# Patient Record
Sex: Female | Born: 1955 | Race: White | Hispanic: No | Marital: Married | Smoking: Never smoker
Health system: Southern US, Community
[De-identification: ages and names within clinical notes are randomized; demographics above are authoritative.]

## PROBLEM LIST (undated history)

## (undated) DIAGNOSIS — F319 Bipolar disorder, unspecified: Secondary | ICD-10-CM

## (undated) DIAGNOSIS — N189 Chronic kidney disease, unspecified: Secondary | ICD-10-CM

## (undated) DIAGNOSIS — F3010 Manic episode without psychotic symptoms, unspecified: Secondary | ICD-10-CM

## (undated) DIAGNOSIS — E119 Type 2 diabetes mellitus without complications: Secondary | ICD-10-CM

## (undated) DIAGNOSIS — E7800 Pure hypercholesterolemia, unspecified: Secondary | ICD-10-CM

---

## 2004-03-14 ENCOUNTER — Other Ambulatory Visit: Payer: Self-pay

## 2004-03-14 ENCOUNTER — Inpatient Hospital Stay: Payer: Self-pay | Admitting: Internal Medicine

## 2005-02-03 ENCOUNTER — Ambulatory Visit: Payer: Self-pay | Admitting: Internal Medicine

## 2005-02-26 ENCOUNTER — Ambulatory Visit: Payer: Self-pay | Admitting: Internal Medicine

## 2006-03-11 ENCOUNTER — Ambulatory Visit: Payer: Self-pay | Admitting: Internal Medicine

## 2007-03-23 ENCOUNTER — Ambulatory Visit: Payer: Self-pay | Admitting: Internal Medicine

## 2007-06-30 ENCOUNTER — Ambulatory Visit: Payer: Self-pay | Admitting: Urology

## 2007-12-16 ENCOUNTER — Ambulatory Visit: Payer: Self-pay | Admitting: Urology

## 2008-03-28 ENCOUNTER — Ambulatory Visit: Payer: Self-pay | Admitting: Internal Medicine

## 2008-09-06 ENCOUNTER — Ambulatory Visit: Payer: Self-pay | Admitting: Urology

## 2009-06-26 ENCOUNTER — Ambulatory Visit: Payer: Self-pay | Admitting: Internal Medicine

## 2009-10-10 ENCOUNTER — Ambulatory Visit: Payer: Self-pay | Admitting: Urology

## 2009-12-24 IMAGING — CT CT ABDOMEN AND PELVIS WITHOUT AND WITH CONTRAST
2 of 4 series · 14 of 32 positions shown, 19 images · non-contrast
Comparison: none

REASON FOR EXAM: Chronic cystitis, hematuria, renal colic
COMMENTS:

PROCEDURE:     CT  - CT ABDOMEN / PELVIS  W/WO  - June 30, 2007  [DATE]
RESULT:     The patient has a history of LEFT flank pain, cystitis and
hematuria.
TECHNIQUE: IV and oral contrast enhanced CT of abdomen and pelvis is
obtained.
There are no prior studies available for comparison.

[Series 4: soft tissue with · axial · 0.65mm/px · z∈[-902,-576]mm · 8 of 85 slices shown, 13 images]
[im 10/85  soft-tissue]
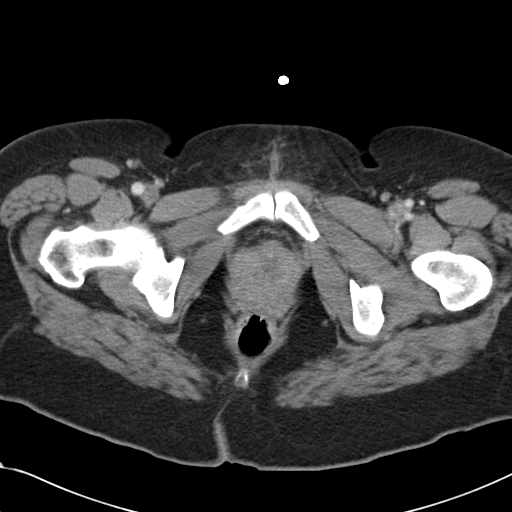
[im 10/85  bone]
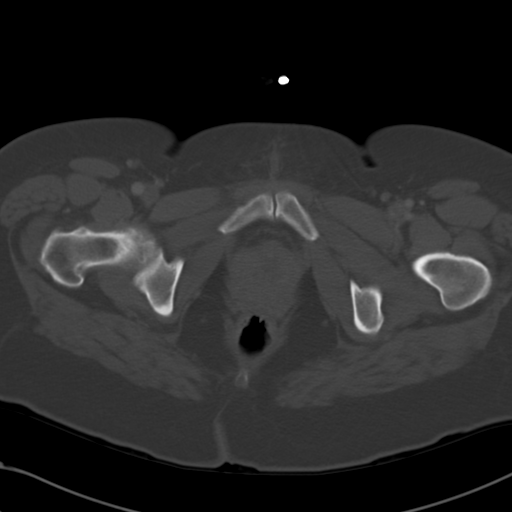
[im 19/85  soft-tissue]
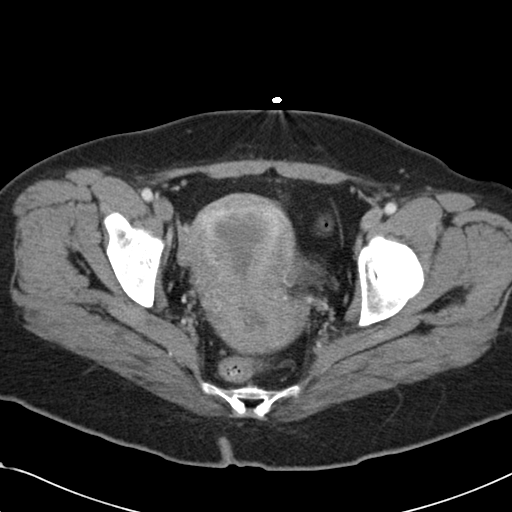
[im 29/85  soft-tissue]
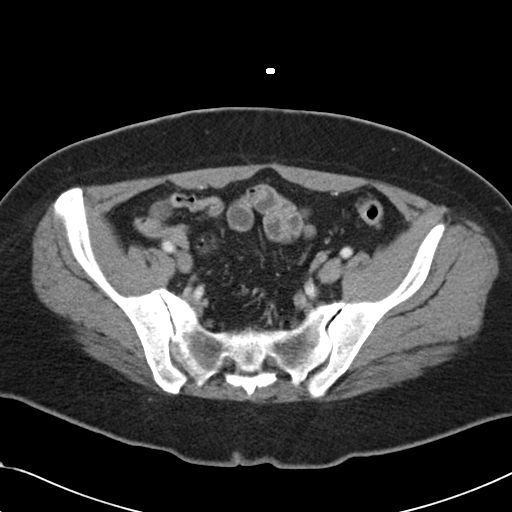
[im 38/85  soft-tissue]
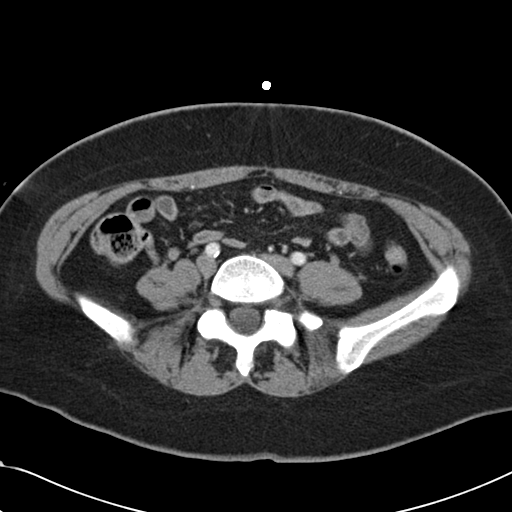
[im 47/85  soft-tissue]
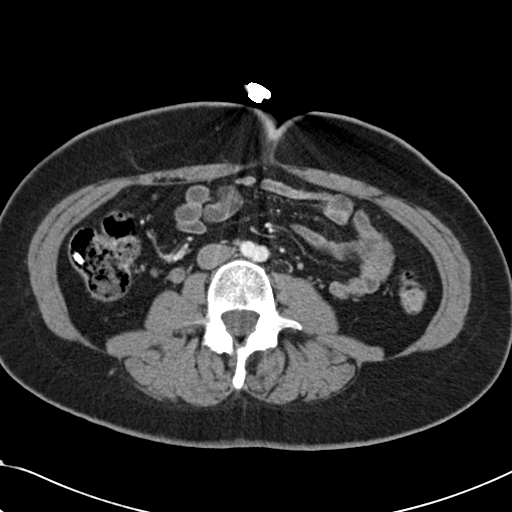
[im 47/85  lung]
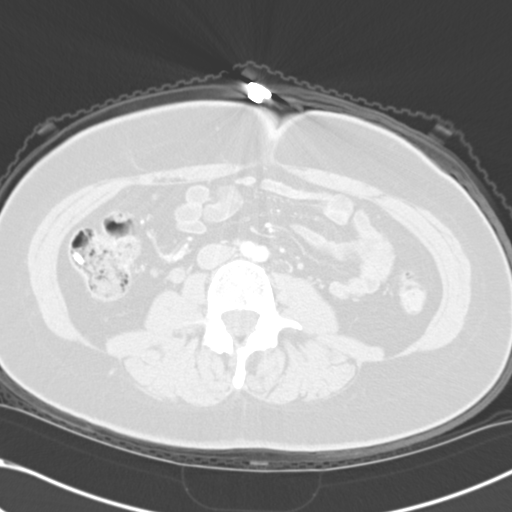
[im 57/85  soft-tissue]
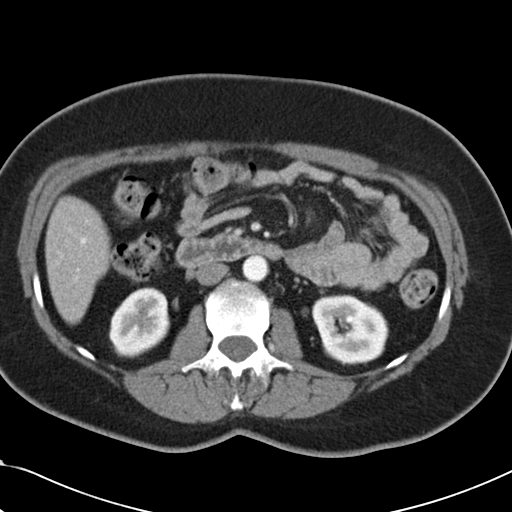
[im 57/85  lung]
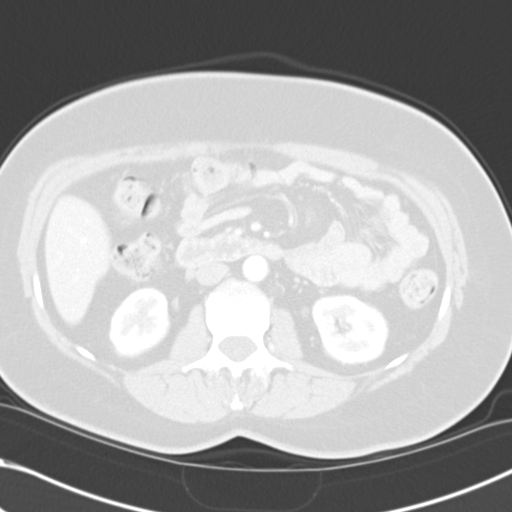
[im 66/85  soft-tissue]
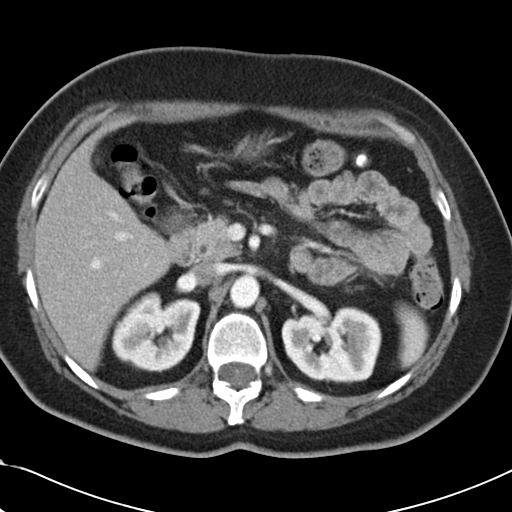
[im 66/85  lung]
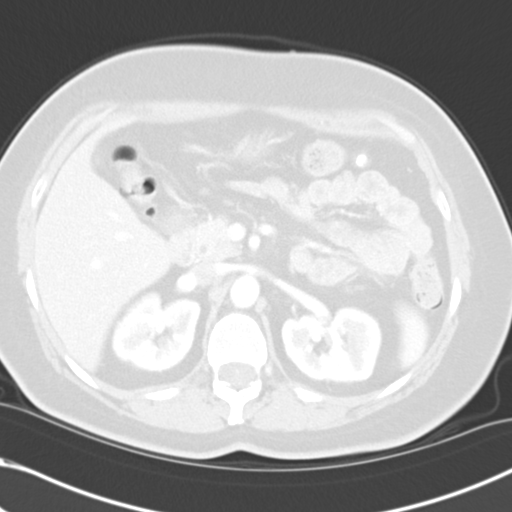
[im 75/85  soft-tissue]
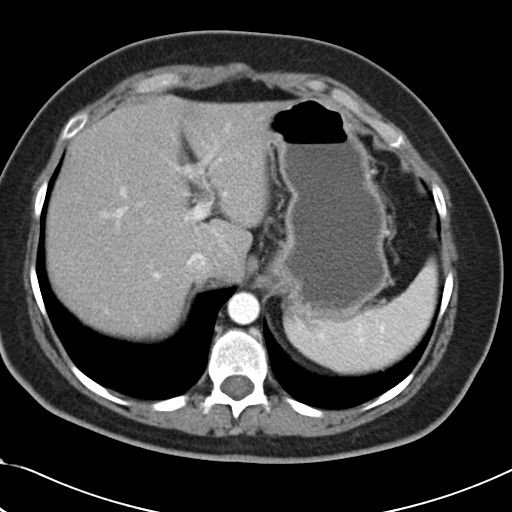
[im 75/85  lung]
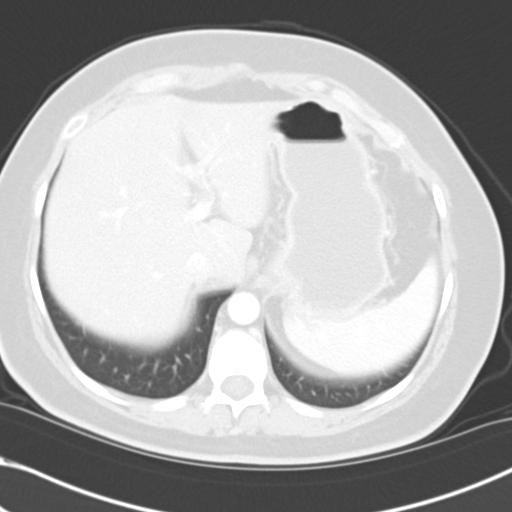

[Series 6: soft tissue delay · axial · delayed · 0.65mm/px · z∈[-902,-666]mm · 6 of 85 slices shown]
[im 10/85  soft-tissue]
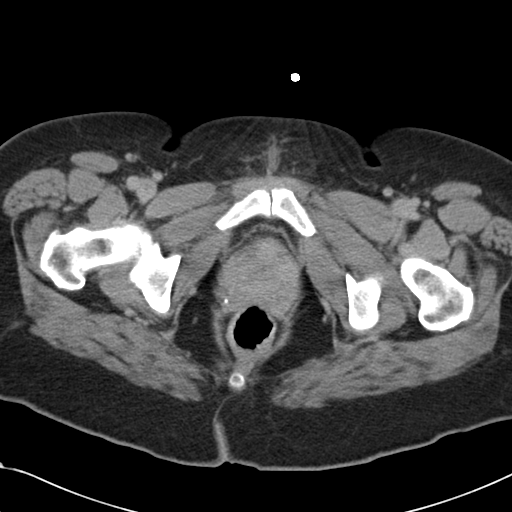
[im 19/85  soft-tissue]
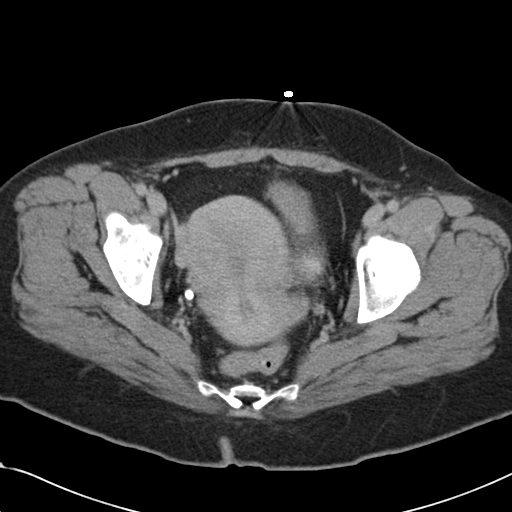
[im 29/85  soft-tissue]
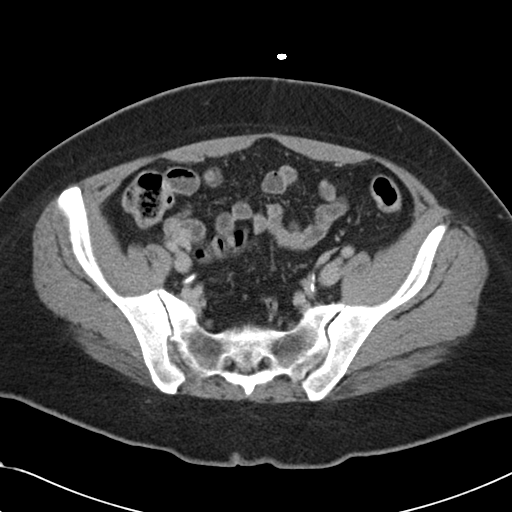
[im 38/85  soft-tissue]
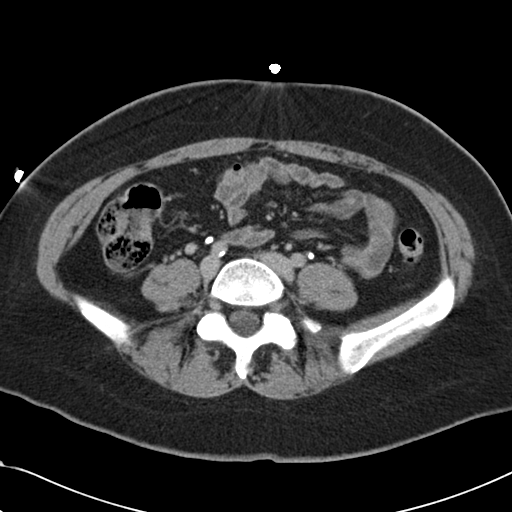
[im 47/85  soft-tissue]
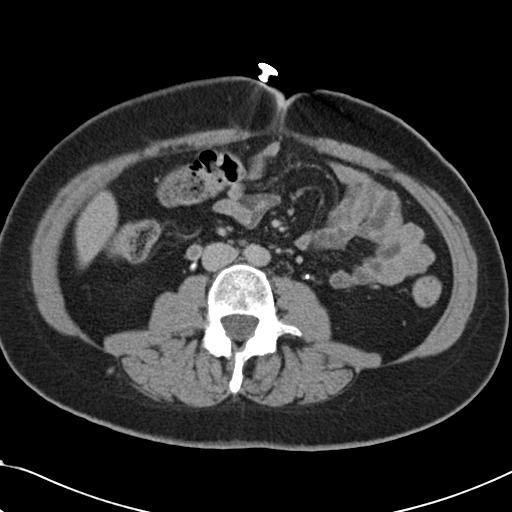
[im 57/85  soft-tissue]
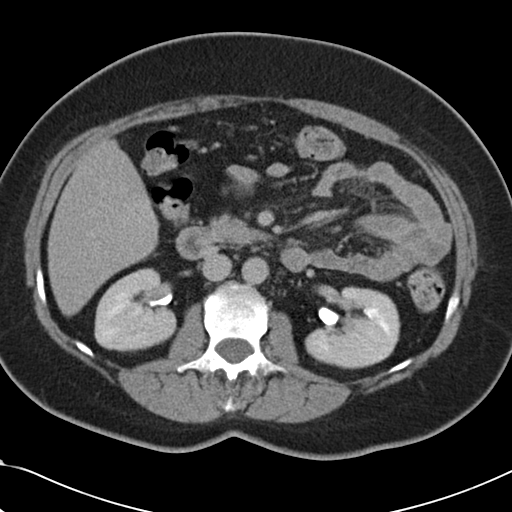

[14 of 32 positions shown; findings below may reference images not displayed]

FINDINGS: The liver and spleen are normal. The pancreas is normal. The
adrenals are normal. The kidneys are normal. There is no bowel distension.
The appendix is normal. Tiny, renal stones are present. There is no evidence
of obstructing ureteral stone. Calcified pelvic phleboliths are noted. The
cervix is noted. Fluid is noted within the uterus. Clinical evaluation is
suggested. Pelvic Ultrasound may prove useful. The lung bases are clear. No
free air is noted.
IMPRESSION: 1.     LEFT nephrolithiasis. Tiny stones are noted in the LEFT renal
collecting system.
2.     No focal renal parenchymal abnormality is identified.
3.     A tiny cyst is noted in the anterior aspect of the LEFT kidney.
4.     Enlargement of the uterus and cervix. Clinical correlation is
suggested. Pelvic Ultrasound can be obtained for further evaluation.
Clinical exam to evaluate for cervical cancer is suggested.
5.     The bladder is unremarkable.

## 2010-12-08 ENCOUNTER — Inpatient Hospital Stay: Payer: Self-pay | Admitting: Psychiatry

## 2011-03-03 IMAGING — CR DG ABDOMEN 1V
1 series · 1 of 1 positions shown · non-contrast
Comparison: none

REASON FOR EXAM: nephrolithasis pt need  pt need films
COMMENTS:

[view not recorded]
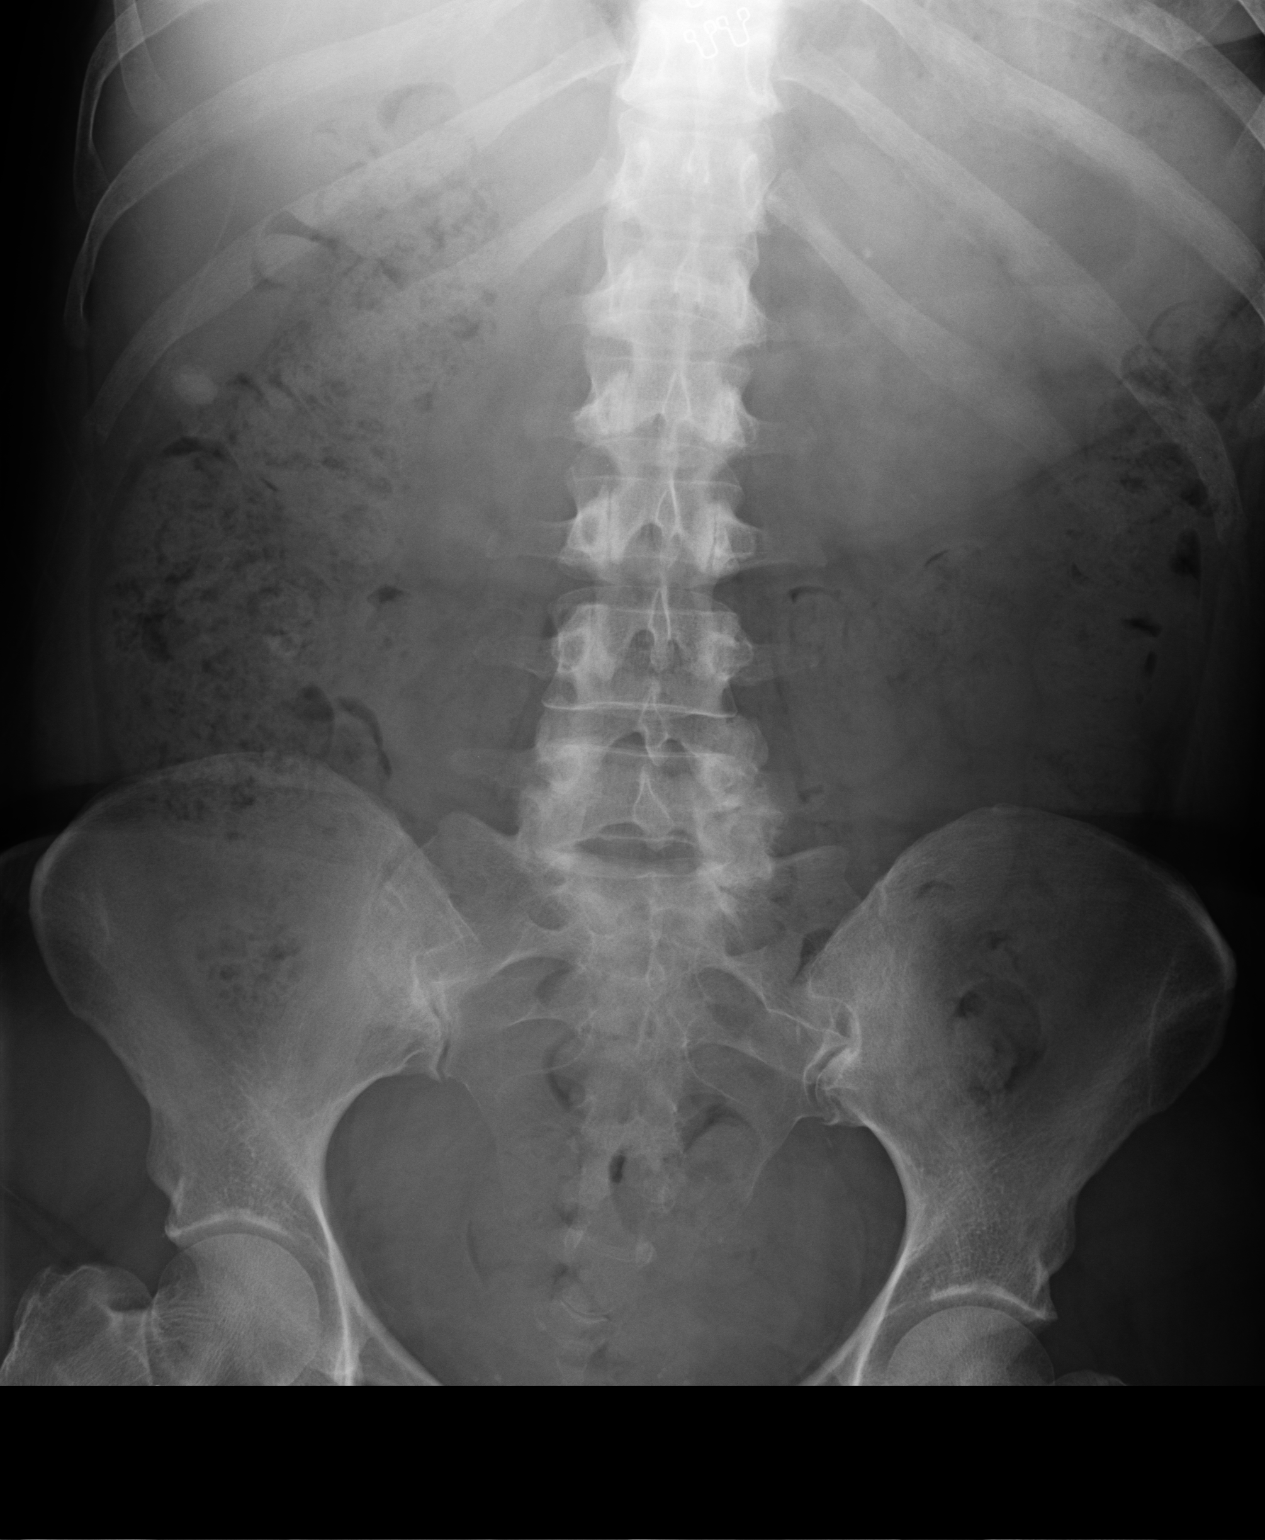

[1 of 1 positions shown; findings below may reference images not displayed]

PROCEDURE:     DXR - DXR KIDNEY URETER BLADDER  - September 06, 2008  [DATE]

RESULT:     An AP view of the abdomen was obtained and is compared to the
prior exam of 12/16/2007. There is again observed a 3 mm calcification
projected over the upper pole of the left kidney. Additional small left
renal stones were noted at prior CT and are not definitely identified by
routine radiography. No definite ureteral stones are identified. Phleboliths
are noted in the pelvis.
IMPRESSION: Left nephrolithiasis.

## 2012-01-05 ENCOUNTER — Ambulatory Visit: Payer: Self-pay | Admitting: Internal Medicine

## 2012-04-05 IMAGING — CR DG ABDOMEN 1V
1 series · 1 of 1 positions shown · non-contrast
Comparison: none

REASON FOR EXAM: Nephroklithiasis pt need films
COMMENTS:

[view not recorded]
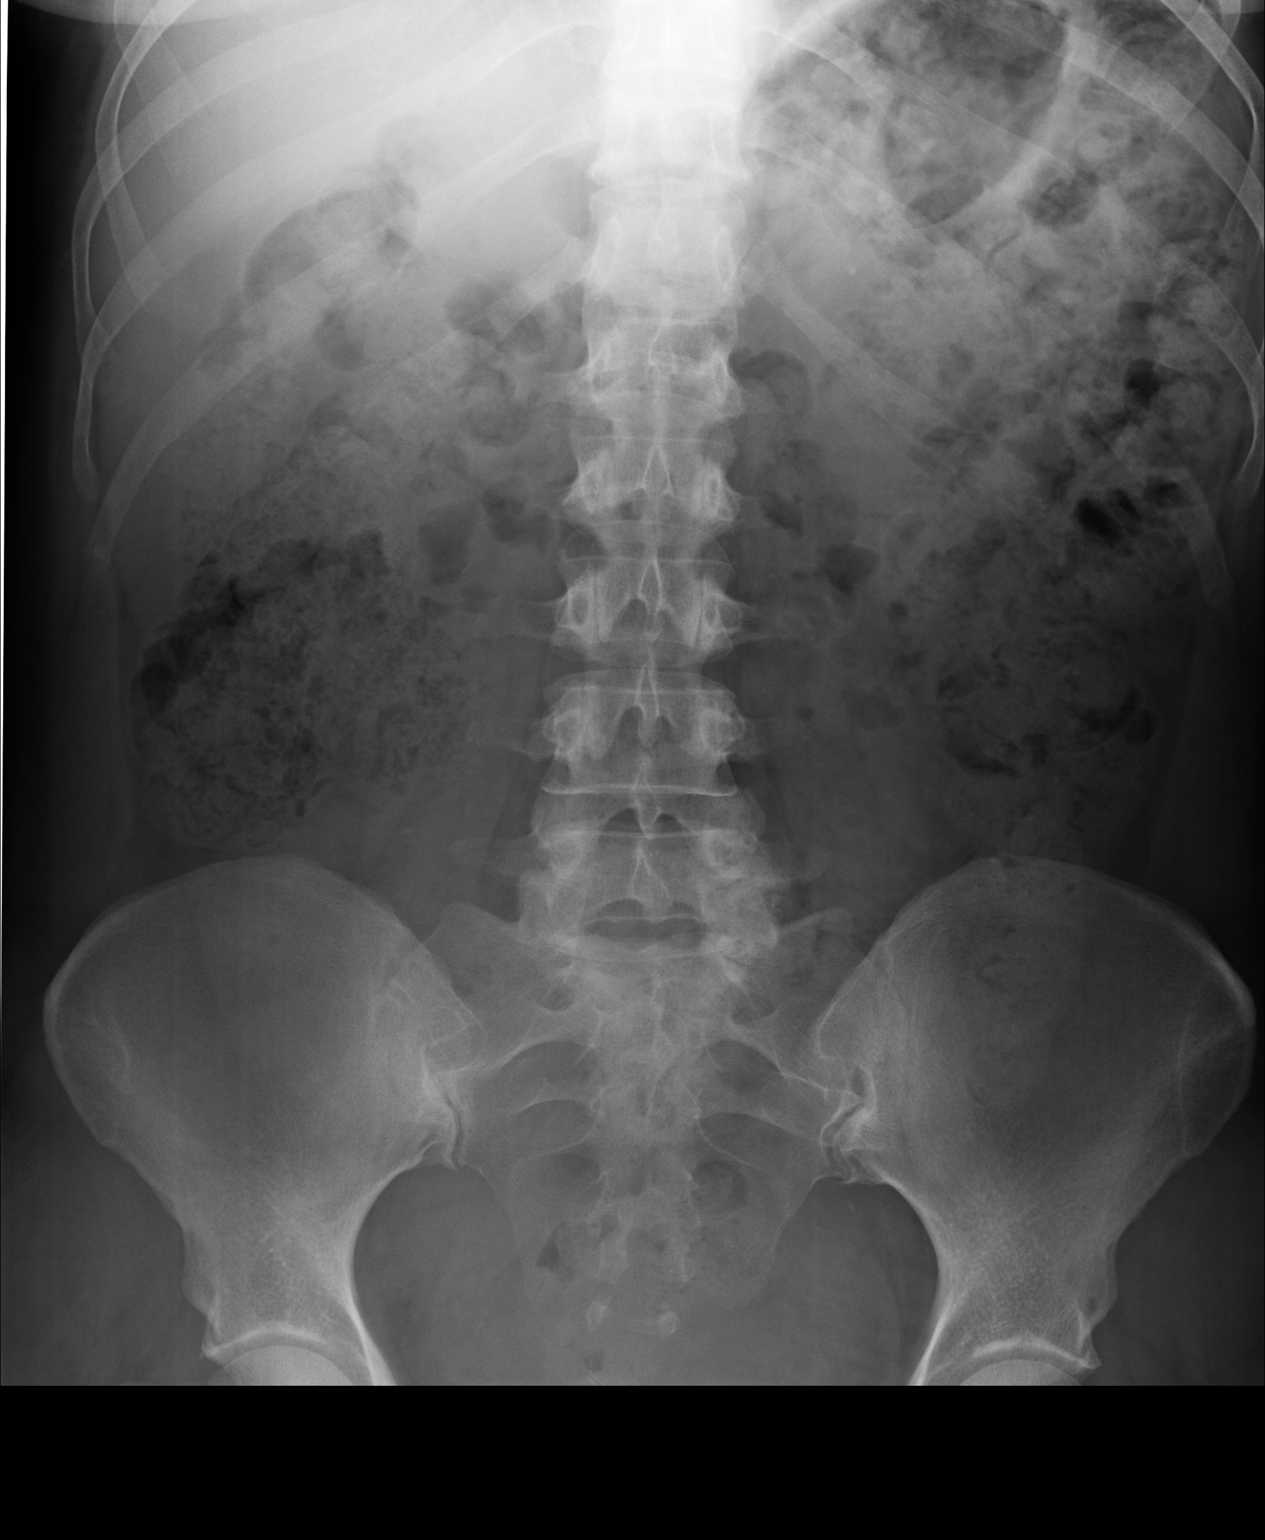

[1 of 1 positions shown; findings below may reference images not displayed]

PROCEDURE:     DXR - DXR KIDNEY URETER BLADDER  - October 10, 2009  [DATE]

RESULT:     There is a 2 mm calcification at the upper pole of the left
kidney consistent with a left renal stone and unchanged in position as
compared to the prior exam. No definite right renal stones are seen. No
ureteral calcifications are identified. Note is made that the right kidney
is in great part obscured by bowel and bowel content.
IMPRESSION: Left nephrolithiasis.

## 2013-01-05 ENCOUNTER — Ambulatory Visit: Payer: Self-pay | Admitting: Internal Medicine

## 2013-08-02 LAB — DRUG SCREEN, URINE

## 2013-08-02 LAB — CBC
HCT: 39.2 % (ref 35.0–47.0)
HGB: 13.5 g/dL (ref 12.0–16.0)
MCH: 33.8 pg (ref 26.0–34.0)
MCHC: 34.5 g/dL (ref 32.0–36.0)
MCV: 98 fL (ref 80–100)
Platelet: 277 10*3/uL (ref 150–440)
RBC: 4 10*6/uL (ref 3.80–5.20)
RDW: 12.3 % (ref 11.5–14.5)
WBC: 6.8 10*3/uL (ref 3.6–11.0)

## 2013-08-02 LAB — COMPREHENSIVE METABOLIC PANEL
Albumin: 3.7 g/dL (ref 3.4–5.0)
Alkaline Phosphatase: 101 U/L
Anion Gap: 9 (ref 7–16)
BILIRUBIN TOTAL: 0.1 mg/dL — AB (ref 0.2–1.0)
BUN: 14 mg/dL (ref 7–18)
CO2: 24 mmol/L (ref 21–32)
Calcium, Total: 9.6 mg/dL (ref 8.5–10.1)
Chloride: 106 mmol/L (ref 98–107)
Creatinine: 0.79 mg/dL (ref 0.60–1.30)
EGFR (African American): >60
EGFR (Non-African Amer.): >60
Glucose: 159 mg/dL — ABNORMAL HIGH (ref 65–99)
OSMOLALITY: 281 (ref 275–301)
Potassium: 4.1 mmol/L (ref 3.5–5.1)
SGOT(AST): 18 U/L (ref 15–37)
SGPT (ALT): 29 U/L (ref 12–78)
SODIUM: 139 mmol/L (ref 136–145)
TOTAL PROTEIN: 7.1 g/dL (ref 6.4–8.2)

## 2013-08-02 LAB — URINALYSIS, COMPLETE
BACTERIA: NONE SEEN
BILIRUBIN, UR: NEGATIVE
BLOOD: NEGATIVE
Glucose,UR: NEGATIVE mg/dL (ref 0–75)
KETONE: NEGATIVE
LEUKOCYTE ESTERASE: NEGATIVE
Nitrite: NEGATIVE
Ph: 5 (ref 4.5–8.0)
Protein: NEGATIVE
Specific Gravity: 1.006 (ref 1.003–1.030)

## 2013-08-02 LAB — ETHANOL
Ethanol %: 0.044 % (ref 0.000–0.080)
Ethanol: 44 mg/dL

## 2013-08-03 ENCOUNTER — Inpatient Hospital Stay: Payer: Self-pay | Admitting: Psychiatry

## 2014-06-26 ENCOUNTER — Ambulatory Visit: Payer: Self-pay | Admitting: Internal Medicine

## 2014-09-15 NOTE — Consult Note (Signed)
PATIENT NAME:  Rita Sullivan, Rita Sullivan MR#:  [id number] DATE OF BIRTH:  1955-09-09  DATE Of evaluation:  08/05/2013    CONSULTING PHYSICIAN:  Rita Sullivan. Rita Bundehalla, MD  PLACE OF DICTATION: [facility], [place].  SUBJECTIVE: The patient was seen in [facility]. The patient reported that she is feeling better. She is able to rest better and feels better and staff report that she has been calmer and cooperative too.  OBJECTIVE: Dressed in hospital scrubs. Alert and oriented, much calmer, pleasant and cooperative, able to focus much better. Speech is normal in rate. Denies feeling depressed and said "I am feeling fine, I am happy, that is what I am always". Smiling while talking about the same. She is planning to call her family members and talk to them and  she plans to do at the time of discharge and calls her husband a mean man and does want to go back home. Denies responding to any internal stimuli. Denies hearing voices saying things. Denies any paranoid or suspicious ideas. Denied any suicidal or homicidal plans.  Insight and judgment guarded.   IMPRESSION: Bipolar disorder, manic, and current episode being hypomanic.   PLAN: Continue current medications as the patient has been responding and feeling better. Supportive therapy and coping skills discussed. The patient plans to call her family and try to plan for discharge, as she does not want to go back to her husband.  ____________________________ Rita MantisSurya Sullivan. Rita Bundehalla, MD skc:sg D: 08/05/2013 22:23:36 ET T: 08/06/2013 13:32:23 ET JOB#: [id number]  cc: Rita SalkSurya Sullivan. Rita Bundehalla, MD, <Dictator> Rita FannySURYA Sullivan Rita Fortson MD ELECTRONICALLY SIGNED 08/06/2013 20:02

## 2014-09-15 NOTE — H&P (Signed)
PATIENT NAME:  Rita Sullivan, Rita Sullivan MR#:  [id number] DATE OF BIRTH:  [birth year]  DATE OF ADMISSION:  08/03/2013  AGE:  59. SEX: Female. RACE:  White.  INITIAL ASSESSMENT AND PSYCHIATRIC EVALUATION  IDENTIFYING INFORMATION:  The patient is a 58 year old white female, not employed, married for many years and has been living with her husband. The patient has a long history of mental illness and has bipolar disorder and has manic episodes.  She recently left home and drove to [place] in a manic episode.  Husband was worried and he did not know where she was.  Finally, he found her in [place] and she went to be with a family member. She reported she just went and could not give better reason.  Then, she reported that she was found at [facility], where she was taken by police on IVC and and brought here on IVC.  The patient was very casual in her reply when she said that.  Staff reports that the patient was brought here because she was having racing thoughts and could not focus and has irrational behaviors, which are normal to her when she gets into manic episodes, and she says it has happened several times.    CHIEF COMPLAINT:   "I came here for evaluation."    The patient was seen in  emergency room and was  recommended inpatient hospitalization to psychiatry.  PAST PSYCHIATRIC HISTORY:  The patient had many inpatient hospitalizations to    inpatient psychiatry and most of the inpatient hospitalizations were for manic episodes and bipolar disorder I with psychosis.   No history of suicide attempts. The patient is being followed with Dr. Omelia Blackwater in [place], [place], and the last appointment was in November 2014; next appointment is on 08/29/2013. She has been on the following medications:  Geodon 20 mg 5 capsules twice a day; that is 100 mg twice a day.    Seroquel 300 mg at bedtime, Tegretol 100 mg twice a day, zonisamide 100 mg 1 capsule in the morning, 2 at bedtime.  The patient reports that she  is noncompliant with her medications.    FAMILY HISTORY OF MENTAL ILLNESS:   No known H/O mental illness in the family. No H/o suicides in her family.  FAMILY HISTORY:  The patient was raised by her parents.  Father is an Art gallery manager. Father is living and is in touch with him "once in a while."  Mother is a Engineer, site.  Mother is living and is in touch with her.  Has 1 sister.  Not very close to family.  PERSONAL HISTORY:  The patient was [place]  Graduated from [employer] in [place].  Went to [employer].  Has a degree in Counselling psychologist.    WORK HISTORY:  Longest job was for 14 years.  Worked for [employer]  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  She last worked in 2006.  MILITARY HISTORY:  None.  MARRIAGES:  Married once, married for many years.  Her husband is a retired Art gallery manager.  She has 2 children; they are 67 and 41 years old.  She is in touch with them "sometimes."  ALCOHOL AND DRUGS:  Has an occasional drink of alcohol.  Denies street or prescription drug abuse.  Smokes occasional cigarettes in company, but not on a regular  basis.  MEDICAL HISTORY:  No known history of high blood pressure. No known diabetes mellitus.  No major surgery. No major injury. No history of motor vehicle accident.  Never been unconscious.  Allergic to PENICILLIN.   Being followed by Dr. Dareen Piano at [facility].  Last appointment was in November 2014.  Next appointment is to be made.  PHYSICAL EXAMINATION:  VITAL SIGNS:  Temperature is 98.4. Pulse is 80 per minute, regular.  Respirations 20 per minute, regular.  Blood pressure 118/70 mmHg. HEENT:  Head is normocephalic, atraumatic.  Eyes:  PERLA. Fundi are benign. EOMs intact.  Tympanic membranes:  No exudate is seen.   NECK: Supple without lymphadenopathy or thyromegaly. CHEST:  Normal expansion. Normal breath sounds. HEART: Normal S1 and S2 without any murmurs or rubs.   ABDOMEN:  Soft. No organomegaly.  Bowel sounds heard. RECTAL AND PELVIC:  Deferred. NEUROLOGIC:  Gait is normal.  Cranial nerves II through XII are grossly intact and normal. .   MENTAL STATUS EXAMINATION:  The patient is dressed in hospital scrubs.  Alert and oriented to place person and time.  Affect is appropriate to her mood which is expansive and hypomanic and said " I am very happy.".  Denies feeling depressed.  Denies feeling hopeless or helpless.  Speech is calm and not   pressured as it was in the Emergency Room when she was seen on 08/02/2013.  Staff report that she was seen masturbating, and when she was asked the reason, she reported that it makes her feel good and better.  Does not appear to be aware of bad behavior and surroundings.  Denies any auditory or visual hallucinations, delusions or paranoid ideas. She thinks that she has special powers, but she is not sure and laughed and said "Maybe I do."    denies any ideas or plans to hurt herself or others. Denies any ideas of hurting self   that she just feels like going and she goes wherever, and she is not aware of her behavior and the consequences of the same. She could count money and stated there are 4 quarters, 10 dimes, 20 nickels, 100 pennies in a dollar.  Cognition intact.  She knew the capital of [address], capital of [place], knew the current president.  Insight and judgement are guarded.  IMPRESSION:  Bipolar disorder type I, manic with psychosis type I, manic.   PLAN:  Continue current medications except the Geodon was increased to 120 mg twice a day and zonisamide was made 100 mg twice a day and Tegretol increased to 200 mg twice a day.  The patient was admitted to  [facility]  for close observation and help.  She will be started back on her medications with ncrease as stated above. During her stay in the hospital, she will be  given milieu therapy and supportive counseling.  She will take part in individual and group therapy, where coping skills when dealing with medical illness and behavioral aspects of mental illness will be addressed, and compliance issues will be addressed.  At the time of discharge, the patient will be stabilized and will have better insight into her problems and will have better coping skills when dealing with life stressors and mental illness.  Family counseling may be done by the social services. The patient will be discharged back to the community  with followup appointments with her doctors.      ____________________________ [name]. Guss Bunde, MD skc:dmm D: 08/03/2013 17:53:00 ET T:  08/03/2013 20:04:47 ET JOB#: [id number]  cc: Monika SalkSurya K. [name], MD, <Dictator> Beau FannySURYA K Zyra Parrillo MD ELECTRONICALLY SIGNED 08/04/2013 14:20

## 2014-09-15 NOTE — Consult Note (Signed)
PATIENT NAME:  Rita Sullivan, Rita Sullivan MR#:  [id number] DATE OF BIRTH:  01-Feb-1956  DATE OF evaluation:  08/04/2013   PHYSICIAN:  Rita Sullivan. Rita Deisher, MD  SUBJECTIVE: The patient was seen in behavioral health at [facility]. The patient is a 59 year old white female with a long history of bipolar disorder type I and has several manic episodes and currently she was admitted after having driven away to [place] for no reason. The patient today reports that she slept well last night and has participated in 3 group therapies and she feels that she learned enough from them. She reports that she does not want to go back to her husband because he is mean to her most of the time.   OBJECTIVE: The patient is dressed in hospital scrubs, alert and oriented, much calmer today. Able to focus better. Speech is normal in rate. Denies feeling depressed and said "I am opposite of depressed, what do you call it, happy, that is what I am." Smiling talking about the same. Today she reports "probably I don't have any special powers." Does not appear to be actively responding to internal stimuli, but still has thoughts that are racing and has ideas and plans to do things without knowing consequences, but admits that she slept well and feels much better and not as tired. Insight and judgment guarded.   IMPRESSION: Bipolar disorder, manic versus hypo-manic.  PLAN: Continue current medications. The patient is encouraged to participate in therapeutic activities. Social services is to discuss with her husband and family about discharge planning as she does not want to go back to her husband who is mean to her.  ____________________________ Rita MantisSurya Sullivan. Rita Bundehalla, MD skc:sb D: 08/04/2013 14:11:26 ET T: 08/04/2013 14:34:02 ET JOB#: [id number]  cc: Rita SalkSurya Sullivan. Rita Bundehalla, MD, <Dictator> Rita FannySURYA Sullivan Rita Casagrande MD ELECTRONICALLY SIGNED 08/05/2013 22:33

## 2014-09-15 NOTE — Consult Note (Signed)
PATIENT NAME:  Rita Sullivan, Rita K MR#:  [id number] DATE OF BIRTH:  [birth year]  AGE:  59 years  SEX:  Female  RACE:  White  DATE OF DICTATION: 08/02/2013  PLACE OF DICTATION:  [facility] [address], [place]  DATE OF CONSULTATION:  08/02/2013  CONSULTING PHYSICIAN:  Icey Tello K. Kenric Ginger, MD  SUBJECTIVE:  The patient is a 59 year old white female who is not employed, married for many years, and lives with her husband. The patient has a long history of mental illness and has bipolar disorder, and has manic episodes. Had several inpatient hospital psychiatry for her manic behavior, the last [place], according to her husband. The patient recently left her home and drove to [place] in a manic episode. Husband was worried, and he did not know where she was. Finally he found her in [place]. The patient reports that she was found at [facility], where she was taken by police on IDC.  The patient was very casual in her reply, and said "they thought that I needed help." "My husband brought me."  CHIEF COMPLAINT:  The patient reports "I came here for evaluation, brought here by police."  The patient is being followed by Dr. Girtha [place]akeHaden in [place], [place]. She relates well with him. She is on the following medications at this time: 1.  Geodon 20 mg 5 capsules twice daily, that is 100 mg p.o. b.i.d. 2.  Propranolol 20 mg 1 twice a day. 3.  Seroquel 300 mg at bedtime. 4.  Tegretol 100 mg 1 tablet twice a day. 5.  Zonisamide 100 mg 1 capsule in the morning and 2 at bedtime. The patient reports she has been compliant with medications. Last appointment with Dr. Girtha HakeHaden was in November 2014, next appointment is on 08/29/2013.  ALCOHOL AND DRUGS:  Has an occasional drink of  alcohol. Has an occasional smoke of nicotine cigarettes. Does not smoke on a regular basis.  OBJECTIVE:  The patient is seen lying in bed in hospital clothes. Alert and oriented. Appears to be manic  with rapid, pressured speech and racing thoughts, though she denied the same. Staff reports that she has been seen masturbating, and when she was asked why she was doing that, she reported "it makes me feel good." Denies any auditory or visual hallucinations. Denies feeling depressed. Denies being hopeless, helpless. Denies any ideas of suicide or homicide or plans Reports that she went to [place] because she just felt like it, though this was of concern to the family. Insight and judgment guarded versus impaired.  IMPRESSION:  Bipolar disorder, type manic, with psychosis.  PLAN:  Continue above medications, except Geodon was increased to 120 mg twice a day, and Zonisamide was made 100 mg twice a day, and Tegretol increased to 200 mg twice a day. Recommend inpatient hospital psychiatry when bed is available.    ____________________________ Jannet MantisSurya K. [name], MD skc:mr D: 08/02/2013 16:38:09 ET T: 08/02/2013 19:04:40 ET JOB#: [id number]  cc: Monika SalkSurya K. [name], MD, <Dictator> Beau FannySURYA K Leilany Digeronimo MD ELECTRONICALLY SIGNED 08/03/2013 8:22

## 2014-09-15 NOTE — Discharge Summary (Signed)
PATIENT NAME:  Rita Sullivan, Rita Sullivan#:  [id number] DATE OF BIRTH:  1955/07/26  DATE OF ADMISSION:  08/03/2013 DATE OF DISCHARGE:  08/09/2013  HOSPITAL COURSE: See dictated history and physical for details of admission. This 59 year old woman with a history of recent mania came into the hospital on her way back to her home after an extended manic trip that took her all the way down to [place]. The patient was still hypomanic and agitated when she first came into the hospital but mood settled down quite a bit. She was compliant with medication and showed appropriate insight. She was not aggressive or violent or dangerous. By the time of discharge, she appeared to be stable on her medicine and felt safe to go home, and her family felt comfortable with her doing so as well. She will be referred for followup outpatient treatment with Dr. Omelia BlackwaterHeaden. Her mood appeared to be stable. Blood pressure was stable.   MENTAL STATUS EXAM ON DISCHARGE: Neatly dressed and groomed woman, looks her stated age, cooperative with the interview. Good eye contact. Normal psychomotor activity. Speech normal rate, tone and volume. Affect euthymic, reactive, appropriate. Mood stated as okay. Thoughts are lucid without loosening of associations. Denies auditory or visual hallucinations. Denies suicidal or homicidal ideation. Shows good insight and judgment. Normal intelligence. Alert and oriented x 4. Short- and long-term memory grossly intact. Good fund of knowledge.   DISCHARGE MEDICATIONS: Carbamazepine 200 mg twice a day, zonisamide 100 mg twice a day, aspirin 81 mg per day, quetiapine 300 mg at night, ziprasidone 120 mg twice a day, diphenhydramine 25 mg at night, propranolol 20 mg twice a day, calcium 500 mg 1 tablet twice a day, multivitamin once a day, folic acid once a day 1 mg, ascorbic acid 500 mg once a day, vitamin D3 400 units once a day.   LABORATORY RESULTS: Admission labs included an alcohol level of 44. Chemistry panel:  Elevated glucose 159, bilirubin low at 0.1. Otherwise, chemistry panel normal. CBC unremarkable. Urinalysis normal.   DIAGNOSIS, PRINCIPAL AND PRIMARY:  AXIS I: Bipolar disorder, type 1, manic.   SECONDARY DIAGNOSES:  AXIS I: Alcohol abuse.  AXIS II: Deferred.  AXIS III: High blood pressure.  AXIS IV: Moderate to severe.  AXIS V: Functioning at time of discharge 56.   ____________________________ Audery AmelJohn T. Hagar Sadiq, MD jtc:gb D: 08/22/2013 22:52:31 ET T: 08/22/2013 23:37:47 ET JOB#: [id number]  cc: Audery AmelJohn T. Dhrithi Riche, MD, <Dictator> Audery AmelJOHN T Laurey Salser MD ELECTRONICALLY SIGNED 08/23/2013 0:09

## 2014-09-15 NOTE — Consult Note (Signed)
PATIENT NAME:  Rita Sullivan, Rita Sullivan MR#:  [id number] DATE OF BIRTH:  [birth year]  DATE OF CONSULTATION:  08/06/2013  REFERRING PHYSICIAN:   CONSULTING PHYSICIAN:  Rita Sullivan. Rita Bundehalla, MD  PLACE [facility] DICTATION: [facility] [place].  SUBJECTIVE: The patient was seen and she reports that she is feeling better. She tried to contact her son on 03/10, but she could not get a hold of him. She does not want to talk to her husband and does not want to go back to her husband and smiled when talking about the same.   OBJECTIVE: Dressed in hospital scrubs. Alert and oriented. Much calmer, pleasant and cooperative. She is able to focus much better. Speech is normal in rate. Denies feeling depressed and, in fact, she stated she is very happy and has hypomanic flavor to her thoughts and her speech. She is trying her best to contact her family, but not able to get in touch with them. Denies responding to internal stimuli. Denies hearing voices telling her anything. Denies any ideas of grandeur or having any special powers. However, she continues to have some grandeur flavor to her speech, but not in harming to self or others. Insight and judgment guarded.  IMPRESSION: Bipolar disorder, manic, current episode being hypomanic.  PLAN: Continue current medications. If the patient continues to stay stable and appears to be at baseline functioning, she can be considered for discharge in the next few days.  ____________________________ Rita MantisSurya Sullivan. Rita Bundehalla, MD skc:aw D: 08/06/2013 19:52:38 ET T: 08/07/2013 07:17:20 ET JOB#: [id number]  cc: Rita SalkSurya Sullivan. Rita Bundehalla, MD, <Dictator> Rita FannySURYA Sullivan Mata Rowen MD ELECTRONICALLY SIGNED 08/08/2013 20:23

## 2015-03-08 ENCOUNTER — Encounter: Payer: Self-pay | Admitting: *Deleted

## 2015-03-11 ENCOUNTER — Encounter: Payer: Self-pay | Admitting: *Deleted

## 2015-03-11 ENCOUNTER — Ambulatory Visit
Admission: RE | Admit: 2015-03-11 | Discharge: 2015-03-11 | Disposition: A | Discharge status: Home / Self Care | Payer: Medicare Other | Source: Ambulatory Visit | Attending: Gastroenterology | Admitting: Gastroenterology

## 2015-03-11 ENCOUNTER — Ambulatory Visit: Payer: Medicare Other | Admitting: Certified Registered Nurse Anesthetist

## 2015-03-11 ENCOUNTER — Encounter
Admission: RE | Disposition: A | Discharge status: Home / Self Care | Payer: Self-pay | Source: Ambulatory Visit | Attending: Gastroenterology

## 2015-03-11 DIAGNOSIS — Z79899 Other long term (current) drug therapy: Secondary | ICD-10-CM | POA: Insufficient documentation

## 2015-03-11 DIAGNOSIS — E7800 Pure hypercholesterolemia, unspecified: Secondary | ICD-10-CM | POA: Insufficient documentation

## 2015-03-11 DIAGNOSIS — I129 Hypertensive chronic kidney disease with stage 1 through stage 4 chronic kidney disease, or unspecified chronic kidney disease: Secondary | ICD-10-CM | POA: Insufficient documentation

## 2015-03-11 DIAGNOSIS — F319 Bipolar disorder, unspecified: Secondary | ICD-10-CM | POA: Diagnosis not present

## 2015-03-11 DIAGNOSIS — Z8371 Family history of colonic polyps: Secondary | ICD-10-CM | POA: Insufficient documentation

## 2015-03-11 DIAGNOSIS — Z1211 Encounter for screening for malignant neoplasm of colon: Secondary | ICD-10-CM | POA: Insufficient documentation

## 2015-03-11 DIAGNOSIS — E1122 Type 2 diabetes mellitus with diabetic chronic kidney disease: Secondary | ICD-10-CM | POA: Insufficient documentation

## 2015-03-11 DIAGNOSIS — N189 Chronic kidney disease, unspecified: Secondary | ICD-10-CM | POA: Insufficient documentation

## 2015-03-11 DIAGNOSIS — K5730 Diverticulosis of large intestine without perforation or abscess without bleeding: Secondary | ICD-10-CM | POA: Insufficient documentation

## 2015-03-11 HISTORY — DX: Bipolar disorder, unspecified: F31.9

## 2015-03-11 HISTORY — DX: Chronic kidney disease, unspecified: N18.9

## 2015-03-11 HISTORY — PX: COLONOSCOPY WITH PROPOFOL: SHX5780

## 2015-03-11 HISTORY — DX: Pure hypercholesterolemia, unspecified: E78.00

## 2015-03-11 HISTORY — DX: Type 2 diabetes mellitus without complications: E11.9

## 2015-03-11 LAB — GLUCOSE, CAPILLARY: Glucose-Capillary: 149 mg/dL — ABNORMAL HIGH (ref 65–99)

## 2015-03-11 SURGERY — COLONOSCOPY WITH PROPOFOL
Anesthesia: General

## 2015-03-11 MED ORDER — LIDOCAINE HCL (CARDIAC) 20 MG/ML IV SOLN
INTRAVENOUS | Status: DC | PRN
  Administered 2015-03-11: 40 mg via INTRAVENOUS

## 2015-03-11 MED ORDER — SODIUM CHLORIDE 0.9 % IV SOLN
INTRAVENOUS | Status: DC
  Administered 2015-03-11: 09:00:00 via INTRAVENOUS

## 2015-03-11 MED ORDER — PROPOFOL 10 MG/ML IV BOLUS
INTRAVENOUS | Status: DC | PRN
  Administered 2015-03-11: 40 mg via INTRAVENOUS

## 2015-03-11 MED ORDER — PROPOFOL 500 MG/50ML IV EMUL
INTRAVENOUS | Status: DC | PRN
  Administered 2015-03-11: 180 ug/kg/min via INTRAVENOUS

## 2015-03-11 NOTE — H&P (Signed)
    Primary Care Physician:  Lauro RegulusANDERSON,MARSHALL W., MD Primary Gastroenterologist:  Dr. Bluford Kaufmannh  Pre-Procedure History & Physical: HPI:  Rita Sullivan is a 59 Sullivan.o. female is here for an colonoscopy.   Past Medical History  Diagnosis Date  . Bipolar disorder (HCC)   . Hypercholesteremia   . Chronic kidney disease   . Diabetes mellitus without complication (HCC)     History reviewed. No pertinent past surgical history.  Prior to Admission medications   Medication Sig Start Date End Date Taking? Authorizing Provider  carbamazepine (TEGRETOL) 200 MG tablet Take 200 mg by mouth 3 (three) times daily.   Yes Historical Provider, MD  propranolol (INDERAL) 20 MG tablet Take 20 mg by mouth at bedtime.    Yes Historical Provider, MD  QUEtiapine (SEROQUEL) 300 MG tablet Take 300 mg by mouth at bedtime.   Yes Historical Provider, MD  rosuvastatin (CRESTOR) 40 MG tablet Take 40 mg by mouth daily.   Yes Historical Provider, MD  ziprasidone (GEODON) 60 MG capsule Take 120 mg by mouth daily.   Yes Historical Provider, MD  zonisamide (ZONEGRAN) 100 MG capsule Take 200 mg by mouth daily.   Yes Historical Provider, MD    Allergies as of 01/30/2015  . (Not on File)    History reviewed. No pertinent family history.  Social History   Social History  . Marital Status: Married    Spouse Name: N/A  . Number of Children: N/A  . Years of Education: N/A   Occupational History  . Not on file.   Social History Main Topics  . Smoking status: Never Smoker   . Smokeless tobacco: Never Used  . Alcohol Use: No  . Drug Use: Not on file  . Sexual Activity: Not on file   Other Topics Concern  . Not on file   Social History Narrative    Review of Systems: See HPI, otherwise negative ROS  Physical Exam: BP 106/71 mmHg  Pulse 77  Temp(Src) 98.4 F (36.9 C) (Tympanic)  Resp 10  Ht 5\' 2"  (1.575 m)  Wt 79.833 kg (176 lb)  BMI 32.18 kg/m2  SpO2 98% General:   Alert,  pleasant and cooperative in  NAD Head:  Normocephalic and atraumatic. Neck:  Supple; no masses or thyromegaly. Lungs:  Clear throughout to auscultation.    Heart:  Regular rate and rhythm. Abdomen:  Soft, nontender and nondistended. Normal bowel sounds, without guarding, and without rebound.   Neurologic:  Alert and  oriented x4;  grossly normal neurologically.  Impression/Plan: Rita Sullivan is here for an colonoscopy to be performed for screening and family hx of colon polyps.  Risks, benefits, limitations, and alternatives regarding colonoscopy have been reviewed with the patient.  Questions have been answered.  All parties agreeable.   Rita Sullivan, Rita StandingPAUL Y, MD  03/11/2015, 8:37 AM

## 2015-03-11 NOTE — Anesthesia Postprocedure Evaluation (Signed)
  Anesthesia Post-op Note  Patient: Rita Sullivan  Procedure(s) Performed: Procedure(s): COLONOSCOPY WITH PROPOFOL (N/A)  Anesthesia type:General  Patient location: PACU  Post pain: Pain level controlled  Post assessment: Post-op Vital signs reviewed, Patient's Cardiovascular Status Stable, Respiratory Function Stable, Patent Airway and No signs of Nausea or vomiting  Post vital signs: Reviewed and stable  Last Vitals:  Filed Vitals:   03/11/15 0940  BP: 92/62  Pulse: 64  Temp:   Resp: 14    Level of consciousness: awake, alert  and patient cooperative  Complications: No apparent anesthesia complications

## 2015-03-11 NOTE — Transfer of Care (Signed)
Immediate Anesthesia Transfer of Care Note  Patient: Rita ChristmasLaura K Nauman  Procedure(s) Performed: Procedure(s): COLONOSCOPY WITH PROPOFOL (N/A)  Patient Location: PACU  Anesthesia Type:General  Level of Consciousness: sedated  Airway & Oxygen Therapy: Patient Spontanous Breathing and Patient connected to nasal cannula oxygen  Post-op Assessment: Report given to RN and Post -op Vital signs reviewed and stable  Post vital signs: Reviewed and stable  Last Vitals:  Filed Vitals:   03/11/15 0823  BP: 106/71  Pulse: 77  Temp: 36.9 C  Resp: 10    Complications: No apparent anesthesia complications

## 2015-03-11 NOTE — Anesthesia Procedure Notes (Signed)
Performed by: Keisha Amer Pre-anesthesia Checklist: Patient identified, Emergency Drugs available, Suction available, Patient being monitored and Timeout performed Patient Re-evaluated:Patient Re-evaluated prior to inductionOxygen Delivery Method: Nasal cannula     

## 2015-03-11 NOTE — Anesthesia Preprocedure Evaluation (Signed)
Anesthesia Evaluation  Patient identified by MRN, date of birth, ID band Patient awake    Reviewed: Allergy & Precautions, NPO status , Patient's Chart, lab work & pertinent test results  History of Anesthesia Complications Negative for: history of anesthetic complications  Airway Mallampati: III       Dental  (+) Teeth Intact   Pulmonary neg pulmonary ROS,           Cardiovascular hypertension, Pt. on home beta blockers negative cardio ROS       Neuro/Psych Bipolar Disorder negative neurological ROS     GI/Hepatic negative GI ROS, Neg liver ROS,   Endo/Other  negative endocrine ROSdiabetes, Well Controlled  Renal/GU Renal InsufficiencyRenal disease (pt denies)     Musculoskeletal   Abdominal   Peds  Hematology negative hematology ROS (+)   Anesthesia Other Findings   Reproductive/Obstetrics                             Anesthesia Physical Anesthesia Plan  ASA: III  Anesthesia Plan: General   Post-op Pain Management:    Induction: Intravenous  Airway Management Planned: Nasal Cannula  Additional Equipment:   Intra-op Plan:   Post-operative Plan:   Informed Consent: I have reviewed the patients History and Physical, chart, labs and discussed the procedure including the risks, benefits and alternatives for the proposed anesthesia with the patient or authorized representative who has indicated his/her understanding and acceptance.     Plan Discussed with:   Anesthesia Plan Comments:         Anesthesia Quick Evaluation

## 2015-03-11 NOTE — Op Note (Signed)
[facility] Gastroenterology Patient Name: Rita EdgeLaura Sullivan Procedure Date: 03/11/2015 9:13 AM MRN: [id number] Account #: [id number] Date of Birth: 12/14/1955 Admit Type: Outpatient Age: 59 Room[facility] ROOM 4 Gender: Female Note Status: Finalized Procedure:         Colonoscopy Indications:       Colon cancer screening in patient at increased risk:                     Family history of colon polyps Providers:         Ezzard StandingPaul Y. Bluford Kaufmannh, MD Referring MD:      Marya AmslerMarshall W. Dareen PianoAnderson, MD (Referring MD) Medicines:         Monitored Anesthesia Care Complications:     No immediate complications. Procedure:         Pre-Anesthesia Assessment:                    - Prior to the procedure, a History and Physical was                     performed, and patient medications, allergies and                     sensitivities were reviewed. The patient's tolerance of                     previous anesthesia was reviewed.                    - The risks and benefits of the procedure and the sedation                     options and risks were discussed with the patient. All                     questions were answered and informed consent was obtained.                    - After reviewing the risks and benefits, the patient was                     deemed in satisfactory condition to undergo the procedure.                    After obtaining informed consent, the colonoscope was                     passed under direct vision. Throughout the procedure, the                     patient's blood pressure, pulse, and oxygen saturations                     were monitored continuously. The Colonoscope was                     introduced through the anus and advanced to the the cecum,                     identified by appendiceal orifice and ileocecal valve. The                     colonoscopy was performed without difficulty. The patient  tolerated the procedure well. The quality of the  bowel                     preparation was fair. Findings:      A few small-mouthed diverticula were found in the sigmoid colon.      The exam was otherwise without abnormality. Impression:        - Diverticulosis in the sigmoid colon.                    - The examination was otherwise normal.                    - No specimens collected. Recommendation:    - Discharge patient to home.                    - Repeat colonoscopy in 5 years for surveillance.                    - The findings and recommendations were discussed with the                     patient. Procedure Code(s): --- Professional ---                    [id number], Colonoscopy, flexible; diagnostic, including                     collection of specimen(s) by brushing or washing, when                     performed (separate procedure) Diagnosis Code(s): --- Professional ---                    Z83.71, Family history of colonic polyps                    K57.30, Diverticulosis of large intestine without                     perforation or abscess without bleeding CPT copyright 2014 American Medical Association. All rights reserved. The codes documented in this report are preliminary and upon coder review may  be revised to meet current compliance requirements. Wallace Cullens, MD 03/11/2015 9:32:28 AM This report has been signed electronically. Number of Addenda: 0 Note Initiated On: 03/11/2015 9:13 AM Scope Withdrawal Time: 0 hours 8 minutes 12 seconds  Total Procedure Duration: 0 hours 13 minutes 43 seconds       [facility]

## 2015-03-13 ENCOUNTER — Encounter: Payer: Self-pay | Admitting: Gastroenterology

## 2015-04-10 ENCOUNTER — Emergency Department
Admission: EM | Admit: 2015-04-10 | Discharge: 2015-04-11 | Disposition: A | Discharge status: Other Acute Inpatient Hospital | Payer: Medicare Other | Attending: Emergency Medicine | Admitting: Emergency Medicine

## 2015-04-10 ENCOUNTER — Encounter: Payer: Self-pay | Admitting: Emergency Medicine

## 2015-04-10 DIAGNOSIS — F310 Bipolar disorder, current episode hypomanic: Secondary | ICD-10-CM

## 2015-04-10 DIAGNOSIS — Z880 Allergy status to penicillin: Secondary | ICD-10-CM | POA: Insufficient documentation

## 2015-04-10 DIAGNOSIS — R440 Auditory hallucinations: Secondary | ICD-10-CM | POA: Diagnosis not present

## 2015-04-10 DIAGNOSIS — E119 Type 2 diabetes mellitus without complications: Secondary | ICD-10-CM | POA: Diagnosis not present

## 2015-04-10 DIAGNOSIS — F309 Manic episode, unspecified: Secondary | ICD-10-CM | POA: Diagnosis present

## 2015-04-10 HISTORY — DX: Manic episode without psychotic symptoms, unspecified: F30.10

## 2015-04-10 LAB — COMPREHENSIVE METABOLIC PANEL
ALBUMIN: 4.8 g/dL (ref 3.5–5.0)
ALT: 28 U/L (ref 14–54)
AST: 29 U/L (ref 15–41)
Alkaline Phosphatase: 143 U/L — ABNORMAL HIGH (ref 38–126)
Anion gap: 9 (ref 5–15)
BILIRUBIN TOTAL: 0.6 mg/dL (ref 0.3–1.2)
BUN: 26 mg/dL — AB (ref 6–20)
CHLORIDE: 108 mmol/L (ref 101–111)
CO2: 19 mmol/L — ABNORMAL LOW (ref 22–32)
Calcium: 10.1 mg/dL (ref 8.9–10.3)
Creatinine, Ser: 0.95 mg/dL (ref 0.44–1.00)
GFR calc Af Amer: >60 mL/min (ref >60)
GFR calc non Af Amer: >60 mL/min (ref >60)
GLUCOSE: 185 mg/dL — AB (ref 65–99)
POTASSIUM: 4.3 mmol/L (ref 3.5–5.1)
Sodium: 136 mmol/L (ref 135–145)
TOTAL PROTEIN: 7.9 g/dL (ref 6.5–8.1)

## 2015-04-10 LAB — CBC
HEMATOCRIT: 44.3 % (ref 35.0–47.0)
Hemoglobin: 15.1 g/dL (ref 12.0–16.0)
MCH: 31.4 pg (ref 26.0–34.0)
MCHC: 34.2 g/dL (ref 32.0–36.0)
MCV: 91.8 fL (ref 80.0–100.0)
Platelets: 335 10*3/uL (ref 150–440)
RBC: 4.83 MIL/uL (ref 3.80–5.20)
RDW: 12.6 % (ref 11.5–14.5)
WBC: 12.6 10*3/uL — ABNORMAL HIGH (ref 3.6–11.0)

## 2015-04-10 LAB — URINE DRUG SCREEN, QUALITATIVE (ARMC ONLY)
Amphetamines, Ur Screen: NOT DETECTED
BARBITURATES, UR SCREEN: NOT DETECTED
BENZODIAZEPINE, UR SCRN: NOT DETECTED
CANNABINOID 50 NG, UR ~~LOC~~: NOT DETECTED
Cocaine Metabolite,Ur ~~LOC~~: NOT DETECTED
MDMA (Ecstasy)Ur Screen: NOT DETECTED
Methadone Scn, Ur: NOT DETECTED
OPIATE, UR SCREEN: NOT DETECTED
PHENCYCLIDINE (PCP) UR S: NOT DETECTED
Tricyclic, Ur Screen: NOT DETECTED

## 2015-04-10 NOTE — ED Notes (Signed)
Pt. Noted in room sleeping;. No complaints or concerns voiced. No distress or abnormal behavior noted. Will continue to monitor with security cameras. Q 15 minute rounds continue. 

## 2015-04-10 NOTE — ED Notes (Signed)
Snack and beverage given. 

## 2015-04-10 NOTE — ED Provider Notes (Signed)
[facility] Emergency Department Provider Note     Time seen: ----------------------------------------- 2:03 PM on 04/10/2015 -----------------------------------------    I have reviewed the triage vital signs and the nursing notes.   HISTORY  Chief Complaint Manic Behavior    HPI Rita Sullivan is a 59 y.o. female who presents to ER for manic behavior. Patient states she heard a nuclear bomb was coming tonight [facility]. Patient states she's getting prepared for nuclear bomb, she states voices were telling her this was about to happen. She is not having voices telling her to hurt herself or anyone else, she is worried that the voices will tell her something harmful and wants to be admitted.   Past Medical History  Diagnosis Date  . Bipolar disorder (HCC)   . Hypercholesteremia   . Chronic kidney disease   . Diabetes mellitus without complication (HCC)   . Manic behavior (HCC)     There are no active problems to display for this patient.   Past Surgical History  Procedure Laterality Date  . Colonoscopy with propofol N/A 03/11/2015    Procedure: COLONOSCOPY WITH PROPOFOL;  Surgeon: Wallace CullensPaul Y Oh, MD;  [facility];  Service: Gastroenterology;  Laterality: N/A;    Allergies Divalproex sodium and Penicillins  Social History Social History  Substance Use Topics  . Smoking status: Never Smoker   . Smokeless tobacco: Never Used  . Alcohol Use: No    Review of Systems Constitutional: Negative for fever. Eyes: Negative for visual changes. ENT: Negative for sore throat. Cardiovascular: Negative for chest pain. Respiratory: Negative for shortness of breath. Gastrointestinal: Negative for abdominal pain, vomiting and diarrhea. Genitourinary: Negative for dysuria. Musculoskeletal: Negative for back pain. Skin: Negative for rash. Neurological: Negative for headaches, focal weakness or  numbness. Psychiatric: Positive for auditory hallucinations  10-point ROS otherwise negative.  ____________________________________________   PHYSICAL EXAM:  VITAL SIGNS: ED Triage Vitals  Enc Vitals Group     BP 04/10/15 1321 106/74 mmHg     Pulse Rate 04/10/15 1321 101     Resp 04/10/15 1321 18     Temp 04/10/15 1326 98.6 F (37 C)     Temp Source 04/10/15 1326 Oral     SpO2 04/10/15 1321 94 %     Weight 04/10/15 1321 180 lb (81.647 kg)     Height 04/10/15 1321 5\' 2"  (1.575 m)     Head Cir --      Peak Flow --      Pain Score --      Pain Loc --      Pain Edu? --      Excl. in GC? --     Constitutional: Alert and oriented. Well appearing and in no distress. Eyes: Conjunctivae are normal. PERRL. Normal extraocular movements. ENT   Head: Normocephalic and atraumatic.   Nose: No congestion/rhinnorhea.   Mouth/Throat: Mucous membranes are moist.   Neck: No stridor. Cardiovascular: Normal rate, regular rhythm. Normal and symmetric distal pulses are present in all extremities. No murmurs, rubs, or gallops. Respiratory: Normal respiratory effort without tachypnea nor retractions. Breath sounds are clear and equal bilaterally. No wheezes/rales/rhonchi. Gastrointestinal: Soft and nontender. No distention. No abdominal bruits.  Musculoskeletal: Nontender with normal range of motion in all extremities. No joint effusions.  No lower extremity tenderness nor edema. Neurologic:  Normal speech and language. No gross focal neurologic deficits are appreciated. Speech is normal. No gait instability. Skin:  Skin is warm, dry and intact. No rash noted. Psychiatric: Mood and affect are normal. Patient states she is having auditory hallucinations. ___________________________________________  ED COURSE:  Pertinent labs & imaging results that were available during my care of the patient were reviewed by me and considered in my medical decision making (see chart for  details). Patients in no acute distress, will check basic labs and consult psychiatry. ____________________________________________    LABS (pertinent positives/negatives)  Labs Reviewed  CBC - Abnormal; Notable for the following:    WBC 12.6 (*)    All other components within normal limits  COMPREHENSIVE METABOLIC PANEL  URINE DRUG SCREEN, QUALITATIVE ([facility] ONLY)   ____________________________________________  FINAL ASSESSMENT AND PLAN  Auditory hallucinations, bipolar disorder  Plan: Patient with labs and imaging as dictated above. Patient with hallucination and bipolar behavior. Medically stable, is pending psychiatric disposition.   Emily Filbert, MD   Emily Filbert, MD 04/10/15 [phone]

## 2015-04-10 NOTE — ED Notes (Signed)
Pt. Noted in  room watching the tv.;. No complaints or concerns voiced. No distress or abnormal behavior noted. Will continue to monitor with security cameras. Q 15 minute rounds continue. 

## 2015-04-10 NOTE — ED Notes (Signed)
Report received from Amy B., RN. Pt. Alert and oriented in no distress denies SI, HI, AVH and pain.  Pt. Instructed to come to me with problems or concerns.Will continue to monitor for safety via security cameras and Q 15 minute checks. 

## 2015-04-10 NOTE — ED Notes (Signed)
Per request of pt, husband will take all belongings home with him. Husband states last time she was here, her clothes and shoes were lost.

## 2015-04-10 NOTE — BH Assessment (Signed)
Assessment Note  Rita Sullivan is an 59 y.o. female who presents to ER due to having the belief that their is going to be a nuclear bombing tonight. "I need to go to [employer] and buy all the beans because it helps with the nuclear radio active.  This was told to me by the voices are in my head. I don't want this is happing to me anymore. The beans help with radio active. My husband going to die and my dog."  She further explains, the voices increase when she is stressed.When asked about the stress she is under, she was unable to share what it was.  Per ER staff, the patient was in [employer] with two buggies, full of beans and meat.  She is currently being followed by Dr. Omelia BlackwaterHeaden (Psych MD), with [facility].  At times, the patient speech was pressured and would have thought blocking. She denies SI/HI and endorses AV/H.  Patient lives with her husband and they are the only two in the home.  Diagnosis: Bipolar  Past Medical History:  Past Medical History  Diagnosis Date  . Bipolar disorder (HCC)   . Hypercholesteremia   . Chronic kidney disease   . Diabetes mellitus without complication (HCC)   . Manic behavior (HCC)     Past Surgical History  Procedure Laterality Date  . Colonoscopy with propofol N/A 03/11/2015    Procedure: COLONOSCOPY WITH PROPOFOL;  Surgeon: Wallace CullensPaul Y Oh, MD;  [facility];  Service: Gastroenterology;  Laterality: N/A;    Family History: No family history on file.  Social History:  reports that she has never smoked. She has never used smokeless tobacco. She reports that she does not drink alcohol. Her drug history is not on file.  Additional Social History:  Alcohol / Drug Use Pain Medications: See PTA Prescriptions: See PTA Over the Counter: See PTA History of alcohol / drug use?: No history of alcohol / drug abuse Longest period of sobriety (when/how long):  (No abused reported) Negative Consequences of Use:  (No abused reported) Withdrawal  Symptoms:  (No abused reported)  CIWA: CIWA-Ar BP: 106/74 mmHg Pulse Rate: (!) 101 COWS:    Allergies:  Allergies  Allergen Reactions  . Divalproex Sodium   . Penicillins     Home Medications:  (Not in a hospital admission)  OB/GYN Status:  No LMP recorded. Patient is not currently having periods (Reason: Premenopausal).  General Assessment Data [facility] Assessment: In system Is this a Tele or Face-to-Face Assessment?: Face-to-Face Is this an Initial Assessment or a Re-assessment for this encounter?: Initial Assessment Marital status: Married [place] name: Claudette LawsWatson Is patient pregnant?: No Pregnancy Status: No Living Arrangements: Spouse/significant other Can pt return to current living arrangement?: Yes Admission Status: Voluntary Is patient capable of signing voluntary admission?: Yes Referral Source: Self/Family/Friend Insurance type: Medicare  Medical Screening Exam [facility] Walk-in ONLY) Medical Exam completed: Yes  Crisis Care Plan Living Arrangements: Spouse/significant other Name of Psychiatrist: Dr. Omelia BlackwaterHeaden Name of Therapist: None Reported  Education Status Is patient currently in school?: No Current Grade: n/a Highest grade of school patient has completed: Bachelor Water quality scientist(Chemical Engineering ) Name of school: [employer] Contact person: n/a  Risk to self with the past 6 months Suicidal Ideation: No Has patient been a risk to self within the past 6 months prior to admission? : No Suicidal Intent: No Has patient had any suicidal intent within the past 6 months prior to admission? :  No Is patient at risk for suicide?: No Suicidal Plan?: No Has patient had any suicidal plan within the past 6 months prior to admission? : No Access to Means: No What has been your use of drugs/alcohol within the last 12 months?: No abuse reported Previous Attempts/Gestures: No Other Self Harm Risks: None Reported Triggers for Past Attempts: None  known Intentional Self Injurious Behavior: None Family Suicide History: No Recent stressful life event(s): Other (Comment) Persecutory voices/beliefs?: No Depression: No Depression Symptoms:  (None noted) Substance abuse history and/or treatment for substance abuse?: No Suicide prevention information given to non-admitted patients: Not applicable  Risk to Others within the past 6 months Homicidal Ideation: No Does patient have any lifetime risk of violence toward others beyond the six months prior to admission? : No Thoughts of Harm to Others: No Current Homicidal Intent: No Current Homicidal Plan: No Access to Homicidal Means: No Identified Victim: None Reported History of harm to others?: No Assessment of Violence: None Noted Violent Behavior Description: None Reported Does patient have access to weapons?: No Criminal Charges Pending?: No Does patient have a court date: No Is patient on probation?: No  Psychosis Hallucinations: Visual, Auditory (In the past, approximately 12 months ago) Delusions: None noted  Mental Status Report Appearance/Hygiene: In scrubs, In hospital gown, Unremarkable Eye Contact: Fair Motor Activity: Freedom of movement Speech: Logical/coherent, Soft Level of Consciousness: Alert Mood: Anxious, Sad, Pleasant Affect: Apathetic, Appropriate to circumstance Anxiety Level: Minimal Thought Processes: Coherent, Relevant Judgement: Impaired Orientation: Person, Place, Time, Situation, Appropriate for developmental age Obsessive Compulsive Thoughts/Behaviors: Minimal  Cognitive Functioning Concentration: Normal Memory: Remote Intact, Recent Intact IQ: Average Insight: Poor Impulse Control: Poor Appetite: Good Weight Loss: 0 Weight Gain: 0 Sleep: Decreased Total Hours of Sleep: 6 (6 to 8, usually 8 to 10 hours) Vegetative Symptoms: None  ADLScreening [facility] Assessment Services) Patient's cognitive ability adequate to safely complete daily  activities?: Yes Patient able to express need for assistance with ADLs?: Yes Independently performs ADLs?: Yes (appropriate for developmental age)  Prior Inpatient Therapy Prior Inpatient Therapy: Yes (15 inpatient stays) Prior Therapy Dates: 2015 Prior Therapy Facilty/Provider(s): [facility] and other states Reason for Treatment: "Manic attacks"  Prior Outpatient Therapy Prior Outpatient Therapy: Yes Prior Therapy Dates: Currently Prior Therapy Facilty/Provider(s): Dr. Omelia Blackwater ([facility]) Reason for Treatment: Bipolar Does patient have an ACCT team?: No Does patient have Intensive In-House Services?  : No Does patient have [facility] services? : No Does patient have P4CC services?: No  ADL Screening (condition at time of admission) Patient's cognitive ability adequate to safely complete daily activities?: Yes Is the patient deaf or have difficulty hearing?: No Does the patient have difficulty seeing, even when wearing glasses/contacts?: Yes (Wear glassess) Does the patient have difficulty concentrating, remembering, or making decisions?: No Patient able to express need for assistance with ADLs?: Yes Does the patient have difficulty dressing or bathing?: No Independently performs ADLs?: Yes (appropriate for developmental age) Does the patient have difficulty walking or climbing stairs?: No Weakness of Legs: None Weakness of Arms/Hands: None  Home Assistive Devices/Equipment Home Assistive Devices/Equipment: None  Therapy Consults (therapy consults require a physician order) PT Evaluation Needed: No OT Evalulation Needed: No SLP Evaluation Needed: No Abuse/Neglect Assessment (Assessment to be complete while patient is alone) Physical Abuse: Denies Verbal Abuse: Denies Sexual Abuse: Denies Exploitation of patient/patient's resources: Denies Self-Neglect: Denies Values / Beliefs Cultural Requests During Hospitalization: None Spiritual Requests During Hospitalization:  None Consults Spiritual Care Consult Needed: No Social  Work Consult Needed: No Merchant navy officer (For Healthcare) Does patient have an advance directive?: No    Additional Information 1:1 In Past 12 Months?: No CIRT Risk: No Elopement Risk: No Does patient have medical clearance?: Yes  Child/Adolescent Assessment Running Away Risk:  (Patient is an adult)  Disposition:  Disposition Initial Assessment Completed for this Encounter: Yes Disposition of Patient: Other dispositions (Psych MD to see) Other disposition(s): Other (Comment) (Psych MD to see)  On Site Evaluation by:   Reviewed with Physician:     Lilyan Gilford, MS, LCAS, LPC, NCC, CCSI 04/10/2015 5:56 PM

## 2015-04-10 NOTE — ED Notes (Addendum)
Pt present with manic behavior in that voices are telling her that will be a nuclear bomb coming tonight and so she went to the store and a cart full of groceries. Pt states she is getting prepared for the nuclear bomb. Pt denies voices telling her to harm herself or anyone else. Pt with hx of manic behavior. Husband states she has been escalating in behavior over the past few days. Pt calm and cooperative in triage. A/Ox3.  Husband states pt has history of eloping from the hospital.

## 2015-04-10 NOTE — ED Notes (Signed)
Patient presented with blank, fearful affect. Cooperative with assessment. Patient stated staff could speak with her husband. Patient stated she is taking her mediations regularly, but feels "they aren't working like they should."  Q 15 min checks in progress.

## 2015-04-10 NOTE — ED Notes (Signed)
VOL/Consult pending  

## 2015-04-10 NOTE — ED Notes (Signed)
Pt given a ginger ale and a warm blanket. Pt calm and cooperative at this time.

## 2015-04-11 ENCOUNTER — Inpatient Hospital Stay: Admission: EM | Admit: 2015-04-11 | Payer: Medicare Other | Admitting: Psychiatry

## 2015-04-11 DIAGNOSIS — F250 Schizoaffective disorder, bipolar type: Secondary | ICD-10-CM

## 2015-04-11 DIAGNOSIS — E119 Type 2 diabetes mellitus without complications: Secondary | ICD-10-CM

## 2015-04-11 NOTE — ED Notes (Signed)
Pt. Noted in room sleeping;. No complaints or concerns voiced. No distress or abnormal behavior noted. Will continue to monitor with security cameras. Q 15 minute rounds continue. 

## 2015-04-11 NOTE — Progress Notes (Signed)
Pt. is to be [facility] to [facility] by Dr. Dr. Lucianne MussKumar, [place], [place], [place] and [place]. Attending Physician will be Dr. Jennet MaduroPucilowska.  Pt. has been [facility] to room 307, [facility] [place] .  Intake Paper Work has been signed and placed on pt. chart. ER staff ( ER Sect.; Dr. Misty StanleyLisa, ER MD; Carollee MassedKaminski Patient's Nurse & Wilkie AyeKristy Patient Access) have been made aware of the admission.  04/11/2015 Cheryl FlashNicole Naveen Clardy, MS, NCC, LPCA Therapeutic Triage Specialist

## 2015-04-11 NOTE — ED Notes (Signed)
Patient watching television. Currently no gross psychosis exhibited. Maintained on 15 minute checks and observation by security camera for safety.

## 2015-04-11 NOTE — Consult Note (Signed)
[place] Psychiatry Consult   Reason for Consult:  Consult for this 59 year old woman with a history of recurrent psychotic episodes who voluntarily came to the hospital because of some acute psychotic behavior Referring Physician:  [place] Patient Identification: Rita Sullivan MRN:  [id number] Principal Diagnosis: Schizoaffective disorder [facility]) Diagnosis:   Patient Active Problem List   Diagnosis Date Noted  . Schizoaffective disorder ([place]) [F25.9] 04/11/2015  . Elevated cholesterol [E78.00] 04/11/2015    Total Time spent with patient: 1 hour  Subjective:   Rita Sullivan is a 59 y.o. female patient admitted with "the day before yesterday I heard a voice so I went to buy some beans".  HPI:  Information from the patient and the chart. Patient interviewed. Chart reviewed including the intake evaluation from yesterday evening. Patient is known to me from previous admissions and I also reviewed old notes in the chart as well as her lab studies. Patient tells me that the day before yesterday she began hearing a voice won't in my head" that was telling her that there was going to be a nuclear bomb go off. It directed her that she needed to go to [facility] and buy some cans of beans. The voices did not tell her to do anything else. At no time did she have any suicidal or homicidal thoughts. She did feel nervous of the time as would be expected if she thought there was going to be a nuclear war. When she got home apparently with over 100 cans of beans her husband had her come to the hospital. Last night it sounds like she was still talking about having some vague beliefs about the nuclear war but today she tells me that the voices are entirely gone. She tells me that she no longer believes this. She realizes that it was a delusional thought. She denies that she has had any new stress recently. Denies that she has been noncompliant with her medicine. Denies any substance abuse. I inquired whether  she might of hurt anything on the news that would've planted this fear in her mind and she could not remember anything. She states that her mood today is feeling fine. Absolutely denies any suicidal or homicidal ideation. Patient can remember all of her medicines correctly and even remembers that she is supposed to take the Geodon with food. She states that she has been compliant with her medicines. She says she has been sleeping fine her regular 8 hours a night.  Social history: Patient is married. She and her husband live together. Both of them retired. Sounds like they have a fairly quiet life at home most of the time. Social life fairly limited. Patient does not complain of any acute social problems.  Medical history: She has elevated cholesterol and has diabetes although apparently it was assumed to be diet controlled and she is not on any medicine for it. She denies having any other medical problems no history of stroke or myocardial infarction.  Substance abuse history: Patient says that she drinks alcohol only about one or 2 times a month and never abusively. Says that she has not had any in about 3 weeks. Denies use of any other drugs or abuse of use of medicines. Past Psychiatric History:  Patient has a history that has been diagnosed as bipolar disorder. I last saw her earlier this year when she did have a manic-like episode that led her to drive all the way to [place]. She stabilized very well when she got  back on her medicine. There is no known history of suicide attempts or violence.    Risk to Self: Suicidal Ideation: No Suicidal Intent: No Is patient at risk for suicide?: No Suicidal Plan?: No Access to Means: No What has been your use of drugs/alcohol within the last 12 months?: No abuse reported Other Self Harm Risks: None Reported Triggers for Past Attempts: None known Intentional Self Injurious Behavior: None Risk to Others: Homicidal Ideation: No Thoughts of Harm to Others:  No Current Homicidal Intent: No Current Homicidal Plan: No Access to Homicidal Means: No Identified Victim: None Reported History of harm to others?: No Assessment of Violence: None Noted Violent Behavior Description: None Reported Does patient have access to weapons?: No Criminal Charges Pending?: No Does patient have a court date: No Prior Inpatient Therapy: Prior Inpatient Therapy: Yes (15 inpatient stays) Prior Therapy Dates: 2015 Prior Therapy Facilty/Provider(s): [facility] and other states Reason for Treatment: "Manic attacks" Prior Outpatient Therapy: Prior Outpatient Therapy: Yes Prior Therapy Dates: Currently Prior Therapy Facilty/Provider(s): Dr. Rosine Door ([facility]) Reason for Treatment: Bipolar Does patient have an ACCT team?: No Does patient have Intensive In-House Services?  : No Does patient have [facility] services? : No Does patient have P4CC services?: No  Past Medical History:  Past Medical History  Diagnosis Date  . Bipolar disorder ([place])   . Hypercholesteremia   . Chronic kidney disease   . Diabetes mellitus without complication ([place])   . Manic behavior ([place])     Past Surgical History  Procedure Laterality Date  . Colonoscopy with propofol N/A 03/11/2015    Procedure: COLONOSCOPY WITH PROPOFOL;  Surgeon: Hulen Luster, MD;  Location: [facility] ENDOSCOPY;  Service: Gastroenterology;  Laterality: N/A;   Family History: No family history on file. Family Psychiatric  History:  Patient states that she has no family history of mental health problems or substance abuse problems in anyone that she is aware of Social History:  History  Alcohol Use No     History  Drug Use Not on file    Social History   Social History  . Marital Status: Married    Spouse Name: N/A  . Number of Children: N/A  . Years of Education: N/A   Social History Main Topics  . Smoking status: Never Smoker   . Smokeless tobacco: Never Used  . Alcohol Use: No  . Drug Use: None   . Sexual Activity: Not Asked   Other Topics Concern  . None   Social History Narrative   Additional Social History:    Pain Medications: See PTA Prescriptions: See PTA Over the Counter: See PTA History of alcohol / drug use?: No history of alcohol / drug abuse Longest period of sobriety (when/how long):  (No abused reported) Negative Consequences of Use:  (No abused reported) Withdrawal Symptoms:  (No abused reported)                     Allergies:   Allergies  Allergen Reactions  . Divalproex Sodium   . Penicillins     Labs:  Results for orders placed or performed during the hospital encounter of 04/10/15 (from the past 48 hour(s))  Comprehensive metabolic panel     Status: Abnormal   Collection Time: 04/10/15  1:36 PM  Result Value Ref Range   Sodium 136 135 - 145 mmol/L   Potassium 4.3 3.5 - 5.1 mmol/L   Chloride 108 101 - 111 mmol/L   CO2 19 (L)  22 - 32 mmol/L   Glucose, Bld 185 (H) 65 - 99 mg/dL   BUN 26 (H) 6 - 20 mg/dL   Creatinine, Ser 0.95 0.44 - 1.00 mg/dL   Calcium 10.1 8.9 - 10.3 mg/dL   Total Protein 7.9 6.5 - 8.1 g/dL   Albumin 4.8 3.5 - 5.0 g/dL   AST 29 15 - 41 U/L   ALT 28 14 - 54 U/L   Alkaline Phosphatase 143 (H) 38 - 126 U/L   Total Bilirubin 0.6 0.3 - 1.2 mg/dL   GFR calc non Af Amer >60 >60 mL/min   GFR calc Af Amer >60 >60 mL/min    Comment: (NOTE) The eGFR has been calculated using the CKD EPI equation. This calculation has not been validated in all clinical situations. eGFR's persistently <60 mL/min signify possible Chronic Kidney Disease.    Anion gap 9 5 - 15  CBC     Status: Abnormal   Collection Time: 04/10/15  1:36 PM  Result Value Ref Range   WBC 12.6 (H) 3.6 - 11.0 K/uL   RBC 4.83 3.80 - 5.20 MIL/uL   Hemoglobin 15.1 12.0 - 16.0 g/dL   HCT 44.3 35.0 - 47.0 %   MCV 91.8 80.0 - 100.0 fL   MCH 31.4 26.0 - 34.0 pg   MCHC 34.2 32.0 - 36.0 g/dL   RDW 12.6 11.5 - 14.5 %   Platelets 335 150 - 440 K/uL  Urine Drug  Screen, Qualitative ([facility] only)     Status: None   Collection Time: 04/10/15  1:36 PM  Result Value Ref Range   Tricyclic, Ur Screen NONE DETECTED NONE DETECTED   Amphetamines, Ur Screen NONE DETECTED NONE DETECTED   MDMA (Ecstasy)Ur Screen NONE DETECTED NONE DETECTED   Cocaine Metabolite,Ur [place] NONE DETECTED NONE DETECTED   Opiate, Ur Screen NONE DETECTED NONE DETECTED   Phencyclidine (PCP) Ur S NONE DETECTED NONE DETECTED   Cannabinoid 50 Ng, Ur [place] NONE DETECTED NONE DETECTED   Barbiturates, Ur Screen NONE DETECTED NONE DETECTED   Benzodiazepine, Ur Scrn NONE DETECTED NONE DETECTED   Methadone Scn, Ur NONE DETECTED NONE DETECTED    Comment: (NOTE) 435  Tricyclics, urine               Cutoff 1000 ng/mL 200  Amphetamines, urine             Cutoff 1000 ng/mL 300  MDMA (Ecstasy), urine           Cutoff 500 ng/mL 400  Cocaine Metabolite, urine       Cutoff 300 ng/mL 500  Opiate, urine                   Cutoff 300 ng/mL 600  Phencyclidine (PCP), urine      Cutoff 25 ng/mL 700  Cannabinoid, urine              Cutoff 50 ng/mL 800  Barbiturates, urine             Cutoff 200 ng/mL 900  Benzodiazepine, urine           Cutoff 200 ng/mL 1000 Methadone, urine                Cutoff 300 ng/mL 1100 1200 The urine drug screen provides only a preliminary, unconfirmed 1300 analytical test result and should not be used for non-medical 1400 purposes. Clinical consideration and professional judgment should 1500 be applied to any positive drug screen result due  to possible 1600 interfering substances. A more specific alternate chemical method 1700 must be used in order to obtain a confirmed analytical result.  1800 Gas chromato graphy / mass spectrometry (GC/MS) is the preferred 1900 confirmatory method.     No current facility-administered medications for this encounter.   Current Outpatient Prescriptions  Medication Sig Dispense Refill  . aspirin EC 81 MG tablet Take 81 mg by mouth daily.    .  calcium citrate-vitamin D (CITRACAL+D) 315-200 MG-UNIT tablet Take 1 tablet by mouth 2 (two) times daily.    . cholecalciferol (VITAMIN D) 400 UNITS TABS tablet Take 400 Units by mouth daily.    . vitamin C (ASCORBIC ACID) 500 MG tablet Take 500 mg by mouth daily.    . carbamazepine (TEGRETOL) 200 MG tablet Take 200 mg by mouth 2 (two) times daily.     . propranolol (INDERAL) 20 MG tablet Take 20 mg by mouth 2 (two) times daily.     . QUEtiapine (SEROQUEL) 300 MG tablet Take 300 mg by mouth at bedtime.    . rosuvastatin (CRESTOR) 40 MG tablet Take 40 mg by mouth daily.    . ziprasidone (GEODON) 60 MG capsule Take 60 mg by mouth 2 (two) times daily with a meal.     . zonisamide (ZONEGRAN) 100 MG capsule Take 200 mg by mouth 2 (two) times daily.       Musculoskeletal: Strength & Muscle Tone: within normal limits Gait & Station: normal Patient leans: N/A  Psychiatric Specialty Exam: Review of Systems  Constitutional: Negative.   HENT: Negative.   Eyes: Negative.   Respiratory: Negative.   Cardiovascular: Negative.   Gastrointestinal: Negative.   Musculoskeletal: Negative.   Skin: Negative.   Neurological: Negative.   Psychiatric/Behavioral: Negative for depression, suicidal ideas, hallucinations, memory loss and substance abuse. The patient is not nervous/anxious and does not have insomnia.     Blood pressure 121/56, pulse 88, temperature 97.4 F (36.3 C), temperature source Oral, resp. rate 18, height _0  (1.575 m), weight 81.647 kg (180 lb), SpO2 100 %.Body mass index is 32.91 kg/(m^2).  General Appearance: Disheveled  Eye Sport and exercise psychologist::  Fair  Speech:  Normal Rate  Volume:  Normal  Mood:  Euthymic  Affect:  Appropriate  Thought Process:  Logical  Orientation:  Full (Time, Place, and Person)  Thought Content:  Negative  Suicidal Thoughts:  No  Homicidal Thoughts:  No  Memory:  Immediate;   Good Recent;   Fair Remote;   Fair  Judgement:  Fair  Insight:  Fair  Psychomotor  Activity:  Normal  Concentration:  Fair  Recall:  [employer] of Knowledge:Fair  Language: Fair  Akathisia:  No  Handed:  Right  AIMS (if indicated):     Assets:  Communication Skills Desire for Improvement Financial Resources/Insurance Housing Intimacy Physical Health Resilience Social Support  ADL's:  Intact  Cognition: WNL  Sleep:      Treatment Plan Summary: Medication management and Plan This is a 59 year old woman who has a history of chronic mental health problems either bipolar disorder or schizoaffective disorder. It sounds like she had about a day of hearing voices but the worst behavior that she had was buying a large number of cans of beans at the grocery store. There is no report or evidence of any actual dangerous behavior or thinking during that time. On my evaluation today the patient convincingly appears free of psychotic symptoms. She describes her believes as being delusional and seems  very sincere in understanding that there is nothing that she needs to worry about. Patient is completely agreeable to staying on her medicine. Patient is not under involuntary commitment. As far as her schizoaffective disorder she appears to be re-stabilizing. She is on appropriate medicine. She does not meet commitment criteria. I advised the patient that I would recommend that we admitted her to the hospital even if only briefly just to make sure that this episode was not going to return. Patient was educated again about the dangers of bipolar disorder. Despite this the patient says that she would prefer to go home. She promises that she will continue on her medicines. She will follow-up with Dr. Rosine Door as usual. Patient was educated to be on the look out for disruptions of sleep, return of hallucinations, any kind of unusual or anxious thoughts that she is having. She is encouraged to share any of the symptoms immediately with her husband and consider returning to the hospital if they return.  She agrees to all of that. No change to medicine and she does not need any new prescription. Incidentally it is noted that her blood sugar was elevated at 185. This suggests that her diabetes is probably not well controlled by her current circumstance. She was educated about proper diet for controlling diabetes but also will be encouraged to follow-up with her primary doctor who is Dr. Ouida Sills to reassess this. Same with her cholesterol.Case discussed with emergency room doctor and psychiatry team. Patient will be released from the emergency room at the discretion of the emergency room doctor.  Disposition: No evidence of imminent risk to self or others at present.   Patient does not meet criteria for psychiatric inpatient admission. Supportive therapy provided about ongoing stressors. Discussed crisis plan, support from social network, calling 911, coming to the Emergency Department, and calling Suicide Hotline.  Bettyanne Dittman 04/11/2015 10:53 AM

## 2015-04-11 NOTE — ED Notes (Signed)
Patient met with Dr. Weber Cooks. Decision made to discharge patient home with follow up outpatient therapist.

## 2015-04-11 NOTE — ED Notes (Signed)
Patient discharged ambulatory to home, accompanied by husband. She denies SI or HI. Discharge instructions reviewed with patient, she verbalizes understanding. Patient received copy of discharge instructions and all personal belongings.

## 2015-04-11 NOTE — ED Notes (Signed)
Patient resting quietly in room. No noted distress or abnormal behaviors noted. Will continue 15 minute checks and observation by security camera for safety. 

## 2015-04-11 NOTE — ED Notes (Signed)

## 2015-04-11 NOTE — Discharge Instructions (Signed)
Bipolar Disorder °Bipolar disorder is a mental illness. The term bipolar disorder actually is used to describe a group of disorders that all share varying degrees of emotional highs and lows that can interfere with daily functioning, such as work, school, or relationships. Bipolar disorder also can lead to drug abuse, hospitalization, and suicide. °The emotional highs of bipolar disorder are periods of elation or irritability and high energy. These highs can range from a mild form (hypomania) to a severe form (mania). People experiencing episodes of hypomania may appear energetic, excitable, and highly productive. People experiencing mania may behave impulsively or erratically. They often make poor decisions. They may have difficulty sleeping. The most severe episodes of mania can involve having very distorted beliefs or perceptions about the world and seeing or hearing things that are not real (psychotic delusions and hallucinations).  °The emotional lows of bipolar disorder (depression) also can range from mild to severe. Severe episodes of bipolar depression can involve psychotic delusions and hallucinations. °Sometimes people with bipolar disorder experience a state of mixed mood. Symptoms of hypomania or mania and depression are both present during this mixed-mood episode. °SIGNS AND SYMPTOMS °There are signs and symptoms of the episodes of hypomania and mania as well as the episodes of depression. The signs and symptoms of hypomania and mania are similar but vary in severity. They include: °· Inflated self-esteem or feeling of increased self-confidence. °· Decreased need for sleep. °· Unusual talkativeness (rapid or pressured speech) or the feeling of a need to keep talking. °· Sensation of racing thoughts or constant talking, with quick shifts between topics that may or may not be related (flight of ideas). °· Decreased ability to focus or concentrate. °· Increased purposeful activity, such as work, studies,  or social activity, or nonproductive activity, such as pacing, squirming and fidgeting, or finger and toe tapping. °· Impulsive behavior and use of poor judgment, resulting in high-risk activities, such as having unprotected sex or spending excessive amounts of money. °Signs and symptoms of depression include the following:  °· Feelings of sadness, hopelessness, or helplessness. °· Frequent or uncontrollable episodes of crying. °· Lack of feeling anything or caring about anything. °· Difficulty sleeping or sleeping too much.  °· Inability to enjoy the things you used to enjoy.   °· Desire to be alone all the time.   °· Feelings of guilt or worthlessness.  °· Lack of energy or motivation.   °· Difficulty concentrating, remembering, or making decisions.  °· Change in appetite or weight beyond normal fluctuations. °· Thoughts of death or the desire to harm yourself. °DIAGNOSIS  °Bipolar disorder is diagnosed through an assessment by your caregiver. Your caregiver will ask questions about your emotional episodes. There are two main types of bipolar disorder. People with type I bipolar disorder have manic episodes with or without depressive episodes. People with type II bipolar disorder have hypomanic episodes and major depressive episodes, which are more serious than mild depression. The type of bipolar disorder you have can make an important difference in how your illness is monitored and treated. °Your caregiver may ask questions about your medical history and use of alcohol or drugs, including prescription medication. Certain medical conditions and substances also can cause emotional highs and lows that resemble bipolar disorder (secondary bipolar disorder).  °TREATMENT  °Bipolar disorder is a long-term illness. It is best controlled with continuous treatment rather than treatment only when symptoms occur. The following treatments can be prescribed for bipolar disorders: °· Medication--Medication can be prescribed by  a doctor that   is an expert in treating mental disorders (psychiatrists). Medications called mood stabilizers are usually prescribed to help control the illness. Other medications are sometimes added if symptoms of mania, depression, or psychotic delusions and hallucinations occur despite the use of a mood stabilizer. °· Talk therapy--Some forms of talk therapy are helpful in providing support, education, and guidance. °A combination of medication and talk therapy is best for managing the disorder over time. A procedure in which electricity is applied to your brain through your scalp (electroconvulsive therapy) is used in cases of severe mania when medication and talk therapy do not work or work too slowly. °  °This information is not intended to replace advice given to you by your health care provider. Make sure you discuss any questions you have with your health care provider. °  °Document Released: 08/17/2000 Document Revised: 06/01/2014 Document Reviewed: 06/06/2012 °Elsevier Interactive Patient Education ©2016 Elsevier Inc. ° °

## 2015-04-11 NOTE — Progress Notes (Signed)
Plans to admit pt per Dr Lucianne MussKumar.   04/11/2015 Cheryl FlashNicole Jonel Weldon, MS, NCC, LPCA Therapeutic Triage Specialist

## 2015-04-11 NOTE — ED Provider Notes (Addendum)
-----------------------------------------   7:36 AM on 04/11/2015 -----------------------------------------   BP 117/80 mmHg  Pulse 113  Temp(Src) 97.8 F (36.6 C) (Oral)  Resp 18  Ht 5\' 2"  (1.575 m)  Wt 180 lb (81.647 kg)  BMI 32.91 kg/m2  SpO2 100%  I have reviewed the patients recent care with the nursing staff.  The patient has had no acute events since last update.  The patient is calm and cooperative at this time.  Disposition is pending per Psychiatry/Behavioral Medicine team recommendations.    Darien Ramusavid W Edom Schmuhl, MD 04/11/15 [phone]  ----------------------------------------- 10:30 AM on 04/11/2015 -----------------------------------------  Peggye FormI've been advised that Dr. Lucianne MussKumar has arranged for admission for this patient to [facility].  Darien Ramusavid W Zyair Rhein, MD 04/11/15 1030  ----------------------------------------- 11:37 AM on 04/11/2015 -----------------------------------------  Danise EdgeLaura Nehme has been seen by Dr. Toni Amendlapacs. The patient is declining admission to the hospital. She is voluntary. Dr. Toni Amendlapacs agrees with this discharge. [facility] Dr[facility] Omelia [place]He[place].  Darien Ramusavid W Nasri Boakye, MD 04/11/15 [phone]

## 2015-04-11 NOTE — ED Notes (Signed)
Patient waiting for family to arrive. Family has patient clothing.

## 2015-05-02 ENCOUNTER — Encounter: Payer: Medicare Other | Attending: Internal Medicine | Admitting: *Deleted

## 2015-05-02 ENCOUNTER — Encounter: Payer: Self-pay | Admitting: *Deleted

## 2015-05-02 VITALS — BP 104/64 | Wt 185.2 lb

## 2015-05-02 DIAGNOSIS — E119 Type 2 diabetes mellitus without complications: Secondary | ICD-10-CM | POA: Diagnosis present

## 2015-05-02 NOTE — Progress Notes (Signed)
Diabetes Self-Management Education  Visit Type: First/Initial  Appt. Start Time: 1325 Appt. End Time: 1430  05/02/2015  Ms. Latressa Harries, identified by name and date of birth, is a 59 y.o. female with a diagnosis of Diabetes: Type 2.   ASSESSMENT  Blood pressure 104/64, weight 185 lb 3.2 oz (84.006 kg). Body mass index is 33.86 kg/(m^2).      Diabetes Self-Management Education - 05/02/15 1447    Visit Information   Visit Type First/Initial   Initial Visit   Diabetes Type Type 2   Are you currently following a meal plan? No   Are you taking your medications as prescribed? Yes   Date Diagnosed 16 years   Health Coping   How would you rate your overall health? Good   Psychosocial Assessment   Patient Belief/Attitude about Diabetes Other (comment)  "I don't feel too bad about it"   Self-care barriers None   Self-management support Doctor's office   Patient Concerns Problem Solving   Special Needs None   Preferred Learning Style Visual;Hands on   Learning Readiness Contemplating   How often do you need to have someone help you when you read instructions, pamphlets, or other written materials from your doctor or pharmacy? 1 - Never   What is the last grade level you completed in school? BS   Complications   Last HgB A1C per patient/outside source 7.8 %  04/15/15   How often do you check your blood sugar? 0 times/day (not testing)  Pt has a meter but is not checking her blood sugars. Last time in October.    Have you had a dilated eye exam in the past 12 months? Yes   Have you had a dental exam in the past 12 months? Yes   Are you checking your feet? Yes   How many days per week are you checking your feet? 1   Dietary Intake   Breakfast egg and cheese omelet; butter biscuit; chicken stew or leftovers from supper   Lunch sandwich and fruit   Dinner spaghetti; meat and potatoes   Snack (evening) fruit and cheese   Beverage(s) fruit juice, regular soda   Exercise   Exercise  Type Light (walking / raking leaves)   How many days per week to you exercise? 6   How many minutes per day do you exercise? 30   Total minutes per week of exercise 180   Patient Education   Previous Diabetes Education No   Disease state  Definition of diabetes, type 1 and 2, and the diagnosis of diabetes   Nutrition management  Role of diet in the treatment of diabetes and the relationship between the three main macronutrients and blood glucose level   Physical activity and exercise  Role of exercise on diabetes management, blood pressure control and cardiac health.   Monitoring Purpose and frequency of SMBG.;Identified appropriate SMBG and/or A1C goals.   Chronic complications Relationship between chronic complications and blood glucose control   Psychosocial adjustment Identified and addressed patients feelings and concerns about diabetes   Individualized Goals (developed by patient)   Nutrition Follow meal plan discussed   Reducing Risk Examine blood glucose patterns - check fasting blood sugars after eating fruit at night Prevent diabetes complications   Outcomes   Expected Outcomes Other (comment)  Pt responded "maybe" when asked if she was ready to make lifestyle changes.      Individualized Plan for Diabetes Self-Management Training:   Learning Objective:  Patient will have  a greater understanding of diabetes self-management. Patient education plan is to attend individual and/or group sessions per assessed needs and concerns.   Plan:   Patient Instructions  Check blood sugars 2 x day before breakfast and 2 hrs after supper - 3 to 4 x week Exercise: Continue walking for 30 minutes  6 days a week Eat 3 meals day,  1-2  snacks a day Space meals 4-6 hours apart Avoid sugar sweetened drinks (soda, juices) Don't skip meals Avoid fruit at bedtime Bring blood sugar records to the next class   Expected Outcomes:  Other (comment) (Pt responded "maybe" when asked if she was ready  to make lifestyle changes.)  Education material provided:  General Meal Planning Guidelines Simple Meal Plan  If problems or questions, patient to contact team via:  Sharion SettlerSheila Lopez Dentinger, RN, CCM, CDE [phone]  Future DSME appointment:  Monday May 13, 2015 for Class 1

## 2015-05-02 NOTE — Patient Instructions (Addendum)
Check blood sugars 2 x day before breakfast and 2 hrs after supper - 3 to 4 x week Exercise: Continue walking for 30 minutes  6 days a week Eat 3 meals day,  1-2  snacks a day Space meals 4-6 hours apart Avoid sugar sweetened drinks (soda, juices) Don't skip meals Avoid fruit at bedtime Bring blood sugar records to the next class

## 2015-05-13 ENCOUNTER — Encounter: Payer: Medicare Other | Admitting: Dietician

## 2015-05-13 VITALS — Ht 62.0 in | Wt 182.9 lb

## 2015-05-13 DIAGNOSIS — E119 Type 2 diabetes mellitus without complications: Secondary | ICD-10-CM | POA: Diagnosis not present

## 2015-05-13 NOTE — Progress Notes (Signed)

## 2015-05-21 ENCOUNTER — Encounter: Payer: Medicare Other | Admitting: Dietician

## 2015-05-21 ENCOUNTER — Encounter: Payer: Self-pay | Admitting: Dietician

## 2015-05-21 VITALS — Wt 183.5 lb

## 2015-05-21 DIAGNOSIS — E119 Type 2 diabetes mellitus without complications: Secondary | ICD-10-CM | POA: Diagnosis not present

## 2015-05-21 NOTE — Progress Notes (Signed)
Appt. Start Time: 1730 Appt. End Time: 2030  FBG's 200's-300's and 2 hr pp BG's 300's-400's ==pt needs med for diabetes -faxed MD BG's   Class 2 Diabetes Overview - define DM; state own type of DM; identify functions of pancreas and insulin; define insulin deficiency vs insulin resistance  Psychosocial - identify DM as a source of stress; state the effects of stress on BG control; verbalize appropriate stress management techniques; identify personal stress issues   Nutritional Management - describe effects of food on blood glucose; identify sources of carbohydrate, protein and fat; verbalize the importance of balance meals in controlling blood glucose; identify meals as well balanced or not; estimate servings of carbohydrate from menus; use food labels to identify servings size, content of carbohydrate, fiber, protein, fat, saturated fat and sodium; recognize food sources of fat, saturated fat, trans fat, sodium and verbalize goals for intake; describe healthful appropriate food choices when dining out   Exercise - describe the effects of exercise on blood glucose and importance of regular exercise in controlling diabetes; state a plan for personal exercise; verbalize contraindications for exercise  Medications - state name, dose, timing of currently prescribed medications; describe types of medications available for diabetes  Self-Monitoring - state importance of HBGM and demo procedure accurately; use HBGM results to effectively manage diabetes; identify importance of regular HbA1C testing and goals for results  Acute Complications/Sick Day Guidelines - recognize hyperglycemia and hypoglycemia with causes and effects; identify blood glucose results as high, low or in control; list steps in treating and preventing high and low blood glucose; state appropriate measure to manage blood glucose when ill (need for meds, HBGM plan, when to call physician, need for fluids)  Chronic Complications/Foot,  Skin, Eye Dental Care - identify possible long-term complications of diabetes (retinopathy, neuropathy, nephropathy, cardiovascular disease, infections); explain steps in prevention and treatment of chronic complications; state importance of daily self-foot exams; describe how to examine feet and what to look for; explain appropriate eye and dental care  Lifestyle Changes/Goals & Health/Community Resources - state benefits of making appropriate lifestyle changes; identify habits that need to change (meals, tobacco, alcohol); identify strategies to reduce risk factors for personal health; set goals for proper diabetes care; state need for and frequency of healthcare follow-up; describe appropriate community resources for good health (ADA, web sites, apps)   Pregnancy/Sexual Health - define gestational diabetes; state importance of good blood glucose control and birth control prior to pregnancy; state importance of good blood glucose control in preventing sexual problems (impotence, vaginal dryness, infections, loss of desire); state relationship of blood glucose control and pregnancy outcome; describe risk of maternal and fetal complications    Teaching Materials Used: Class 2 Slide Packet A1C Pamphlet Foot Care Literature Menu Ideas Goals for Class 2

## 2015-05-28 ENCOUNTER — Encounter: Payer: Self-pay | Admitting: Dietician

## 2015-05-28 ENCOUNTER — Encounter: Payer: Medicare Other | Attending: Internal Medicine | Admitting: Dietician

## 2015-05-28 VITALS — BP 106/60 | Ht 62.0 in | Wt 184.5 lb

## 2015-05-28 DIAGNOSIS — E119 Type 2 diabetes mellitus without complications: Secondary | ICD-10-CM

## 2015-05-28 NOTE — Progress Notes (Signed)

## 2015-06-06 ENCOUNTER — Encounter: Payer: Self-pay | Admitting: *Deleted

## 2016-01-07 ENCOUNTER — Encounter: Payer: Self-pay | Admitting: *Deleted

## 2016-05-14 ENCOUNTER — Emergency Department
Admission: EM | Admit: 2016-05-14 | Discharge: 2016-05-14 | Disposition: A | Discharge status: Home / Self Care | Payer: Medicare Other | Attending: Emergency Medicine | Admitting: Emergency Medicine

## 2016-05-14 ENCOUNTER — Encounter: Payer: Self-pay | Admitting: Emergency Medicine

## 2016-05-14 DIAGNOSIS — E1165 Type 2 diabetes mellitus with hyperglycemia: Secondary | ICD-10-CM | POA: Diagnosis not present

## 2016-05-14 DIAGNOSIS — N189 Chronic kidney disease, unspecified: Secondary | ICD-10-CM | POA: Insufficient documentation

## 2016-05-14 DIAGNOSIS — Z79899 Other long term (current) drug therapy: Secondary | ICD-10-CM | POA: Diagnosis not present

## 2016-05-14 DIAGNOSIS — F10129 Alcohol abuse with intoxication, unspecified: Secondary | ICD-10-CM | POA: Diagnosis not present

## 2016-05-14 DIAGNOSIS — R739 Hyperglycemia, unspecified: Secondary | ICD-10-CM

## 2016-05-14 DIAGNOSIS — F10920 Alcohol use, unspecified with intoxication, uncomplicated: Secondary | ICD-10-CM

## 2016-05-14 DIAGNOSIS — E1122 Type 2 diabetes mellitus with diabetic chronic kidney disease: Secondary | ICD-10-CM | POA: Diagnosis not present

## 2016-05-14 LAB — CBC
HCT: 42.4 % (ref 35.0–47.0)
Hemoglobin: 14.6 g/dL (ref 12.0–16.0)
MCH: 31.9 pg (ref 26.0–34.0)
MCHC: 34.3 g/dL (ref 32.0–36.0)
MCV: 92.8 fL (ref 80.0–100.0)
PLATELETS: 282 10*3/uL (ref 150–440)
RBC: 4.58 MIL/uL (ref 3.80–5.20)
RDW: 11.8 % (ref 11.5–14.5)
WBC: 8 10*3/uL (ref 3.6–11.0)

## 2016-05-14 LAB — BASIC METABOLIC PANEL
ANION GAP: 13 (ref 5–15)
BUN: 11 mg/dL (ref 6–20)
CO2: 20 mmol/L — ABNORMAL LOW (ref 22–32)
CREATININE: 1.29 mg/dL — AB (ref 0.44–1.00)
Calcium: 9.6 mg/dL (ref 8.9–10.3)
Chloride: 100 mmol/L — ABNORMAL LOW (ref 101–111)
GFR, EST AFRICAN AMERICAN: 52 mL/min — AB (ref >60)
GFR, EST NON AFRICAN AMERICAN: 45 mL/min — AB (ref >60)
Glucose, Bld: 466 mg/dL — ABNORMAL HIGH (ref 65–99)
Potassium: 4.4 mmol/L (ref 3.5–5.1)
SODIUM: 133 mmol/L — AB (ref 135–145)

## 2016-05-14 LAB — COMPREHENSIVE METABOLIC PANEL
ALK PHOS: 144 U/L — AB (ref 38–126)
ALT: 37 U/L (ref 14–54)
ANION GAP: 13 (ref 5–15)
AST: 43 U/L — ABNORMAL HIGH (ref 15–41)
Albumin: 4.4 g/dL (ref 3.5–5.0)
BUN: 11 mg/dL (ref 6–20)
CALCIUM: 9.7 mg/dL (ref 8.9–10.3)
CHLORIDE: 100 mmol/L — AB (ref 101–111)
CO2: 19 mmol/L — ABNORMAL LOW (ref 22–32)
CREATININE: 1.33 mg/dL — AB (ref 0.44–1.00)
GFR, EST AFRICAN AMERICAN: 50 mL/min — AB (ref >60)
GFR, EST NON AFRICAN AMERICAN: 43 mL/min — AB (ref >60)
Glucose, Bld: 467 mg/dL — ABNORMAL HIGH (ref 65–99)
Potassium: 4.4 mmol/L (ref 3.5–5.1)
Sodium: 132 mmol/L — ABNORMAL LOW (ref 135–145)
Total Bilirubin: 0.3 mg/dL (ref 0.3–1.2)
Total Protein: 7.3 g/dL (ref 6.5–8.1)

## 2016-05-14 LAB — URINALYSIS, COMPLETE (UACMP) WITH MICROSCOPIC
Bacteria, UA: NONE SEEN
Bilirubin Urine: NEGATIVE
HGB URINE DIPSTICK: NEGATIVE
Ketones, ur: 5 mg/dL — AB
NITRITE: NEGATIVE
PROTEIN: NEGATIVE mg/dL
RBC / HPF: NONE SEEN RBC/hpf (ref 0–5)
Specific Gravity, Urine: 1.003 — ABNORMAL LOW (ref 1.005–1.030)
pH: 6 (ref 5.0–8.0)

## 2016-05-14 LAB — GLUCOSE, CAPILLARY
GLUCOSE-CAPILLARY: 438 mg/dL — AB (ref 65–99)
GLUCOSE-CAPILLARY: 406 mg/dL — AB (ref 65–99)

## 2016-05-14 LAB — ETHANOL: ALCOHOL ETHYL (B): 134 mg/dL — AB (ref <5)

## 2016-05-14 MED ORDER — METFORMIN HCL 500 MG PO TABS: 500.0000 mg | ORAL_TABLET | Freq: Two times a day (BID) | ORAL | 11 refills | Status: DC

## 2016-05-14 MED ORDER — METFORMIN HCL 500 MG PO TABS
500.0000 mg | ORAL_TABLET | Freq: Once | ORAL | Status: AC
  Administered 2016-05-14: 500 mg via ORAL
  Filled 2016-05-14 (×2): qty 1

## 2016-05-14 MED ORDER — SODIUM CHLORIDE 0.9 % IV BOLUS (SEPSIS)
1000.0000 mL | Freq: Once | INTRAVENOUS | Status: AC
  Administered 2016-05-14: 1000 mL via INTRAVENOUS

## 2016-05-14 MED ORDER — INSULIN ASPART 100 UNIT/ML ~~LOC~~ SOLN
5.0000 [IU] | Freq: Once | SUBCUTANEOUS | Status: AC
Start: 2016-05-14 — End: 2016-05-14
  Administered 2016-05-14: 5 [IU] via SUBCUTANEOUS

## 2016-05-14 MED ORDER — INSULIN ASPART 100 UNIT/ML ~~LOC~~ SOLN
SUBCUTANEOUS | Status: AC
  Administered 2016-05-14: 5 [IU] via SUBCUTANEOUS
  Filled 2016-05-14: qty 5

## 2016-05-14 NOTE — ED Notes (Signed)
This RN discussed symptoms with Dr. Roxan Hockeyobinson, per Dr. Roxan Hockeyobinson do not call a code stroke.

## 2016-05-14 NOTE — ED Triage Notes (Signed)
Pt presents to ED with c/o slurred speech that started at approx 1600. Pt states she had high blood sugar so she drank 3 mixed drinks of tequila and Dr. Reino KentPepper because she thought that would lower her blood sugar. Facial symmetry intact, and grip strengths equal bilaterally. Pt states the dizziness started after she drank the 3 mixed drinks. Pt is alert and oriented, answering questions appropriately, facial symmetry intact, grip strengths equal bilaterally. Pt is noted to be chewing a piece of gum on her arrival.

## 2016-05-14 NOTE — ED Provider Notes (Signed)
[facility] Emergency Department Provider Note        Time seen: ----------------------------------------- 8:04 PM on 05/14/2016 -----------------------------------------    I have reviewed the triage vital signs and the nursing notes.   HISTORY  Chief Complaint Hyperglycemia and Aphasia    HPI Rita Sullivan is a 60 y.o. female who presents to the ER for high blood sugar. Patient states her blood sugar was elevated so she drank 3 mixed drinks of tequila and Dr. Reino KentPepper because she thought this would lower her blood sugar. She presents intoxicated with slurred speech and requesting medication to lower her blood sugar. She denies fevers, chills or other complaints.   Past Medical History:  Diagnosis Date  . Bipolar disorder (HCC)   . Chronic kidney disease   . Diabetes mellitus without complication (HCC)   . Hypercholesteremia   . Manic behavior (HCC)     Patient Active Problem List   Diagnosis Date Noted  . Schizoaffective disorder (HCC) 04/11/2015  . Elevated cholesterol 04/11/2015  . Diabetes mellitus type 2, diet-controlled (HCC) 04/11/2015    Past Surgical History:  Procedure Laterality Date  . COLONOSCOPY WITH PROPOFOL N/A 03/11/2015   Procedure: COLONOSCOPY WITH PROPOFOL;  Surgeon: Wallace CullensPaul Y Oh, MD;  [facility];  Service: Gastroenterology;  Laterality: N/A;    Allergies Divalproex sodium and Penicillins  Social History Social History  Substance Use Topics  . Smoking status: Never Smoker  . Smokeless tobacco: Never Used  . Alcohol use 1.8 oz/week    3 Glasses of wine per week     Comment: Occ    Review of Systems Constitutional: Negative for fever. Cardiovascular: Negative for chest pain. Respiratory: Negative for shortness of breath. Gastrointestinal: Negative for abdominal pain, vomiting and diarrhea. Genitourinary: Negative for dysuria. Musculoskeletal: Negative for back pain. Skin: Negative for  rash. Neurological: Negative for headaches, focal weakness or numbness.  10-point ROS otherwise negative.  ____________________________________________   PHYSICAL EXAM:  VITAL SIGNS: ED Triage Vitals [05/14/16 1816]  Enc Vitals Group     BP 103/62     Pulse Rate 98     Resp 18     Temp 97.6 F (36.4 C)     Temp Source Oral     SpO2 95 %     Weight 180 lb (81.6 kg)     Height 5\' 2"  (1.575 m)     Head Circumference      Peak Flow      Pain Score      Pain Loc      Pain Edu?      Excl. in GC?     Constitutional: Alert and oriented. Well appearing and in no distress. Eyes: Conjunctivae are normal. PERRL. Normal extraocular movements. ENT   Head: Normocephalic and atraumatic.   Nose: No congestion/rhinnorhea.   Mouth/Throat: Mucous membranes are moist.   Neck: No stridor. Cardiovascular: Normal rate, regular rhythm. No murmurs, rubs, or gallops. Respiratory: Normal respiratory effort without tachypnea nor retractions. Breath sounds are clear and equal bilaterally. No wheezes/rales/rhonchi. Gastrointestinal: Soft and nontender. Normal bowel sounds Musculoskeletal: Nontender with normal range of motion in all extremities. No lower extremity tenderness nor edema. Neurologic:  Normal speech and language. No gross focal neurologic deficits are appreciated.  Skin:  Skin is warm, dry and intact. No rash noted. Psychiatric: Mood and affect are normal. Speech and behavior are normal.  ____________________________________________  ED COURSE:  Pertinent labs & imaging results that were available during my care of  the patient were reviewed by me and considered in my medical decision making (see chart for details). Clinical Course   Patient is no distress, will give IV fluids, assess with labs and reevaluate.  Procedures ____________________________________________   LABS (pertinent positives/negatives)  Labs Reviewed  GLUCOSE, CAPILLARY - Abnormal; Notable for the  following:       Result Value   Glucose-Capillary 438 (*)    All other components within normal limits  BASIC METABOLIC PANEL - Abnormal; Notable for the following:    Sodium 133 (*)    Chloride 100 (*)    CO2 20 (*)    Glucose, Bld 466 (*)    Creatinine, Ser 1.29 (*)    GFR calc non Af Amer 45 (*)    GFR calc Af Amer 52 (*)    All other components within normal limits  URINALYSIS, COMPLETE (UACMP) WITH MICROSCOPIC - Abnormal; Notable for the following:    Color, Urine COLORLESS (*)    APPearance CLEAR (*)    Specific Gravity, Urine 1.003 (*)    Glucose, UA >=500 (*)    Ketones, ur 5 (*)    Leukocytes, UA TRACE (*)    Squamous Epithelial / LPF 0-5 (*)    All other components within normal limits  COMPREHENSIVE METABOLIC PANEL - Abnormal; Notable for the following:    Sodium 132 (*)    Chloride 100 (*)    CO2 19 (*)    Glucose, Bld 467 (*)    Creatinine, Ser 1.33 (*)    AST 43 (*)    Alkaline Phosphatase 144 (*)    GFR calc non Af Amer 43 (*)    GFR calc Af Amer 50 (*)    All other components within normal limits  ETHANOL - Abnormal; Notable for the following:    Alcohol, Ethyl (B) 134 (*)    All other components within normal limits  GLUCOSE, CAPILLARY - Abnormal; Notable for the following:    Glucose-Capillary 406 (*)    All other components within normal limits  CBC  CBG MONITORING, ED  CBG MONITORING, ED  ____________________________________________  FINAL ASSESSMENT AND PLAN  Hyperglycemia, alcohol intoxication  Plan: Patient with labs as dictated above. Patient presents to the ER after alcohol and sugary drink intake and found to be intoxicated with hyperglycemia. I will restart metformin, she has received IV fluids and insulin. She is stable for outpatient follow-up.   Emily FilbertWilliams, Jonathan E, MD   Note: This dictation was prepared with Dragon dictation. Any transcriptional errors that result from this process are unintentional    Emily FilbertJonathan E Williams,  MD 05/14/16 2105

## 2016-05-14 NOTE — ED Notes (Signed)
CBC collected, R AC, sent to lab. 

## 2016-07-29 ENCOUNTER — Emergency Department
Admission: EM | Admit: 2016-07-29 | Discharge: 2016-07-30 | Disposition: A | Discharge status: Other Acute Inpatient Hospital | Payer: Medicare HMO | Attending: Emergency Medicine | Admitting: Emergency Medicine

## 2016-07-29 ENCOUNTER — Encounter: Payer: Self-pay | Admitting: *Deleted

## 2016-07-29 DIAGNOSIS — Z7982 Long term (current) use of aspirin: Secondary | ICD-10-CM | POA: Diagnosis not present

## 2016-07-29 DIAGNOSIS — Z79899 Other long term (current) drug therapy: Secondary | ICD-10-CM | POA: Diagnosis not present

## 2016-07-29 DIAGNOSIS — E1122 Type 2 diabetes mellitus with diabetic chronic kidney disease: Secondary | ICD-10-CM | POA: Diagnosis not present

## 2016-07-29 DIAGNOSIS — E119 Type 2 diabetes mellitus without complications: Secondary | ICD-10-CM

## 2016-07-29 DIAGNOSIS — F250 Schizoaffective disorder, bipolar type: Secondary | ICD-10-CM | POA: Diagnosis not present

## 2016-07-29 DIAGNOSIS — N189 Chronic kidney disease, unspecified: Secondary | ICD-10-CM | POA: Diagnosis not present

## 2016-07-29 DIAGNOSIS — Z7984 Long term (current) use of oral hypoglycemic drugs: Secondary | ICD-10-CM | POA: Diagnosis not present

## 2016-07-29 DIAGNOSIS — F309 Manic episode, unspecified: Secondary | ICD-10-CM | POA: Diagnosis present

## 2016-07-29 LAB — COMPREHENSIVE METABOLIC PANEL
ALK PHOS: 107 U/L (ref 38–126)
ALT: 27 U/L (ref 14–54)
AST: 26 U/L (ref 15–41)
Albumin: 4.2 g/dL (ref 3.5–5.0)
Anion gap: 9 (ref 5–15)
BILIRUBIN TOTAL: 0.4 mg/dL (ref 0.3–1.2)
BUN: 15 mg/dL (ref 6–20)
CALCIUM: 9.4 mg/dL (ref 8.9–10.3)
CHLORIDE: 111 mmol/L (ref 101–111)
CO2: 20 mmol/L — ABNORMAL LOW (ref 22–32)
CREATININE: 0.63 mg/dL (ref 0.44–1.00)
Glucose, Bld: 328 mg/dL — ABNORMAL HIGH (ref 65–99)
Potassium: 3.5 mmol/L (ref 3.5–5.1)
Sodium: 140 mmol/L (ref 135–145)
Total Protein: 7.1 g/dL (ref 6.5–8.1)

## 2016-07-29 LAB — CBC
HCT: 38.9 % (ref 35.0–47.0)
Hemoglobin: 13.2 g/dL (ref 12.0–16.0)
MCH: 31.5 pg (ref 26.0–34.0)
MCHC: 33.8 g/dL (ref 32.0–36.0)
MCV: 93.3 fL (ref 80.0–100.0)
PLATELETS: 315 10*3/uL (ref 150–440)
RBC: 4.18 MIL/uL (ref 3.80–5.20)
RDW: 13.3 % (ref 11.5–14.5)
WBC: 11.3 10*3/uL — ABNORMAL HIGH (ref 3.6–11.0)

## 2016-07-29 LAB — ACETAMINOPHEN LEVEL: Acetaminophen (Tylenol), Serum: <10 ug/mL — ABNORMAL LOW (ref 10–30)

## 2016-07-29 LAB — ETHANOL

## 2016-07-29 LAB — SALICYLATE LEVEL

## 2016-07-29 LAB — GLUCOSE, CAPILLARY: Glucose-Capillary: 331 mg/dL — ABNORMAL HIGH (ref 65–99)

## 2016-07-29 MED ORDER — ROSUVASTATIN CALCIUM 20 MG PO TABS
40.0000 mg | ORAL_TABLET | Freq: Every day | ORAL | Status: DC
  Administered 2016-07-30: 40 mg via ORAL
  Filled 2016-07-29: qty 2

## 2016-07-29 MED ORDER — QUETIAPINE FUMARATE 300 MG PO TABS
300.0000 mg | ORAL_TABLET | Freq: Every day | ORAL | Status: DC
  Administered 2016-07-29: 300 mg via ORAL
  Filled 2016-07-29: qty 1

## 2016-07-29 MED ORDER — CARBAMAZEPINE 200 MG PO TABS
200.0000 mg | ORAL_TABLET | Freq: Two times a day (BID) | ORAL | Status: DC
  Administered 2016-07-29 – 2016-07-30 (×2): 200 mg via ORAL
  Filled 2016-07-29 (×2): qty 1

## 2016-07-29 MED ORDER — LORAZEPAM 1 MG PO TABS
1.0000 mg | ORAL_TABLET | Freq: Once | ORAL | Status: AC
  Administered 2016-07-29: 1 mg via ORAL
  Filled 2016-07-29: qty 1

## 2016-07-29 MED ORDER — CALCIUM CARBONATE-VITAMIN D 500-200 MG-UNIT PO TABS
1.0000 | ORAL_TABLET | Freq: Two times a day (BID) | ORAL | Status: DC
  Administered 2016-07-30: 10:00:00 1 via ORAL
  Filled 2016-07-29 (×3): qty 1

## 2016-07-29 MED ORDER — INSULIN ASPART 100 UNIT/ML ~~LOC~~ SOLN
0.0000 [IU] | Freq: Three times a day (TID) | SUBCUTANEOUS | Status: DC
  Administered 2016-07-30 (×2): 3 [IU] via SUBCUTANEOUS
  Filled 2016-07-29 (×2): qty 3

## 2016-07-29 MED ORDER — ZONISAMIDE 100 MG PO CAPS
100.0000 mg | ORAL_CAPSULE | Freq: Two times a day (BID) | ORAL | Status: DC
  Administered 2016-07-30: 100 mg via ORAL
  Filled 2016-07-29 (×3): qty 1

## 2016-07-29 MED ORDER — ASPIRIN EC 81 MG PO TBEC
81.0000 mg | DELAYED_RELEASE_TABLET | Freq: Every day | ORAL | Status: DC
  Administered 2016-07-30: 81 mg via ORAL
  Filled 2016-07-29: qty 1

## 2016-07-29 MED ORDER — METFORMIN HCL 500 MG PO TABS
500.0000 mg | ORAL_TABLET | Freq: Two times a day (BID) | ORAL | Status: DC
  Administered 2016-07-30: 500 mg via ORAL
  Filled 2016-07-29: qty 1

## 2016-07-29 MED ORDER — ZIPRASIDONE HCL 20 MG PO CAPS
20.0000 mg | ORAL_CAPSULE | Freq: Three times a day (TID) | ORAL | Status: DC
  Administered 2016-07-29 – 2016-07-30 (×2): 20 mg via ORAL
  Filled 2016-07-29 (×3): qty 1

## 2016-07-29 MED ORDER — FOLIC ACID 400 MCG PO TABS: 400.0000 ug | ORAL_TABLET | Freq: Every day | ORAL | Status: DC

## 2016-07-29 MED ORDER — VITAMIN C 500 MG PO TABS
500.0000 mg | ORAL_TABLET | Freq: Every day | ORAL | Status: DC
  Administered 2016-07-30: 500 mg via ORAL
  Filled 2016-07-29: qty 1

## 2016-07-29 MED ORDER — CHOLECALCIFEROL 10 MCG (400 UNIT) PO TABS
400.0000 [IU] | ORAL_TABLET | Freq: Every day | ORAL | Status: DC
  Administered 2016-07-30: 400 [IU] via ORAL
  Filled 2016-07-29: qty 1

## 2016-07-29 MED ORDER — CALCIUM CITRATE-VITAMIN D 315-200 MG-UNIT PO TABS: 1.0000 | ORAL_TABLET | Freq: Two times a day (BID) | ORAL | Status: DC

## 2016-07-29 MED ORDER — METFORMIN HCL 500 MG PO TABS
500.0000 mg | ORAL_TABLET | Freq: Once | ORAL | Status: AC
  Administered 2016-07-29: 500 mg via ORAL
  Filled 2016-07-29: qty 1

## 2016-07-29 MED ORDER — TRAZODONE HCL 100 MG PO TABS
100.0000 mg | ORAL_TABLET | Freq: Every day | ORAL | Status: DC
  Administered 2016-07-29: 100 mg via ORAL
  Filled 2016-07-29: qty 1

## 2016-07-29 MED ORDER — FOLIC ACID 1 MG PO TABS
0.5000 mg | ORAL_TABLET | Freq: Every day | ORAL | Status: DC
  Administered 2016-07-30: 0.5 mg via ORAL
  Filled 2016-07-29: qty 1

## 2016-07-29 MED ORDER — PROPRANOLOL HCL 10 MG PO TABS
20.0000 mg | ORAL_TABLET | Freq: Two times a day (BID) | ORAL | Status: DC
  Administered 2016-07-30: 20 mg via ORAL
  Filled 2016-07-29 (×3): qty 2

## 2016-07-29 NOTE — ED Notes (Signed)
Pt given caffeine free ginger ale, per request.

## 2016-07-29 NOTE — ED Notes (Signed)
Pt states she did NOT take any of her medication today but took it yesterday.

## 2016-07-29 NOTE — ED Provider Notes (Signed)
[facility] Emergency Department Provider Note  Time seen: 7:33 PM  I have reviewed the triage vital signs and the nursing notes.   HISTORY  Chief Complaint Manic Behavior    HPI Rita Sullivan is a 61 y.o. female with a past medical history bipolar disorder, CK D, diabetes, hyperlipidemia, who presents to the emergency department with concerns of being very manic. According to the patient's husband who dropped her off patient has not been sleeping, last time this happened the patient was found far away from her home. Patient denies any SI or HI. She does admit she has not been able to sleep for many days but cannot tell me exactly how long. Denies any substance use. Patient is very anxious appearing, jittery, for instance when I asked if she given a urine sample she jumped out of bed and went to the toilet before she could even answer my question.  Past Medical History:  Diagnosis Date  . Bipolar disorder (HCC)   . Chronic kidney disease   . Diabetes mellitus without complication (HCC)   . Hypercholesteremia   . Manic behavior (HCC)     Patient Active Problem List   Diagnosis Date Noted  . Schizoaffective disorder (HCC) 04/11/2015  . Elevated cholesterol 04/11/2015  . Diabetes mellitus type 2, diet-controlled (HCC) 04/11/2015    Past Surgical History:  Procedure Laterality Date  . COLONOSCOPY WITH PROPOFOL N/A 03/11/2015   Procedure: COLONOSCOPY WITH PROPOFOL;  Surgeon: Wallace CullensPaul Y Oh, MD;  [facility];  Service: Gastroenterology;  Laterality: N/A;    Prior to Admission medications   Medication Sig Start Date End Date Taking? Authorizing Provider  aspirin EC 81 MG tablet Take 81 mg by mouth daily.    Historical Provider, MD  calcium citrate-vitamin D (CITRACAL+D) 315-200 MG-UNIT tablet Take 1 tablet by mouth 2 (two) times daily.    Historical Provider, MD  carbamazepine (TEGRETOL) 200 MG tablet Take 200 mg by mouth 2 (two) times daily.      Historical Provider, MD  cholecalciferol (VITAMIN D) 400 UNITS TABS tablet Take 400 Units by mouth daily.    Historical Provider, MD  metFORMIN (GLUCOPHAGE) 500 MG tablet Take 1 tablet (500 mg total) by mouth 2 (two) times daily with a meal. 05/14/16 05/14/17  Emily FilbertJonathan E Williams, MD  propranolol (INDERAL) 20 MG tablet Take 20 mg by mouth 2 (two) times daily.     Historical Provider, MD  QUEtiapine (SEROQUEL) 300 MG tablet Take 300 mg by mouth at bedtime.    Historical Provider, MD  rosuvastatin (CRESTOR) 40 MG tablet Take 40 mg by mouth daily.    Historical Provider, MD  vitamin C (ASCORBIC ACID) 500 MG tablet Take 500 mg by mouth daily.    Historical Provider, MD  ziprasidone (GEODON) 60 MG capsule Take 60 mg by mouth 2 (two) times daily with a meal.     Historical Provider, MD  zonisamide (ZONEGRAN) 100 MG capsule Take 100 mg by mouth 2 (two) times daily.    Historical Provider, MD    Allergies  Allergen Reactions  . Divalproex Sodium     "bad for my liver"  . Penicillins Rash    Family History  Problem Relation Age of Onset  . Diabetes Father   . Diabetes Sister     Social History Social History  Substance Use Topics  . Smoking status: Never Smoker  . Smokeless tobacco: Never Used  . Alcohol use 1.8 oz/week    3 Glasses  of wine per week     Comment: Occ    Review of Systems Constitutional: Negative for fever. Cardiovascular: Negative for chest pain. Respiratory: Negative for shortness of breath. Gastrointestinal: Negative for abdominal pain Neurological: Negative for headache 10-point ROS otherwise negative.  ____________________________________________   PHYSICAL EXAM:  VITAL SIGNS: ED Triage Vitals  Enc Vitals Group     BP 07/29/16 1841 128/82     Pulse Rate 07/29/16 1841 100     Resp 07/29/16 1841 18     Temp 07/29/16 1841 97.8 F (36.6 C)     Temp Source 07/29/16 1841 Oral     SpO2 07/29/16 1841 96 %     Weight 07/29/16 1842 180 lb (81.6 kg)      Height 07/29/16 1842 5\' 2"  (1.575 m)     Head Circumference --      Peak Flow --      Pain Score --      Pain Loc --      Pain Edu? --      Excl. in GC? --     Constitutional: Alert, very anxious appearing, no acute distress. Eyes: Normal exam ENT   Head: Normocephalic and atraumatic.   Mouth/Throat: Mucous membranes are moist. Cardiovascular: Normal rate, regular rhythm. No murmur Respiratory: Normal respiratory effort without tachypnea nor retractions. Breath sounds are clear and equal bilaterally. No wheezes/rales/rhonchi. Gastrointestinal: Soft and nontender. No distention.   Musculoskeletal: Nontender with normal range of motion in all extremities.  Neurologic:  Normal speech and language. No gross focal neurologic deficits  Skin:  Skin is warm, dry and intact.  Psychiatric: Anxious appearing, does appear manic.    INITIAL IMPRESSION / ASSESSMENT AND PLAN / ED COURSE  Pertinent labs & imaging results that were available during my care of the patient were reviewed by me and considered in my medical decision making (see chart for details).  The patient presents the emergency department with concerns of possible mania. Patient states she has not slept, cannot diminish exact timeframe. Denies any substance use patient patient does appear quite anxious on exam, with quick responses and reactions very consistent with mania. We will treat with trazodone and Ativan, we'll have the patient seen by psychiatry. At this time the patient is able to answer my questions, she denies SI or HI. I do not believe she meets IVC criteria at this time but she is willing to stay here voluntarily to talk to psychiatry.  Patient's medical workup is largely nonrevealing. Psychiatric evaluation pending. Patient care signed out to oncoming physician.  ____________________________________________   FINAL CLINICAL IMPRESSION(S) / ED DIAGNOSES  Mania    Minna Antis, MD 07/29/16 [phone]

## 2016-07-29 NOTE — ED Notes (Signed)
Pt lying on stretcher, given pillow. Turned lights down. Pt still has not urinated, hat in toilet to catch urine when pt needs to urinate.

## 2016-07-29 NOTE — ED Notes (Signed)
Hat placed in room toilet; pt unable to give void at this time.

## 2016-07-29 NOTE — ED Notes (Signed)
Pt repeatedly coming to doorway, easily re-directed back into room.

## 2016-07-29 NOTE — ED Notes (Signed)
Pt is alert and oriented on admission and is peasant and cooperative with staff. Pt endorses AH but not at this time and denies SI/HI. Pt mood is depressed and affect is restricted. Pt has some thought blocking but is communicating effectively with staff. Writer oriented pt to the milieu and awaiting medications from pharmacy. 15 Minute checks are ongoing for safety.

## 2016-07-29 NOTE — BH Assessment (Signed)
Assessment Note  Rita Sullivan is an 61 y.o. female presenting to the ED with concerns of manic behavior.  Patient reports not being able to think clearly and to "get her head right".  She reports racing thoughts and not being able to concentrate.  Pt reports she sees Dr. Omelia Sullivan and has been taking her prescriptions.  She reports needing to see Dr. Toni Sullivan because "he is a nice man and can fix me".  Pt denies any drug/alcohol use.  Pt denies SI/HI and any auditory/visual hallucinations.  Pt would often taken long pauses before answering questions.  Diagnosis: Manic behavior  Past Medical History:  Past Medical History:  Diagnosis Date  . Bipolar disorder (HCC)   . Chronic kidney disease   . Diabetes mellitus without complication (HCC)   . Hypercholesteremia   . Manic behavior (HCC)     Past Surgical History:  Procedure Laterality Date  . COLONOSCOPY WITH PROPOFOL N/A 03/11/2015   Procedure: COLONOSCOPY WITH PROPOFOL;  Surgeon: Wallace Cullens, MD;  Location: [facility] ENDOSCOPY;  Service: Gastroenterology;  Laterality: N/A;    Family History:  Family History  Problem Relation Age of Onset  . Diabetes Father   . Diabetes Sister     Social History:  reports that she has never smoked. She has never used smokeless tobacco. She reports that she drinks about 1.8 oz of alcohol per week . She reports that she does not use drugs.  Additional Social History:  Alcohol / Drug Use Pain Medications: See PTA Prescriptions: See PTA Over the Counter: See PTA History of alcohol / drug use?: No history of alcohol / drug abuse  CIWA: CIWA-Ar BP: 113/76 Pulse Rate: 85 COWS:    Allergies:  Allergies  Allergen Reactions  . Divalproex Sodium     "bad for my liver"  . Penicillins Rash    Home Medications:  (Not in a hospital admission)  OB/GYN Status:  No LMP recorded. Patient is postmenopausal.  General Assessment Data Location of Assessment: [facility] ED TTS Assessment: In system Is this a Tele  or Face-to-Face Assessment?: Face-to-Face Is this an Initial Assessment or a Re-assessment for this encounter?: Initial Assessment Marital status: Married [place] name: na Is patient pregnant?: No Pregnancy Status: No Living Arrangements: Spouse/significant other Can pt return to current living arrangement?: Yes Admission Status: Voluntary Is patient capable of signing voluntary admission?: Yes Referral Source: Self/Family/Friend Insurance type: Medicare     Crisis Care Plan Living Arrangements: Spouse/significant other Legal Guardian: Other: (self) Name of Psychiatrist: Dr. Omelia Sullivan Name of Therapist: Dr. Omelia Sullivan  Education Status Is patient currently in school?: No Current Grade: na Highest grade of school patient has completed: na Name of school: na Contact person: na  Risk to self with the past 6 months Suicidal Ideation: No Has patient been a risk to self within the past 6 months prior to admission? : No Suicidal Intent: No Has patient had any suicidal intent within the past 6 months prior to admission? : No Is patient at risk for suicide?: No Suicidal Plan?: No Has patient had any suicidal plan within the past 6 months prior to admission? : No Access to Means: No What has been your use of drugs/alcohol within the last 12 months?: Pt denies drug use Previous Attempts/Gestures: No Other Self Harm Risks: None identified Triggers for Past Attempts: None known Intentional Self Injurious Behavior: None Family Suicide History: No Recent stressful life event(s): Other (Comment) Persecutory voices/beliefs?: No Depression: Yes Depression Symptoms: Despondent Substance abuse  history and/or treatment for substance abuse?: No Suicide prevention information given to non-admitted patients: Not applicable  Risk to Others within the past 6 months Homicidal Ideation: No Does patient have any lifetime risk of violence toward others beyond the six months prior to admission? :  No Thoughts of Harm to Others: No Current Homicidal Intent: No Current Homicidal Plan: No Access to Homicidal Means: No Identified Victim: None identified History of harm to others?: No Assessment of Violence: None Noted Violent Behavior Description: None identified Does patient have access to weapons?: No Criminal Charges Pending?: No Does patient have a court date: No Is patient on probation?: No  Psychosis Hallucinations: None noted Delusions: None noted  Mental Status Report Appearance/Hygiene: In scrubs Eye Contact: Good Motor Activity: Freedom of movement Speech: Slow Level of Consciousness: Alert Mood: Anxious Affect: Anxious Anxiety Level: Minimal Thought Processes: Relevant Judgement: Unimpaired Orientation: Person, Place, Time, Situation Obsessive Compulsive Thoughts/Behaviors: None  Cognitive Functioning Concentration: Fair Memory: Recent Intact IQ: Average Insight: Good Impulse Control: Good Appetite: Good Sleep: No Change Vegetative Symptoms: None  ADLScreening [facility] Assessment Services) Patient's cognitive ability adequate to safely complete daily activities?: Yes Patient able to express need for assistance with ADLs?: Yes Independently performs ADLs?: Yes (appropriate for developmental age)  Prior Inpatient Therapy Prior Inpatient Therapy: No Prior Therapy Dates: na Prior Therapy Facilty/Provider(s): na Reason for Treatment: na  Prior Outpatient Therapy Prior Outpatient Therapy: Yes Prior Therapy Dates: current Prior Therapy Facilty/Provider(s): Dr. Omelia [place] Reason for Treatment: anxiety Does patient have an ACCT team?: No Does patient have Intensive In-House Services?  : No Does patient have [facility] services? : No Does patient have P4CC services?: No  ADL Screening (condition at time of admission) Patient's cognitive ability adequate to safely complete daily activities?: Yes Patient able to express need for assistance with ADLs?:  Yes Independently performs ADLs?: Yes (appropriate for developmental age)       Abuse/Neglect Assessment (Assessment to be complete while patient is alone) Physical Abuse: Denies Verbal Abuse: Denies Sexual Abuse: Denies Exploitation of patient/patient's resources: Denies Self-Neglect: Denies Values / Beliefs Cultural Requests During Hospitalization: None Spiritual Requests During Hospitalization: None Consults Spiritual Care Consult Needed: No Social Work Consult Needed: No      Additional Information 1:1 In Past 12 Months?: No CIRT Risk: No Elopement Risk: No Does patient have medical clearance?: Yes     Disposition:  Disposition Initial Assessment Completed for this Encounter: Yes Disposition of Patient: Other dispositions Other disposition(s): Other (Comment) (Pending Psych MD consult)  On Site Evaluation by:   Reviewed with Physician:    Artist Beachoxana C Tyrion Glaude 07/29/2016 11:32 PM

## 2016-07-29 NOTE — ED Triage Notes (Signed)
States hx of bipolar, states "I am feeling manic". Husband states she tried to run away from the house today, denies SI or HI

## 2016-07-29 NOTE — ED Notes (Signed)
Pt denies SI or HI, pacing around room. Takes a moment to answer questions. Pt was happy to receive a [place] sandwich tray and water.

## 2016-07-29 NOTE — ED Notes (Signed)
Pt walking around room eating supper. Drinking water. Pt wandered out into hall, easily re-directed back into room.

## 2016-07-29 NOTE — ED Notes (Signed)
Husband took pts clothing and watch home

## 2016-07-30 ENCOUNTER — Inpatient Hospital Stay
Admission: AD | Admit: 2016-07-30 | Discharge: 2016-08-06 | DRG: 885 | Disposition: A | Discharge status: Home / Self Care | Payer: Medicare HMO | Source: Intra-hospital | Attending: Psychiatry | Admitting: Psychiatry

## 2016-07-30 ENCOUNTER — Encounter: Payer: Self-pay | Admitting: *Deleted

## 2016-07-30 DIAGNOSIS — Z23 Encounter for immunization: Secondary | ICD-10-CM | POA: Diagnosis present

## 2016-07-30 DIAGNOSIS — E1122 Type 2 diabetes mellitus with diabetic chronic kidney disease: Secondary | ICD-10-CM | POA: Diagnosis present

## 2016-07-30 DIAGNOSIS — G4700 Insomnia, unspecified: Secondary | ICD-10-CM | POA: Diagnosis present

## 2016-07-30 DIAGNOSIS — F250 Schizoaffective disorder, bipolar type: Secondary | ICD-10-CM | POA: Diagnosis present

## 2016-07-30 DIAGNOSIS — E785 Hyperlipidemia, unspecified: Secondary | ICD-10-CM

## 2016-07-30 DIAGNOSIS — F319 Bipolar disorder, unspecified: Secondary | ICD-10-CM | POA: Diagnosis present

## 2016-07-30 DIAGNOSIS — G2401 Drug induced subacute dyskinesia: Secondary | ICD-10-CM | POA: Diagnosis present

## 2016-07-30 DIAGNOSIS — F312 Bipolar disorder, current episode manic severe with psychotic features: Secondary | ICD-10-CM | POA: Diagnosis not present

## 2016-07-30 DIAGNOSIS — Z79899 Other long term (current) drug therapy: Secondary | ICD-10-CM | POA: Diagnosis not present

## 2016-07-30 DIAGNOSIS — E119 Type 2 diabetes mellitus without complications: Secondary | ICD-10-CM

## 2016-07-30 DIAGNOSIS — Z794 Long term (current) use of insulin: Secondary | ICD-10-CM | POA: Diagnosis not present

## 2016-07-30 DIAGNOSIS — N189 Chronic kidney disease, unspecified: Secondary | ICD-10-CM | POA: Diagnosis present

## 2016-07-30 DIAGNOSIS — Z7982 Long term (current) use of aspirin: Secondary | ICD-10-CM

## 2016-07-30 DIAGNOSIS — E559 Vitamin D deficiency, unspecified: Secondary | ICD-10-CM | POA: Diagnosis present

## 2016-07-30 DIAGNOSIS — G2571 Drug induced akathisia: Secondary | ICD-10-CM

## 2016-07-30 DIAGNOSIS — Z9119 Patient's noncompliance with other medical treatment and regimen: Secondary | ICD-10-CM

## 2016-07-30 DIAGNOSIS — F309 Manic episode, unspecified: Secondary | ICD-10-CM | POA: Diagnosis not present

## 2016-07-30 LAB — GLUCOSE, CAPILLARY
GLUCOSE-CAPILLARY: 271 mg/dL — AB (ref 65–99)
Glucose-Capillary: 241 mg/dL — ABNORMAL HIGH (ref 65–99)
Glucose-Capillary: 238 mg/dL — ABNORMAL HIGH (ref 65–99)

## 2016-07-30 MED ORDER — ASPIRIN EC 81 MG PO TBEC
81.0000 mg | DELAYED_RELEASE_TABLET | Freq: Every day | ORAL | Status: DC
  Administered 2016-07-31 – 2016-08-06 (×7): 81 mg via ORAL
  Filled 2016-07-30 (×7): qty 1

## 2016-07-30 MED ORDER — ZIPRASIDONE HCL 40 MG PO CAPS: 80.0000 mg | ORAL_CAPSULE | Freq: Every day | ORAL | Status: DC

## 2016-07-30 MED ORDER — PNEUMOCOCCAL VAC POLYVALENT 25 MCG/0.5ML IJ INJ
0.5000 mL | INJECTION | INTRAMUSCULAR | Status: AC
  Administered 2016-07-31: 0.5 mL via INTRAMUSCULAR
  Filled 2016-07-30: qty 0.5

## 2016-07-30 MED ORDER — ALUM & MAG HYDROXIDE-SIMETH 200-200-20 MG/5ML PO SUSP
30.0000 mL | ORAL | Status: DC | PRN
Start: 2016-07-30 — End: 2016-08-06

## 2016-07-30 MED ORDER — INFLUENZA VAC SPLIT QUAD 0.5 ML IM SUSY
0.5000 mL | PREFILLED_SYRINGE | INTRAMUSCULAR | Status: AC
  Administered 2016-07-31: 0.5 mL via INTRAMUSCULAR
  Filled 2016-07-30: qty 0.5

## 2016-07-30 MED ORDER — ROSUVASTATIN CALCIUM 20 MG PO TABS
40.0000 mg | ORAL_TABLET | Freq: Every day | ORAL | Status: DC
  Administered 2016-07-31 – 2016-08-06 (×7): 40 mg via ORAL
  Filled 2016-07-30: qty 1
  Filled 2016-07-30 (×2): qty 2
  Filled 2016-07-30: qty 1
  Filled 2016-07-30 (×5): qty 2

## 2016-07-30 MED ORDER — VITAMIN C 500 MG PO TABS
500.0000 mg | ORAL_TABLET | Freq: Every day | ORAL | Status: DC
  Administered 2016-07-31: 500 mg via ORAL
  Filled 2016-07-30: qty 1

## 2016-07-30 MED ORDER — ACETAMINOPHEN 325 MG PO TABS: 650.0000 mg | ORAL_TABLET | Freq: Four times a day (QID) | ORAL | Status: DC | PRN

## 2016-07-30 MED ORDER — METFORMIN HCL 500 MG PO TABS
500.0000 mg | ORAL_TABLET | Freq: Two times a day (BID) | ORAL | Status: DC
  Administered 2016-07-30 – 2016-08-01 (×4): 500 mg via ORAL
  Filled 2016-07-30 (×4): qty 1

## 2016-07-30 MED ORDER — CHOLECALCIFEROL 10 MCG (400 UNIT) PO TABS
400.0000 [IU] | ORAL_TABLET | Freq: Every day | ORAL | Status: DC
  Administered 2016-07-31 – 2016-08-06 (×8): 400 [IU] via ORAL
  Filled 2016-07-30 (×8): qty 1

## 2016-07-30 MED ORDER — MAGNESIUM HYDROXIDE 400 MG/5ML PO SUSP: 30.0000 mL | Freq: Every day | ORAL | Status: DC | PRN

## 2016-07-30 MED ORDER — TRAZODONE HCL 100 MG PO TABS
100.0000 mg | ORAL_TABLET | Freq: Every day | ORAL | Status: DC
  Administered 2016-07-30: 100 mg via ORAL
  Filled 2016-07-30: qty 1

## 2016-07-30 MED ORDER — ZONISAMIDE 100 MG PO CAPS
100.0000 mg | ORAL_CAPSULE | Freq: Two times a day (BID) | ORAL | Status: DC
  Administered 2016-07-31: 100 mg via ORAL
  Filled 2016-07-30 (×2): qty 1

## 2016-07-30 MED ORDER — CALCIUM CARBONATE-VITAMIN D 500-200 MG-UNIT PO TABS
1.0000 | ORAL_TABLET | Freq: Two times a day (BID) | ORAL | Status: DC
  Administered 2016-07-31 – 2016-08-06 (×13): 1 via ORAL
  Filled 2016-07-30 (×12): qty 1

## 2016-07-30 MED ORDER — PROPRANOLOL HCL 10 MG PO TABS
10.0000 mg | ORAL_TABLET | Freq: Once | ORAL | Status: AC
  Administered 2016-07-30: 10 mg via ORAL

## 2016-07-30 MED ORDER — ZIPRASIDONE HCL 20 MG PO CAPS: 80.0000 mg | ORAL_CAPSULE | Freq: Every day | ORAL | Status: DC

## 2016-07-30 MED ORDER — CARBAMAZEPINE 200 MG PO TABS
200.0000 mg | ORAL_TABLET | Freq: Two times a day (BID) | ORAL | Status: DC
  Administered 2016-07-30 – 2016-07-31 (×2): 200 mg via ORAL
  Filled 2016-07-30 (×2): qty 1

## 2016-07-30 MED ORDER — INSULIN ASPART 100 UNIT/ML ~~LOC~~ SOLN
0.0000 [IU] | Freq: Three times a day (TID) | SUBCUTANEOUS | Status: DC
  Administered 2016-07-30: 5 [IU] via SUBCUTANEOUS
  Administered 2016-07-31: 3 [IU] via SUBCUTANEOUS
  Administered 2016-07-31: 7 [IU] via SUBCUTANEOUS
  Administered 2016-07-31 – 2016-08-01 (×2): 3 [IU] via SUBCUTANEOUS
  Filled 2016-07-30: qty 7
  Filled 2016-07-30 (×3): qty 3
  Filled 2016-07-30: qty 5
  Filled 2016-07-30: qty 3

## 2016-07-30 MED ORDER — PROPRANOLOL HCL 10 MG PO TABS
20.0000 mg | ORAL_TABLET | Freq: Two times a day (BID) | ORAL | Status: DC
  Administered 2016-07-31 – 2016-08-06 (×13): 20 mg via ORAL
  Filled 2016-07-30 (×14): qty 2

## 2016-07-30 MED ORDER — FOLIC ACID 1 MG PO TABS
0.5000 mg | ORAL_TABLET | Freq: Every day | ORAL | Status: DC
  Administered 2016-07-31: 0.5 mg via ORAL
  Filled 2016-07-30: qty 1

## 2016-07-30 MED ORDER — TRAZODONE HCL 100 MG PO TABS: 100.0000 mg | ORAL_TABLET | Freq: Every evening | ORAL | Status: DC | PRN

## 2016-07-30 NOTE — ED Notes (Signed)
Patient resting quietly in room. No noted distress or abnormal behaviors noted. Will continue 15 minute checks and observation by security camera for safety. 

## 2016-07-30 NOTE — ED Provider Notes (Signed)
-----------------------------------------   6:54 AM on 07/30/2016 -----------------------------------------   Blood pressure 114/82, pulse 96, temperature 98.2 F (36.8 C), temperature source Oral, resp. rate 16, height 5\' 2"  (1.575 m), weight 180 lb (81.6 kg), SpO2 100 %.  The patient had no acute events since last update.  Calm and cooperative at this time.  Disposition is pending Psychiatry/Behavioral Medicine team recommendations.     Irean HongJade J Sung, MD 07/30/16 [phone]

## 2016-07-30 NOTE — [facility] Group Notes (Signed)
[facility] Group Notes:  (Nursing/MHT/Case Management/Adjunct)  Date:  07/30/2016  Time:  4:39 PM  Type of Therapy:  Psychoeducational Skills  Participation Level:  Did Not Attend   Rita Sullivan 07/30/2016, 4:39 PM

## 2016-07-30 NOTE — Consult Note (Signed)
[facility] Face-to-Face Psychiatry Consult   Reason for Consult:  Consult for 61 year old woman with a history of chronic mental health problems who came to the emergency room with her husband because of concerns that she was decompensating. Referring Physician:  Paduchowski Patient Identification: Rita Sullivan MRN:  [id number] Principal Diagnosis: Schizoaffective disorder [facility]) Diagnosis:   Patient Active Problem List   Diagnosis Date Noted  . Schizoaffective disorder ([place]) [F25.9] 04/11/2015  . Elevated cholesterol [E78.00] 04/11/2015  . Diabetes mellitus type 2, diet-controlled ([place]) [E11.9] 04/11/2015    Total Time spent with patient: 1 hour  Subjective:   Rita Sullivan is a 61 y.o. female patient admitted with "I'm feeling a little manic today".  HPI:  Patient interviewed. Chart reviewed. Patient known from previous encounters as well. Patient's husband brought her into the emergency room both of them concerned because she had tried to run away from the home yesterday. Patient can't explain why she did that. She said that that something that happens when she starts to feel bad. She tells [place] that she feels a little "manic" today but her behavior is more like someone who is psychotic and very withdrawn. She says that things of been getting worse for the last 3 weeks. She thinks she has started to hear voices again. Says that she's had vague thoughts of paranoia but can't describe it very well. Claims to be sleeping and eating okay for the most part. Says that she is taking her medicine regularly. Patient quotes just the Geodon Tegretol and propranolol as being her psychiatric medicine. Not using any alcohol or drugs. No thoughts of violence.  Social history: Patient lives with her husband. She is disabled. She denies that there is been any new psychosocial stress that she can think of.  Medical history: Patient has diabetes. Apparently it's usually managed with oral medication and diet. History  of elevated cholesterol.  Substance abuse history: She denies that she's been drinking or using any drugs. There was one previous time she came to the emergency room after having used alcohol but it doesn't seem that it's ever been documented as a regular problem.  Past Psychiatric History: Patient has long-standing mental health problems diagnosed as schizoaffective disorder. Has had previous hospitalizations. She sees Dr. Rosine Door who prescribes her medicine for her. She denies that she's ever tried to kill her self in the past. She has run away from home several times and had other bizarre and disorganized behaviors.  Risk to Self: Suicidal Ideation: No Suicidal Intent: No Is patient at risk for suicide?: No Suicidal Plan?: No Access to Means: No What has been your use of drugs/alcohol within the last 12 months?: Pt denies drug use Other Self Harm Risks: None identified Triggers for Past Attempts: None known Intentional Self Injurious Behavior: None Risk to Others: Homicidal Ideation: No Thoughts of Harm to Others: No Current Homicidal Intent: No Current Homicidal Plan: No Access to Homicidal Means: No Identified Victim: None identified History of harm to others?: No Assessment of Violence: None Noted Violent Behavior Description: None identified Does patient have access to weapons?: No Criminal Charges Pending?: No Does patient have a court date: No Prior Inpatient Therapy: Prior Inpatient Therapy: No Prior Therapy Dates: na Prior Therapy Facilty/Provider(s): na Reason for Treatment: na Prior Outpatient Therapy: Prior Outpatient Therapy: Yes Prior Therapy Dates: current Prior Therapy Facilty/Provider(s): Dr. Rosine Door Reason for Treatment: anxiety Does patient have an ACCT team?: No Does patient have Intensive In-House Services?  : No Does patient  have [facility] services? : No Does patient have P4CC services?: No  Past Medical History:  Past Medical History:  Diagnosis Date  .  Bipolar disorder ([place])   . Chronic kidney disease   . Diabetes mellitus without complication ([place])   . Hypercholesteremia   . Manic behavior ([place])     Past Surgical History:  Procedure Laterality Date  . COLONOSCOPY WITH PROPOFOL N/A 03/11/2015   Procedure: COLONOSCOPY WITH PROPOFOL;  Surgeon: Hulen Luster, MD;  Location: [facility] ENDOSCOPY;  Service: Gastroenterology;  Laterality: N/A;   Family History:  Family History  Problem Relation Age of Onset  . Diabetes Father   . Diabetes Sister    Family Psychiatric  History: She is not aware of any family history Social History:  History  Alcohol Use  . 1.8 oz/week  . 3 Glasses of wine per week    Comment: Occ     History  Drug Use No    Social History   Social History  . Marital status: Married    Spouse name: N/A  . Number of children: N/A  . Years of education: N/A   Social History Main Topics  . Smoking status: Never Smoker  . Smokeless tobacco: Never Used  . Alcohol use 1.8 oz/week    3 Glasses of wine per week     Comment: Occ  . Drug use: No  . Sexual activity: Not Asked   Other Topics Concern  . None   Social History Narrative  . None   Additional Social History:    Allergies:   Allergies  Allergen Reactions  . Divalproex Sodium     "bad for my liver"  . Penicillins Rash    Labs:  Results for orders placed or performed during the hospital encounter of 07/29/16 (from the past 48 hour(s))  Comprehensive metabolic panel     Status: Abnormal   Collection Time: 07/29/16  6:44 PM  Result Value Ref Range   Sodium 140 135 - 145 mmol/L   Potassium 3.5 3.5 - 5.1 mmol/L   Chloride 111 101 - 111 mmol/L   CO2 20 (L) 22 - 32 mmol/L   Glucose, Bld 328 (H) 65 - 99 mg/dL   BUN 15 6 - 20 mg/dL   Creatinine, Ser 0.63 0.44 - 1.00 mg/dL   Calcium 9.4 8.9 - 10.3 mg/dL   Total Protein 7.1 6.5 - 8.1 g/dL   Albumin 4.2 3.5 - 5.0 g/dL   AST 26 15 - 41 U/L   ALT 27 14 - 54 U/L   Alkaline Phosphatase 107 38 - 126 U/L    Total Bilirubin 0.4 0.3 - 1.2 mg/dL   GFR calc non Af Amer >60 >60 mL/min   GFR calc Af Amer >60 >60 mL/min    Comment: (NOTE) The eGFR has been calculated using the CKD EPI equation. This calculation has not been validated in all clinical situations. eGFR's persistently <60 mL/min signify possible Chronic Kidney Disease.    Anion gap 9 5 - 15  Ethanol     Status: None   Collection Time: 07/29/16  6:44 PM  Result Value Ref Range   Alcohol, Ethyl (B) <5 <5 mg/dL    Comment:        LOWEST DETECTABLE LIMIT FOR SERUM ALCOHOL IS 5 mg/dL FOR MEDICAL PURPOSES ONLY   Salicylate level     Status: None   Collection Time: 07/29/16  6:44 PM  Result Value Ref Range   Salicylate Lvl <0.3 2.8 -  30.0 mg/dL  Acetaminophen level     Status: Abnormal   Collection Time: 07/29/16  6:44 PM  Result Value Ref Range   Acetaminophen (Tylenol), Serum <10 (L) 10 - 30 ug/mL    Comment:        THERAPEUTIC CONCENTRATIONS VARY SIGNIFICANTLY. A RANGE OF 10-30 ug/mL MAY BE AN EFFECTIVE CONCENTRATION FOR MANY PATIENTS. HOWEVER, SOME ARE BEST TREATED AT CONCENTRATIONS OUTSIDE THIS RANGE. ACETAMINOPHEN CONCENTRATIONS >150 ug/mL AT 4 HOURS AFTER INGESTION AND >50 ug/mL AT 12 HOURS AFTER INGESTION ARE OFTEN ASSOCIATED WITH TOXIC REACTIONS.   cbc     Status: Abnormal   Collection Time: 07/29/16  6:44 PM  Result Value Ref Range   WBC 11.3 (H) 3.6 - 11.0 K/uL   RBC 4.18 3.80 - 5.20 MIL/uL   Hemoglobin 13.2 12.0 - 16.0 g/dL   HCT 38.9 35.0 - 47.0 %   MCV 93.3 80.0 - 100.0 fL   MCH 31.5 26.0 - 34.0 pg   MCHC 33.8 32.0 - 36.0 g/dL   RDW 13.3 11.5 - 14.5 %   Platelets 315 150 - 440 K/uL  Glucose, capillary     Status: Abnormal   Collection Time: 07/29/16 11:05 PM  Result Value Ref Range   Glucose-Capillary 331 (H) 65 - 99 mg/dL  Glucose, capillary     Status: Abnormal   Collection Time: 07/30/16  7:52 AM  Result Value Ref Range   Glucose-Capillary 241 (H) 65 - 99 mg/dL  Glucose, capillary     Status:  Abnormal   Collection Time: 07/30/16 11:39 AM  Result Value Ref Range   Glucose-Capillary 238 (H) 65 - 99 mg/dL    Current Facility-Administered Medications  Medication Dose Route Frequency Provider Last Rate Last Dose  . aspirin EC tablet 81 mg  81 mg Oral Daily Harvest Dark, MD   81 mg at 07/30/16 1004  . calcium-vitamin D (OSCAL WITH D) 500-200 MG-UNIT per tablet 1 tablet  1 tablet Oral BID Harvest Dark, MD   1 tablet at 07/30/16 1004  . carbamazepine (TEGRETOL) tablet 200 mg  200 mg Oral BID Harvest Dark, MD   200 mg at 07/30/16 3154  . cholecalciferol (VITAMIN D) tablet 400 Units  400 Units Oral Daily Harvest Dark, MD   400 Units at 07/30/16 1004  . folic acid (FOLVITE) tablet 0.5 mg  0.5 mg Oral Daily Harvest Dark, MD   0.5 mg at 07/30/16 1003  . insulin aspart (novoLOG) injection 0-9 Units  0-9 Units Subcutaneous TID WC Paulette Blanch, MD   3 Units at 07/30/16 1216  . metFORMIN (GLUCOPHAGE) tablet 500 mg  500 mg Oral BID WC Harvest Dark, MD   500 mg at 07/30/16 0086  . propranolol (INDERAL) tablet 20 mg  20 mg Oral BID Harvest Dark, MD   20 mg at 07/30/16 1004  . rosuvastatin (CRESTOR) tablet 40 mg  40 mg Oral Daily Harvest Dark, MD   40 mg at 07/30/16 1004  . traZODone (DESYREL) tablet 100 mg  100 mg Oral QHS Harvest Dark, MD   100 mg at 07/29/16 2138  . vitamin C (ASCORBIC ACID) tablet 500 mg  500 mg Oral Daily Harvest Dark, MD   500 mg at 07/30/16 1004  . [START ON 07/31/2016] ziprasidone (GEODON) capsule 80 mg  80 mg Oral Q supper Gonzella Lex, MD      . zonisamide (ZONEGRAN) capsule 100 mg  100 mg Oral BID Harvest Dark, MD   100 mg at 07/30/16  1004   Current Outpatient Prescriptions  Medication Sig Dispense Refill  . aspirin EC 81 MG tablet Take 81 mg by mouth daily.    . calcium citrate-vitamin D (CITRACAL+D) 315-200 MG-UNIT tablet Take 1 tablet by mouth 2 (two) times daily.    . carbamazepine (TEGRETOL) 200 MG tablet Take 200  mg by mouth 2 (two) times daily.     . cholecalciferol (VITAMIN D) 400 UNITS TABS tablet Take 400 Units by mouth daily.    . folic acid (FOLVITE) 073 MCG tablet Take 400 mcg by mouth daily.    . metFORMIN (GLUCOPHAGE) 500 MG tablet Take 1 tablet (500 mg total) by mouth 2 (two) times daily with a meal. 60 tablet 11  . propranolol (INDERAL) 20 MG tablet Take 20 mg by mouth 2 (two) times daily.     . QUEtiapine (SEROQUEL) 300 MG tablet Take 300 mg by mouth at bedtime.     . rosuvastatin (CRESTOR) 40 MG tablet Take 40 mg by mouth daily.    . vitamin C (ASCORBIC ACID) 500 MG tablet Take 500 mg by mouth daily.    . ziprasidone (GEODON) 20 MG capsule Take 20 mg by mouth 3 (three) times daily.    Marland Kitchen zonisamide (ZONEGRAN) 100 MG capsule Take 100 mg by mouth 2 (two) times daily.      Musculoskeletal: Strength & Muscle Tone: within normal limits Gait & Station: normal Patient leans: N/A  Psychiatric Specialty Exam: Physical Exam  Nursing note and vitals reviewed. Constitutional: She appears well-developed and well-nourished.  HENT:  Head: Normocephalic and atraumatic.  Eyes: Conjunctivae are normal. Pupils are equal, round, and reactive to light.  Neck: Normal range of motion.  Cardiovascular: Regular rhythm and normal heart sounds.   Respiratory: Effort normal. No respiratory distress.  GI: Soft.  Musculoskeletal: Normal range of motion.  Neurological: She is alert.  Skin: Skin is warm and dry.  Psychiatric: Her mood appears anxious. Her affect is blunt. Her speech is delayed. She is slowed and withdrawn. Thought content is paranoid. She expresses impulsivity. She exhibits a depressed mood. She expresses no homicidal and no suicidal ideation. She is noncommunicative. She exhibits abnormal recent memory. She is inattentive.    Review of Systems  Constitutional: Positive for malaise/fatigue.  HENT: Negative.   Eyes: Negative.   Respiratory: Negative.   Cardiovascular: Negative.    Gastrointestinal: Negative.   Musculoskeletal: Negative.   Skin: Negative.   Neurological: Negative.   Psychiatric/Behavioral: Positive for depression, hallucinations and memory loss. Negative for substance abuse and suicidal ideas. The patient is nervous/anxious and has insomnia.     Blood pressure 106/69, pulse 86, temperature 98.2 F (36.8 C), temperature source Oral, resp. rate 16, height 5' 2"  (1.575 m), weight 81.6 kg (180 lb), SpO2 100 %.Body mass index is 32.92 kg/m.  General Appearance: Disheveled  Eye Contact:  Minimal  Speech:  Blocked  Volume:  Decreased  Mood:  Dysphoric  Affect:  Constricted and Flat  Thought Process:  Disorganized  Orientation:  Full (Time, Place, and Person)  Thought Content:  Illogical, Paranoid Ideation, Rumination and Tangential  Suicidal Thoughts:  No  Homicidal Thoughts:  No  Memory:  Immediate;   Good Recent;   Fair Remote;   Fair  Judgement:  Fair  Insight:  Fair  Psychomotor Activity:  Decreased  Concentration:  Concentration: Fair  Recall:  [employer] of Knowledge:  Fair  Language:  Fair  Akathisia:  No  Handed:  Right  AIMS (if  indicated):     Assets:  Desire for Improvement Financial Resources/Insurance Housing Resilience Social Support  ADL's:  Intact  Cognition:  Impaired,  Mild  Sleep:        Treatment Plan Summary: Daily contact with patient to assess and evaluate symptoms and progress in treatment, Medication management and Plan 61 year old woman with chronic psychotic disorder appears to be decompensating. Although she describes herself as being "manic" her presentation is actually very withdrawn and slowed. She has extreme thought blocking. Loses her train of thought very easily. Looks distracted as though she were having hallucinations. Patient will be admitted to the psychiatry unit and she is quite agreeable to that. She tells me the only antipsychotic she has been taking Geodon and that is all I will continue.  Seroquel was can in unit by emergency room based on previous medicines the patient says she has not been taking it recently. She does tell me that she normally takes her Geodon at bedtime which may be a reason why at the level could be low. In any case she will be admitted to psychiatry. 15 minute checks. Labs.  Disposition: Recommend psychiatric Inpatient admission when medically cleared. Supportive therapy provided about ongoing stressors.  Alethia Berthold, MD 07/30/2016 12:43 PM

## 2016-07-30 NOTE — ED Notes (Signed)
Pt HR is rapid and slightly irregular this AM. MD notified and Propranolol 10 mg administered. 20 mg to be given at 10 AM. Pt is otherwise asymptomatic and slept well.

## 2016-07-30 NOTE — ED Notes (Signed)
[facility]  Pt accepting. No concerns / needs at this time. Maintained on 15 minute checks and observation by security camera for safety.

## 2016-07-30 NOTE — ED Notes (Signed)
Pt pleasant and cooperative.  Pt stated she was in the hospital because "I'm feeling very manic today." Pt stated she has not slept for a couple of days. Denies SI/HI. Pt stated she has AH, but not currently.  Pt feels she needs to be admitted to the hospital.

## 2016-07-30 NOTE — [facility] Group Notes (Signed)
[facility] Group Notes:  (Nursing/MHT/Case Management/Adjunct)  Date:  07/30/2016  Time:  9:49 PM  Type of Therapy:  Evening Wrap-up Group  Participation Level:  Did Not Attend  Participation Quality:  N/A  Affect:  N/A  Cognitive:  N/A  Insight:  None  Engagement in Group:  Did Not Attend  Modes of Intervention:  Discussion  Summary of Progress/Problems:  Tomasita MorrowChelsea Nanta Mohmed Farver 07/30/2016, 9:49 PM

## 2016-07-30 NOTE — ED Notes (Signed)
Pt will be transferred to [facility]. Report called to [place] Park[place], Charity fundraiserN. Pt accepting. All belongings will be sent with pt. No concerns / needs at this time. Maintained on 15 minute checks and observation by security camera for safety.

## 2016-07-30 NOTE — BH Assessment (Addendum)
[facility] by Dr. Toni Amendlapacs.  Attending Physician will be Dr. Ardyth HarpsHernandez.   Patient has been [facility] to room 310, [facility] Charge [facility] [place].   Intake Paper Work has been signed and placed on patient chart.  ER staff is aware of the admission Elder Love([place], ER Sect.; Amy B., Patient's Nurse & [place],  Patient Access).

## 2016-07-30 NOTE — ED Notes (Addendum)
Pt discharged to BMU. Pt signed voluntary consent form. Pt's husband took home pt's clothing and watch.

## 2016-07-30 NOTE — ED Notes (Signed)
Pt given lunch tray. Pt has been compliant with all medications. No concerns or needs voiced. Maintained on 15 minute checks and observation by security camera for safety.

## 2016-07-30 NOTE — ED Notes (Signed)
Pt given breakfast tray

## 2016-07-30 NOTE — Tx Team (Signed)
Initial Treatment Plan 07/30/2016 5:35 PM Rita ChristmasLaura K Gosney EAV:[id number]    PATIENT STRESSORS: Loud noises People   PATIENT STRENGTHS: Motivation for treatment/growth Supportive family/friends   PATIENT IDENTIFIED PROBLEMS: Psychosis  Hallucinations                   DISCHARGE CRITERIA:  Improved stabilization in mood, thinking, and/or behavior  PRELIMINARY DISCHARGE PLAN: Outpatient therapy  PATIENT/FAMILY INVOLVEMENT: This treatment plan has been presented to and reviewed with the patient, Rita Sullivan, and/or family member, .  The patient and family have been given the opportunity to ask questions and make suggestions.  Shelia MediaJones, Lyndel Sarate, RN 07/30/2016, 5:35 PM

## 2016-07-30 NOTE — Progress Notes (Signed)
Patient admitted to unit for psychosis. Pt pleasant and cooperative with care. Reports she came in due to feeling manic, and  having trouble eating and sleeping. Pt denies SI, HI, VH, endorses auditory hallucinations at times. Denies any history of abuse. Oriented patient to room and unit. Skin and contraband search completed and witnessed by [facility], and [place], RN, no skin issues noted, no contraband found. Admission assessment completed. Fluids and nutrition offered. Pt remains safe on unit and 15 min checks continues.

## 2016-07-31 ENCOUNTER — Encounter: Payer: Self-pay | Admitting: Psychiatry

## 2016-07-31 DIAGNOSIS — G2571 Drug induced akathisia: Secondary | ICD-10-CM

## 2016-07-31 DIAGNOSIS — E559 Vitamin D deficiency, unspecified: Secondary | ICD-10-CM

## 2016-07-31 DIAGNOSIS — E785 Hyperlipidemia, unspecified: Secondary | ICD-10-CM

## 2016-07-31 DIAGNOSIS — F312 Bipolar disorder, current episode manic severe with psychotic features: Secondary | ICD-10-CM

## 2016-07-31 LAB — GLUCOSE, CAPILLARY
GLUCOSE-CAPILLARY: 312 mg/dL — AB (ref 65–99)
GLUCOSE-CAPILLARY: 290 mg/dL — AB (ref 65–99)
Glucose-Capillary: 231 mg/dL — ABNORMAL HIGH (ref 65–99)
Glucose-Capillary: 250 mg/dL — ABNORMAL HIGH (ref 65–99)

## 2016-07-31 LAB — LIPID PANEL
CHOLESTEROL: 179 mg/dL (ref 0–200)
HDL: 51 mg/dL (ref >40)
LDL Cholesterol: 101 mg/dL — ABNORMAL HIGH (ref 0–99)
TRIGLYCERIDES: 136 mg/dL (ref <150)
Total CHOL/HDL Ratio: 3.5 RATIO
VLDL: 27 mg/dL (ref 0–40)

## 2016-07-31 LAB — TSH: TSH: 2.446 u[IU]/mL (ref 0.350–4.500)

## 2016-07-31 LAB — CARBAMAZEPINE LEVEL, TOTAL: CARBAMAZEPINE LVL: 5.3 ug/mL (ref 4.0–12.0)

## 2016-07-31 MED ORDER — CARBAMAZEPINE 200 MG PO TABS
400.0000 mg | ORAL_TABLET | Freq: Two times a day (BID) | ORAL | Status: DC
  Administered 2016-07-31 – 2016-08-06 (×12): 400 mg via ORAL
  Filled 2016-07-31 (×12): qty 2

## 2016-07-31 MED ORDER — LORAZEPAM 1 MG PO TABS
1.0000 mg | ORAL_TABLET | Freq: Every evening | ORAL | Status: DC | PRN
  Filled 2016-07-31 (×2): qty 1

## 2016-07-31 MED ORDER — INSULIN ASPART 100 UNIT/ML ~~LOC~~ SOLN
0.0000 [IU] | Freq: Every day | SUBCUTANEOUS | Status: DC
  Administered 2016-07-31: 3 [IU] via SUBCUTANEOUS
  Filled 2016-07-31: qty 3

## 2016-07-31 MED ORDER — LORAZEPAM 1 MG PO TABS
1.0000 mg | ORAL_TABLET | Freq: Every day | ORAL | Status: DC
  Administered 2016-07-31 – 2016-08-01 (×2): 1 mg via ORAL
  Filled 2016-07-31 (×2): qty 1

## 2016-07-31 MED ORDER — INSULIN ASPART 100 UNIT/ML ~~LOC~~ SOLN
0.0000 [IU] | Freq: Three times a day (TID) | SUBCUTANEOUS | Status: DC
Start: 2016-07-31 — End: 2016-08-01
  Administered 2016-08-01: 3 [IU] via SUBCUTANEOUS

## 2016-07-31 MED ORDER — ZIPRASIDONE HCL 40 MG PO CAPS
60.0000 mg | ORAL_CAPSULE | Freq: Two times a day (BID) | ORAL | Status: DC
  Administered 2016-07-31 – 2016-08-02 (×4): 60 mg via ORAL
  Filled 2016-07-31 (×4): qty 1

## 2016-07-31 NOTE — Progress Notes (Signed)
Inpatient Diabetes Program Recommendations  AACE/ADA: New Consensus Statement on Inpatient Glycemic Control (2015)  Target Ranges:  Prepandial:   less than 140 mg/dL      Peak postprandial:   less than 180 mg/dL (1-2 hours)      Critically ill patients:  140 - 180 mg/dL   Results for Rita Sullivan, Rita Sullivan (MRN [id number]) as of 07/31/2016 10:20  Ref. Range 07/30/2016 07:52 07/30/2016 11:39 07/30/2016 17:00  Glucose-Capillary Latest Ref Range: 65 - 99 mg/dL 782241 (H) 956238 (H) 213271 (H)   Results for Rita Sullivan, Rita Sullivan (MRN [id number]) as of 07/31/2016 10:20  Ref. Range 07/31/2016 06:38  Glucose-Capillary Latest Ref Range: 65 - 99 mg/dL 629231 (H)    Admit with: Schizoaffective Disorder  History: DM2  Home DM Meds: Metformin 500 mg BID  Current Insulin Orders: Metformin 500 mg BID      Novolog Sensitive Correction Scale/ SSI (0-9 units) TID AC    MD- Please consider the following in-hospital insulin adjustments:  1. Increase Metformin to 1000 mg BID  2. Start Novolog Meal Coverage: Novolog 3 units TID with meals (hold if pt eats <50% of meal)     --Will follow patient during hospitalization--  Ambrose FinlandJeannine Johnston Onaje Warne RN, MSN, CDE Diabetes Coordinator Inpatient Glycemic Control Team Team Pager: [phone] (8a-5p)

## 2016-07-31 NOTE — B[facility] Counselor (Signed)
Adult Comprehensive Assessment  Patient ID: Noel ChristmasLaura K Sibrian, female   DOB: 1955-07-17, 61 y.o.   MRN: [id number]  Information Source: Information source: Patient  Current Stressors:  Educational / Learning stressors: n/a Employment / Job issues: Pt is unemployed Family Relationships: n/a Surveyor, quantityinancial / Lack of resources (include bankruptcy): n/a Housing / Lack of housing: n/a Physical health (include injuries & life threatening diseases): n/a Social relationships: n/a Substance abuse: Patient denies Bereavement / Loss: Mother died two years ago.  Living/Environment/Situation:  Living Arrangements: Spouse/significant other Living conditions (as described by patient or guardian): Pt states it is fine.  How long has patient lived in current situation?: About 15 years. What is atmosphere in current home: Comfortable, Loving, Supportive  Family History:  Marital status: Married Number of Years Married: 25 What types of issues is patient dealing with in the relationship?: n/a Additional relationship information: n/a Are you sexually active?: Yes What is your sexual orientation?: heterosexual Has your sexual activity been affected by drugs, alcohol, medication, or emotional stress?: n/a Does patient have children?: Yes How many children?: 2 How is patient's relationship with their children?: 1 girl and 1 boy. [place] but patient states she only sees them during the holidays.   Childhood History:  By whom was/is the patient raised?: Both parents Additional childhood history information: n/a Description of patient's relationship with caregiver when they were a child: Pt states she had a pretty good childhood.  Patient's description of current relationship with people who raised him/her: Mother is deceased. Father is still living and they have a good relationship.  How were you disciplined when you got in trouble as a child/adolescent?: n/a Does patient have siblings?:  Yes Number of Siblings: 1 Description of patient's current relationship with siblings: 1 sister. Patient states she is not close with her sister stating "We just have two different personalities".  Did patient suffer any verbal/emotional/physical/sexual abuse as a child?: No Did patient suffer from severe childhood neglect?: No Has patient ever been sexually abused/assaulted/raped as an adolescent or adult?: No Was the patient ever a victim of a crime or a disaster?: No Witnessed domestic violence?: No Has patient been effected by domestic violence as an adult?: No  Education:  Highest grade of school patient has completed: Geographical information systems officerCollege degree in Counselling psychologistChemical Engineering from [employer] Currently a student?: No Name of school: n/a Learning disability?: No  Employment/Work Situation:   Employment situation: On disability Why is patient on disability: Mental Health How long has patient been on disability: 2003 Patient's job has been impacted by current illness: No What is the longest time patient has a held a job?: 14 years 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 Are These Weapons Safely Secured?:  (CSW will confirm with husband that guns are secured. )  Financial Resources:   Financial resources: [employer] SSDI, [employer]are, Income from spouse Does patient have a Lawyerrepresentative payee or guardian?: No  Alcohol/Substance Abuse:   What has been your use of drugs/alcohol within the last 12 months?: Patient denies If attempted suicide, did drugs/alcohol play a role in this?: No Alcohol/Substance Abuse Treatment Hx: Denies past history  Has  alcohol/substance abuse ever caused legal problems?: No  Social Support System:   Patient's Community Support System: Good Describe Community Support System: Husband Type of faith/religion: n/a How does patient's faith help to cope with current illness?: n/a  Leisure/Recreation:   Leisure and Hobbies: Glass blower/designer and painting  Strengths/Needs:   What things does the patient do well?: problem solving, engineering, math In what areas does patient struggle / problems for patient: anxiety, insomnia  Discharge Plan:   Does patient have access to transportation?: Yes Will patient be returning to same living situation after discharge?: Yes Currently receiving community mental health services: Yes (From Whom) ([facility]) Does patient have financial barriers related to discharge medications?: No  Summary/Recommendations:   Patient is a 60 year old female admitted voluntarily with a diagnosis of Schizoaffective disorder. Information was obtained from psychosocial assessment completed with patient and chart review conducted by this evaluator. Patient presented to the hospital reporting concerns of manic behavior. Patient reports primary triggers for admission were insomnia and stating she was having severe restlessness and anxiety. Patient is an established patient of Dr. Omelia Blackwater at [facility] in [place]. Patient has support from her husband. Patient will benefit from crisis stabilization, medication evaluation, group therapy and psycho education in addition to case management for discharge. At discharge, it is recommended that patient remain compliant with established discharge plan and continued treatment.   Meridith Romick G. Garnette Czech MSW, LCSWA 07/31/2016 9:50 AM

## 2016-07-31 NOTE — Progress Notes (Signed)
Recreation Therapy Notes  Date: 03.09.18 Time: 9:30 am Location: Craft Room  Group Topic: Coping Skills  Goal Area(s) Addresses:  Patient will participate in healthy coping skill. Patient will verbalize benefit of using art as a coping skill.  Behavioral Response: Arrived late, Attentive, Left early  Intervention: Coloring  Activity: Patients were given coloring sheets to color and were instructed to think about the emotion they were feeling and what their minds were focused on.  Education: LRT educated patients on healthy coping skills.  Education Outcome: Patient left before LRT educated group.  Clinical Observations/Feedback: Patient arrived to group at approximately 10:05 am. LRT explained activity. Patient colored coloring sheet. Patient did not contribute to group discussion. Patient left group at approximately 10:15 am with Dr. Ardyth HarpsHernandez and did not return to group.  Jacquelynn CreeGreene,Serrita Lueth M, LRT/CTRS 07/31/2016 10:30 AM

## 2016-07-31 NOTE — Plan of Care (Signed)
Problem: Safety: Goal: Ability to remain free from injury will improve Outcome: Not Met (add Reason) Patient is anxious. Visible in dayroom. Attends group. Med compliant. No PRNs. Med adm w/education. No voiced thoughts of hurting herself. Safety maintained with q 15 min checks.

## 2016-07-31 NOTE — Progress Notes (Signed)
61 year old woman admitted on 07/30/16. Received this morning AAOx4, calm and compliant, disheveled. Accepted her medications without incident. Denies SI/HI/AVH at time of assessment. No complaints at this time, will continue to monitor and encourage appropriate interactions on the unit.

## 2016-07-31 NOTE — H&P (Signed)
Psychiatric Admission Assessment Adult  Patient Identification: Rita Sullivan MRN:  [id number] Date of Evaluation:  07/31/2016 Chief Complaint:  bipolar Principal Diagnosis: Bipolar disorder, current episode manic severe with psychotic features [facility](HCC) Diagnosis:   Patient Active Problem List   Diagnosis Date Noted  . Dyslipidemia [E78.5] 07/31/2016  . Bipolar disorder, current episode manic severe with psychotic features (HCC) [F31.2] 07/31/2016  . Akathisia [G25.71] 07/31/2016  . Vitamin D deficiency [E55.9] 07/31/2016  . Diabetes mellitus type 2, diet-controlled (HCC) [E11.9] 04/11/2015   History of Present Illness:  Patient is a 61 year old married Caucasian female from [place]. Patient came to our emergency department voluntarily on March 7 in the company of her husband. The patient complained of feeling "manic". Her husband reported that she had run away from the home earlier in the day.  Patient complained of not being able to think clearly. She stated she had racing thoughts and was unable to concentrate. Some staff in the emergency Department described her as having thought blocking.  Patient carries a diagnosis of bipolar disorder versus a schizoaffective disorder bipolar type. She follows up with [facility]aden at [facility]. Patient says that for the most part she is being compliant with her medications but thinks that over the last few  days she had missed a few doses.  Patient is currently prescribed with Tegretol, Zonegran, Seroquel and Geodon.  Patient was family limited historian today. She was pleasant and cooperative. She says that for the last couple weeks she has been having significant trouble sleeping. She is only resting about one hour a night. She says she stays up reading books. She also has been hearing voices of the nature planetary influences---gods talking to her trying to tell her to make changes in the world. She feels that her energy and her  mood are elevated. She denies suicidality or homicidality and she also denies having visual hallucinations.  As far as substance abuse he denies having any history of substance use. She does not smoke cigarettes  Trauma history she denies  Associated Signs/Symptoms: Depression Symptoms:  insomnia, psychomotor agitation, (Hypo) Manic Symptoms:  Elevated Mood, Hallucinations, Impulsivity, Anxiety Symptoms:  Excessive Worry, Psychotic Symptoms:  Hallucinations: Auditory PTSD Symptoms: Negative Total Time spent with patient: 1 hour  Past Psychiatric History:  Patient states she is being hospitalized a multitude of time since the age of 61 when she was first diagnosed with bipolar disorder. She says that her last hospitalization was here in 2015. She denies any history of self injury or suicidal attempts  Is the patient at risk to self? Yes.    Has the patient been a risk to self in the past 6 months? No.  Has the patient been a risk to self within the distant past? No.  Is the patient a risk to others? No.  Has the patient been a risk to others in the past 6 months? No.  Has the patient been a risk to others within the distant past? No.    Alcohol Screening: 1. How often do you have a drink containing alcohol?: 2 to 3 times a week 2. How many drinks containing alcohol do you have on a typical day when you are drinking?: 1 or 2 3. How often do you have six or more drinks on one occasion?: Never Preliminary Score: 0 9. Have you or someone else been injured as a result of your drinking?: No 10. Has a relative or friend or a doctor or another health  worker been concerned about your drinking or suggested you cut down?: No Alcohol Use Disorder Identification Test Final Score (AUDIT): 3 Brief Intervention: AUDIT score less than 7 or less-screening does not suggest unhealthy drinking-brief intervention not indicated  Past Medical History:  Patient denies any history of seizures or head  trauma. Past Medical History:  Diagnosis Date  . Bipolar disorder (HCC)   . Chronic kidney disease   . Diabetes mellitus without complication (HCC)   . Hypercholesteremia   . Manic behavior (HCC)     Past Surgical History:  Procedure Laterality Date  . COLONOSCOPY WITH PROPOFOL N/A 03/11/2015   Procedure: COLONOSCOPY WITH PROPOFOL;  Surgeon: Wallace Cullens, MD;  Location: [facility] ENDOSCOPY;  Service: Gastroenterology;  Laterality: N/A;   Family History:  Family History  Problem Relation Age of Onset  . Diabetes Father   . Diabetes Sister    Family Psychiatric  History: Patient denies having any family members with history of suicides, mental illness or substance abuse  Tobacco Screening: Have you used any form of tobacco in the last 30 days? (Cigarettes, Smokeless Tobacco, Cigars, and/or Pipes): No   Social History:  Patient lives with her husband of 29 years. They have 2 kids together ages 25 and 76. Patient is currently disabled due to bipolar disorder. As far as her education she has a bachelor's degree. 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 History  Alcohol Use  . 1.2 oz/week  . 2 Glasses of wine per week    Comment: Occ     History  Drug Use No    Additional Social History: Marital status: Married Number of Years Married: 25 What types of issues is patient dealing with in the relationship?: n/a Additional relationship information: n/a Are you sexually active?: Yes What is your sexual orientation?: heterosexual Has your sexual activity been affected by drugs, alcohol, medication, or emotional stress?: n/a Does patient have children?: Yes How many children?: 2 How is patient's relationship with their children?: 1 girl and 1 boy. Both live [place] but patient states she only sees them during the holidays.       Allergies:   Allergies  Allergen Reactions  . Beef-Derived Products     Does not eat beef   . Divalproex Sodium     "bad for my liver"  .  Pork-Derived Products     Does not eat pork  . Penicillins Rash   Lab Results:  Results for orders placed or performed during the hospital encounter of 07/30/16 (from the past 48 hour(s))  Glucose, capillary     Status: Abnormal   Collection Time: 07/30/16  5:00 PM  Result Value Ref Range   Glucose-Capillary 271 (H) 65 - 99 mg/dL  Glucose, capillary     Status: Abnormal   Collection Time: 07/31/16  6:38 AM  Result Value Ref Range   Glucose-Capillary 231 (H) 65 - 99 mg/dL  Lipid panel     Status: Abnormal   Collection Time: 07/31/16  6:42 AM  Result Value Ref Range   Cholesterol 179 0 - 200 mg/dL   Triglycerides 782 <956 mg/dL   HDL 51 >21 mg/dL   Total CHOL/HDL Ratio 3.5 RATIO   VLDL 27 0 - 40 mg/dL   LDL Cholesterol 308 (H) 0 - 99 mg/dL    Comment:        Total Cholesterol/HDL:CHD Risk Coronary Heart Disease Risk Table  Men   Women  1/2 Average Risk   3.4   3.3  Average Risk       5.0   4.4  2 X Average Risk   9.6   7.1  3 X Average Risk  23.4   11.0        Use the calculated Patient Ratio above and the CHD Risk Table to determine the patient's CHD Risk.        ATP III CLASSIFICATION (LDL):  <100     mg/dL   Optimal  161-096  mg/dL   Near or Above                    Optimal  130-159  mg/dL   Borderline  045-409  mg/dL   High  >811     mg/dL   Very High   TSH     Status: None   Collection Time: 07/31/16  6:42 AM  Result Value Ref Range   TSH 2.446 0.350 - 4.500 uIU/mL    Comment: Performed by a 3rd Generation assay with a functional sensitivity of <=0.01 uIU/mL.  Carbamazepine level, total     Status: None   Collection Time: 07/31/16  6:42 AM  Result Value Ref Range   Carbamazepine Lvl 5.3 4.0 - 12.0 ug/mL  Glucose, capillary     Status: Abnormal   Collection Time: 07/31/16 11:36 AM  Result Value Ref Range   Glucose-Capillary 312 (H) 65 - 99 mg/dL    Blood Alcohol level:  Lab Results  Component Value Date   ETH <5 07/29/2016   ETH 134  (H) 05/14/2016    Metabolic Disorder Labs:  No results found for: HGBA1C, MPG No results found for: PROLACTIN Lab Results  Component Value Date   CHOL 179 07/31/2016   TRIG 136 07/31/2016   HDL 51 07/31/2016   CHOLHDL 3.5 07/31/2016   VLDL 27 07/31/2016   LDLCALC 101 (H) 07/31/2016    Current Medications: Current Facility-Administered Medications  Medication Dose Route Frequency Provider Last Rate Last Dose  . acetaminophen (TYLENOL) tablet 650 mg  650 mg Oral Q6H PRN Audery Amel, MD      . alum & mag hydroxide-simeth (MAALOX/MYLANTA) 200-200-20 MG/5ML suspension 30 mL  30 mL Oral Q4H PRN Audery Amel, MD      . aspirin EC tablet 81 mg  81 mg Oral Daily Audery Amel, MD   81 mg at 07/31/16 0815  . calcium-vitamin D (OSCAL WITH D) 500-200 MG-UNIT per tablet 1 tablet  1 tablet Oral BID Audery Amel, MD   1 tablet at 07/31/16 0815  . carbamazepine (TEGRETOL) tablet 400 mg  400 mg Oral BID Jimmy Footman, MD      . cholecalciferol (VITAMIN D) tablet 400 Units  400 Units Oral Daily Audery Amel, MD   400 Units at 07/31/16 0800  . insulin aspart (novoLOG) injection 0-5 Units  0-5 Units Subcutaneous QHS Jimmy Footman, MD      . insulin aspart (novoLOG) injection 0-9 Units  0-9 Units Subcutaneous TID WC Audery Amel, MD   7 Units at 07/31/16 1140  . insulin aspart (novoLOG) injection 0-9 Units  0-9 Units Subcutaneous TID WC Jimmy Footman, MD      . LORazepam (ATIVAN) tablet 1 mg  1 mg Oral QHS Jimmy Footman, MD      . LORazepam (ATIVAN) tablet 1 mg  1 mg Oral QHS PRN Jimmy Footman, MD      .  magnesium hydroxide (MILK OF MAGNESIA) suspension 30 mL  30 mL Oral Daily PRN Audery Amel, MD      . metFORMIN (GLUCOPHAGE) tablet 500 mg  500 mg Oral BID WC Audery Amel, MD   500 mg at 07/31/16 0815  . propranolol (INDERAL) tablet 20 mg  20 mg Oral BID Audery Amel, MD   20 mg at 07/31/16 0815  . rosuvastatin (CRESTOR)  tablet 40 mg  40 mg Oral Daily Audery Amel, MD   40 mg at 07/31/16 1140  . ziprasidone (GEODON) capsule 60 mg  60 mg Oral BID WC Jimmy Footman, MD       PTA Medications: Prescriptions Prior to Admission  Medication Sig Dispense Refill Last Dose  . aspirin EC 81 MG tablet Take 81 mg by mouth daily.   Taking  . calcium citrate-vitamin D (CITRACAL+D) 315-200 MG-UNIT tablet Take 1 tablet by mouth 2 (two) times daily.   Taking  . carbamazepine (TEGRETOL) 200 MG tablet Take 200 mg by mouth 2 (two) times daily.    Taking  . cholecalciferol (VITAMIN D) 400 UNITS TABS tablet Take 400 Units by mouth daily.   Taking  . folic acid (FOLVITE) 400 MCG tablet Take 400 mcg by mouth daily.     . metFORMIN (GLUCOPHAGE) 500 MG tablet Take 1 tablet (500 mg total) by mouth 2 (two) times daily with a meal. 60 tablet 11   . propranolol (INDERAL) 20 MG tablet Take 20 mg by mouth 2 (two) times daily.    Taking  . QUEtiapine (SEROQUEL) 300 MG tablet Take 300 mg by mouth at bedtime.    Taking  . rosuvastatin (CRESTOR) 40 MG tablet Take 40 mg by mouth daily.   Taking  . vitamin C (ASCORBIC ACID) 500 MG tablet Take 500 mg by mouth daily.   Taking  . ziprasidone (GEODON) 20 MG capsule Take 20 mg by mouth 3 (three) times daily.     Marland Kitchen zonisamide (ZONEGRAN) 100 MG capsule Take 100 mg by mouth 2 (two) times daily.   Taking    Musculoskeletal: Strength & Muscle Tone: within normal limits Gait & Station: normal Patient leans: N/A  Psychiatric Specialty Exam: Physical Exam  Constitutional: She is oriented to person, place, and time. She appears well-developed and well-nourished.  HENT:  Head: Normocephalic and atraumatic.  Eyes: Conjunctivae and EOM are normal.  Neck: Normal range of motion.  Respiratory: Effort normal.  Musculoskeletal: Normal range of motion.  Neurological: She is alert and oriented to person, place, and time.    Review of Systems  Constitutional: Negative.   HENT: Negative.    Eyes: Negative.   Respiratory: Negative.   Cardiovascular: Negative.   Gastrointestinal: Negative.   Genitourinary: Negative.   Musculoskeletal: Negative.   Skin: Negative.   Neurological: Negative.   Endo/Heme/Allergies: Negative.     Blood pressure 115/80, pulse 90, temperature 98.5 F (36.9 C), temperature source Oral, resp. rate 18, height 5\' 2"  (1.575 m), weight 80.1 kg (176 lb 8 oz), SpO2 99 %.Body mass index is 32.28 kg/m.  General Appearance: Fairly Groomed  Eye Contact:  Good  Speech:  Clear and Coherent  Volume:  Normal  Mood:  Euphoric  Affect:  Constricted  Thought Process:  Linear and Descriptions of Associations: Intact  Orientation:  Full (Time, Place, and Person)  Thought Content:  Hallucinations: Auditory  Suicidal Thoughts:  No  Homicidal Thoughts:  No  Memory:  Immediate;   Fair Recent;   Fair  Remote;   Fair  Judgement:  Impaired  Insight:  Shallow  Psychomotor Activity:  Decreased  Concentration:  Concentration: Poor and Attention Span: Poor  Recall:  Poor  Fund of Knowledge:  Fair  Language:  Good  Akathisia:  No  Handed:    AIMS (if indicated):     Assets:  Manufacturing systems engineer Physical Health Social Support  ADL's:  Intact  Cognition:  Impaired,  Mild  Sleep:  Number of Hours: 7.3   Treatment plan  Patient is a 61 year old Caucasian female with bipolar disorder who presented to our emergency department with a manic episode and psychotic features.  Triggers for this episode appeared to be noncompliance  Bipolar disorder type I patient will be continued on Tegretol however I will increase the dose to 400 mg twice a day instead of 200 mg twice a day.  Geodon will be continued but I will increase the dose to 60 mg twice a day instead of her home dose of 20 mg 3 times a day.  Zonegran will be discontinued  Seroquel will be discontinued  Akathisia: Patient reports prior history of akathisia with Geodon will continue Inderal 20 mg twice a  day  Tardive dyskinesia: Patient has significant involuntary movements in her mouth  Insomnia I will order Ativan 1 mg by mouth daily at bedtime  Vitamin D deficiency continue vitamin D 400 units a day and Citra cal daily  Diabetes continue metformin 500 mg twice a day. Patient is on a carb modified diet. I have place orders for supplemental insulin  Dyslipidemia continue Crestor 40 mg a day  Labs I will order hemoglobin A1c, lipid panel and TSH  Diet carb modified  Vital signs daily  Hospitalization status voluntary  Disposition she will be discharged back to her home once stable  F/U continue to follow-up with Dr. Omelia Blackwater  Records were reviewed from prior hospitalization here in 2015. She was discharged with a diagnosis of bipolar disorder.  Physician Treatment Plan for Primary Diagnosis: Bipolar disorder, current episode manic severe with psychotic features (HCC) Long Term Goal(s): Improvement in symptoms so as ready for discharge  Short Term Goals: Ability to identify and develop effective coping behaviors will improve and Ability to identify triggers associated with substance abuse/mental health issues will improve  Physician Treatment Plan for Secondary Diagnosis: Principal Problem:   Bipolar disorder, current episode manic severe with psychotic features (HCC) Active Problems:   Diabetes mellitus type 2, diet-controlled (HCC)   Dyslipidemia   Akathisia   Vitamin D deficiency  Long Term Goal(s): Improvement in symptoms so as ready for discharge  Short Term Goals: Ability to identify changes in lifestyle to reduce recurrence of condition will improve and Ability to verbalize feelings will improve  I certify that inpatient services furnished can reasonably be expected to improve the patient's condition.    Jimmy Footman, MD 3/9/201812:23 PM

## 2016-07-31 NOTE — Progress Notes (Signed)
Recreation Therapy Notes  INPATIENT RECREATION THERAPY ASSESSMENT  Patient Details Name: Rita Sullivan MRN: [id number] DOB: 1955/05/31 Today's Date: 07/31/2016  Patient Stressors: Other (Comment) (Trouble thinking clearly)  Coping Skills:   Arguments, Exercise, Art/Dance, Music  Personal Challenges: Social Interaction, Stress Management, Time Management, Trusting Others  Leisure Interests (2+):  Art - Paint, Individual - Other (Comment) ([place])  Awareness of Community Resources:  Yes  Community Resources:  [employer], Other (Comment) ([employer] - painting class)  Current Use: Yes  If no, Barriers?:    Patient Strengths:  Hair, attitude  Patient Identified Areas of Improvement:  Social skills  Current Recreation Participation:  Eating peanut butter crackers  Patient Goal for Hospitalization:  Get some new medication and work on anxiety coping skills  [place]  County of Residence:  [place]   Current SI (including self-harm):  No  Current HI:  No  Consent to Intern Participation: N/A   Jacquelynn CreeGreene,Jasher Barkan M, LRT/CTRS 07/31/2016, 5:05 PM

## 2016-07-31 NOTE — [facility] Suicide Risk Assessment (Signed)
[facility]   Nursing information obtained from:  Patient Demographic factors:  Caucasian Current Mental Status:  NA Loss Factors:  NA Historical Factors:  NA Risk Reduction Factors:  Positive social support  Total Time spent with patient: 1 hour Principal Problem: Bipolar disorder, current episode manic severe with psychotic features (HCC) Diagnosis:   Patient Active Problem List   Diagnosis Date Noted  . Dyslipidemia [E78.5] 07/31/2016  . Bipolar disorder, current episode manic severe with psychotic features (HCC) [F31.2] 07/31/2016  . Akathisia [G25.71] 07/31/2016  . Diabetes mellitus type 2, diet-controlled (HCC) [E11.9] 04/11/2015   Subjective Data:   Continued Clinical Symptoms:  Alcohol Use Disorder Identification Test Final Score (AUDIT): 3 The "Alcohol Use Disorders Identification Test", Guidelines for Use in Primary Care, Second Edition.  World Science writerHealth Organization [facility](WHO). Score between 0-7:  no or low risk or alcohol related problems. Score between 8-15:  moderate risk of alcohol related problems. Score between 16-19:  high risk of alcohol related problems. Score 20 or above:  warrants further diagnostic evaluation for alcohol dependence and treatment.   CLINICAL FACTORS:   Severe Anxiety and/or Agitation Currently Psychotic Previous Psychiatric Diagnoses and Treatments    Psychiatric Specialty Exam: Physical Exam  ROS  Blood pressure 115/80, pulse 90, temperature 98.5 F (36.9 C), temperature source Oral, resp. rate 18, height 5\' 2"  (1.575 m), weight 80.1 kg (176 lb 8 oz), SpO2 99 %.Body mass index is 32.28 kg/m.                                                    Sleep:  Number of Hours: 7.3      COGNITIVE FEATURES THAT CONTRIBUTE TO RISK:  Loss of executive function    SUICIDE RISK:   Moderate:  Frequent suicidal ideation with limited intensity, and duration, some specificity in terms of plans, no  associated intent, good self-control, limited dysphoria/symptomatology, some risk factors present, and identifiable protective factors, including available and accessible social support.  [facility]  I certify that inpatient services furnished can reasonably be expected to improve the patient's condition.   Jimmy FootmanHernandez-Gonzalez,  Arrow Tomko, MD 07/31/2016, 10:53 AM

## 2016-07-31 NOTE — [facility] Group Notes (Signed)
[facility] LCSW Group Therapy Note  Date/Time: 07/31/16, 1300  Type of Therapy and Topic:  Group Therapy:  Feelings around Relapse and Recovery  Participation Level:  Minimal   Mood: quiet  Description of Group:    Patients in this group will discuss emotions they experience before and after a relapse. They will process how experiencing these feelings, or avoidance of experiencing them, relates to having a relapse. Facilitator will guide patients to explore emotions they have related to recovery. Patients will be encouraged to process which emotions are more powerful. They will be guided to discuss the emotional reaction significant others in their lives may have to patients' relapse or recovery. Patients will be assisted in exploring ways to respond to the emotions of others without this contributing to a relapse.  Therapeutic Goals: 1. Patient will identify two or more emotions that lead to relapse for them:  2. Patient will identify two emotions that result when they relapse:  3. Patient will identify two emotions related to recovery:  4. Patient will demonstrate ability to communicate their needs through discussion and/or role plays.   Summary of Patient Progress: Pt attended group but made no contributions to the discussion.  She appeared attentive to what others were saying but did not identify any feelings or otherwise participate.     Therapeutic Modalities:   Cognitive Behavioral Therapy Solution-Focused Therapy Assertiveness Training Relapse Prevention Therapy  Daleen SquibbGreg Oreatha Fabry, LCSW

## 2016-07-31 NOTE — [facility] Suicide Risk Assessment (Signed)
[facility] INPATIENT:  Family/Significant Other Suicide Prevention Education  Suicide Prevention Education:  Education Completed;  Burnadette PeterZolayvar,Ruperto B Spouse [phone]    has been identified by the patient as the family member/significant other with whom the patient will be residing, and identified as the person(s) who will aid the patient in the event of a mental health crisis (suicidal ideations/suicide attempt).  With written consent from the patient, the family member/significant other has been provided the following suicide prevention education, prior to the and/or following the discharge of the patient.  The suicide prevention education provided includes the following:  Suicide risk factors  Suicide prevention and interventions  National Suicide Hotline telephone number  [facility] assessment telephone number  [facility] Emergency Assistance 911  [facility] and/or [facility]idential Mobile Crisis [facility] telephone number  Request made of family/significant other to:  Remove weapons (e.g., guns, rifles, knives), all items previously/currently identified as safety concern.    Remove drugs/medications (over-the-counter, prescriptions, illicit drugs), all items previously/currently identified as a safety concern.  The family member/significant other verbalizes understanding of the suicide prevention education information provided.  The family member/significant other agrees to remove the items of safety concern listed above.  Husband reports pt has gotten in her car and driven long distances when she is psychotic in the past.  She has driven to [place], [place], and [place].  Her current episode has lasted about a week.  Lorri FrederickWierda, Chey Cho Jon, LCSW 07/31/2016, 3:29 PM

## 2016-07-31 NOTE — Tx Team (Signed)
Interdisciplinary Treatment and Diagnostic Plan Update  07/31/2016 Time of Session: 11:00am Rita Sullivan MRN: [id number]  Principal Diagnosis: Bipolar disorder, current episode manic severe with psychotic features [facility](HCC)  Secondary Diagnoses: Principal Problem:   Bipolar disorder, current episode manic severe with psychotic features (HCC) Active Problems:   Diabetes mellitus type 2, diet-controlled (HCC)   Dyslipidemia   Akathisia   Vitamin D deficiency   Current Medications:  Current Facility-Administered Medications  Medication Dose Route Frequency Provider Last Rate Last Dose  . acetaminophen (TYLENOL) tablet 650 mg  650 mg Oral Q6H PRN Audery AmelJohn T Clapacs, MD      . alum & mag hydroxide-simeth (MAALOX/MYLANTA) 200-200-20 MG/5ML suspension 30 mL  30 mL Oral Q4H PRN Audery AmelJohn T Clapacs, MD      . aspirin EC tablet 81 mg  81 mg Oral Daily Audery AmelJohn T Clapacs, MD   81 mg at 07/31/16 0815  . calcium-vitamin D (OSCAL WITH D) 500-200 MG-UNIT per tablet 1 tablet  1 tablet Oral BID Audery AmelJohn T Clapacs, MD   1 tablet at 07/31/16 0815  . carbamazepine (TEGRETOL) tablet 400 mg  400 mg Oral BID Jimmy FootmanAndrea Hernandez-Gonzalez, MD      . cholecalciferol (VITAMIN D) tablet 400 Units  400 Units Oral Daily Audery AmelJohn T Clapacs, MD   400 Units at 07/31/16 0800  . insulin aspart (novoLOG) injection 0-5 Units  0-5 Units Subcutaneous QHS Jimmy FootmanAndrea Hernandez-Gonzalez, MD      . insulin aspart (novoLOG) injection 0-9 Units  0-9 Units Subcutaneous TID WC Audery AmelJohn T Clapacs, MD   7 Units at 07/31/16 1140  . insulin aspart (novoLOG) injection 0-9 Units  0-9 Units Subcutaneous TID WC Jimmy FootmanAndrea Hernandez-Gonzalez, MD      . LORazepam (ATIVAN) tablet 1 mg  1 mg Oral QHS Jimmy FootmanAndrea Hernandez-Gonzalez, MD      . LORazepam (ATIVAN) tablet 1 mg  1 mg Oral QHS PRN Jimmy FootmanAndrea Hernandez-Gonzalez, MD      . magnesium hydroxide (MILK OF MAGNESIA) suspension 30 mL  30 mL Oral Daily PRN Audery AmelJohn T Clapacs, MD      . metFORMIN (GLUCOPHAGE) tablet 500 mg  500 mg Oral BID WC  Audery AmelJohn T Clapacs, MD   500 mg at 07/31/16 0815  . propranolol (INDERAL) tablet 20 mg  20 mg Oral BID Audery AmelJohn T Clapacs, MD   20 mg at 07/31/16 0815  . rosuvastatin (CRESTOR) tablet 40 mg  40 mg Oral Daily Audery AmelJohn T Clapacs, MD   40 mg at 07/31/16 1140  . ziprasidone (GEODON) capsule 60 mg  60 mg Oral BID WC Jimmy FootmanAndrea Hernandez-Gonzalez, MD       PTA Medications: Prescriptions Prior to Admission  Medication Sig Dispense Refill Last Dose  . aspirin EC 81 MG tablet Take 81 mg by mouth daily.   Taking  . calcium citrate-vitamin D (CITRACAL+D) 315-200 MG-UNIT tablet Take 1 tablet by mouth 2 (two) times daily.   Taking  . carbamazepine (TEGRETOL) 200 MG tablet Take 200 mg by mouth 2 (two) times daily.    Taking  . cholecalciferol (VITAMIN D) 400 UNITS TABS tablet Take 400 Units by mouth daily.   Taking  . folic acid (FOLVITE) 400 MCG tablet Take 400 mcg by mouth daily.     . metFORMIN (GLUCOPHAGE) 500 MG tablet Take 1 tablet (500 mg total) by mouth 2 (two) times daily with a meal. 60 tablet 11   . propranolol (INDERAL) 20 MG tablet Take 20 mg by mouth 2 (two) times daily.  Taking  . QUEtiapine (SEROQUEL) 300 MG tablet Take 300 mg by mouth at bedtime.    Taking  . rosuvastatin (CRESTOR) 40 MG tablet Take 40 mg by mouth daily.   Taking  . vitamin C (ASCORBIC ACID) 500 MG tablet Take 500 mg by mouth daily.   Taking  . ziprasidone (GEODON) 20 MG capsule Take 20 mg by mouth 3 (three) times daily.     Marland Kitchen zonisamide (ZONEGRAN) 100 MG capsule Take 100 mg by mouth 2 (two) times daily.   Taking    Patient Stressors:    Patient Strengths: Motivation for treatment/growth Supportive family/friends  Treatment Modalities: Medication Management, Group therapy, Case management,  1 to 1 session with clinician, Psychoeducation, Recreational therapy.   Physician Treatment Plan for Primary Diagnosis: Bipolar disorder, current episode manic severe with psychotic features (HCC) Long Term Goal(s): Improvement in symptoms so  as ready for discharge Improvement in symptoms so as ready for discharge   Short Term Goals: Ability to identify and develop effective coping behaviors will improve Ability to identify triggers associated with substance abuse/mental health issues will improve Ability to identify changes in lifestyle to reduce recurrence of condition will improve Ability to verbalize feelings will improve  Medication Management: Evaluate patient's response, side effects, and tolerance of medication regimen.  Therapeutic Interventions: 1 to 1 sessions, Unit Group sessions and Medication administration.  Evaluation of Outcomes: Progressing  Physician Treatment Plan for Secondary Diagnosis: Principal Problem:   Bipolar disorder, current episode manic severe with psychotic features (HCC) Active Problems:   Diabetes mellitus type 2, diet-controlled (HCC)   Dyslipidemia   Akathisia   Vitamin D deficiency  Long Term Goal(s): Improvement in symptoms so as ready for discharge Improvement in symptoms so as ready for discharge   Short Term Goals: Ability to identify and develop effective coping behaviors will improve Ability to identify triggers associated with substance abuse/mental health issues will improve Ability to identify changes in lifestyle to reduce recurrence of condition will improve Ability to verbalize feelings will improve     Medication Management: Evaluate patient's response, side effects, and tolerance of medication regimen.  Therapeutic Interventions: 1 to 1 sessions, Unit Group sessions and Medication administration.  Evaluation of Outcomes: Not Progressing   RN Treatment Plan for Primary Diagnosis: Bipolar disorder, current episode manic severe with psychotic features (HCC) Long Term Goal(s): Knowledge of disease and therapeutic regimen to maintain health will improve  Short Term Goals: Ability to participate in decision making will improve, Ability to verbalize feelings will  improve, Ability to identify and develop effective coping behaviors will improve and Compliance with prescribed medications will improve  Medication Management: RN will administer medications as ordered by provider, will assess and evaluate patient's response and provide education to patient for prescribed medication. RN will report any adverse and/or side effects to prescribing provider.  Therapeutic Interventions: 1 on 1 counseling sessions, Psychoeducation, Medication administration, Evaluate responses to treatment, Monitor vital signs and CBGs as ordered, Perform/monitor CIWA, COWS, AIMS and Fall Risk screenings as ordered, Perform wound care treatments as ordered.  Evaluation of Outcomes: Progressing   LCSW Treatment Plan for Primary Diagnosis: Bipolar disorder, current episode manic severe with psychotic features (HCC) Long Term Goal(s): Safe transition to appropriate next level of care at discharge, Engage patient in therapeutic group addressing interpersonal concerns.  Short Term Goals: Engage patient in aftercare planning with referrals and resources, Increase emotional regulation and Increase skills for wellness and recovery  Therapeutic Interventions: Assess for all discharge needs,  1 to 1 time with Child psychotherapist, Explore available resources and support systems, Assess for adequacy in community support network, Educate family and significant other(s) on suicide prevention, Complete Psychosocial Assessment, Interpersonal group therapy.  Evaluation of Outcomes: Progressing    Recreational Therapy Treatment Plan for Primary Diagnosis: Bipolar disorder, current episode manic severe with psychotic features (HCC) Long Term Goal(s): Patient will participate in recreation therapy treatment in at least 2 group sessions without prompting from LRT  Short Term Goals: Increase time management skills, Increase stress management skills  Treatment Modalities: Group Therapy and Individual Treatment  Sessions  Therapeutic Interventions: Psychoeducation  Evaluation of Outcomes: Progressing   Progress in Treatment: Attending groups: No. Participating in groups: No. Taking medication as prescribed: Yes. Toleration medication: Yes. Family/Significant other contact made: No, will contact:  husband Patient understands diagnosis: Yes. Discussing patient identified problems/goals with staff: Yes. Medical problems stabilized or resolved: Yes. Denies suicidal/homicidal ideation: Yes. Issues/concerns per patient self-inventory: No. Other: n/a  New problem(s) identified: None identified at this time.   New Short Term/Long Term Goal(s): Goals identified by patient during treatment team: "I need to work on my anxiety".   Discharge Plan or Barriers: Patient will discharge home and follow-up with outpatient services.   Reason for Continuation of Hospitalization: Anxiety Medication stabilization  Estimated Length of Stay: 5 to 7 days.   Attendees: Patient: Rita Sullivan 07/31/2016 3:18 PM  Physician: Dr. Radene JourneyJayme Cloud, MD 07/31/2016 3:18 PM  Nursing: Leonia Reader, RN 07/31/2016 3:18 PM  RN Care Manager: 07/31/2016 3:18 PM  Social Worker: Fredrich Birks. Garnette Czech MSW, LCSWA 07/31/2016 3:18 PM  Recreational Therapist: Jacquelynn Cree, LRT/CTRS 07/31/2016 3:18 PM  Other:  07/31/2016 3:18 PM  Other:  07/31/2016 3:18 PM  Other: 07/31/2016 3:18 PM    Scribe for Treatment Team: Arelia Longest, LCSWA 07/31/2016 3:18 PM

## 2016-07-31 NOTE — Progress Notes (Signed)
Blood sugar 231 with coverage. Snacks offered and accepted. No s/s of hyper/hypoglycemia noted. Slept 7.30 hours.

## 2016-08-01 LAB — GLUCOSE, CAPILLARY
Glucose-Capillary: 227 mg/dL — ABNORMAL HIGH (ref 65–99)
Glucose-Capillary: 266 mg/dL — ABNORMAL HIGH (ref 65–99)
Glucose-Capillary: 267 mg/dL — ABNORMAL HIGH (ref 65–99)
Glucose-Capillary: 230 mg/dL — ABNORMAL HIGH (ref 65–99)

## 2016-08-01 LAB — URINE DRUG SCREEN, QUALITATIVE (ARMC ONLY)
AMPHETAMINES, UR SCREEN: NOT DETECTED
BENZODIAZEPINE, UR SCRN: NOT DETECTED
Barbiturates, Ur Screen: NOT DETECTED
Cannabinoid 50 Ng, Ur ~~LOC~~: NOT DETECTED
Cocaine Metabolite,Ur ~~LOC~~: NOT DETECTED
MDMA (ECSTASY) UR SCREEN: NOT DETECTED
METHADONE SCREEN, URINE: NOT DETECTED
Opiate, Ur Screen: NOT DETECTED
Phencyclidine (PCP) Ur S: NOT DETECTED
Tricyclic, Ur Screen: NOT DETECTED

## 2016-08-01 LAB — PROLACTIN: Prolactin: 3.6 ng/mL — ABNORMAL LOW (ref 4.8–23.3)

## 2016-08-01 LAB — HEMOGLOBIN A1C
HEMOGLOBIN A1C: 7.8 % — AB (ref 4.8–5.6)
MEAN PLASMA GLUCOSE: 177 mg/dL

## 2016-08-01 MED ORDER — METFORMIN HCL 500 MG PO TABS
1000.0000 mg | ORAL_TABLET | Freq: Two times a day (BID) | ORAL | Status: DC
  Administered 2016-08-01 – 2016-08-06 (×10): 1000 mg via ORAL
  Filled 2016-08-01 (×11): qty 2

## 2016-08-01 MED ORDER — INSULIN ASPART 100 UNIT/ML ~~LOC~~ SOLN: 0.0000 [IU] | Freq: Every day | SUBCUTANEOUS | Status: DC

## 2016-08-01 MED ORDER — INSULIN ASPART 100 UNIT/ML ~~LOC~~ SOLN
0.0000 [IU] | Freq: Every day | SUBCUTANEOUS | Status: DC
  Administered 2016-08-01: 2 [IU] via SUBCUTANEOUS
  Administered 2016-08-02: 3 [IU] via SUBCUTANEOUS
  Administered 2016-08-03 – 2016-08-05 (×2): 2 [IU] via SUBCUTANEOUS
  Filled 2016-08-01: qty 3
  Filled 2016-08-01: qty 2
  Filled 2016-08-01: qty 3

## 2016-08-01 MED ORDER — INSULIN ASPART 100 UNIT/ML ~~LOC~~ SOLN
3.0000 [IU] | Freq: Three times a day (TID) | SUBCUTANEOUS | Status: DC
  Administered 2016-08-01 – 2016-08-03 (×6): 3 [IU] via SUBCUTANEOUS
  Filled 2016-08-01 (×5): qty 3

## 2016-08-01 MED ORDER — INSULIN ASPART 100 UNIT/ML ~~LOC~~ SOLN: 0.0000 [IU] | Freq: Three times a day (TID) | SUBCUTANEOUS | Status: DC

## 2016-08-01 MED ORDER — INSULIN ASPART 100 UNIT/ML ~~LOC~~ SOLN
0.0000 [IU] | Freq: Three times a day (TID) | SUBCUTANEOUS | Status: DC
  Administered 2016-08-01 (×2): 5 [IU] via SUBCUTANEOUS
  Administered 2016-08-02: 3 [IU] via SUBCUTANEOUS
  Administered 2016-08-02: 2 [IU] via SUBCUTANEOUS
  Administered 2016-08-02: 5 [IU] via SUBCUTANEOUS
  Administered 2016-08-03: 3 [IU] via SUBCUTANEOUS
  Administered 2016-08-03: 2 [IU] via SUBCUTANEOUS
  Administered 2016-08-03 – 2016-08-05 (×7): 3 [IU] via SUBCUTANEOUS
  Administered 2016-08-06: 2 [IU] via SUBCUTANEOUS
  Filled 2016-08-01: qty 3
  Filled 2016-08-01: qty 2
  Filled 2016-08-01: qty 9
  Filled 2016-08-01: qty 3
  Filled 2016-08-01: qty 2
  Filled 2016-08-01 (×2): qty 3
  Filled 2016-08-01: qty 2
  Filled 2016-08-01: qty 3
  Filled 2016-08-01: qty 2
  Filled 2016-08-01 (×2): qty 5
  Filled 2016-08-01: qty 3

## 2016-08-01 NOTE — Progress Notes (Signed)
[facility] MD Progress Note  08/01/2016 1:21 PM Rita Sullivan  MRN:  [id number] Subjective: Patient is a 61 year old married Caucasian female from [facility] [place]. Patient came to our emergency department voluntarily on March 7 in the company of her husband. The patient complained of feeling "manic". Her husband reported that she had run away from the home earlier in the day.  Patient complained of not being able to think clearly. She stated she had racing thoughts and was unable to concentrate. Some staff in the emergency Department described her as having thought blocking.  Patient carries a diagnosis of bipolar disorder versus a schizoaffective disorder bipolar type. She follows up with Dr.Headen at [facility]. Patient says that for the most part she is being compliant with her medications but thinks that over the last few  days she had missed a few doses.  Patient is currently prescribed with Tegretol, Zonegran, Seroquel and Geodon.  Patient was family limited historian today. She was pleasant and cooperative. She says that for the last couple weeks she has been having significant trouble sleeping. She is only resting about one hour a night. She says she stays up reading books. She also has been hearing voices of the nature planetary influences---gods talking to her trying to tell her to make changes in the world. She feels that her energy and her mood are elevated. She denies suicidality or homicidality and she also denies having visual hallucinations.  As far as substance abuse he denies having any history of substance use. She does not smoke cigarettes  Trauma history she denies  3/10 Patient states she feels okay today. She attended many groups yesterday reports they are helping, she is learning about preventing relapse. Slept well, reports meds are helping, appetite is normal/avg. Denies SI/HI/side effects to meds.  Answer are vague and short. No really any  change since  admission  Per nursing Pt denies SI/HI/AVH. Attended evening group. Visible in milieu interacting appropriately with peers and staff. Denies pain. Medication compliant and voices no concerns at this time. Encouragement and support provided. Safety maintained. Will continue to monitor  Principal Problem: Bipolar disorder, current episode manic severe with psychotic features [facility]) Diagnosis:   Patient Active Problem List   Diagnosis Date Noted  . Dyslipidemia [E78.5] 07/31/2016  . Bipolar disorder, current episode manic severe with psychotic features (HCC) [F31.2] 07/31/2016  . Akathisia [G25.71] 07/31/2016  . Vitamin D deficiency [E55.9] 07/31/2016  . Diabetes mellitus type 2, diet-controlled (HCC) [E11.9] 04/11/2015   Total Time spent with patient: 30 minutes  Past Psychiatric History: Patient states she is being hospitalized a multitude of time since the age of 74 when she was first diagnosed with bipolar disorder. She says that her last hospitalization was here in 2015. She denies any history of self injury or suicidal attempts  Past Medical History:  Past Medical History:  Diagnosis Date  . Bipolar disorder (HCC)   . Chronic kidney disease   . Diabetes mellitus without complication (HCC)   . Hypercholesteremia   . Manic behavior (HCC)     Past Surgical History:  Procedure Laterality Date  . COLONOSCOPY WITH PROPOFOL N/A 03/11/2015   Procedure: COLONOSCOPY WITH PROPOFOL;  Surgeon: Wallace Cullens, MD;  Location: [facility] ENDOSCOPY;  Service: Gastroenterology;  Laterality: N/A;   Family History:  Family History  Problem Relation Age of Onset  . Diabetes Father   . Diabetes Sister    Family Psychiatric  History: Patient denies having any family members  with history of suicides, mental illness or substance abuse  Social History:  History  Alcohol Use  . 1.2 oz/week  . 2 Glasses of wine per week    Comment: Occ     History  Drug Use No    Social History   Social History  .  Marital status: Married    Spouse name: N/A  . Number of children: N/A  . Years of education: N/A   Social History Main Topics  . Smoking status: Never Smoker  . Smokeless tobacco: Never Used  . Alcohol use 1.2 oz/week    2 Glasses of wine per week     Comment: Occ  . Drug use: No  . Sexual activity: Not Asked   Other Topics Concern  . None   Social History Narrative  . None   Additional Social History:      Sleep: Fair  Appetite:  Fair  Current Medications: Current Facility-Administered Medications  Medication Dose Route Frequency Provider Last Rate Last Dose  . acetaminophen (TYLENOL) tablet 650 mg  650 mg Oral Q6H PRN Audery Amel, MD      . alum & mag hydroxide-simeth (MAALOX/MYLANTA) 200-200-20 MG/5ML suspension 30 mL  30 mL Oral Q4H PRN Audery Amel, MD      . aspirin EC tablet 81 mg  81 mg Oral Daily Audery Amel, MD   81 mg at 08/01/16 0824  . calcium-vitamin D (OSCAL WITH D) 500-200 MG-UNIT per tablet 1 tablet  1 tablet Oral BID Audery Amel, MD   1 tablet at 08/01/16 [phone]  . carbamazepine (TEGRETOL) tablet 400 mg  400 mg Oral BID Jimmy Footman, MD   400 mg at 08/01/16 0824  . cholecalciferol (VITAMIN D) tablet 400 Units  400 Units Oral Daily Audery Amel, MD   400 Units at 08/01/16 [phone]  . insulin aspart (novoLOG) injection 0-5 Units  0-5 Units Subcutaneous QHS Jimmy Footman, MD      . insulin aspart (novoLOG) injection 0-9 Units  0-9 Units Subcutaneous TID WC Jimmy Footman, MD   5 Units at 08/01/16 1230  . insulin aspart (novoLOG) injection 3 Units  3 Units Subcutaneous TID WC Jimmy Footman, MD   3 Units at 08/01/16 1229  . LORazepam (ATIVAN) tablet 1 mg  1 mg Oral QHS Jimmy Footman, MD   1 mg at 07/31/16 2132  . LORazepam (ATIVAN) tablet 1 mg  1 mg Oral QHS PRN Jimmy Footman, MD      . magnesium hydroxide (MILK OF MAGNESIA) suspension 30 mL  30 mL Oral Daily PRN Audery Amel, MD       . metFORMIN (GLUCOPHAGE) tablet 1,000 mg  1,000 mg Oral BID WC Jimmy Footman, MD      . propranolol (INDERAL) tablet 20 mg  20 mg Oral BID Audery Amel, MD   20 mg at 08/01/16 0844  . rosuvastatin (CRESTOR) tablet 40 mg  40 mg Oral Daily Audery Amel, MD   40 mg at 08/01/16 0824  . ziprasidone (GEODON) capsule 60 mg  60 mg Oral BID WC Jimmy Footman, MD   60 mg at 08/01/16 4782    Lab Results:  Results for orders placed or performed during the hospital encounter of 07/30/16 (from the past 48 hour(s))  Glucose, capillary     Status: Abnormal   Collection Time: 07/30/16  5:00 PM  Result Value Ref Range   Glucose-Capillary 271 (H) 65 - 99 mg/dL  Glucose, capillary  Status: Abnormal   Collection Time: 07/31/16  6:38 AM  Result Value Ref Range   Glucose-Capillary 231 (H) 65 - 99 mg/dL  Hemoglobin Z6X     Status: Abnormal   Collection Time: 07/31/16  6:42 AM  Result Value Ref Range   Hgb A1c MFr Bld 7.8 (H) 4.8 - 5.6 %    Comment: (NOTE)         Pre-diabetes: 5.7 - 6.4         Diabetes: >6.4         Glycemic control for adults with diabetes: <7.0    Mean Plasma Glucose 177 mg/dL    Comment: (NOTE) Performed At: [facility] [address] [place], [place] [id number] Mila Homer MD WJ:[id number]   Lipid panel     Status: Abnormal   Collection Time: 07/31/16  6:42 AM  Result Value Ref Range   Cholesterol 179 0 - 200 mg/dL   Triglycerides 213 <086 mg/dL   HDL 51 >57 mg/dL   Total CHOL/HDL Ratio 3.5 RATIO   VLDL 27 0 - 40 mg/dL   LDL Cholesterol 846 (H) 0 - 99 mg/dL    Comment:        Total Cholesterol/HDL:CHD Risk Coronary Heart Disease Risk Table                     Men   Women  1/2 Average Risk   3.4   3.3  Average Risk       5.0   4.4  2 X Average Risk   9.6   7.1  3 X Average Risk  23.4   11.0        Use the calculated Patient Ratio above and the CHD Risk Table to determine the patient's CHD Risk.        ATP III  CLASSIFICATION (LDL):  <100     mg/dL   Optimal  962-952  mg/dL   Near or Above                    Optimal  130-159  mg/dL   Borderline  841-324  mg/dL   High  >401     mg/dL   Very High   Prolactin     Status: Abnormal   Collection Time: 07/31/16  6:42 AM  Result Value Ref Range   Prolactin 3.6 (L) 4.8 - 23.3 ng/mL    Comment: (NOTE) Performed At: [facility] [address] [place], [place] [id number] Mila Homer MD QI:[id number]   TSH     Status: None   Collection Time: 07/31/16  6:42 AM  Result Value Ref Range   TSH 2.446 0.350 - 4.500 uIU/mL    Comment: Performed by a 3rd Generation assay with a functional sensitivity of <=0.01 uIU/mL.  Carbamazepine level, total     Status: None   Collection Time: 07/31/16  6:42 AM  Result Value Ref Range   Carbamazepine Lvl 5.3 4.0 - 12.0 ug/mL  Glucose, capillary     Status: Abnormal   Collection Time: 07/31/16 11:36 AM  Result Value Ref Range   Glucose-Capillary 312 (H) 65 - 99 mg/dL  Glucose, capillary     Status: Abnormal   Collection Time: 07/31/16  4:41 PM  Result Value Ref Range   Glucose-Capillary 250 (H) 65 - 99 mg/dL  Glucose, capillary     Status: Abnormal   Collection Time: 07/31/16  8:25 PM  Result Value Ref Range  Glucose-Capillary 290 (H) 65 - 99 mg/dL  Urine Drug Screen, Qualitative     Status: None   Collection Time: 08/01/16  6:27 AM  Result Value Ref Range   Tricyclic, Ur Screen NONE DETECTED NONE DETECTED   Amphetamines, Ur Screen NONE DETECTED NONE DETECTED   MDMA (Ecstasy)Ur Screen NONE DETECTED NONE DETECTED   Cocaine Metabolite,Ur [place] NONE DETECTED NONE DETECTED   Opiate, Ur Screen NONE DETECTED NONE DETECTED   Phencyclidine (PCP) Ur S NONE DETECTED NONE DETECTED   Cannabinoid 50 Ng, Ur [place] NONE DETECTED NONE DETECTED   Barbiturates, Ur Screen NONE DETECTED NONE DETECTED   Benzodiazepine, Ur Scrn NONE DETECTED NONE DETECTED   Methadone Scn, Ur NONE DETECTED NONE DETECTED    Comment:  (NOTE) 100  Tricyclics, urine               Cutoff 1000 ng/mL 200  Amphetamines, urine             Cutoff 1000 ng/mL 300  MDMA (Ecstasy), urine           Cutoff 500 ng/mL 400  Cocaine Metabolite, urine       Cutoff 300 ng/mL 500  Opiate, urine                   Cutoff 300 ng/mL 600  Phencyclidine (PCP), urine      Cutoff 25 ng/mL 700  Cannabinoid, urine              Cutoff 50 ng/mL 800  Barbiturates, urine             Cutoff 200 ng/mL 900  Benzodiazepine, urine           Cutoff 200 ng/mL 1000 Methadone, urine                Cutoff 300 ng/mL 1100 1200 The urine drug screen provides only a preliminary, unconfirmed 1300 analytical test result and should not be used for non-medical 1400 purposes. Clinical consideration and professional judgment should 1500 be applied to any positive drug screen result due to possible 1600 interfering substances. A more specific alternate chemical method 1700 must be used in order to obtain a confirmed analytical result.  1800 Gas chromato graphy / mass spectrometry (GC/MS) is the preferred 1900 confirmatory method.   Glucose, capillary     Status: Abnormal   Collection Time: 08/01/16  6:40 AM  Result Value Ref Range   Glucose-Capillary 227 (H) 65 - 99 mg/dL  Glucose, capillary     Status: Abnormal   Collection Time: 08/01/16 11:57 AM  Result Value Ref Range   Glucose-Capillary 266 (H) 65 - 99 mg/dL   Comment 1 Notify RN     Blood Alcohol level:  Lab Results  Component Value Date   ETH <5 07/29/2016   ETH 134 (H) 05/14/2016    Metabolic Disorder Labs: Lab Results  Component Value Date   HGBA1C 7.8 (H) 07/31/2016   MPG 177 07/31/2016   Lab Results  Component Value Date   PROLACTIN 3.6 (L) 07/31/2016   Lab Results  Component Value Date   CHOL 179 07/31/2016   TRIG 136 07/31/2016   HDL 51 07/31/2016   CHOLHDL 3.5 07/31/2016   VLDL 27 07/31/2016   LDLCALC 101 (H) 07/31/2016    Physical Findings: AIMS: Facial and Oral  Movements Muscles of Facial Expression: None, normal Lips and Perioral Area: None, normal Jaw: None, normal Tongue: None, normal,Extremity Movements Upper (arms, wrists, hands, fingers): None,  normal Lower (legs, knees, ankles, toes): None, normal, Trunk Movements Neck, shoulders, hips: None, normal, Overall Severity Severity of abnormal movements (highest score from questions above): None, normal Incapacitation due to abnormal movements: None, normal Patient's awareness of abnormal movements (rate only patient's report): No Awareness, Dental Status Current problems with teeth and/or dentures?: No Does patient usually wear dentures?: No  CIWA:  CIWA-Ar Total: 0 COWS:     Musculoskeletal: Strength & Muscle Tone: within normal limits Gait & Station: normal Patient leans: N/A  Psychiatric Specialty Exam: Physical Exam  Constitutional: She is oriented to person, place, and time. She appears well-developed and well-nourished.  HENT:  Head: Normocephalic.  Eyes: EOM are normal.  Musculoskeletal: Normal range of motion.  Neurological: She is alert and oriented to person, place, and time.    Review of Systems  Constitutional: Negative.   HENT: Negative.   Eyes: Negative.   Respiratory: Negative.   Cardiovascular: Negative.   Gastrointestinal: Negative.   Genitourinary: Negative.   Musculoskeletal: Negative.   Skin: Negative.   Neurological: Negative.   Endo/Heme/Allergies: Negative.   Psychiatric/Behavioral: Positive for depression.    Blood pressure 112/70, pulse (!) 101, temperature 97.8 F (36.6 C), temperature source Oral, resp. rate 18, height 5\' 2"  (1.575 m), weight 80.1 kg (176 lb 8 oz), SpO2 99 %.Body mass index is 32.28 kg/m.  General Appearance: Fairly Groomed  Eye Contact:  Good  Speech:  Clear and Coherent  Volume:  Normal  Mood:  Euthymic  Affect:  Flat  Thought Process:  Linear and Descriptions of Associations: Intact  Orientation:  Full (Time, Place, and  Person)  Thought Content:  Hallucinations: None Thought is bradyphrenic  Suicidal Thoughts:  No  Homicidal Thoughts:  No  Memory:  Immediate;   Fair Recent;   Fair Remote;   Fair  Judgement:  Poor  Insight:  Shallow  Psychomotor Activity:  Decreased  Concentration:  Concentration: Fair and Attention Span: Fair  Recall:  [employer] of Knowledge:  Fair  Language:  Good  Akathisia:  No  Handed:    AIMS (if indicated):     Assets:  Manufacturing systems engineer Physical Health Social Support  ADL's:  Intact  Cognition:  Impaired,  Mild  Sleep:  Number of Hours: 7.45     Treatment Plan Summary: Daily contact with patient to assess and evaluate symptoms and progress in treatment  Patient is a 61 year old Caucasian female with bipolar disorder who presented to our emergency department with a manic episode and psychotic features.  Triggers for this episode appeared to be noncompliance  Bipolar disorder type I patient will be continued on Tegretol  400 mg twice a day.  Tegretol level on 200 mg bid was 5.3  Continue Geodon 60 mg po bid with meals  Zonegran and Seroquel have been  discontinued  Akathisia: Patient reports prior history of akathisia with Geodon will continue Inderal 20 mg twice a day  Tardive dyskinesia: Patient has significant involuntary movements in her mouth  Insomnia I will order Ativan 1 mg by mouth daily at bedtime---slept well last night  Vitamin D deficiency continue vitamin D 400 units a day and Citra cal daily  Diabetes continue metformin but will increase to 1000 mg po bid.  Also added novolog 3 U tid. Patient is on a carb modified diet. I have place orders for supplemental insulin  Dyslipidemia continue Crestor 40 mg a day  Labs- hemoglobin A1c 7.8%, lipid panel slight elevation of LDL (101) and TSH wnl  Diet carb modified  Vital signs daily  Hospitalization status voluntary  Disposition she will be discharged back to her home once  stable  F/U continue to follow-up with Dr. Omelia Blackwater  Records were reviewed from prior hospitalization here in 2015. She was discharged with a diagnosis of bipolar disorder.  Jimmy Footman, MD 08/01/2016, 1:21 PM

## 2016-08-01 NOTE — Progress Notes (Signed)
Patient pleasant, seen in the dayroom coloring and watching TV. Attended groups, medication compliant and remained cooperative to unit routine. Patient stated that she usually experiences auditory hallucinations but today she said the voices had subsided. Frequent verbal contacts maintained to ensure patient was safe. Patient stated that her main concern was to get well so that she could return back home. Patient encouraged to verbalize thoughts and needs to nursing staff. She verbalized understanding.

## 2016-08-01 NOTE — Progress Notes (Addendum)
Pt denies SI/HI/AVH. Attended evening group. Visible in milieu interacting appropriately with peers and staff. Denies pain. Medication compliant and voices no concerns at this time. Encouragement and support provided. Safety maintained. Will continue to monitor

## 2016-08-01 NOTE — [facility] Group Notes (Signed)
[facility] Group Notes:  (Nursing/MHT/Case Management/Adjunct)  Date:  08/01/2016  Time:  2:39 AM  Type of Therapy:  Psychoeducational Skills  Participation Level:  Did Not Attend   Summary of Progress/Problems:  Rita MilroyLaquanda Y Sullivan Rita Sullivan 08/01/2016, 2:39 AM

## 2016-08-01 NOTE — [facility] Group Notes (Signed)
[facility] LCSW Group Therapy  08/01/2016 3:18 PM  Type of Therapy:  Group Therapy  Participation Level:  Minimal  Participation Quality:  Drowsy  Affect:  Lethargic  Cognitive:  Lacking  Insight:  Poor  Engagement in Therapy:  Lacking  Modes of Intervention:  Discussion, Education, Problem-solving, Reality Testing and Support  Summary of Progress/Problems: Goal Setting: The objective is to set goals as they relate to the crisis in which they were admitted. Patients will be using SMART goal modalities to set measurable goals. Characteristics of realistic goals will be discussed and patients will be assisted in setting and processing how one will reach their goal. Facilitator will also assist patients in applying interventions and coping skills learned in psycho-education groups to the SMART goal and process how one will achieve defined goal.  Khamryn Calderone G. Garnette CzechSampson MSW, LCSWA 08/01/2016, 3:22 PM

## 2016-08-02 DIAGNOSIS — F250 Schizoaffective disorder, bipolar type: Secondary | ICD-10-CM

## 2016-08-02 LAB — GLUCOSE, CAPILLARY
GLUCOSE-CAPILLARY: 210 mg/dL — AB (ref 65–99)
GLUCOSE-CAPILLARY: 193 mg/dL — AB (ref 65–99)
GLUCOSE-CAPILLARY: 293 mg/dL — AB (ref 65–99)
Glucose-Capillary: 227 mg/dL — ABNORMAL HIGH (ref 65–99)

## 2016-08-02 MED ORDER — ZIPRASIDONE HCL 40 MG PO CAPS
80.0000 mg | ORAL_CAPSULE | Freq: Two times a day (BID) | ORAL | Status: DC
  Administered 2016-08-02 – 2016-08-06 (×8): 80 mg via ORAL
  Filled 2016-08-02 (×8): qty 2

## 2016-08-02 MED ORDER — ONDANSETRON HCL 4 MG PO TABS: 4.0000 mg | ORAL_TABLET | ORAL | Status: DC | PRN

## 2016-08-02 MED ORDER — LORAZEPAM 2 MG PO TABS
2.0000 mg | ORAL_TABLET | Freq: Every day | ORAL | Status: DC
  Administered 2016-08-02 – 2016-08-05 (×4): 2 mg via ORAL
  Filled 2016-08-02 (×2): qty 1

## 2016-08-02 NOTE — Progress Notes (Signed)
[facility]  08/02/2016 12:18 PM Rita Sullivan  MRN:  [id number] Subjective: Patient is a 61 year old married [place] [facility] [place] [facility] [place]. Patient came to our emergency department voluntarily on March 7 in the company of her husband. The patient complained of feeling "manic". Her husband reported that she had run away from the home earlier in the day.  Patient complained of not being able to think clearly. She stated she had racing thoughts and was unable to concentrate. Some staff in the emergency Department described her as having thought blocking.  Patient carries a diagnosis of bipolar disorder versus a schizoaffective disorder bipolar type. She follows up with Dr.Headen at [facility]. Patient says that for the most part she is being compliant with her medications but thinks that over the last few  days she had missed a few doses.  Patient is currently prescribed with Tegretol, Zonegran, Seroquel and Geodon.  Patient was family limited historian today. She was pleasant and cooperative. She says that for the last couple weeks she has been having significant trouble sleeping. She is only resting about one hour a night. She says she stays up reading books. She also has been hearing voices of the nature planetary influences---gods talking to her trying to tell her to make changes in the world. She feels that her energy and her mood are elevated. She denies suicidality or homicidality and she also denies having visual hallucinations.  As far as substance abuse he denies having any history of substance use. She does not smoke cigarettes  Trauma history she denies  3/10 Patient states she feels okay today. She attended many groups yesterday reports they are helping, she is learning about preventing relapse. Slept well, reports meds are helping, appetite is normal/avg. Denies SI/HI/side effects to meds.  Answer are vague and short. No really any  change since  admission  3/11 Pt reports she did not sleep well last night, says she may have slept for 1/2 hour last night. She is still hearing some voices, energy level 4/10, reports healthy appetite and able to concentrate and stay on task. Denies side effects to meds or physical complaints. Wants to "inflate self esteem". Denies SI/HI or visual hallucinations.  Per nursing  Patient cooperative with treatment, she spent most of the evening laying in bed reading a book.She was medication compliant and remained cooperative to unit routine. Patient encouraged to verbalize thoughts and needs to nursing staff. She verbalized understanding.  She appears to be resting in bed quietly at this time.  Patient pleasant, seen in the dayroom coloring and watching TV. Attended groups, medication compliant and remained cooperative to unit routine. Patient stated that she usually experiences auditory hallucinations but today she said the voices had subsided. Frequent verbal contacts maintained to ensure patient was safe. Patient stated that her main concern was to get well so that she could return back home. Patient encouraged to verbalize thoughts and needs to nursing staff. She verbalized understanding.   Principal Problem: Schizoaffective disorder, bipolar type (HCC) Diagnosis:   Patient Active Problem List   Diagnosis Date Noted  . Schizoaffective disorder, bipolar type (HCC) [F25.0] 08/02/2016  . Dyslipidemia [E78.5] 07/31/2016  . Akathisia [G25.71] 07/31/2016  . Vitamin D deficiency [E55.9] 07/31/2016  . Diabetes mellitus type 2, diet-controlled (HCC) [E11.9] 04/11/2015   Total Time spent with patient: 30 minutes  Past Psychiatric History: Patient states she is being hospitalized a multitude of time since the age of 61 when she  was first diagnosed with bipolar disorder. She says that her last hospitalization was here in 2015. She denies any history of self injury or suicidal attempts  Past Medical History:  Past  Medical History:  Diagnosis Date  . Bipolar disorder (HCC)   . Chronic kidney disease   . Diabetes mellitus without complication (HCC)   . Hypercholesteremia   . Manic behavior (HCC)     Past Surgical History:  Procedure Laterality Date  . COLONOSCOPY WITH PROPOFOL N/A 03/11/2015   Procedure: COLONOSCOPY WITH PROPOFOL;  Surgeon: Wallace Cullens, MD;  Location[facility] ENDOSCOPY;  Service: Gastroenterology;  Laterality: N/A;   Family History:  Family History  Problem Relation Age of Onset  . Diabetes Father   . Diabetes Sister    Family Psychiatric  History: Patient denies having any family members with history of suicides, mental illness or substance abuse  Social History:  History  Alcohol Use  . 1.2 oz/week  . 2 Glasses of wine per week    Comment: Occ     History  Drug Use No    Social History   Social History  . Marital status: Married    Spouse name: N/A  . Number of children: N/A  . Years of education: N/A   Social History Main Topics  . Smoking status: Never Smoker  . Smokeless tobacco: Never Used  . Alcohol use 1.2 oz/week    2 Glasses of wine per week     Comment: Occ  . Drug use: No  . Sexual activity: Not Asked   Other Topics Concern  . None   Social History Narrative  . None   Additional Social History:      Sleep: Fair  Appetite:  Fair  Current Medications: Current Facility-Administered Medications  Medication Dose Route Frequency Provider Last Rate Last Dose  . acetaminophen (TYLENOL) tablet 650 mg  650 mg Oral Q6H PRN Audery Amel, MD      . alum & mag hydroxide-simeth (MAALOX/MYLANTA) 200-200-20 MG/5ML suspension 30 mL  30 mL Oral Q4H PRN Audery Amel, MD      . aspirin EC tablet 81 mg  81 mg Oral Daily Audery Amel, MD   81 mg at 08/02/16 0824  . calcium-vitamin D (OSCAL WITH D) 500-200 MG-UNIT per tablet 1 tablet  1 tablet Oral BID Audery Amel, MD   1 tablet at 08/02/16 0825  . carbamazepine (TEGRETOL) tablet 400 mg  400 mg Oral  BID Jimmy Footman, MD   400 mg at 08/02/16 0825  . cholecalciferol (VITAMIN D) tablet 400 Units  400 Units Oral Daily Audery Amel, MD   400 Units at 08/02/16 0824  . insulin aspart (novoLOG) injection 0-5 Units  0-5 Units Subcutaneous QHS Jimmy Footman, MD   2 Units at 08/01/16 2111  . insulin aspart (novoLOG) injection 0-9 Units  0-9 Units Subcutaneous TID WC Jimmy Footman, MD   3 Units at 08/02/16 1208  . insulin aspart (novoLOG) injection 3 Units  3 Units Subcutaneous TID WC Jimmy Footman, MD   3 Units at 08/02/16 1209  . LORazepam (ATIVAN) tablet 1 mg  1 mg Oral QHS PRN Jimmy Footman, MD      . LORazepam (ATIVAN) tablet 2 mg  2 mg Oral QHS Jimmy Footman, MD      . magnesium hydroxide (MILK OF MAGNESIA) suspension 30 mL  30 mL Oral Daily PRN Audery Amel, MD      . metFORMIN (GLUCOPHAGE) tablet  1,000 mg  1,000 mg Oral BID WC Jimmy Footman, MD   1,000 mg at 08/02/16 0825  . propranolol (INDERAL) tablet 20 mg  20 mg Oral BID Audery Amel, MD   20 mg at 08/02/16 0824  . rosuvastatin (CRESTOR) tablet 40 mg  40 mg Oral Daily Audery Amel, MD   40 mg at 08/02/16 0824  . ziprasidone (GEODON) capsule 80 mg  80 mg Oral BID WC Jimmy Footman, MD        Lab Results:  Results for orders placed or performed during the hospital encounter of 07/30/16 (from the past 48 hour(s))  Glucose, capillary     Status: Abnormal   Collection Time: 07/31/16  4:41 PM  Result Value Ref Range   Glucose-Capillary 250 (H) 65 - 99 mg/dL  Glucose, capillary     Status: Abnormal   Collection Time: 07/31/16  8:25 PM  Result Value Ref Range   Glucose-Capillary 290 (H) 65 - 99 mg/dL  Urine Drug Screen, Qualitative     Status: None   Collection Time: 08/01/16  6:27 AM  Result Value Ref Range   Tricyclic, Ur Screen NONE DETECTED NONE DETECTED   Amphetamines, Ur Screen NONE DETECTED NONE DETECTED   MDMA (Ecstasy)Ur Screen  NONE DETECTED NONE DETECTED   Cocaine Metabolite,Ur [place] NONE DETECTED NONE DETECTED   Opiate, Ur Screen NONE DETECTED NONE DETECTED   Phencyclidine (PCP) Ur S NONE DETECTED NONE DETECTED   Cannabinoid 50 Ng, Ur [place] NONE DETECTED NONE DETECTED   Barbiturates, Ur Screen NONE DETECTED NONE DETECTED   Benzodiazepine, Ur Scrn NONE DETECTED NONE DETECTED   Methadone Scn, Ur NONE DETECTED NONE DETECTED    Comment: (NOTE) 100  Tricyclics, urine               Cutoff 1000 ng/mL 200  Amphetamines, urine             Cutoff 1000 ng/mL 300  MDMA (Ecstasy), urine           Cutoff 500 ng/mL 400  Cocaine Metabolite, urine       Cutoff 300 ng/mL 500  Opiate, urine                   Cutoff 300 ng/mL 600  Phencyclidine (PCP), urine      Cutoff 25 ng/mL 700  Cannabinoid, urine              Cutoff 50 ng/mL 800  Barbiturates, urine             Cutoff 200 ng/mL 900  Benzodiazepine, urine           Cutoff 200 ng/mL 1000 Methadone, urine                Cutoff 300 ng/mL 1100 1200 The urine drug screen provides only a preliminary, unconfirmed 1300 analytical test result and should not be used for non-medical 1400 purposes. Clinical consideration and professional judgment should 1500 be applied to any positive drug screen result due to possible 1600 interfering substances. A more specific alternate chemical method 1700 must be used in order to obtain a confirmed analytical result.  1800 Gas chromato graphy / mass spectrometry (GC/MS) is the preferred 1900 confirmatory method.   Glucose, capillary     Status: Abnormal   Collection Time: 08/01/16  6:40 AM  Result Value Ref Range   Glucose-Capillary 227 (H) 65 - 99 mg/dL  Glucose, capillary     Status: Abnormal  Collection Time: 08/01/16 11:57 AM  Result Value Ref Range   Glucose-Capillary 266 (H) 65 - 99 mg/dL   Comment 1 Notify RN   Glucose, capillary     Status: Abnormal   Collection Time: 08/01/16  4:15 PM  Result Value Ref Range   Glucose-Capillary 267  (H) 65 - 99 mg/dL   Comment 1 Notify RN   Glucose, capillary     Status: Abnormal   Collection Time: 08/01/16  9:05 PM  Result Value Ref Range   Glucose-Capillary 230 (H) 65 - 99 mg/dL  Glucose, capillary     Status: Abnormal   Collection Time: 08/02/16  6:50 AM  Result Value Ref Range   Glucose-Capillary 227 (H) 65 - 99 mg/dL   Comment 1 Notify RN   Glucose, capillary     Status: Abnormal   Collection Time: 08/02/16 11:33 AM  Result Value Ref Range   Glucose-Capillary 210 (H) 65 - 99 mg/dL    Blood Alcohol level:  Lab Results  Component Value Date   ETH <5 07/29/2016   ETH 134 (H) 05/14/2016    Metabolic Disorder Labs: Lab Results  Component Value Date   HGBA1C 7.8 (H) 07/31/2016   MPG 177 07/31/2016   Lab Results  Component Value Date   PROLACTIN 3.6 (L) 07/31/2016   Lab Results  Component Value Date   CHOL 179 07/31/2016   TRIG 136 07/31/2016   HDL 51 07/31/2016   CHOLHDL 3.5 07/31/2016   VLDL 27 07/31/2016   LDLCALC 101 (H) 07/31/2016    Physical Findings: AIMS: Facial and Oral Movements Muscles of Facial Expression: None, normal Lips and Perioral Area: None, normal Jaw: None, normal Tongue: None, normal,Extremity Movements Upper (arms, wrists, hands, fingers): None, normal Lower (legs, knees, ankles, toes): None, normal, Trunk Movements Neck, shoulders, hips: None, normal, Overall Severity Severity of abnormal movements (highest score from questions above): None, normal Incapacitation due to abnormal movements: None, normal Patient's awareness of abnormal movements (rate only patient's report): No Awareness, Dental Status Current problems with teeth and/or dentures?: No Does patient usually wear dentures?: No  CIWA:  CIWA-Ar Total: 0 COWS:     Musculoskeletal: Strength & Muscle Tone: within normal limits Gait & Station: normal Patient leans: N/A  Psychiatric Specialty Exam: Physical Exam  Constitutional: She is oriented to person, place, and  time. She appears well-developed and well-nourished.  HENT:  Head: Normocephalic.  Eyes: EOM are normal.  Musculoskeletal: Normal range of motion.  Neurological: She is alert and oriented to person, place, and time.    Review of Systems  Constitutional: Negative.   HENT: Negative.   Eyes: Negative.   Respiratory: Negative.   Cardiovascular: Negative.   Gastrointestinal: Negative.   Genitourinary: Negative.   Musculoskeletal: Negative.   Skin: Negative.   Neurological: Negative.   Endo/Heme/Allergies: Negative.   Psychiatric/Behavioral: Positive for depression.    Blood pressure 115/75, pulse 87, temperature 98 F (36.7 C), temperature source Oral, resp. rate 20, height 5\' 2"  (1.575 m), weight 80.1 kg (176 lb 8 oz), SpO2 99 %.Body mass index is 32.28 kg/m.  General Appearance: Fairly Groomed  Eye Contact:  Good  Speech:  Clear and Coherent  Volume:  Normal  Mood:  Euthymic  Affect:  Flat  Thought Process:  Linear and Descriptions of Associations: Intact  Orientation:  Full (Time, Place, and Person)  Thought Content:  Hallucinations: None Thought is bradyphrenic  Suicidal Thoughts:  No  Homicidal Thoughts:  No  Memory:  Immediate;  Fair Recent;   Fair Remote;   Fair  Judgement:  Poor  Insight:  Shallow  Psychomotor Activity:  Decreased  Concentration:  Concentration: Fair and Attention Span: Fair  Recall:  [employer] of Knowledge:  Fair  Language:  Good  Akathisia:  No  Handed:    AIMS (if indicated):     Assets:  Manufacturing systems engineer Physical Health Social Support  ADL's:  Intact  Cognition:  Impaired,  Mild  Sleep:  Number of Hours: 5.5     Treatment Plan Summary: Daily contact with patient to assess and evaluate symptoms and progress in treatment   Minimal improvement.  Thought process still bradyphrenic. Has significant psychomotor retardation   Patient is a 61 year old Caucasian female with bipolar disorder who presented to our emergency department  with a manic episode and psychotic features.  Triggers for this episode appeared to be noncompliance  Bipolar disorder type I patient will be continued on Tegretol  400 mg twice a day.  Tegretol level on 200 mg bid was 5.3  Increased Geodon from 60 mg to 80 mg po bid with meals  Zonegran and Seroquel have been  discontinued  Akathisia: Patient reports prior history of akathisia with Geodon will continue Inderal 20 mg twice a day  Tardive dyskinesia: Patient has significant involuntary movements in her mouth  Insomnia I will increase Ativan from 1 mg to 2 mg by mouth daily at bedtime as pt did not sleep well last night  Vitamin D deficiency continue vitamin D 400 units a day and Citra cal daily  Diabetes continue metformin but will increase to 1000 mg po bid.  Also added novolog 3 U tid. Patient is on a carb modified diet. I have place orders for supplemental insulin  Dyslipidemia continue Crestor 40 mg a day  Labs- hemoglobin A1c 7.8%, lipid panel slight elevation of LDL (101) and TSH wnl  Diet carb modified  Vital signs daily  Hospitalization status voluntary  Disposition she will be discharged in the next 5 days.  F/U continue to follow-up with Dr. Omelia Blackwater  Records were reviewed from prior hospitalization here in 2015. She was discharged with a diagnosis of bipolar disorder.  Jimmy Footman, MD 08/02/2016, 12:18 PM

## 2016-08-02 NOTE — Plan of Care (Signed)
Problem: Safety: Goal: Ability to remain free from injury will improve Outcome: Progressing No Falls.

## 2016-08-02 NOTE — [facility] Group Notes (Signed)
[facility] Group Notes:  (Nursing/MHT/Case Management/Adjunct)  Date:  08/02/2016  Time:  3:48 AM  Type of Therapy:  Psychoeducational Skills  Participation Level:  Active  Participation Quality:  Appropriate, Attentive and Sharing  Affect:  Appropriate  Cognitive:  Appropriate  Insight:  Appropriate and Good  Engagement in Group:  Engaged  Modes of Intervention:  Discussion, Socialization and Support  Summary of Progress/Problems:  Chancy MilroyLaquanda Y Calix Heinbaugh 08/02/2016, 3:48 AM

## 2016-08-02 NOTE — [facility] Group Notes (Signed)
[facility] LCSW Group Therapy  08/02/2016 2:40 PM  Type of Therapy:  Group Therapy  Participation Level:  Minimal  Participation Quality:  Attentive  Affect:  Flat  Cognitive:  Lacking  Insight:  Limited  Engagement in Therapy:  Limited  Modes of Intervention:  Discussion, Education, Problem-solving, Reality Testing and Support  Summary of Progress/Problems: Communications: Patients identify how individuals communicate with one another appropriately and inappropriately. Patients will be guided to discuss their thoughts, feelings, and behaviors related to barriers when communicating. The group will process together ways to execute positive and appropriate communications.   Kendra Grissett G. Garnette CzechSampson MSW, LCSWA 08/02/2016, 2:43 PM

## 2016-08-02 NOTE — Progress Notes (Signed)
Patient visible in the dayroom did some coloring and watched TV. Patient stated her goal was to "Inflate my self esteem" and that she would achieve the goal by "coloring." She attended the 1pm group but left the class midway. When asked why she left group, she said, "the doctor has medication coming out of the vents in my room and I have to breathe it in." She rested in her bed the rest of the afternoon. She showered. She denied SI/HI and contracted for safety. Frequent verbal contact made with patient to ensure safety. Patient encouraged to verbalize thoughts to nursing staff. She verbalized understanding.

## 2016-08-02 NOTE — Progress Notes (Signed)
Patient cooperative with treatment, she spent most of the evening laying in bed reading a book.She was medication compliant and remained cooperative to unit routine. Patient encouraged to verbalize thoughts and needs to nursing staff. She verbalized understanding.  She appears to be resting in bed quietly at this time.

## 2016-08-02 NOTE — Progress Notes (Signed)
Patient vomited x1 which contained of the dinner she ate this evening. Patient states that after eating she started feeling nauseous and that the food did not agree with her. VS obtained(BP=124/84, HR=85, Temp. 98.4, O2 Sat. 99. MDOC Dr. Ardyth HarpsHernandez notified and new order of Zofran given.

## 2016-08-02 NOTE — Progress Notes (Signed)
D: Pt denies SI/HI/AVH. Pt is pleasant and cooperative. Pt spent most of the evening in her room.   A: Pt was offered support and encouragement. Pt was given scheduled medications. Pt was encourage to attend groups. Q 15 minute checks were done for safety.   R:Pt receptive to treatment and safety maintained on unit.

## 2016-08-03 LAB — GLUCOSE, CAPILLARY
GLUCOSE-CAPILLARY: 211 mg/dL — AB (ref 65–99)
GLUCOSE-CAPILLARY: 220 mg/dL — AB (ref 65–99)
GLUCOSE-CAPILLARY: 163 mg/dL — AB (ref 65–99)
GLUCOSE-CAPILLARY: 212 mg/dL — AB (ref 65–99)

## 2016-08-03 MED ORDER — INSULIN ASPART 100 UNIT/ML ~~LOC~~ SOLN
6.0000 [IU] | Freq: Three times a day (TID) | SUBCUTANEOUS | Status: DC
  Administered 2016-08-03 – 2016-08-06 (×9): 6 [IU] via SUBCUTANEOUS
  Filled 2016-08-03 (×4): qty 6
  Filled 2016-08-03: qty 1

## 2016-08-03 NOTE — [facility] Group Notes (Signed)
[facility] LCSW Group Therapy Note  Date/Time: 08/03/16, 1300  Type of Therapy and Topic:  Group Therapy:  Overcoming Obstacles  Participation Level:  Pt did not attend group.  Description of Group:    In this group patients will be encouraged to explore what they see as obstacles to their own wellness and recovery. They will be guided to discuss their thoughts, feelings, and behaviors related to these obstacles. The group will process together ways to cope with barriers, with attention given to specific choices patients can make. Each patient will be challenged to identify changes they are motivated to make in order to overcome their obstacles. This group will be process-oriented, with patients participating in exploration of their own experiences as well as giving and receiving support and challenge from other group members.  Therapeutic Goals: 1. Patient will identify personal and current obstacles as they relate to admission. 2. Patient will identify barriers that currently interfere with their wellness or overcoming obstacles.  3. Patient will identify feelings, thought process and behaviors related to these barriers. 4. Patient will identify two changes they are willing to make to overcome these obstacles:    Summary of Patient Progress      Therapeutic Modalities:   Cognitive Behavioral Therapy Solution Focused Therapy Motivational Interviewing Relapse Prevention Therapy  Greg Olanda Boughner, LCSW 

## 2016-08-03 NOTE — Progress Notes (Signed)
Recreation Therapy Notes  Date: 03.12.18 Time: 9:30 am Location: Craft Room   Group Topic: Self-expression   Goal Area(s) Addresses:  Patient will identify one color per emotion listed on wheel. Patient will verbalize benefit of using art as a means of self-expression Patient will verbalize one emotions experienced during session. Patient will be educated on other forms of self-expression.   Behavioral Response: Did not attend   Intervention: Emotion Wheel   Activity: Patients were given an emotion wheel worksheet and were instructed to pick a color for each emotion listed on the wheel.   Education: LRT educated patients other forms of self-expression.   Education Outcome: Patient did not attend group.   Clinical Observations/Feedback: Patient did not attend group.  Jacquelynn CreeGreene,Atiyah Bauer M, LRT/CTRS 08/03/2016 10:32 AM

## 2016-08-03 NOTE — [facility] Group Notes (Signed)
[facility] Group Notes:  (Nursing/MHT/Case Management/Adjunct)  Date:  08/03/2016  Time:  4:23 PM  Type of Therapy:  Psychoeducational Skills  Participation Level:  Minimal  Participation Quality:  Appropriate, Attentive and Supportive  Affect:  Appropriate  Cognitive:  Appropriate  Insight:  Appropriate  Engagement in Group:  Engaged  Modes of Intervention:  Discussion and Education  Summary of Progress/Problems:  Rita Sullivan 08/03/2016, 4:23 PM

## 2016-08-03 NOTE — Plan of Care (Signed)
Problem: Safety: Goal: Periods of time without injury will increase Outcome: Progressing Pt safe on the unit at this time   

## 2016-08-03 NOTE — Tx Team (Signed)
Interdisciplinary Treatment and Diagnostic Plan Update  08/03/2016 Time of Session: 10:30am [name] MRN: [id number]  Principal Diagnosis: Schizoaffective disorder, bipolar type [facility])  Secondary Diagnoses: Principal Problem:   Schizoaffective disorder, bipolar type (HCC) Active Problems:   Diabetes mellitus type 2, diet-controlled (HCC)   Dyslipidemia   Akathisia   Vitamin D deficiency   Current Medications:  Current Facility-Administered Medications  Medication Dose Route Frequency Provider Last Rate Last Dose  . acetaminophen (TYLENOL) tablet 650 mg  650 mg Oral Q6H PRN Audery Amel, MD      . alum & mag hydroxide-simeth (MAALOX/MYLANTA) 200-200-20 MG/5ML suspension 30 mL  30 mL Oral Q4H PRN Audery Amel, MD      . aspirin EC tablet 81 mg  81 mg Oral Daily Audery Amel, MD   81 mg at 08/03/16 0828  . calcium-vitamin D (OSCAL WITH D) 500-200 MG-UNIT per tablet 1 tablet  1 tablet Oral BID Audery Amel, MD   1 tablet at 08/03/16 [phone]  . carbamazepine (TEGRETOL) tablet 400 mg  400 mg Oral BID Jimmy Footman, MD   400 mg at 08/03/16 6962  . cholecalciferol (VITAMIN D) tablet 400 Units  400 Units Oral Daily Audery Amel, MD   400 Units at 08/03/16 [phone]  . insulin aspart (novoLOG) injection 0-5 Units  0-5 Units Subcutaneous QHS Jimmy Footman, MD   3 Units at 08/02/16 2139  . insulin aspart (novoLOG) injection 0-9 Units  0-9 Units Subcutaneous TID WC Jimmy Footman, MD   3 Units at 08/03/16 1148  . insulin aspart (novoLOG) injection 6 Units  6 Units Subcutaneous TID WC Jimmy Footman, MD   6 Units at 08/03/16 1149  . LORazepam (ATIVAN) tablet 1 mg  1 mg Oral QHS PRN Jimmy Footman, MD      . LORazepam (ATIVAN) tablet 2 mg  2 mg Oral QHS Jimmy Footman, MD   2 mg at 08/02/16 2141  . magnesium hydroxide (MILK OF MAGNESIA) suspension 30 mL  30 mL Oral Daily PRN Audery Amel, MD      . metFORMIN (GLUCOPHAGE)  tablet 1,000 mg  1,000 mg Oral BID WC Jimmy Footman, MD   1,000 mg at 08/03/16 4132  . ondansetron (ZOFRAN) tablet 4 mg  4 mg Oral Q4H PRN Jimmy Footman, MD      . propranolol (INDERAL) tablet 20 mg  20 mg Oral BID Audery Amel, MD   20 mg at 08/03/16 0828  . rosuvastatin (CRESTOR) tablet 40 mg  40 mg Oral Daily Audery Amel, MD   40 mg at 08/03/16 0828  . ziprasidone (GEODON) capsule 80 mg  80 mg Oral BID WC Jimmy Footman, MD   80 mg at 08/03/16 4401   PTA Medications: Prescriptions Prior to Admission  Medication Sig Dispense Refill Last Dose  . aspirin EC 81 MG tablet Take 81 mg by mouth daily.   Taking  . calcium citrate-vitamin D (CITRACAL+D) 315-200 MG-UNIT tablet Take 1 tablet by mouth 2 (two) times daily.   Taking  . carbamazepine (TEGRETOL) 200 MG tablet Take 200 mg by mouth 2 (two) times daily.    Taking  . cholecalciferol (VITAMIN D) 400 UNITS TABS tablet Take 400 Units by mouth daily.   Taking  . folic acid (FOLVITE) 400 MCG tablet Take 400 mcg by mouth daily.     . metFORMIN (GLUCOPHAGE) 500 MG tablet Take 1 tablet (500 mg total) by mouth 2 (two) times daily with a meal.  60 tablet 11   . propranolol (INDERAL) 20 MG tablet Take 20 mg by mouth 2 (two) times daily.    Taking  . QUEtiapine (SEROQUEL) 300 MG tablet Take 300 mg by mouth at bedtime.    Taking  . rosuvastatin (CRESTOR) 40 MG tablet Take 40 mg by mouth daily.   Taking  . vitamin C (ASCORBIC ACID) 500 MG tablet Take 500 mg by mouth daily.   Taking  . ziprasidone (GEODON) 20 MG capsule Take 20 mg by mouth 3 (three) times daily.     Marland Kitchen zonisamide (ZONEGRAN) 100 MG capsule Take 100 mg by mouth 2 (two) times daily.   Taking    Sullivan Stressors:    Sullivan Strengths: Motivation for treatment/growth Supportive family/friends  Treatment Modalities: Medication Management, Group therapy, Case management,  1 to 1 session with clinician, Psychoeducation, Recreational therapy.   Physician  Treatment Plan for Primary Diagnosis: Schizoaffective disorder, bipolar type (HCC) Long Term Goal(s): Improvement in symptoms so as ready for discharge Improvement in symptoms so as ready for discharge   Short Term Goals: Ability to identify and develop effective coping behaviors will improve Ability to identify triggers associated with substance abuse/mental health issues will improve Ability to identify changes in lifestyle to reduce recurrence of condition will improve Ability to verbalize feelings will improve  Medication Management: Evaluate Sullivan's response, side effects, and tolerance of medication regimen.  Therapeutic Interventions: 1 to 1 sessions, Unit Group sessions and Medication administration.  Evaluation of Outcomes: Progressing  Physician Treatment Plan for Secondary Diagnosis: Principal Problem:   Schizoaffective disorder, bipolar type (HCC) Active Problems:   Diabetes mellitus type 2, diet-controlled (HCC)   Dyslipidemia   Akathisia   Vitamin D deficiency  Long Term Goal(s): Improvement in symptoms so as ready for discharge Improvement in symptoms so as ready for discharge   Short Term Goals: Ability to identify and develop effective coping behaviors will improve Ability to identify triggers associated with substance abuse/mental health issues will improve Ability to identify changes in lifestyle to reduce recurrence of condition will improve Ability to verbalize feelings will improve     Medication Management: Evaluate Sullivan's response, side effects, and tolerance of medication regimen.  Therapeutic Interventions: 1 to 1 sessions, Unit Group sessions and Medication administration.  Evaluation of Outcomes: Progressing   RN Treatment Plan for Primary Diagnosis: Schizoaffective disorder, bipolar type (HCC) Long Term Goal(s): Knowledge of disease and therapeutic regimen to maintain health will improve  Short Term Goals: Ability to remain free from injury  will improve, Ability to demonstrate self-control, Ability to participate in decision making will improve, Ability to verbalize feelings will improve, Ability to identify and develop effective coping behaviors will improve and Compliance with prescribed medications will improve  Medication Management: RN will administer medications as ordered by provider, will assess and evaluate Sullivan's response and provide education to Sullivan for prescribed medication. RN will report any adverse and/or side effects to prescribing provider.  Therapeutic Interventions: 1 on 1 counseling sessions, Psychoeducation, Medication administration, Evaluate responses to treatment, Monitor vital signs and CBGs as ordered, Perform/monitor CIWA, COWS, AIMS and Fall Risk screenings as ordered, Perform wound care treatments as ordered.  Evaluation of Outcomes: Progressing   LCSW Treatment Plan for Primary Diagnosis: Schizoaffective disorder, bipolar type (HCC) Long Term Goal(s): Safe transition to appropriate next level of care at discharge, Engage Sullivan in therapeutic group addressing interpersonal concerns.  Short Term Goals: Engage Sullivan in aftercare planning with referrals and resources, Increase ability to appropriately  verbalize feelings, Increase emotional regulation, Facilitate acceptance of mental health diagnosis and concerns, Identify triggers associated with mental health/substance abuse issues and Increase skills for wellness and recovery  Therapeutic Interventions: Assess for all discharge needs, 1 to 1 time with Social worker, Explore available resources and support systems, Assess for adequacy in community support network, Educate family and significant other(s) on suicide prevention, Complete Psychosocial Assessment, Interpersonal group therapy.  Evaluation of Outcomes: Progressing    Recreational Therapy Treatment Plan for Primary Diagnosis: Schizoaffective disorder, bipolar type (HCC) Long Term  Goal(s): Sullivan will participate in recreation therapy treatment in at least 2 group sessions without prompting from LRT  Short Term Goals: Increase time management skills, Increase stress management skills  Treatment Modalities: Group Therapy and Individual Treatment Sessions  Therapeutic Interventions: Psychoeducation  Evaluation of Outcomes: Progressing   Progress in Treatment: Attending groups: Yes. Participating in groups: Yes. Taking medication as prescribed: Yes. Toleration medication: Yes. Family/Significant other contact made: Yes, individual(s) contacted:    Sullivan understands diagnosis: Yes. Discussing Sullivan identified problems/goals with staff: Yes. Medical problems stabilized or resolved: Yes. Denies suicidal/homicidal ideation: Yes. Issues/concerns per Sullivan self-inventory: Yes. Other:    New problem(s) identified: Yes, Describe:     New Short Term/Long Term Goal(s):  Discharge Plan or Barriers: [facility]  Reason for Continuation of Hospitalization: Delusions  Medication stabilization Other; describe Mood stabilization  Estimated Length of Stay: UP to 5 days  Attendees: Sullivan:[facility] 08/03/2016 3:55 PM  Physician: Ardyth Harpshernandez 08/03/2016 3:55 PM  Nursing: Shelia MediaJanet Jones, RN 08/03/2016 3:55 PM  RN Care Manager: 08/03/2016 3:55 PM  Social Worker: Jake SharkSara Laws, LCSW 08/03/2016 3:55 PM  Recreational Therapist: Princella IonElizabeth Nymir Ringler, LRT/CTRS 08/03/2016 3:55 PM  Other:  08/03/2016 3:55 PM  Other:  08/03/2016 3:55 PM  Other: 08/03/2016 3:55 PM    Scribe for Treatment Team: Glennon MacSara P Laws, LCSW 08/03/2016 3:55 PM

## 2016-08-03 NOTE — [facility] Group Notes (Signed)
[facility] Group Notes:  (Nursing/MHT/Case Management/Adjunct)  Date:  08/03/2016  Time:  3:28 AM  Type of Therapy:  Group Therapy  Participation Level:  Did Not Attend    Kimra Kantor Imani Yaritsa Savarino 08/03/2016, 3:28 AM 

## 2016-08-03 NOTE — Progress Notes (Signed)
[facility] MD Progress Note  08/03/2016 11:00 AM Rita Sullivan  MRN:  [id number] Subjective: Patient is a 61 year old married Caucasian female from [facility] [place]. Patient came to our emergency department voluntarily on March 7 in the company of her husband. The patient complained of feeling "manic". Her husband reported that she had run away from the home earlier in the day.  Patient complained of not being able to think clearly. She stated she had racing thoughts and was unable to concentrate. Some staff in the emergency Department described her as having thought blocking.  Patient carries a diagnosis of bipolar disorder versus a schizoaffective disorder bipolar type. She follows up [facility]. Patient says that for the most part she is being compliant with her medications but thinks that over the last few  days she had missed a few doses.  Patient is currently prescribed with Tegretol, Zonegran, Seroquel and Geodon.  Patient was family limited historian today. She was pleasant and cooperative. She says that for the last couple weeks she has been having significant trouble sleeping. She is only resting about one hour a night. She says she stays up reading books. She also has been hearing voices of the nature planetary influences---gods talking to her trying to tell her to make changes in the world. She feels that her energy and her mood are elevated. She denies suicidality or homicidality and she also denies having visual hallucinations.  As far as substance abuse he denies having any history of substance use. She does not smoke cigarettes  Trauma history she denies  3/10 Patient states she feels okay today. She attended many groups yesterday reports they are helping, she is learning about preventing relapse. Slept well, reports meds are helping, appetite is normal/avg. Denies SI/HI/side effects to meds.  Answer are vague and short. No really any  change since  admission  3/11 Pt reports she did not sleep well last night, says she may have slept for 1/2 hour last night. She is still hearing some voices, energy level 4/10, reports healthy appetite and able to concentrate and stay on task. Denies side effects to meds or physical complaints. Wants to "inflate self esteem". Denies SI/HI or visual hallucinations.  3/12 3/12 Patient has no new complaints today. She had a few episodes of nausea and vomiting last night, says shes not having any problems today. Appears to be doing well, wants to rest some more before going home. Reports no SI/HI or auditory/visual hallucinations today.  Thought proces still bradyphrenic.  Answer are vague and short.  Per nursing  Patient vomited x1 which contained of the dinner she ate this evening. Patient states that after eating she started feeling nauseous and that the food did not agree with her. VS obtained(BP=124/84, HR=85, Temp. 98.4, O2 Sat. 99. MDOC Dr. Ardyth Harps notified and new order of Zofran given  Patient visible in the dayroom did some coloring and watched TV. Patient stated her goal was to "Inflate my self esteem" and that she would achieve the goal by "coloring." She attended the 1pm group but left the class midway. When asked why she left group, she said, "the doctor has medication coming out of the vents in my room and I have to breathe it in." She rested in her bed the rest of the afternoon. She showered. She denied SI/HI and contracted for safety. Frequent verbal contact made with patient to ensure safety. Patient encouraged to verbalize thoughts to nursing staff. She verbalized understanding.  Principal Problem: Schizoaffective disorder, bipolar type (HCC) Diagnosis:   Patient Active Problem List   Diagnosis Date Noted  . Schizoaffective disorder, bipolar type (HCC) [F25.0] 08/02/2016  . Dyslipidemia [E78.5] 07/31/2016  . Akathisia [G25.71] 07/31/2016  . Vitamin D deficiency [E55.9] 07/31/2016  .  Diabetes mellitus type 2, diet-controlled (HCC) [E11.9] 04/11/2015   Total Time spent with patient: 30 minutes  Past Psychiatric History: Patient states she is being hospitalized a multitude of time since the age of 61 when she was first diagnosed with bipolar disorder. She says that her last hospitalization was here in 2015. She denies any history of self injury or suicidal attempts  Past Medical History:  Past Medical History:  Diagnosis Date  . Bipolar disorder (HCC)   . Chronic kidney disease   . Diabetes mellitus without complication (HCC)   . Hypercholesteremia   . Manic behavior (HCC)     Past Surgical History:  Procedure Laterality Date  . COLONOSCOPY WITH PROPOFOL N/A 03/11/2015   Procedure: COLONOSCOPY WITH PROPOFOL;  Surgeon: Wallace CullensPaul Y Oh, MD;  [facility];  Service: Gastroenterology;  Laterality: N/A;   Family History:  Family History  Problem Relation Age of Onset  . Diabetes Father   . Diabetes Sister    Family Psychiatric  History: Patient denies having any family members with history of suicides, mental illness or substance abuse  Social History:  History  Alcohol Use  . 1.2 oz/week  . 2 Glasses of wine per week    Comment: Occ     History  Drug Use No    Social History   Social History  . Marital status: Married    Spouse name: N/A  . Number of children: N/A  . Years of education: N/A   Social History Main Topics  . Smoking status: Never Smoker  . Smokeless tobacco: Never Used  . Alcohol use 1.2 oz/week    2 Glasses of wine per week     Comment: Occ  . Drug use: No  . Sexual activity: Not Asked   Other Topics Concern  . None   Social History Narrative  . None   Additional Social History:      Sleep: Fair  Appetite:  Fair  Current Medications: Current Facility-Administered Medications  Medication Dose Route Frequency Provider Last Rate Last Dose  . acetaminophen (TYLENOL) tablet 650 mg  650 mg Oral Q6H PRN Audery AmelJohn T  Clapacs, MD      . alum & mag hydroxide-simeth (MAALOX/MYLANTA) 200-200-20 MG/5ML suspension 30 mL  30 mL Oral Q4H PRN Audery AmelJohn T Clapacs, MD      . aspirin EC tablet 81 mg  81 mg Oral Daily Audery AmelJohn T Clapacs, MD   81 mg at 08/03/16 0828  . calcium-vitamin D (OSCAL WITH D) 500-200 MG-UNIT per tablet 1 tablet  1 tablet Oral BID Audery AmelJohn T Clapacs, MD   1 tablet at 08/03/16 [phone]  . carbamazepine (TEGRETOL) tablet 400 mg  400 mg Oral BID Jimmy FootmanAndrea Hernandez-Gonzalez, MD   400 mg at 08/03/16 [id number]  . cholecalciferol (VITAMIN D) tablet 400 Units  400 Units Oral Daily Audery AmelJohn T Clapacs, MD   400 Units at 08/03/16 [phone]  . insulin aspart (novoLOG) injection 0-5 Units  0-5 Units Subcutaneous QHS Jimmy FootmanAndrea Hernandez-Gonzalez, MD   3 Units at 08/02/16 2139  . insulin aspart (novoLOG) injection 0-9 Units  0-9 Units Subcutaneous TID WC Jimmy FootmanAndrea Hernandez-Gonzalez, MD   3 Units at 08/03/16 [phone]  . insulin aspart (novoLOG) injection 6  Units  6 Units Subcutaneous TID WC Jimmy Footman, MD      . LORazepam (ATIVAN) tablet 1 mg  1 mg Oral QHS PRN Jimmy Footman, MD      . LORazepam (ATIVAN) tablet 2 mg  2 mg Oral QHS Jimmy Footman, MD   2 mg at 08/02/16 2141  . magnesium hydroxide (MILK OF MAGNESIA) suspension 30 mL  30 mL Oral Daily PRN Audery Amel, MD      . metFORMIN (GLUCOPHAGE) tablet 1,000 mg  1,000 mg Oral BID WC Jimmy Footman, MD   1,000 mg at 08/03/16 9604  . ondansetron (ZOFRAN) tablet 4 mg  4 mg Oral Q4H PRN Jimmy Footman, MD      . propranolol (INDERAL) tablet 20 mg  20 mg Oral BID Audery Amel, MD   20 mg at 08/03/16 0828  . rosuvastatin (CRESTOR) tablet 40 mg  40 mg Oral Daily Audery Amel, MD   40 mg at 08/03/16 0828  . ziprasidone (GEODON) capsule 80 mg  80 mg Oral BID WC Jimmy Footman, MD   80 mg at 08/03/16 5409    Lab Results:  Results for orders placed or performed during the hospital encounter of 07/30/16 (from the past 48 hour(s))   Glucose, capillary     Status: Abnormal   Collection Time: 08/01/16 11:57 AM  Result Value Ref Range   Glucose-Capillary 266 (H) 65 - 99 mg/dL   Comment 1 Notify RN   Glucose, capillary     Status: Abnormal   Collection Time: 08/01/16  4:15 PM  Result Value Ref Range   Glucose-Capillary 267 (H) 65 - 99 mg/dL   Comment 1 Notify RN   Glucose, capillary     Status: Abnormal   Collection Time: 08/01/16  9:05 PM  Result Value Ref Range   Glucose-Capillary 230 (H) 65 - 99 mg/dL  Glucose, capillary     Status: Abnormal   Collection Time: 08/02/16  6:50 AM  Result Value Ref Range   Glucose-Capillary 227 (H) 65 - 99 mg/dL   Comment 1 Notify RN   Glucose, capillary     Status: Abnormal   Collection Time: 08/02/16 11:33 AM  Result Value Ref Range   Glucose-Capillary 210 (H) 65 - 99 mg/dL  Glucose, capillary     Status: Abnormal   Collection Time: 08/02/16  4:35 PM  Result Value Ref Range   Glucose-Capillary 193 (H) 65 - 99 mg/dL  Glucose, capillary     Status: Abnormal   Collection Time: 08/02/16  9:17 PM  Result Value Ref Range   Glucose-Capillary 293 (H) 65 - 99 mg/dL  Glucose, capillary     Status: Abnormal   Collection Time: 08/03/16  7:15 AM  Result Value Ref Range   Glucose-Capillary 211 (H) 65 - 99 mg/dL    Blood Alcohol level:  Lab Results  Component Value Date   ETH <5 07/29/2016   ETH 134 (H) 05/14/2016    Metabolic Disorder Labs: Lab Results  Component Value Date   HGBA1C 7.8 (H) 07/31/2016   MPG 177 07/31/2016   Lab Results  Component Value Date   PROLACTIN 3.6 (L) 07/31/2016   Lab Results  Component Value Date   CHOL 179 07/31/2016   TRIG 136 07/31/2016   HDL 51 07/31/2016   CHOLHDL 3.5 07/31/2016   VLDL 27 07/31/2016   LDLCALC 101 (H) 07/31/2016    Physical Findings: AIMS: Facial and Oral Movements Muscles of Facial Expression: None, normal Lips  and Perioral Area: None, normal Jaw: None, normal Tongue: None, normal,Extremity Movements Upper  (arms, wrists, hands, fingers): None, normal Lower (legs, knees, ankles, toes): None, normal, Trunk Movements Neck, shoulders, hips: None, normal, Overall Severity Severity of abnormal movements (highest score from questions above): None, normal Incapacitation due to abnormal movements: None, normal Patient's awareness of abnormal movements (rate only patient's report): No Awareness, Dental Status Current problems with teeth and/or dentures?: No Does patient usually wear dentures?: No  CIWA:  CIWA-Ar Total: 0 COWS:     Musculoskeletal: Strength & Muscle Tone: within normal limits Gait & Station: normal Patient leans: N/A  Psychiatric Specialty Exam: Physical Exam  Constitutional: She is oriented to person, place, and time. She appears well-developed and well-nourished.  HENT:  Head: Normocephalic.  Eyes: EOM are normal.  Musculoskeletal: Normal range of motion.  Neurological: She is alert and oriented to person, place, and time.    Review of Systems  Constitutional: Negative.   HENT: Negative.   Eyes: Negative.   Respiratory: Negative.   Cardiovascular: Negative.   Gastrointestinal: Positive for nausea and vomiting.  Genitourinary: Negative.   Musculoskeletal: Negative.   Skin: Negative.   Neurological: Negative.   Endo/Heme/Allergies: Negative.   Psychiatric/Behavioral: Positive for depression.    Blood pressure 108/84, pulse 97, temperature 98.3 F (36.8 C), resp. rate 18, height 5\' 2"  (1.575 m), weight 80.1 kg (176 lb 8 oz), SpO2 99 %.Body mass index is 32.28 kg/m.  General Appearance: Fairly Groomed  Eye Contact:  Good  Speech:  Clear and Coherent  Volume:  Normal  Mood:  Euthymic  Affect:  Appropriate  Thought Process:  Linear and Descriptions of Associations: Intact  Orientation:  Full (Time, Place, and Person)  Thought Content:  Hallucinations: None Thought is bradyphrenic  Suicidal Thoughts:  No  Homicidal Thoughts:  No  Memory:  Immediate;   Fair Recent;    Fair Remote;   Fair  Judgement:  Poor  Insight:  Fair  Psychomotor Activity:  Decreased  Concentration:  Concentration: Fair and Attention Span: Fair  Recall:  [employer] of Knowledge:  Fair  Language:  Good  Akathisia:  No  Handed:    AIMS (if indicated):     Assets:  Manufacturing systems engineer Physical Health Social Support  ADL's:  Intact  Cognition:  Impaired,  Mild  Sleep:  Number of Hours: 6.45     Treatment Plan Summary: Daily contact with patient to assess and evaluate symptoms and progress in treatment   Some improvement.  Thought process still bradyphrenic. Has significant psychomotor retardation, improved some  Patient is a 61 year old Caucasian female with bipolar disorder who presented to our emergency department with a manic episode and psychotic features.  Triggers for this episode appeared to be noncompliance  Bipolar disorder type I patient will be continued on Tegretol  400 mg twice a day.  Tegretol level on 200 mg bid was 5.3  Increased Geodon  to 80 mg po bid with meals  Zonegran and Seroquel have been  discontinued  Akathisia: Patient reports prior history of akathisia with Geodon will continue Inderal 20 mg twice a day  Tardive dyskinesia: Patient has significant involuntary movements in her mouth  Insomnia: continue ativan 2 mg po qhs  Vitamin D deficiency continue vitamin D 400 units a day and Citra cal daily  Diabetes continue metformin but will increase to 1000 mg po bid.  Also added novolog 6 U tid. Patient is on a carb modified diet. I have place  orders for supplemental insulin  Dyslipidemia continue Crestor 40 mg a day  Nausea- Zofran 4 mg q4 hrs prn  Labs- hemoglobin A1c 7.8%, lipid panel slight elevation of LDL (101) and TSH wnl  Diet carb modified  Vital signs daily  Hospitalization status voluntary  Disposition she will be discharged in the next 5 days.  F/U continue to follow-up with Dr. Omelia Blackwater  Records were  reviewed from prior hospitalization here in 2015. She was discharged with a diagnosis of bipolar disorder.  Jimmy Footman, MD 08/03/2016, 11:00 AM

## 2016-08-03 NOTE — [facility] Group Notes (Signed)
[facility] Group Notes:  (Nursing/MHT/Case Management/Adjunct)  Date:  08/03/2016  Time:  11:48 PM  Type of Therapy:    Participation Level:  Active  Participation Quality:  Appropriate  Affect:  Appropriate  Cognitive:  Appropriate  Insight:  Good  Engagement in Group:  Limited  Modes of Intervention:  Activity  Summary of Progress/Problems:  Rita Sullivan 08/03/2016, 11:48 PM

## 2016-08-03 NOTE — Plan of Care (Signed)
Problem: [facility] Participation in Recreation Therapeutic Interventions Goal: STG-Other Recreation Therapy Goal (Specify) STG: Time Management - Within 4 treatment sessions, patient will verbalize understanding of scheduling in each of 2 treatment sessions to increase time management skills.  Outcome: Progressing Treatment Session 1; Completed 1 out of 2: At approximately 11:25 am, LRT met with patient in consultation room. LRT educated and provided patient with blank schedules. Patient verbalized understanding of scheduling. LRT encouraged patient to use the schedules to help her manage her time better.  Leonette Monarch, LRT/CTRS 03.12.18 3:55 pm  Problem: [facility] Participation in Recreation Therapeutic Interventions Goal: STG-Other Recreation Therapy Goal (Specify) STG: Stress Management - Within 4 treatment sessions, patient will verbalize understanding of the stress management techniques in each of 2 treatment sessions to increase stress management skills.  Outcome: Progressing Treatment Session 1; Completed 1 out of 2: At approximately 11:25 am, LRT met with patient in consultation room. LRT educated and provided patient with handouts on stress management techniques. Patient verbalized understanding. LRT encouraged patient to read over and practice the stress management techniques.  Leonette Monarch, LRT/CTRS 03.12.18 3:58 pm

## 2016-08-03 NOTE — Progress Notes (Signed)
Inpatient Diabetes Program Recommendations  AACE/ADA: New Consensus Statement on Inpatient Glycemic Control (2015)  Target Ranges:  Prepandial:   less than 140 mg/dL      Peak postprandial:   less than 180 mg/dL (1-2 hours)      Critically ill patients:  140 - 180 mg/dL   Results for Noel ChristmasWATSON, Tuesday K (MRN [id number]) as of 08/03/2016 08:11  Ref. Range 08/02/2016 06:50 08/02/2016 11:33 08/02/2016 16:35 08/02/2016 21:17 08/03/2016 07:15  Glucose-Capillary Latest Ref Range: 65 - 99 mg/dL 086227 (H) 578210 (H) 469193 (H) 293 (H) 211 (H)   Review of Glycemic Control  Diabetes history: DM2 Outpatient Diabetes medications: Metformin 500 mg BID Current orders for Inpatient glycemic control: Metformin 1000 mg BID, Novolog 0-9 units TID with meals, Novolog 0-5 units QHS, Novolog 3 units TID with meals  Inpatient Diabetes Program Recommendations: Insulin - Meal Coverage: Please consider increasing meal coverage to Novolog 6 units TID with meals. A1C: A1C 7.8% on 07/31/16 indicating an average glucose of 177 mg/dl over the past 2-3 months.   Thanks, Orlando PennerMarie Iyonna Rish, RN, MSN, CDE Diabetes Coordinator Inpatient Diabetes Program [phone] (Team Pager from 8am to 5pm)

## 2016-08-03 NOTE — Progress Notes (Signed)
Patient is calm & cooperative.Denies suicidal or homicidal ideations & AV hallucinations.Appropriate with staff & peers.Compliant with medications.Attended groups.Appetite & energy level good.Support & encouragement given.

## 2016-08-04 LAB — GLUCOSE, CAPILLARY
GLUCOSE-CAPILLARY: 231 mg/dL — AB (ref 65–99)
Glucose-Capillary: 201 mg/dL — ABNORMAL HIGH (ref 65–99)
Glucose-Capillary: 209 mg/dL — ABNORMAL HIGH (ref 65–99)
Glucose-Capillary: 200 mg/dL — ABNORMAL HIGH (ref 65–99)

## 2016-08-04 NOTE — [facility] Group Notes (Signed)
Goals Group Date/Time: 08/04/2016 9:00 AM Type of Therapy and Topic: Group Therapy: Goals Group: SMART Goals   Participation Level: Moderate  Description of Group:    The purpose of a daily goals group is to assist and guide patients in setting recovery/wellness-related goals. The objective is to set goals as they relate to the crisis in which they were admitted. Patients will be using SMART goal modalities to set measurable goals. Characteristics of realistic goals will be discussed and patients will be assisted in setting and processing how one will reach their goal. Facilitator will also assist patients in applying interventions and coping skills learned in psycho-education groups to the SMART goal and process how one will achieve defined goal.   Therapeutic Goals:   -Patients will develop and document one goal related to or their crisis in which brought them into treatment.  -Patients will be guided by LCSW using SMART goal setting modality in how to set a measurable, attainable, realistic and time sensitive goal.  -Patients will process barriers in reaching goal.  -Patients will process interventions in how to overcome and successful in reaching goal.   Patient's Goal: Pt attempted to set goals for today but unable to grasp the concept of a short term goal for today.  [place] and her goal for today was to "taste the water" to see if it was appropriate for her garden.   Therapeutic Modalities:  Motivational Interviewing  Research officer, political partyCognitive Behavioral Therapy  Crisis Intervention Model  SMART goals setting   Daleen SquibbGreg Baya Lentz, KentuckyLCSW

## 2016-08-04 NOTE — [facility] Group Notes (Signed)
[facility] LCSW Group Therapy Note  Date/Time: 08/04/16, 0930  Type of Therapy/Topic:  Group Therapy:  Feelings about Diagnosis  Participation Level:  Did Not Attend   Mood:   Description of Group:    This group will allow patients to explore their thoughts and feelings about diagnoses they have received. Patients will be guided to explore their level of understanding and acceptance of these diagnoses. Facilitator will encourage patients to process their thoughts and feelings about the reactions of others to their diagnosis, and will guide patients in identifying ways to discuss their diagnosis with significant others in their lives. This group will be process-oriented, with patients participating in exploration of their own experiences as well as giving and receiving support and challenge from other group members.   Therapeutic Goals: 1. Patient will demonstrate understanding of diagnosis as evidence by identifying two or more symptoms of the disorder:  2. Patient will be able to express two feelings regarding the diagnosis 3. Patient will demonstrate ability to communicate their needs through discussion and/or role plays  Summary of Patient Progress:        Therapeutic Modalities:   Cognitive Behavioral Therapy Brief Therapy Feelings Identification   Greg Haytham Maher, LCSW 

## 2016-08-04 NOTE — Progress Notes (Signed)
D. Pt is calm, cooperative and pleasant.. Pt currently denies SI/HI and AVH and agrees to contact staff before acting on any harmful thoughts. Pt rates her depression, anxiety and feelings of hopelessness a 0/10. Pt reports that her goal for the day is getting sleep, stating," sleep is eluding me".  A. Labs and vitals monitored. Pt compliant with medications. Pt supported emotionally and encouraged to express concerns and ask questions.   R. Pt remains safe with 15 minute checks. Will continue POC.

## 2016-08-04 NOTE — Plan of Care (Signed)
Problem: Safety: Goal: Ability to remain free from injury will improve Outcome: Not Applicable Date Met: 40/33/53 Calm and cooperative. Isolates in room except for medications and snacks. Med adm w/ education. No voiced thoughts of hurting herself.pt remains free from harm.

## 2016-08-04 NOTE — [facility] Group Notes (Signed)
[facility] LCSW Group Therapy  08/04/2016 5:13 PM   Type of Therapy/Topic:  Group Therapy:  Feelings about Diagnosis  Participation Level:  Minimal   Mood: Reports okay mood and that she really wants to go home   Description of Group:    This group will allow patients to explore their thoughts and feelings about diagnoses they have received. Patients will be guided to explore their level of understanding and acceptance of these diagnoses. Facilitator will encourage patients to process their thoughts and feelings about the reactions of others to their diagnosis, and will guide patients in identifying ways to discuss their diagnosis with significant others in their lives. This group will be process-oriented, with patients participating in exploration of their own experiences as well as giving and receiving support and challenge from other group members.   Therapeutic Goals: 1. Patient will demonstrate understanding of diagnosis as evidence by identifying two or more symptoms of the disorder:  2. Patient will be able to express two feelings regarding the diagnosis 3. Patient will demonstrate ability to communicate their needs through discussion and/or role plays  Summary of Patient Progress:  Pt able to complete the above therapeutic goals.     Therapeutic Modalities:   Cognitive Behavioral Therapy Brief Therapy Feelings Identification     Glennon MacSara P Reynard Christoffersen, MSW, LCSW 08/04/2016, 5:13 PM

## 2016-08-04 NOTE — Progress Notes (Signed)
Recreation Therapy Notes  Date: 03.13.18 Time: 9:30 am Location: Craft Room  Group Topic: Coping Skills  Goal Area(s) Addresses:  Patient will write at least one healthy coping skill. Patient will verbalize ways to use healthy coping skills.  Behavioral Response: Intermittently Attentive  Intervention: Coping Skills Alphabet  Activity: Patients were given a Coping Skills Alphabet worksheet and were instructed to write healthy coping skills for each letter of the alphabet.  Education: LRT educated patients on healthy coping skills.  Education Outcome: In group clarification offered  Clinical Observations/Feedback: Patient wrote some healthy leisure activities like "drawing" patient also wrote things like "orange juice beans". Patient did not contribute to group discussion.  Jacquelynn CreeGreene,Zakaria Fromer M, LRT/CTRS 08/04/2016 10:18 AM

## 2016-08-04 NOTE — Progress Notes (Signed)
[facility] MD Progress Note  08/04/2016 2:27 PM BIBI ECONOMOS  MRN:  [id number] Subjective: Patient is a 61 year old married Caucasian female from [facility] [place]. Patient came to our emergency department voluntarily on March 7 in the company of her husband. The patient complained of feeling "manic". Her husband reported that she had run away from the home earlier in the day.  Patient complained of not being able to think clearly. She stated she had racing thoughts and was unable to concentrate. Some staff in the emergency Department described her as having thought blocking.  Patient carries a diagnosis of bipolar disorder versus a schizoaffective disorder bipolar type. She follows up with Dr.Headen at [facility]. Patient says that for the most part she is being compliant with her medications but thinks that over the last few  days she had missed a few doses.  Patient is currently prescribed with Tegretol, Zonegran, Seroquel and Geodon.  Patient was family limited historian today. She was pleasant and cooperative. She says that for the last couple weeks she has been having significant trouble sleeping. She is only resting about one hour a night. She says she stays up reading books. She also has been hearing voices of the nature planetary influences---gods talking to her trying to tell her to make changes in the world. She feels that her energy and her mood are elevated. She denies suicidality or homicidality and she also denies having visual hallucinations.  As far as substance abuse he denies having any history of substance use. She does not smoke cigarettes  Trauma history she denies  3/10 Patient states she feels okay today. She attended many groups yesterday reports they are helping, she is learning about preventing relapse. Slept well, reports meds are helping, appetite is normal/avg. Denies SI/HI/side effects to meds.  Answer are vague and short. No really any  change since  admission  3/11 Pt reports she did not sleep well last night, says she may have slept for 1/2 hour last night. She is still hearing some voices, energy level 4/10, reports healthy appetite and able to concentrate and stay on task. Denies side effects to meds or physical complaints. Wants to "inflate self esteem". Denies SI/HI or visual hallucinations.  3/12 3/12 Patient has no new complaints today. She had a few episodes of nausea and vomiting last night, says shes not having any problems today. Appears to be doing well, wants to rest some more before going home. Reports no SI/HI or auditory/visual hallucinations today.  Thought proces still bradyphrenic.  Answer are vague and short.  3/13 patient's answers are very vague. She has been withdrawn. She has limited participation in groups. She is usually found sleeping in her room. Says she is doing better. He said that she didn't feel ready to go home yet but was unable to elaborate as to why. There has been knowing problem men in mood, thought processes still bradyphrenic. She denies hallucinations. Has been sleeping a little bit better over the last 2 days   Per nursing D. Pt is calm, cooperative and pleasant.. Pt currently denies SI/HI and AVH and agrees to contact staff before acting on any harmful thoughts. Pt rates her depression, anxiety and feelings of hopelessness a 0/10. Pt reports that her goal for the day is getting sleep, stating," sleep is eluding me".  A. Labs and vitals monitored. Pt compliant with medications. Pt supported emotionally and encouraged to express concerns and ask questions.   R. Pt remains safe with  15 minute checks. Will continue POC.  Principal Problem: Schizoaffective disorder, bipolar type (HCC) Diagnosis:   Patient Active Problem List   Diagnosis Date Noted  . Schizoaffective disorder, bipolar type (HCC) [F25.0] 08/02/2016  . Dyslipidemia [E78.5] 07/31/2016  . Akathisia [G25.71] 07/31/2016  . Vitamin D  deficiency [E55.9] 07/31/2016  . Diabetes mellitus type 2, diet-controlled (HCC) [E11.9] 04/11/2015   Total Time spent with patient: 30 minutes  Past Psychiatric History: Patient states she is being hospitalized a multitude of time since the age of 61 when she was first diagnosed with bipolar disorder. She says that her last hospitalization was here in 2015. She denies any history of self injury or suicidal attempts  Past Medical History:  Past Medical History:  Diagnosis Date  . Bipolar disorder (HCC)   . Chronic kidney disease   . Diabetes mellitus without complication (HCC)   . Hypercholesteremia   . Manic behavior (HCC)     Past Surgical History:  Procedure Laterality Date  . COLONOSCOPY WITH PROPOFOL N/A 03/11/2015   Procedure: COLONOSCOPY WITH PROPOFOL;  Surgeon: Wallace CullensPaul Y Oh, MD;  Location: [facility];  Service: Gastroenterology;  Laterality: N/A;   Family History:  Family History  Problem Relation Age of Onset  . Diabetes Father   . Diabetes Sister    Family Psychiatric  History: Patient denies having any family members with history of suicides, mental illness or substance abuse  Social History:  History  Alcohol Use  . 1.2 oz/week  . 2 Glasses of wine per week    Comment: Occ     History  Drug Use No    Social History   Social History  . Marital status: Married    Spouse name: N/A  . Number of children: N/A  . Years of education: N/A   Social History Main Topics  . Smoking status: Never Smoker  . Smokeless tobacco: Never Used  . Alcohol use 1.2 oz/week    2 Glasses of wine per week     Comment: Occ  . Drug use: No  . Sexual activity: Not Asked   Other Topics Concern  . None   Social History Narrative  . None   Additional Social History:      Sleep: Fair  Appetite:  Fair  Current Medications: Current Facility-Administered Medications  Medication Dose Route Frequency Provider Last Rate Last Dose  . acetaminophen (TYLENOL) tablet 650 mg   650 mg Oral Q6H PRN Audery AmelJohn T Clapacs, MD      . alum & mag hydroxide-simeth (MAALOX/MYLANTA) 200-200-20 MG/5ML suspension 30 mL  30 mL Oral Q4H PRN Audery AmelJohn T Clapacs, MD      . aspirin EC tablet 81 mg  81 mg Oral Daily Audery AmelJohn T Clapacs, MD   81 mg at 08/04/16 0852  . calcium-vitamin D (OSCAL WITH D) 500-200 MG-UNIT per tablet 1 tablet  1 tablet Oral BID Audery AmelJohn T Clapacs, MD   1 tablet at 08/04/16 [phone]  . carbamazepine (TEGRETOL) tablet 400 mg  400 mg Oral BID Jimmy FootmanAndrea Hernandez-Gonzalez, MD   400 mg at 08/04/16 0848  . cholecalciferol (VITAMIN D) tablet 400 Units  400 Units Oral Daily Audery AmelJohn T Clapacs, MD   400 Units at 08/04/16 0849  . insulin aspart (novoLOG) injection 0-5 Units  0-5 Units Subcutaneous QHS Jimmy FootmanAndrea Hernandez-Gonzalez, MD   2 Units at 08/03/16 2205  . insulin aspart (novoLOG) injection 0-9 Units  0-9 Units Subcutaneous TID WC Jimmy FootmanAndrea Hernandez-Gonzalez, MD   3 Units at 08/04/16 1203  .  insulin aspart (novoLOG) injection 6 Units  6 Units Subcutaneous TID WC Jimmy Footman, MD   6 Units at 08/04/16 1204  . LORazepam (ATIVAN) tablet 1 mg  1 mg Oral QHS PRN Jimmy Footman, MD      . LORazepam (ATIVAN) tablet 2 mg  2 mg Oral QHS Jimmy Footman, MD   2 mg at 08/03/16 2205  . magnesium hydroxide (MILK OF MAGNESIA) suspension 30 mL  30 mL Oral Daily PRN Audery Amel, MD      . metFORMIN (GLUCOPHAGE) tablet 1,000 mg  1,000 mg Oral BID WC Jimmy Footman, MD   1,000 mg at 08/04/16 0849  . ondansetron (ZOFRAN) tablet 4 mg  4 mg Oral Q4H PRN Jimmy Footman, MD      . propranolol (INDERAL) tablet 20 mg  20 mg Oral BID Audery Amel, MD   20 mg at 08/04/16 0847  . rosuvastatin (CRESTOR) tablet 40 mg  40 mg Oral Daily Audery Amel, MD   40 mg at 08/04/16 0850  . ziprasidone (GEODON) capsule 80 mg  80 mg Oral BID WC Jimmy Footman, MD   80 mg at 08/04/16 1478    Lab Results:  Results for orders placed or performed during the hospital  encounter of 07/30/16 (from the past 48 hour(s))  Glucose, capillary     Status: Abnormal   Collection Time: 08/02/16  4:35 PM  Result Value Ref Range   Glucose-Capillary 193 (H) 65 - 99 mg/dL  Glucose, capillary     Status: Abnormal   Collection Time: 08/02/16  9:17 PM  Result Value Ref Range   Glucose-Capillary 293 (H) 65 - 99 mg/dL  Glucose, capillary     Status: Abnormal   Collection Time: 08/03/16  7:15 AM  Result Value Ref Range   Glucose-Capillary 211 (H) 65 - 99 mg/dL  Glucose, capillary     Status: Abnormal   Collection Time: 08/03/16 11:44 AM  Result Value Ref Range   Glucose-Capillary 220 (H) 65 - 99 mg/dL  Glucose, capillary     Status: Abnormal   Collection Time: 08/03/16  4:48 PM  Result Value Ref Range   Glucose-Capillary 163 (H) 65 - 99 mg/dL  Glucose, capillary     Status: Abnormal   Collection Time: 08/03/16  8:22 PM  Result Value Ref Range   Glucose-Capillary 212 (H) 65 - 99 mg/dL  Glucose, capillary     Status: Abnormal   Collection Time: 08/04/16  6:35 AM  Result Value Ref Range   Glucose-Capillary 201 (H) 65 - 99 mg/dL  Glucose, capillary     Status: Abnormal   Collection Time: 08/04/16 11:55 AM  Result Value Ref Range   Glucose-Capillary 209 (H) 65 - 99 mg/dL    Blood Alcohol level:  Lab Results  Component Value Date   ETH <5 07/29/2016   ETH 134 (H) 05/14/2016    Metabolic Disorder Labs: Lab Results  Component Value Date   HGBA1C 7.8 (H) 07/31/2016   MPG 177 07/31/2016   Lab Results  Component Value Date   PROLACTIN 3.6 (L) 07/31/2016   Lab Results  Component Value Date   CHOL 179 07/31/2016   TRIG 136 07/31/2016   HDL 51 07/31/2016   CHOLHDL 3.5 07/31/2016   VLDL 27 07/31/2016   LDLCALC 101 (H) 07/31/2016    Physical Findings: AIMS: Facial and Oral Movements Muscles of Facial Expression: None, normal Lips and Perioral Area: None, normal Jaw: None, normal Tongue: None, normal,Extremity Movements Upper (  arms, wrists, hands,  fingers): None, normal Lower (legs, knees, ankles, toes): None, normal, Trunk Movements Neck, shoulders, hips: None, normal, Overall Severity Severity of abnormal movements (highest score from questions above): None, normal Incapacitation due to abnormal movements: None, normal Patient's awareness of abnormal movements (rate only patient's report): No Awareness, Dental Status Current problems with teeth and/or dentures?: No Does patient usually wear dentures?: No  CIWA:  CIWA-Ar Total: 0 COWS:     Musculoskeletal: Strength & Muscle Tone: within normal limits Gait & Station: normal Patient leans: N/A  Psychiatric Specialty Exam: Physical Exam  Constitutional: She is oriented to person, place, and time. She appears well-developed and well-nourished.  HENT:  Head: Normocephalic.  Eyes: EOM are normal.  Musculoskeletal: Normal range of motion.  Neurological: She is alert and oriented to person, place, and time.    Review of Systems  Constitutional: Negative.   HENT: Negative.   Eyes: Negative.   Respiratory: Negative.   Cardiovascular: Negative.   Gastrointestinal: Positive for nausea and vomiting.  Genitourinary: Negative.   Musculoskeletal: Negative.   Skin: Negative.   Neurological: Negative.   Endo/Heme/Allergies: Negative.   Psychiatric/Behavioral: Positive for depression.    Blood pressure 122/78, pulse 91, temperature 97.9 F (36.6 C), temperature source Oral, resp. rate 18, height 5\' 2"  (1.575 m), weight 80.1 kg (176 lb 8 oz), SpO2 99 %.Body mass index is 32.28 kg/m.  General Appearance: Fairly Groomed  Eye Contact:  Good  Speech:  Clear and Coherent  Volume:  Normal  Mood:  Euthymic  Affect:  Appropriate  Thought Process:  Linear and Descriptions of Associations: Intact  Orientation:  Full (Time, Place, and Person)  Thought Content:  Hallucinations: None Thought is bradyphrenic  Suicidal Thoughts:  No  Homicidal Thoughts:  No  Memory:  Immediate;    Fair Recent;   Fair Remote;   Fair  Judgement:  Poor  Insight:  Fair  Psychomotor Activity:  Decreased  Concentration:  Concentration: Fair and Attention Span: Fair  Recall:  [employer] of Knowledge:  Fair  Language:  Good  Akathisia:  No  Handed:    AIMS (if indicated):     Assets:  Manufacturing systems engineer Physical Health Social Support  ADL's:  Intact  Cognition:  Impaired,  Mild  Sleep:  Number of Hours: 7.15     Treatment Plan Summary: Daily contact with patient to assess and evaluate symptoms and progress in treatment   Some improvement.  Thought process still bradyphrenic. Has significant psychomotor retardation, improved some  Patient is a 61 year old Caucasian female with bipolar disorder who presented to our emergency department with a manic episode and psychotic features.  Triggers for this episode appeared to be noncompliance  Bipolar disorder type I patient will be continued on Tegretol  400 mg twice a day.  Tegretol level on 200 mg bid was 5.3.  Will check another Tegretol level on Thursday morning  Increased Geodon  to 80 mg po bid with meals  Zonegran and Seroquel have been  discontinued  Akathisia: Patient reports prior history of akathisia with Geodon will continue Inderal 20 mg twice a day  Tardive dyskinesia: Patient has significant involuntary movements in her mouth  Insomnia: continue ativan 2 mg po qhs  Vitamin D deficiency continue vitamin D 400 units a day and Citra cal daily  Diabetes continue metformin but will increase to 1000 mg po bid.  Also added novolog 6 U tid. Patient is on a carb modified diet. I have place orders  for supplemental insulin--- spoke with diabetes coordinator. They stated that when the patient is ready for discharge the recommendation will be to only discharge on the metformin as hemoglobin A1c is only 7.8  Dyslipidemia continue Crestor 40 mg a day  Nausea- Zofran 4 mg q4 hrs prn  Labs- hemoglobin A1c 7.8%, lipid  panel slight elevation of LDL (101) and TSH wnl  Diet carb modified  Vital signs daily  Hospitalization status voluntary  Disposition she will be discharged in the next 5 days.  F/U continue to follow-up with Dr. Omelia Blackwater  Records were reviewed from prior hospitalization here in 2015. She was discharged with a diagnosis of bipolar disorder.  Jimmy Footman, MD 08/04/2016, 2:27 PM

## 2016-08-05 LAB — GLUCOSE, CAPILLARY
GLUCOSE-CAPILLARY: 235 mg/dL — AB (ref 65–99)
GLUCOSE-CAPILLARY: 217 mg/dL — AB (ref 65–99)
Glucose-Capillary: 235 mg/dL — ABNORMAL HIGH (ref 65–99)
Glucose-Capillary: 214 mg/dL — ABNORMAL HIGH (ref 65–99)

## 2016-08-05 NOTE — [facility] Group Notes (Signed)
[facility] Group Notes:  (Nursing/MHT/Case Management/Adjunct)  Date:  08/05/2016  Time:  12:32 AM  Type of Therapy:  Psychoeducational Skills  Participation Level:  Active  Participation Quality:  Appropriate  Affect:  Appropriate  Cognitive:  Alert  Insight:  Good  Engagement in Group:  None  Modes of Intervention:  Support  Summary of Progress/Problems:  Mayra NeerJackie L Reeanna Acri 08/05/2016, 12:32 AM

## 2016-08-05 NOTE — Progress Notes (Signed)
Patient stated that she could not sleep last night.Rated her depression & anxiety 2/10.Denies suicidal or homicidal ideations and AV hallucinations.Appropriate with staff & peers.Compliant with medications.Attended groups.Support & encouragement given.

## 2016-08-05 NOTE — Plan of Care (Signed)
Problem: [facility] Participation in Recreation Therapeutic Interventions Goal: STG-Other Recreation Therapy Goal (Specify) STG: Time Management - Within 4 treatment sessions, patient will verbalize understanding of scheduling in each of 2 treatment sessions to increase time management skills.  Outcome: Completed/Met Date Met: 08/05/16 Treatment Session 2; Completed 2 out of 2: At approximately 2:10 pm, LRT met with patient in patient room. Patient verbalized understanding of scheduling. LRT encouraged patient to use her schedules to help her manage her time better.  Leonette Monarch, LRT/CTRS 03.14.18 2:31 pm  Problem: [facility] Participation in Recreation Therapeutic Interventions Goal: STG-Other Recreation Therapy Goal (Specify) STG: Stress Management - Within 4 treatment sessions, patient will verbalize understanding of the stress management techniques in each of 2 treatment sessions to increase stress management skills.  Outcome: Completed/Met Date Met: 08/05/16 Treatment Session 2; Completed 2 out of 2: At approximately 2:10 pm, LRT met with patient in patient room. Patient verbalized understanding of the stress management techniques. LRT encouraged patient to use her techniques to help her manage her stress.  Leonette Monarch, LRT/CTRS 03.14.18 2:33 pm

## 2016-08-05 NOTE — Progress Notes (Signed)
Pt was out and visible in milieu this HS hour but with minimum interaction with peers. Pt is calm cooperative and compliant this shift. Pt denies any SI/HI/VAH, moods appears to be flat with congruent affect. Pt reports to the writer that she's happy this PM for finally eating what she likes for snacks. Pt said she's not happy to be on a restricted diet it's managing. Pt's HS CBG 200 and requires no coverage. No further concerns voice, will continue to monitor closely for safety.

## 2016-08-05 NOTE — Tx Team (Signed)
Interdisciplinary Treatment and Diagnostic Plan Update  08/05/2016 Time of Session: 10:30 Rita Sullivan MRN: [id number]  Principal Diagnosis: Schizoaffective disorder, bipolar type [facility])  Secondary Diagnoses: Principal Problem:   Schizoaffective disorder, bipolar type (HCC) Active Problems:   Diabetes mellitus type 2, diet-controlled (HCC)   Dyslipidemia   Akathisia   Vitamin D deficiency   Current Medications:  Current Facility-Administered Medications  Medication Dose Route Frequency Provider Last Rate Last Dose  . acetaminophen (TYLENOL) tablet 650 mg  650 mg Oral Q6H PRN Audery Amel, MD      . alum & mag hydroxide-simeth (MAALOX/MYLANTA) 200-200-20 MG/5ML suspension 30 mL  30 mL Oral Q4H PRN Audery Amel, MD      . aspirin EC tablet 81 mg  81 mg Oral Daily Audery Amel, MD   81 mg at 08/05/16 7829  . calcium-vitamin D (OSCAL WITH D) 500-200 MG-UNIT per tablet 1 tablet  1 tablet Oral BID Audery Amel, MD   1 tablet at 08/05/16 [phone]  . carbamazepine (TEGRETOL) tablet 400 mg  400 mg Oral BID Jimmy Footman, MD   400 mg at 08/05/16 3086  . cholecalciferol (VITAMIN D) tablet 400 Units  400 Units Oral Daily Audery Amel, MD   400 Units at 08/05/16 [phone]  . insulin aspart (novoLOG) injection 0-5 Units  0-5 Units Subcutaneous QHS Jimmy Footman, MD   2 Units at 08/03/16 2205  . insulin aspart (novoLOG) injection 0-9 Units  0-9 Units Subcutaneous TID WC Jimmy Footman, MD   3 Units at 08/05/16 1214  . insulin aspart (novoLOG) injection 6 Units  6 Units Subcutaneous TID WC Jimmy Footman, MD   6 Units at 08/05/16 1214  . LORazepam (ATIVAN) tablet 1 mg  1 mg Oral QHS PRN Jimmy Footman, MD      . LORazepam (ATIVAN) tablet 2 mg  2 mg Oral QHS Jimmy Footman, MD   2 mg at 08/04/16 2136  . magnesium hydroxide (MILK OF MAGNESIA) suspension 30 mL  30 mL Oral Daily PRN Audery Amel, MD      . metFORMIN (GLUCOPHAGE)  tablet 1,000 mg  1,000 mg Oral BID WC Jimmy Footman, MD   1,000 mg at 08/05/16 6962  . ondansetron (ZOFRAN) tablet 4 mg  4 mg Oral Q4H PRN Jimmy Footman, MD      . propranolol (INDERAL) tablet 20 mg  20 mg Oral BID Audery Amel, MD   20 mg at 08/05/16 9528  . rosuvastatin (CRESTOR) tablet 40 mg  40 mg Oral Daily Audery Amel, MD   40 mg at 08/05/16 0838  . ziprasidone (GEODON) capsule 80 mg  80 mg Oral BID WC Jimmy Footman, MD   80 mg at 08/05/16 4132   PTA Medications: Prescriptions Prior to Admission  Medication Sig Dispense Refill Last Dose  . aspirin EC 81 MG tablet Take 81 mg by mouth daily.   Taking  . calcium citrate-vitamin D (CITRACAL+D) 315-200 MG-UNIT tablet Take 1 tablet by mouth 2 (two) times daily.   Taking  . carbamazepine (TEGRETOL) 200 MG tablet Take 200 mg by mouth 2 (two) times daily.    Taking  . cholecalciferol (VITAMIN D) 400 UNITS TABS tablet Take 400 Units by mouth daily.   Taking  . folic acid (FOLVITE) 400 MCG tablet Take 400 mcg by mouth daily.     . metFORMIN (GLUCOPHAGE) 500 MG tablet Take 1 tablet (500 mg total) by mouth 2 (two) times daily with a meal.  60 tablet 11   . propranolol (INDERAL) 20 MG tablet Take 20 mg by mouth 2 (two) times daily.    Taking  . QUEtiapine (SEROQUEL) 300 MG tablet Take 300 mg by mouth at bedtime.    Taking  . rosuvastatin (CRESTOR) 40 MG tablet Take 40 mg by mouth daily.   Taking  . vitamin C (ASCORBIC ACID) 500 MG tablet Take 500 mg by mouth daily.   Taking  . ziprasidone (GEODON) 20 MG capsule Take 20 mg by mouth 3 (three) times daily.     Marland Kitchen zonisamide (ZONEGRAN) 100 MG capsule Take 100 mg by mouth 2 (two) times daily.   Taking  Patient Stressors:    Patient Strengths: Motivation for treatment/growth Supportive family/friends  Treatment Modalities: Medication Management, Group therapy, Case management,  1 to 1 session with clinician, Psychoeducation, Recreational therapy.   Physician  Treatment Plan for Primary Diagnosis: Schizoaffective disorder, bipolar type (HCC) Long Term Goal(s): Improvement in symptoms so as ready for discharge Improvement in symptoms so as ready for discharge   Short Term Goals: Ability to identify and develop effective coping behaviors will improve Ability to identify triggers associated with substance abuse/mental health issues will improve Ability to identify changes in lifestyle to reduce recurrence of condition will improve Ability to verbalize feelings will improve  Medication Management: Evaluate patient's response, side effects, and tolerance of medication regimen.  Therapeutic Interventions: 1 to 1 sessions, Unit Group sessions and Medication administration.  Evaluation of Outcomes: Progressing  Physician Treatment Plan for Secondary Diagnosis: Principal Problem:   Schizoaffective disorder, bipolar type (HCC) Active Problems:   Diabetes mellitus type 2, diet-controlled (HCC)   Dyslipidemia   Akathisia   Vitamin D deficiency  Long Term Goal(s): Improvement in symptoms so as ready for discharge Improvement in symptoms so as ready for discharge   Short Term Goals: Ability to identify and develop effective coping behaviors will improve Ability to identify triggers associated with substance abuse/mental health issues will improve Ability to identify changes in lifestyle to reduce recurrence of condition will improve Ability to verbalize feelings will improve     Medication Management: Evaluate patient's response, side effects, and tolerance of medication regimen.  Therapeutic Interventions: 1 to 1 sessions, Unit Group sessions and Medication administration.  Evaluation of Outcomes: Progressing   RN Treatment Plan for Primary Diagnosis: Schizoaffective disorder, bipolar type (HCC) Long Term Goal(s): Knowledge of disease and therapeutic regimen to maintain health will improve  Short Term Goals: Ability to demonstrate self-control,  Ability to participate in decision making will improve, Ability to verbalize feelings will improve, Ability to disclose and discuss suicidal ideas, Ability to identify and develop effective coping behaviors will improve and Compliance with prescribed medications will improve  Medication Management: RN will administer medications as ordered by provider, will assess and evaluate patient's response and provide education to patient for prescribed medication. RN will report any adverse and/or side effects to prescribing provider.  Therapeutic Interventions: 1 on 1 counseling sessions, Psychoeducation, Medication administration, Evaluate responses to treatment, Monitor vital signs and CBGs as ordered, Perform/monitor CIWA, COWS, AIMS and Fall Risk screenings as ordered, Perform wound care treatments as ordered.  Evaluation of Outcomes: Progressing   LCSW Treatment Plan for Primary Diagnosis: Schizoaffective disorder, bipolar type (HCC) Long Term Goal(s): Safe transition to appropriate next level of care at discharge, Engage patient in therapeutic group addressing interpersonal concerns.  Short Term Goals: Engage patient in aftercare planning with referrals and resources, Increase ability to appropriately verbalize feelings, Identify  triggers associated with mental health/substance abuse issues and Increase skills for wellness and recovery  Therapeutic Interventions: Assess for all discharge needs, 1 to 1 time with Social worker, Explore available resources and support systems, Assess for adequacy in community support network, Educate family and significant other(s) on suicide prevention, Complete Psychosocial Assessment, Interpersonal group therapy.  Evaluation of Outcomes: Progressing   Progress in Treatment: Attending groups: Yes. Participating in groups: Yes. Taking medication as prescribed: Yes. Toleration medication: Yes. Family/Significant other contact made: Yes, individual(s) contacted:   husband Patient understands diagnosis: Yes. Discussing patient identified problems/goals with staff: Yes. Medical problems stabilized or resolved: Yes. Denies suicidal/homicidal ideation: Yes. Issues/concerns per patient self-inventory: Yes. Other:    New problem(s) identified: No, Describe:     New Short Term/Long Term Goal(s):  Discharge Plan or Barriers: Home to husband with [facility]  Reason for Continuation of Hospitalization: Delusions  Hallucinations Medication stabilization  Estimated Length of Stay:1 days   Attendees: Patient: Rita Sullivan 08/05/2016 5:24 PM  Physician: Ardyth Harps 08/05/2016 5:24 PM  Nursing: Shelia Media, RN 08/05/2016 5:24 PM  RN Care Manager: 08/05/2016 5:24 PM  Social Worker: Huntley Dec Obera Stauch, LCSW 08/05/2016 5:24 PM  Recreational Therapist: Hershal Coria, LRT 08/05/2016 5:24 PM  Other:  08/05/2016 5:24 PM  Other:  08/05/2016 5:24 PM  Other: 08/05/2016 5:24 PM    Scribe for Treatment Team: Glennon Mac, LCSW 08/05/2016 5:24 PM

## 2016-08-05 NOTE — Plan of Care (Signed)
Problem: Safety: Goal: Ability to remain free from injury will improve Outcome: Progressing Pt has remained free from self harm this evening.

## 2016-08-05 NOTE — Progress Notes (Signed)
Recreation Therapy Notes  Date: 03.14.18 Time: 9:30 am Location: Craft Room  Group Topic: Self-esteem  Goal Area(s) Addresses:  Patient will write at least one positive trait about self. Patient will verbalize benefit of having healthy self-esteem.  Behavioral Response: Did not attend  Intervention: I Am  Activity: Patients were given a worksheet with the letter I on it and were instructed to write as many positive traits about themselves inside the letter.  Education: LRT educated patients on ways they can increase their self-esteem.  Education Outcome: Patient did not attend group.  Clinical Observations/Feedback: Patient did not attend group.  Jacquelynn CreeGreene,Quintella Mura M, LRT/CTRS 08/05/2016 10:07 AM

## 2016-08-05 NOTE — Progress Notes (Addendum)
[facility] MD Progress Note  08/06/2016 9:16 AM Rita Sullivan  MRN:  [id number] Subjective: Patient is a 61 year old married Caucasian female from [facility]. Patient came to our emergency department voluntarily on March 7 in the company of her husband. The patient complained of feeling "manic". Her husband reported that she had run away from the home earlier in the day.  Patient complained of not being able to think clearly. She stated she had racing thoughts and was unable to concentrate. Some staff in the emergency Department described her as having thought blocking.  Patient carries a diagnosis of bipolar disorder versus a schizoaffective disorder bipolar type. She follows up with Dr.Headen at [facility]. Patient says that for the most part she is being compliant with her medications but thinks that over the last few  days she had missed a few doses.  Patient is currently prescribed with Tegretol, Zonegran, Seroquel and Geodon.  Patient was family limited historian today. She was pleasant and cooperative. She says that for the last couple weeks she has been having significant trouble sleeping. She is only resting about one hour a night. She says she stays up reading books. She also has been hearing voices of the nature planetary influences---gods talking to her trying to tell her to make changes in the world. She feels that her energy and her mood are elevated. She denies suicidality or homicidality and she also denies having visual hallucinations.  As far as substance abuse he denies having any history of substance use. She does not smoke cigarettes  Trauma history she denies  3/10 Patient states she feels okay today. She attended many groups yesterday reports they are helping, she is learning about preventing relapse. Slept well, reports meds are helping, appetite is normal/avg. Denies SI/HI/side effects to meds.  Answer are vague and short. No really any  change since  admission  3/11 Pt reports she did not sleep well last night, says she may have slept for 1/2 hour last night. She is still hearing some voices, energy level 4/10, reports healthy appetite and able to concentrate and stay on task. Denies side effects to meds or physical complaints. Wants to "inflate self esteem". Denies SI/HI or visual hallucinations.  3/12 3/12 Patient has no new complaints today. She had a few episodes of nausea and vomiting last night, says shes not having any problems today. Appears to be doing well, wants to rest some more before going home. Reports no SI/HI or auditory/visual hallucinations today.  Thought proces still bradyphrenic.  Answer are vague and short.  3/13 patient's answers are very vague. She has been withdrawn. She has limited participation in groups. She is usually found sleeping in her room. Says she is doing better. He said that she didn't feel ready to go home yet but was unable to elaborate as to why. There has been knowing problem men in mood, thought processes still bradyphrenic. She denies hallucinations. Has been sleeping a little bit better over the last 2 days  3/14 Patient reports shes feeling good today, says she will probably be ready to go home soon. Denies SI/HI or auditory/visual hallucinations today. Has a healthy appetite, requests more fluids with her meals. No complaints about meds, no physical complaints. Thought process still bradyphrenic, was able to sleep well last night.  Per nursing Pt was out and visible in milieu this HS hour but with minimum interaction with peers. Pt is calm cooperative and compliant this shift. Pt denies any SI/HI/VAH,  moods appears to be flat with congruent affect. Pt reports to the writer that she's happy this PM for finally eating what she likes for snacks. Pt said she's not happy to be on a restricted diet it's managing. Pt's HS CBG 200 and requires no coverage. No further concerns voice, will continue to monitor  closely for safety  Principal Problem: Schizoaffective disorder, bipolar type [facility](HCC) Diagnosis:   Patient Active Problem List   Diagnosis Date Noted  . Schizoaffective disorder, bipolar type (HCC) [F25.0] 08/02/2016  . Dyslipidemia [E78.5] 07/31/2016  . Akathisia [G25.71] 07/31/2016  . Vitamin D deficiency [E55.9] 07/31/2016  . Diabetes mellitus type 2, diet-controlled (HCC) [E11.9] 04/11/2015   Total Time spent with patient: 30 minutes  Past Psychiatric History: Patient states she is being hospitalized a multitude of time since the age of 89 when she was first diagnosed with bipolar disorder. She says that her last hospitalization was here in 2015. She denies any history of self injury or suicidal attempts  Past Medical History:  Past Medical History:  Diagnosis Date  . Bipolar disorder (HCC)   . Chronic kidney disease   . Diabetes mellitus without complication (HCC)   . Hypercholesteremia   . Manic behavior (HCC)     Past Surgical History:  Procedure Laterality Date  . COLONOSCOPY WITH PROPOFOL N/A 03/11/2015   Procedure: COLONOSCOPY WITH PROPOFOL;  Surgeon: Wallace CullensPaul Y Oh, MD;  Location: [facility] ENDOSCOPY;  Service: Gastroenterology;  Laterality: N/A;   Family History:  Family History  Problem Relation Age of Onset  . Diabetes Father   . Diabetes Sister    Family Psychiatric  History: Patient denies having any family members with history of suicides, mental illness or substance abuse  Social History:  History  Alcohol Use  . 1.2 oz/week  . 2 Glasses of wine per week    Comment: Occ     History  Drug Use No    Social History   Social History  . Marital status: Married    Spouse name: N/A  . Number of children: N/A  . Years of education: N/A   Social History Main Topics  . Smoking status: Never Smoker  . Smokeless tobacco: Never Used  . Alcohol use 1.2 oz/week    2 Glasses of wine per week     Comment: Occ  . Drug use: No  . Sexual activity: Not Asked   Other  Topics Concern  . None   Social History Narrative  . None   Additional Social History:      Sleep: Good  Appetite:  Good  Current Medications: Current Facility-Administered Medications  Medication Dose Route Frequency Provider Last Rate Last Dose  . acetaminophen (TYLENOL) tablet 650 mg  650 mg Oral Q6H PRN Audery AmelJohn T Clapacs, MD      . alum & mag hydroxide-simeth (MAALOX/MYLANTA) 200-200-20 MG/5ML suspension 30 mL  30 mL Oral Q4H PRN Audery AmelJohn T Clapacs, MD      . aspirin EC tablet 81 mg  81 mg Oral Daily Audery AmelJohn T Clapacs, MD   81 mg at 08/06/16 0834  . calcium-vitamin D (OSCAL WITH D) 500-200 MG-UNIT per tablet 1 tablet  1 tablet Oral BID Audery AmelJohn T Clapacs, MD   1 tablet at 08/06/16 [id number]  . carbamazepine (TEGRETOL) tablet 400 mg  400 mg Oral BID Jimmy FootmanAndrea Hernandez-Gonzalez, MD   400 mg at 08/06/16 0834  . cholecalciferol (VITAMIN D) tablet 400 Units  400 Units Oral Daily Audery AmelJohn T Clapacs, MD  400 Units at 08/06/16 0834  . insulin aspart (novoLOG) injection 0-5 Units  0-5 Units Subcutaneous QHS Jimmy Footman, MD   2 Units at 08/05/16 2122  . insulin aspart (novoLOG) injection 0-9 Units  0-9 Units Subcutaneous TID WC Jimmy Footman, MD   2 Units at 08/06/16 0730  . insulin aspart (novoLOG) injection 6 Units  6 Units Subcutaneous TID WC Jimmy Footman, MD   6 Units at 08/06/16 0732  . LORazepam (ATIVAN) tablet 1 mg  1 mg Oral QHS PRN Jimmy Footman, MD      . LORazepam (ATIVAN) tablet 2 mg  2 mg Oral QHS Jimmy Footman, MD   2 mg at 08/05/16 2125  . magnesium hydroxide (MILK OF MAGNESIA) suspension 30 mL  30 mL Oral Daily PRN Audery Amel, MD      . metFORMIN (GLUCOPHAGE) tablet 1,000 mg  1,000 mg Oral BID WC Jimmy Footman, MD   1,000 mg at 08/06/16 0834  . ondansetron (ZOFRAN) tablet 4 mg  4 mg Oral Q4H PRN Jimmy Footman, MD      . propranolol (INDERAL) tablet 20 mg  20 mg Oral BID Audery Amel, MD   20 mg at 08/06/16  0834  . rosuvastatin (CRESTOR) tablet 40 mg  40 mg Oral Daily Audery Amel, MD   40 mg at 08/06/16 0834  . ziprasidone (GEODON) capsule 80 mg  80 mg Oral BID WC Jimmy Footman, MD   80 mg at 08/06/16 0981    Lab Results:  Results for orders placed or performed during the hospital encounter of 07/30/16 (from the past 48 hour(s))  Glucose, capillary     Status: Abnormal   Collection Time: 08/04/16 11:55 AM  Result Value Ref Range   Glucose-Capillary 209 (H) 65 - 99 mg/dL  Glucose, capillary     Status: Abnormal   Collection Time: 08/04/16  4:43 PM  Result Value Ref Range   Glucose-Capillary 231 (H) 65 - 99 mg/dL  Glucose, capillary     Status: Abnormal   Collection Time: 08/04/16  8:19 PM  Result Value Ref Range   Glucose-Capillary 200 (H) 65 - 99 mg/dL  Glucose, capillary     Status: Abnormal   Collection Time: 08/05/16  5:40 AM  Result Value Ref Range   Glucose-Capillary 235 (H) 65 - 99 mg/dL  Glucose, capillary     Status: Abnormal   Collection Time: 08/05/16 11:26 AM  Result Value Ref Range   Glucose-Capillary 214 (H) 65 - 99 mg/dL   Comment 1 Notify RN   Glucose, capillary     Status: Abnormal   Collection Time: 08/05/16  5:11 PM  Result Value Ref Range   Glucose-Capillary 235 (H) 65 - 99 mg/dL  Glucose, capillary     Status: Abnormal   Collection Time: 08/05/16  9:15 PM  Result Value Ref Range   Glucose-Capillary 217 (H) 65 - 99 mg/dL  Carbamazepine level, total     Status: None   Collection Time: 08/06/16  6:41 AM  Result Value Ref Range   Carbamazepine Lvl 8.1 4.0 - 12.0 ug/mL  CBC with Differential/Platelet     Status: Abnormal   Collection Time: 08/06/16  6:41 AM  Result Value Ref Range   WBC 10.2 3.6 - 11.0 K/uL   RBC 4.55 3.80 - 5.20 MIL/uL   Hemoglobin 14.5 12.0 - 16.0 g/dL   HCT 19.1 47.8 - 29.5 %   MCV 92.2 80.0 - 100.0 fL   MCH 31.8 26.0 -  34.0 pg   MCHC 34.4 32.0 - 36.0 g/dL   RDW 16.1 09.6 - 04.5 %   Platelets 336 150 - 440 K/uL    Neutrophils Relative % 52 %   Neutro Abs 5.3 1.4 - 6.5 K/uL   Lymphocytes Relative 32 %   Lymphs Abs 3.3 1.0 - 3.6 K/uL   Monocytes Relative 10 %   Monocytes Absolute 1.0 (H) 0.2 - 0.9 K/uL   Eosinophils Relative 5 %   Eosinophils Absolute 0.5 0 - 0.7 K/uL   Basophils Relative 1 %   Basophils Absolute 0.1 0 - 0.1 K/uL  Glucose, capillary     Status: Abnormal   Collection Time: 08/06/16  6:43 AM  Result Value Ref Range   Glucose-Capillary 199 (H) 65 - 99 mg/dL    Blood Alcohol level:  Lab Results  Component Value Date   ETH <5 07/29/2016   ETH 134 (H) 05/14/2016    Metabolic Disorder Labs: Lab Results  Component Value Date   HGBA1C 7.8 (H) 07/31/2016   MPG 177 07/31/2016   Lab Results  Component Value Date   PROLACTIN 3.6 (L) 07/31/2016   Lab Results  Component Value Date   CHOL 179 07/31/2016   TRIG 136 07/31/2016   HDL 51 07/31/2016   CHOLHDL 3.5 07/31/2016   VLDL 27 07/31/2016   LDLCALC 101 (H) 07/31/2016    Physical Findings: AIMS: Facial and Oral Movements Muscles of Facial Expression: None, normal Lips and Perioral Area: None, normal Jaw: None, normal Tongue: None, normal,Extremity Movements Upper (arms, wrists, hands, fingers): None, normal Lower (legs, knees, ankles, toes): None, normal, Trunk Movements Neck, shoulders, hips: None, normal, Overall Severity Severity of abnormal movements (highest score from questions above): None, normal Incapacitation due to abnormal movements: None, normal Patient's awareness of abnormal movements (rate only patient's report): No Awareness, Dental Status Current problems with teeth and/or dentures?: No Does patient usually wear dentures?: No  CIWA:  CIWA-Ar Total: 0 COWS:     Musculoskeletal: Strength & Muscle Tone: within normal limits Gait & Station: normal Patient leans: N/A  Psychiatric Specialty Exam: Physical Exam  Constitutional: She is oriented to person, place, and time. She appears well-developed  and well-nourished.  HENT:  Head: Normocephalic.  Eyes: EOM are normal.  Musculoskeletal: Normal range of motion.  Neurological: She is alert and oriented to person, place, and time.    Review of Systems  Constitutional: Negative.   HENT: Negative.   Eyes: Negative.   Respiratory: Negative.   Cardiovascular: Negative.   Genitourinary: Negative.   Musculoskeletal: Negative.   Skin: Negative.   Neurological: Negative.   Endo/Heme/Allergies: Negative.   Psychiatric/Behavioral: Positive for depression.    Blood pressure (!) 119/93, pulse 84, temperature 98.3 F (36.8 C), resp. rate 18, height 5\' 2"  (1.575 m), weight 80.1 kg (176 lb 8 oz), SpO2 100 %.Body mass index is 32.28 kg/m.  General Appearance: Fairly Groomed  Eye Contact:  Good  Speech:  Clear and Coherent  Volume:  Normal  Mood:  Euthymic  Affect:  Constricted  Thought Process:  Linear and Descriptions of Associations: Intact  Orientation:  Full (Time, Place, and Person)  Thought Content:  Hallucinations: None Thought is bradyphrenic  Suicidal Thoughts:  No  Homicidal Thoughts:  No  Memory:  Immediate;   Fair Recent;   Fair Remote;   Fair  Judgement:  Poor  Insight:  Fair  Psychomotor Activity:  Decreased  Concentration:  Concentration: Fair and Attention Span: Fair  Recall:  Fair  [employer] of Knowledge:  Fair  Language:  Good  Akathisia:  No  Handed:    AIMS (if indicated):     Assets:  Manufacturing systems engineer Physical Health Social Support  ADL's:  Intact  Cognition:  Impaired,  Mild  Sleep:  Number of Hours: 7.15     Treatment Plan Summary: Daily contact with patient to assess and evaluate symptoms and progress in treatment   Some improvement.  Thought process still bradyphrenic. Has significant psychomotor retardation, improved some. No changes in medications today. We will be checking a Tegretol level tomorrow morning. Tegretol level is within the normal limits with plan to discharge her home.  Patient  is a 61 year old Caucasian female with bipolar disorder who presented to our emergency department with a manic episode and psychotic features.  Triggers for this episode appeared to be noncompliance  Bipolar disorder type I patient will be continued on Tegretol  400 mg twice a day.  Tegretol level on 200 mg bid was 5.3.  Will check another Tegretol level on Thursday morning  Increased Geodon  to 80 mg po bid with meals  Zonegran and Seroquel have been  discontinued  Akathisia: Patient reports prior history of akathisia with Geodon will continue Inderal 20 mg twice a day  Tardive dyskinesia: Patient has significant involuntary movements in her mouth  Insomnia: continue ativan 2 mg po qhs  Vitamin D deficiency continue vitamin D 400 units a day and Citra cal daily  Diabetes continue metformin but will increase to 1000 mg po bid.  Also added novolog 6 U tid. Patient is on a carb modified diet. I have place orders for supplemental insulin--- spoke with diabetes coordinator. They stated that when the patient is ready for discharge the recommendation will be to only discharge on the metformin as hemoglobin A1c is only 7.8  Dyslipidemia continue Crestor 40 mg a day  Nausea- Zofran 4 mg q4 hrs prn  Labs- hemoglobin A1c 7.8%, lipid panel slight elevation of LDL (101) and TSH wnl  Diet carb modified  Vital signs daily  Hospitalization status voluntary  Disposition she will be discharged in the next 1-2 days.  F/U continue to follow-up with Dr. Omelia Blackwater  Records were reviewed from prior hospitalization here in 2015. She was discharged with a diagnosis of bipolar disorder.  Jimmy Footman, MD 08/06/2016, 9:16 AM

## 2016-08-05 NOTE — Tx Team (Signed)
Interdisciplinary Treatment and Diagnostic Plan Update  08/03/2016 Time of Session: 10:30am Rita Sullivan MRN: [id number]  Principal Diagnosis: Schizoaffective disorder, bipolar type [facility])  Secondary Diagnoses: Principal Problem:   Schizoaffective disorder, bipolar type (HCC) Active Problems:   Diabetes mellitus type 2, diet-controlled (HCC)   Dyslipidemia   Akathisia   Vitamin D deficiency   Current Medications:  Current Facility-Administered Medications  Medication Dose Route Frequency Provider Last Rate Last Dose  . acetaminophen (TYLENOL) tablet 650 mg  650 mg Oral Q6H PRN Audery Amel, MD      . alum & mag hydroxide-simeth (MAALOX/MYLANTA) 200-200-20 MG/5ML suspension 30 mL  30 mL Oral Q4H PRN Audery Amel, MD      . aspirin EC tablet 81 mg  81 mg Oral Daily Audery Amel, MD   81 mg at 08/05/16 4098  . calcium-vitamin D (OSCAL WITH D) 500-200 MG-UNIT per tablet 1 tablet  1 tablet Oral BID Audery Amel, MD   1 tablet at 08/05/16 [phone]  . carbamazepine (TEGRETOL) tablet 400 mg  400 mg Oral BID Jimmy Footman, MD   400 mg at 08/05/16 4782  . cholecalciferol (VITAMIN D) tablet 400 Units  400 Units Oral Daily Audery Amel, MD   400 Units at 08/05/16 [phone]  . insulin aspart (novoLOG) injection 0-5 Units  0-5 Units Subcutaneous QHS Jimmy Footman, MD   2 Units at 08/03/16 2205  . insulin aspart (novoLOG) injection 0-9 Units  0-9 Units Subcutaneous TID WC Jimmy Footman, MD   3 Units at 08/05/16 1214  . insulin aspart (novoLOG) injection 6 Units  6 Units Subcutaneous TID WC Jimmy Footman, MD   6 Units at 08/05/16 1214  . LORazepam (ATIVAN) tablet 1 mg  1 mg Oral QHS PRN Jimmy Footman, MD      . LORazepam (ATIVAN) tablet 2 mg  2 mg Oral QHS Jimmy Footman, MD   2 mg at 08/04/16 2136  . magnesium hydroxide (MILK OF MAGNESIA) suspension 30 mL  30 mL Oral Daily PRN Audery Amel, MD      . metFORMIN (GLUCOPHAGE)  tablet 1,000 mg  1,000 mg Oral BID WC Jimmy Footman, MD   1,000 mg at 08/05/16 1308  . ondansetron (ZOFRAN) tablet 4 mg  4 mg Oral Q4H PRN Jimmy Footman, MD      . propranolol (INDERAL) tablet 20 mg  20 mg Oral BID Audery Amel, MD   20 mg at 08/05/16 6578  . rosuvastatin (CRESTOR) tablet 40 mg  40 mg Oral Daily Audery Amel, MD   40 mg at 08/05/16 0838  . ziprasidone (GEODON) capsule 80 mg  80 mg Oral BID WC Jimmy Footman, MD   80 mg at 08/05/16 4696   PTA Medications: Prescriptions Prior to Admission  Medication Sig Dispense Refill Last Dose  . aspirin EC 81 MG tablet Take 81 mg by mouth daily.   Taking  . calcium citrate-vitamin D (CITRACAL+D) 315-200 MG-UNIT tablet Take 1 tablet by mouth 2 (two) times daily.   Taking  . carbamazepine (TEGRETOL) 200 MG tablet Take 200 mg by mouth 2 (two) times daily.    Taking  . cholecalciferol (VITAMIN D) 400 UNITS TABS tablet Take 400 Units by mouth daily.   Taking  . folic acid (FOLVITE) 400 MCG tablet Take 400 mcg by mouth daily.     . metFORMIN (GLUCOPHAGE) 500 MG tablet Take 1 tablet (500 mg total) by mouth 2 (two) times daily with a meal.  60 tablet 11   . propranolol (INDERAL) 20 MG tablet Take 20 mg by mouth 2 (two) times daily.    Taking  . QUEtiapine (SEROQUEL) 300 MG tablet Take 300 mg by mouth at bedtime.    Taking  . rosuvastatin (CRESTOR) 40 MG tablet Take 40 mg by mouth daily.   Taking  . vitamin C (ASCORBIC ACID) 500 MG tablet Take 500 mg by mouth daily.   Taking  . ziprasidone (GEODON) 20 MG capsule Take 20 mg by mouth 3 (three) times daily.     Marland Kitchen zonisamide (ZONEGRAN) 100 MG capsule Take 100 mg by mouth 2 (two) times daily.   Taking    Patient Stressors:    Patient Strengths: Motivation for treatment/growth Supportive family/friends  Treatment Modalities: Medication Management, Group therapy, Case management,  1 to 1 session with clinician, Psychoeducation, Recreational therapy.   Physician  Treatment Plan for Primary Diagnosis: Schizoaffective disorder, bipolar type (HCC) Long Term Goal(s): Improvement in symptoms so as ready for discharge Improvement in symptoms so as ready for discharge   Short Term Goals: Ability to identify and develop effective coping behaviors will improve Ability to identify triggers associated with substance abuse/mental health issues will improve Ability to identify changes in lifestyle to reduce recurrence of condition will improve Ability to verbalize feelings will improve  Medication Management: Evaluate patient's response, side effects, and tolerance of medication regimen.  Therapeutic Interventions: 1 to 1 sessions, Unit Group sessions and Medication administration.  Evaluation of Outcomes: Progressing  Physician Treatment Plan for Secondary Diagnosis: Principal Problem:   Schizoaffective disorder, bipolar type (HCC) Active Problems:   Diabetes mellitus type 2, diet-controlled (HCC)   Dyslipidemia   Akathisia   Vitamin D deficiency  Long Term Goal(s): Improvement in symptoms so as ready for discharge Improvement in symptoms so as ready for discharge   Short Term Goals: Ability to identify and develop effective coping behaviors will improve Ability to identify triggers associated with substance abuse/mental health issues will improve Ability to identify changes in lifestyle to reduce recurrence of condition will improve Ability to verbalize feelings will improve     Medication Management: Evaluate patient's response, side effects, and tolerance of medication regimen.  Therapeutic Interventions: 1 to 1 sessions, Unit Group sessions and Medication administration.  Evaluation of Outcomes: Progressing   RN Treatment Plan for Primary Diagnosis: Schizoaffective disorder, bipolar type (HCC) Long Term Goal(s): Knowledge of disease and therapeutic regimen to maintain health will improve  Short Term Goals: Ability to demonstrate self-control,  Ability to participate in decision making will improve, Ability to verbalize feelings will improve, Ability to disclose and discuss suicidal ideas, Ability to identify and develop effective coping behaviors will improve and Compliance with prescribed medications will improve  Medication Management: RN will administer medications as ordered by provider, will assess and evaluate patient's response and provide education to patient for prescribed medication. RN will report any adverse and/or side effects to prescribing provider.  Therapeutic Interventions: 1 on 1 counseling sessions, Psychoeducation, Medication administration, Evaluate responses to treatment, Monitor vital signs and CBGs as ordered, Perform/monitor CIWA, COWS, AIMS and Fall Risk screenings as ordered, Perform wound care treatments as ordered.  Evaluation of Outcomes: Progressing   LCSW Treatment Plan for Primary Diagnosis: Schizoaffective disorder, bipolar type (HCC) Long Term Goal(s): Safe transition to appropriate next level of care at discharge, Engage patient in therapeutic group addressing interpersonal concerns.  Short Term Goals: Engage patient in aftercare planning with referrals and resources, Increase ability to appropriately verbalize  feelings, Identify triggers associated with mental health/substance abuse issues and Increase skills for wellness and recovery  Therapeutic Interventions: Assess for all discharge needs, 1 to 1 time with Social worker, Explore available resources and support systems, Assess for adequacy in community support network, Educate family and significant other(s) on suicide prevention, Complete Psychosocial Assessment, Interpersonal group therapy.  Evaluation of Outcomes: Progressing   Progress in Treatment: Attending groups: Yes. Participating in groups: Yes. Taking medication as prescribed: Yes. Toleration medication: Yes. Family/Significant other contact made: Yes, individual(s) contacted:   husband Patient understands diagnosis: Yes. Discussing patient identified problems/goals with staff: Yes. Medical problems stabilized or resolved: Yes. Denies suicidal/homicidal ideation: Yes. Issues/concerns per patient self-inventory: Yes. Other:    New problem(s) identified: No, Describe:     New Short Term/Long Term Goal(s):  Discharge Plan or Barriers: Home to husband [facility]  Reason for Continuation of Hospitalization: Delusions  Hallucinations Medication stabilization  Estimated Length of Stay:2-3 days  Attendees: Patient: 08/03/2016 5:20 PM  Physician: Ardyth Harpshernandez 08/03/2016 5:20 PM  Nursing: Shelia MediaJanet Jones 08/03/2016 5:20 PM  RN Care Manager: 08/03/2016 5:20 PM  Social Worker: Jake SharkSara Appollonia Klee, LCSW 08/03/2016 5:20 PM  Recreational Therapist: Hershal CoriaBeth Greene, LRT 08/03/2016 5:20 PM  Other:   5:20 PM  Other:   5:20 PM  Other:  5:20 PM    Scribe for Treatment Team: Glennon MacSara P Sulma Ruffino, LCSW 08/03/2016 5:20 PM

## 2016-08-05 NOTE — Progress Notes (Signed)
Inpatient Diabetes Program Recommendations  AACE/ADA: New Consensus Statement on Inpatient Glycemic Control (2015)  Target Ranges:  Prepandial:   less than 140 mg/dL      Peak postprandial:   less than 180 mg/dL (1-2 hours)      Critically ill patients:  140 - 180 mg/dL   Review of Glycemic Control  Diabetes history: DM 2 Outpatient Diabetes medications: Metformin 500 mg BID Current orders for Inpatient glycemic control: Novolog Sensitive Correction + HS scale  Inpatient Diabetes Program Recommendations:   Consider increasing Novolog Correction scale to Novolog Moderate (0-15 units).  Thanks,  Christena DeemShannon Estelle Greenleaf RN, MSN, [facility] Inpatient Diabetes Coordinator Team Pager [phone] (8a-5p)

## 2016-08-05 NOTE — [facility] Group Notes (Signed)
  [facility] LCSW Group Therapy Note  Date/Time: 08/05/16, 1300  Type of Therapy/Topic:  Group Therapy:  Emotion Regulation  Participation Level:  Minimal   Mood: quiet  Description of Group:    The purpose of this group is to assist patients in learning to regulate negative emotions and experience positive emotions. Patients will be guided to discuss ways in which they have been vulnerable to their negative emotions. These vulnerabilities will be juxtaposed with experiences of positive emotions or situations, and patients challenged to use positive emotions to combat negative ones. Special emphasis will be placed on coping with negative emotions in conflict situations, and patients will process healthy conflict resolution skills.  Therapeutic Goals: 1. Patient will identify two positive emotions or experiences to reflect on in order to balance out negative emotions:  2. Patient will label two or more emotions that they find the most difficult to experience:  3. Patient will be able to demonstrate positive conflict resolution skills through discussion or role plays:   Summary of Patient Progress: Pt did not participate in group discussion except when asked direct questions by CSW. Pt did identify anger as the emotion that is difficult for her to experience.       Therapeutic Modalities:   Cognitive Behavioral Therapy Feelings Identification Dialectical Behavioral Therapy  Daleen SquibbGreg Lacara Dunsworth, LCSW

## 2016-08-06 LAB — CBC WITH DIFFERENTIAL/PLATELET
Basophils Absolute: 0.1 10*3/uL (ref 0–0.1)
Basophils Relative: 1 %
EOS ABS: 0.5 10*3/uL (ref 0–0.7)
EOS PCT: 5 %
HCT: 42 % (ref 35.0–47.0)
Hemoglobin: 14.5 g/dL (ref 12.0–16.0)
LYMPHS ABS: 3.3 10*3/uL (ref 1.0–3.6)
LYMPHS PCT: 32 %
MCH: 31.8 pg (ref 26.0–34.0)
MCHC: 34.4 g/dL (ref 32.0–36.0)
MCV: 92.2 fL (ref 80.0–100.0)
MONO ABS: 1 10*3/uL — AB (ref 0.2–0.9)
MONOS PCT: 10 %
Neutro Abs: 5.3 10*3/uL (ref 1.4–6.5)
Neutrophils Relative %: 52 %
PLATELETS: 336 10*3/uL (ref 150–440)
RBC: 4.55 MIL/uL (ref 3.80–5.20)
RDW: 13.5 % (ref 11.5–14.5)
WBC: 10.2 10*3/uL (ref 3.6–11.0)

## 2016-08-06 LAB — CARBAMAZEPINE LEVEL, TOTAL: Carbamazepine Lvl: 8.1 ug/mL (ref 4.0–12.0)

## 2016-08-06 LAB — GLUCOSE, CAPILLARY: GLUCOSE-CAPILLARY: 199 mg/dL — AB (ref 65–99)

## 2016-08-06 MED ORDER — ZIPRASIDONE HCL 80 MG PO CAPS: 80.0000 mg | ORAL_CAPSULE | Freq: Two times a day (BID) | ORAL | 0 refills | Status: DC

## 2016-08-06 MED ORDER — METFORMIN HCL 1000 MG PO TABS: 1000.0000 mg | ORAL_TABLET | Freq: Two times a day (BID) | ORAL | 0 refills | Status: DC

## 2016-08-06 MED ORDER — LORAZEPAM 2 MG PO TABS: 2.0000 mg | ORAL_TABLET | Freq: Every evening | ORAL | 0 refills | Status: DC | PRN

## 2016-08-06 MED ORDER — CARBAMAZEPINE 200 MG PO TABS: 400.0000 mg | ORAL_TABLET | Freq: Two times a day (BID) | ORAL | 0 refills | Status: DC

## 2016-08-06 NOTE — Progress Notes (Signed)
Inpatient Diabetes Program Recommendations  AACE/ADA: New Consensus Statement on Inpatient Glycemic Control (2015)  Target Ranges:  Prepandial:   less than 140 mg/dL      Peak postprandial:   less than 180 mg/dL (1-2 hours)      Critically ill patients:  140 - 180 mg/dL   Results for Rita ChristmasWATSON, Rita K (MRN [id number]) as of 08/06/2016 08:32  Ref. Range 08/05/2016 05:40 08/05/2016 11:26 08/05/2016 17:11 08/05/2016 21:15 08/06/2016 06:43  Glucose-Capillary Latest Ref Range: 65 - 99 mg/dL 409235 (H) 811214 (H) 914235 (H) 217 (H) 199 (H)   Review of Glycemic Control  Diabetes history: DM2 Outpatient Diabetes medications: Metformin 500 mg BID Current orders for Inpatient glycemic control: Metformin 1000 mg BID, Novolog 0-9 units TID with meals, Novolog 0-5 units QHS, Novolog 6 units TID with meals for meal coverage  Inpatient Diabetes Program Recommendations: Insulin - Meal Coverage: Please consider increasing meal coverage to Novolog 8 units TID with meals. A1C: A1C 7.8% on 07/31/16 indicating an average glucose of 177 mg/dl over the past 2-3 months. Please increase outpatient dose of Metformin to 1000 mg BID at time of discharge and have patient follow up with PCP.  Thanks, Orlando PennerMarie Feliz Lincoln, RN, MSN, CDE Diabetes Coordinator Inpatient Diabetes Program [phone] (Team Pager from 8am to 5pm)

## 2016-08-06 NOTE — Plan of Care (Signed)
Problem: Coping: Goal: Ability to verbalize frustrations and anger appropriately will improve Outcome: Progressing Patient verbalized feelings to staff.    

## 2016-08-06 NOTE — [facility] Suicide Risk Assessment (Signed)
[facility]   Principal Problem: Schizoaffective disorder, bipolar type Rita Sullivan(HCC) Discharge Diagnoses:  Patient Active Problem List   Diagnosis Date Noted  . Schizoaffective disorder, bipolar type (HCC) [F25.0] 08/02/2016  . Dyslipidemia [E78.5] 07/31/2016  . Akathisia [G25.71] 07/31/2016  . Vitamin D deficiency [E55.9] 07/31/2016  . Diabetes mellitus type 2, diet-controlled (HCC) [E11.9] 04/11/2015     Psychiatric Specialty Exam: ROS  Blood pressure (!) 119/93, pulse 84, temperature 98.3 F (36.8 C), resp. rate 18, height 5\' 2"  (1.575 m), weight 80.1 kg (176 lb 8 oz), SpO2 100 %.Body mass index is 32.28 kg/m.                                                       Mental Status Per Nursing Assessment::   On Admission:  NA  Demographic Factors:  Caucasian  Loss Factors: Decrease in vocational status  Historical Factors: NA  Risk Reduction Factors:   Religious beliefs about death, Living with another person, especially a relative and Positive social support  No access to guns  Continued Clinical Symptoms:  Previous Psychiatric Diagnoses and Treatments  Cognitive Features That Contribute To Risk:  Loss of executive function    Suicide Risk:  Minimal: No identifiable suicidal ideation.  Patients presenting with no risk factors but with morbid ruminations; may be classified as minimal risk based on the severity of the depressive symptoms  Follow-up Information    [facility]   Why:  Appointment with Dr. Omelia BlackwaterHeaden on this date at 3:40pm. Bring discharge summary and current medications with you to this appointment.  Contact information: [address] [place], [place] [address] [phone] (OFFICE) [phone] Valinda Hoar(FAX)        [facility] on 12/04/2016.   Why:  Appointment with Dr. Omelia BlackwaterHeaden on this date at 2:00pm for medication managment.  Contact  information: [address] [place], [place] [address] [phone] (OFFICE) [phone] Valinda Hoar(FAX)        [facility] Go [facility].   Why:   1:00pm, Earliest available [facility]. Contact information: [address] [place], [place] [address] [phone] (OFFICE) [phone] (FAX)        [facility]. Go to.   Specialty:  Behavioral Health Why[facility]  [facility].  Contact information: [address] [place] [address] [phone]            Rita Sullivan,  Rita Nippert, MD 08/06/2016, 9:12 AM

## 2016-08-06 NOTE — Progress Notes (Signed)
D: Pt denies SI/HI/AVH. Pt is pleasant and cooperative, affect is bright, mod is happy and receptive to nursing staff. Pt appears less anxious and she is interacting with peers and staff appropriately. Patients thoughts are organized no bizarre behavior noted.   A: Pt was offered support and encouragement. Pt was given scheduled medications. Pt was encouraged to attend groups. Q 15 minute checks were done for safety.  R:Pt attends groups and interacts well with peers and staff. Pt is taking medication. Pt has no complaints.Pt receptive to treatment and safety maintained on unit.

## 2016-08-06 NOTE — Progress Notes (Signed)
Recreation Therapy Notes  INPATIENT RECREATION TR PLAN  Patient Details Name: Rita Sullivan MRN: [id number] DOB: 1956-01-31 Today's Date: 08/06/2016  Rec Therapy Plan Is patient appropriate for Therapeutic Recreation?: Yes Treatment times per week: At least once a week TR Treatment/Interventions: 1:1 session, Group participation (Comment) (Appropriate participation in daily recreational therapy tx)  Discharge Criteria Pt will be discharged from therapy if:: Treatment goals are met, Discharged Treatment plan/goals/alternatives discussed and agreed upon by:: Patient/family  Discharge Summary Short term goals set: See Care Plan Short term goals met: Complete Progress toward goals comments: One-to-one attended Which groups?: Coping skills, Leisure education One-to-one attended: Time management, stress management Reason goals not met: N/A Therapeutic equipment acquired: None Reason patient discharged from therapy: Discharge from hospital Pt/family agrees with progress & goals achieved: Yes Date patient discharged from therapy: 08/06/16   Leonette [facility], LRT/CTRS 08/06/2016, 10:55 AM

## 2016-08-06 NOTE — Progress Notes (Signed)
Pt awake, alert, oriented and up on unit today. Reports fair sleep last night, fair appetite, normal energy, good concentration. Rates depression, anxiety, hopelessness 0/10 (low 0-10 high). Denies SI/HI/AVH, pain. Goal today is "anxiety, coping" by "adjust my meds." Pt reports she does not feel ready to discharge, but voices looking forward to discharge as well. Pt would not elaborate on why she did not feel ready for discharge. Pt appears anxious, she is calm and cooperative. Interacts appropriately with staff/peers. Voices understanding of prescribed medications and is medication compliant. Support and encouragement provided. Medications administered with education. Safety maintained with every 15 minute checks. Will continue to monitor.

## 2016-08-06 NOTE — Progress Notes (Signed)
Recreation Therapy Notes  Date: 03.15.18 Time: 9:30 am Location: Craft Room  Group Topic: Leisure Education  Goal Area(s) Addresses:  Patient will identify activities for each letter of the alphabet. Patient will verbalize ability to integrate positive leisure into life post d/c. Patient will verbalize ability to use leisure as a Associate Professorcoping skill.  Behavioral Response: Attentive, Interactive  Intervention: Leisure Alphabet  Activity: Patients were given a Leisure Information systems managerAlphabet worksheet and were instructed to write healthy leisure activities for each letter of the alphabet.  Education: LRT educated patients on what they need to participate in leisure.  Education Outcome: In group clarification offered  Clinical Observations/Feedback: Patient wrote some healthy leisure activities and patient wrote things such as "yellow button". Patient contributed to group discussion by stating some of her leisure activities.  Jacquelynn CreeGreene,Azarion Hove M, LRT/CTRS 08/06/2016 10:08 AM

## 2016-08-06 NOTE — Discharge Summary (Signed)
Physician Discharge Summary Note  Patient:  Rita Sullivan is an 61 y.o., female MRN:  [id number] DOB:  Mar 15, 1956 Patient phone:  [phone] (home)  Patient address:   [address] [place] [address],  Total Time spent with patient: 30 minutes  Date of Admission:  07/30/2016 Date of Discharge: 08/06/16  Reason for Admission:  psychosis  Principal Problem: Schizoaffective disorder, bipolar type [facility]) Discharge Diagnoses: Patient Active Problem List   Diagnosis Date Noted  . Schizoaffective disorder, bipolar type (HCC) [F25.0] 08/02/2016  . Dyslipidemia [E78.5] 07/31/2016  . Akathisia [G25.71] 07/31/2016  . Vitamin D deficiency [E55.9] 07/31/2016  . Diabetes mellitus type 2, diet-controlled (HCC) [E11.9] 04/11/2015    History of Present Illness:  Patient is a 61 year old married Caucasian female from [place]. Patient came to our emergency department voluntarily on March 7 in the company of her husband. The patient complained of feeling "manic". Her husband reported that she had run away from the home earlier in the day.  Patient complained of not being able to think clearly. She stated she had racing thoughts and was unable to concentrate. Some staff in the emergency Department described her as having thought blocking.  Patient carries a diagnosis of bipolar disorder versus a schizoaffective disorder bipolar type. She follows up with Dr.[facility]aden at [facility]. Patient says that for the most part she is being compliant with her medications but thinks that over the last few  days she had missed a few doses.  Patient is currently prescribed with Tegretol, Zonegran, Seroquel and Geodon.  Patient was family limited historian today. She was pleasant and cooperative. She says that for the last couple weeks she has been having significant trouble sleeping. She is only resting about one hour a night. She says she stays up reading books. She also has been hearing  voices of the nature planetary influences---gods talking to her trying to tell her to make changes in the world. She feels that her energy and her mood are elevated. She denies suicidality or homicidality and she also denies having visual hallucinations.  As far as substance abuse he denies having any history of substance use. She does not smoke cigarettes  Trauma history she denies  Associated Signs/Symptoms: Depression Symptoms:  insomnia, psychomotor agitation, (Hypo) Manic Symptoms:  Elevated Mood, Hallucinations, Impulsivity, Anxiety Symptoms:  Excessive Worry, Psychotic Symptoms:  Hallucinations: Auditory PTSD Symptoms: Negative Total Time spent with patient: 1 hour  Past Psychiatric History:  Patient states she is being hospitalized a multitude of time since the age of 89 when she was first diagnosed with bipolar disorder. She says that her last hospitalization was here in 2015. She denies any history of self injury or suicidal attempts   Past Medical History:  Past Medical History:  Diagnosis Date  . Bipolar disorder (HCC)   . Chronic kidney disease   . Diabetes mellitus without complication (HCC)   . Hypercholesteremia   . Manic behavior (HCC)     Past Surgical History:  Procedure Laterality Date  . COLONOSCOPY WITH PROPOFOL N/A 03/11/2015   Procedure: COLONOSCOPY WITH PROPOFOL;  Surgeon: Wallace Cullens, MD;  Location: [facility] END[facility]COPY;  Service: Gastroenterology;  Laterality: N/A;   Family History:  Family History  Problem Relation Age of Onset  . Diabetes Father   . Diabetes Sister    Family Psychiatric  History: Patient denies having any family members with history of suicides, mental illness or substance abuse  Social History: Patient lives with her husband  of 29 years. They have 2 kids together ages 2225 and 3227. Patient is currently disabled due to bipolar disorder. As far as her education she has a bachelor's degree. She denies any legal history or Forensic psychologistmilitary  training. History  Alcohol Use  . 1.2 oz/week  . 2 Glasses of wine per week    Comment: Occ     History  Drug Use No    Social History   Social History  . Marital status: Married    Spouse name: N/A  . Number of children: N/A  . Years of education: N/A   Social History Main Topics  . Smoking status: Never Smoker  . Smokeless tobacco: Never Used  . Alcohol use 1.2 oz/week    2 Glasses of wine per week     Comment: Occ  . Drug use: No  . Sexual activity: Not Asked   Other Topics Concern  . None   Social History Narrative  . None    Hospital Course:   Daily contact with patient to assess and evaluate symptoms and progress in treatment   Some improvement.  Thought process still bradyphrenic. Has significant psychomotor retardation, improved some. No changes in medications today. We will be checking a Tegretol level tomorrow morning. Tegretol level is within the normal limits with plan to discharge her home.  Patient is a 61 year old Caucasian female with bipolar disorder who presented to our emergency department with a manic episode and psychotic features. Triggers for this episode appeared to be noncompliance  Bipolar disorder type I patient will be continued on Tegretol  400 mg twice a day.  Level today was 8.1  Increased Geodon  to 80 mg po bid with meals  Zonegran and Seroquel have been  discontinued  Akathisia: Patient reports prior history of akathisia with Geodon will continue Inderal 20 mg twice a day  Tardive dyskinesia: Patient has significant involuntary movements in her mouth  Insomnia: continue ativan 2 mg po qhs prn  Vitamin D deficiency continue vitamin D 400 units a day and Citra cal daily  Diabetes continue metformin but will increase to 1000 mg po bid.  Also added novolog 6 U tid. Patient is on a carb modified diet. I have place orders for supplemental insulin--- spoke with diabetes coordinator. They stated that when the patient is ready  for discharge the recommendation will be to only discharge on the metformin as hemoglobin A1c is only 7.8  Dyslipidemia continue Crestor 40 mg a day   Labs- hemoglobin A1c 7.8%, lipid panel slight elevation of LDL (101) and TSH wnl  Diet carb modifie  F/Ucontinue to follow-up with Dr. Omelia BlackwaterHEADEN  Records were reviewed from prior hospitalization here in 2015. She was discharged with a diagnosis of bipolar disorder.  This hospitalization was uneventful. The patient did not display any aggression, agitation or disruptive behaviors. She was pleasant and cooperative. Compliant with medications. She did not require seclusion, restraints or forced medication.  Her participation in programming was fair. Patient had limited interaction with peers. She was mostly in her room on her on.  Today she reports improvement. She denies tolerance with mood, appetite, energy, sleep or concentration. She denies suicidality, homicidality or auditory or visual hallucinations. She does not have any concerns about discharge today. She is tolerating medications well. She denies any side effects. She denies any physical complaints.  Staff working with the patient does not voice any concerns about her safety or the safety of others upon her discharge.  Physical Findings:  AIMS: Facial and Oral Movements Muscles of Facial Expression: None, normal Lips and Perioral Area: None, normal Jaw: None, normal Tongue: None, normal,Extremity Movements Upper (arms, wrists, hands, fingers): None, normal Lower (legs, knees, ankles, toes): None, normal, Trunk Movements Neck, shoulders, hips: None, normal, Overall Severity Severity of abnormal movements (highest score from questions above): None, normal Incapacitation due to abnormal movements: None, normal Patient's awareness of abnormal movements (rate only patient's report): No Awareness, Dental Status Current problems with teeth and/or dentures?: No Does patient usually  wear dentures?: No  CIWA:  CIWA-Ar Total: 0 COWS:     Musculoskeletal: Strength & Muscle Tone: within normal limits Gait & Station: normal Patient leans: N/A  Psychiatric Specialty Exam: Physical Exam  Constitutional: She is oriented to person, place, and time. She appears well-developed and well-nourished.  HENT:  Head: Normocephalic and atraumatic.  Eyes: Conjunctivae and EOM are normal.  Neck: Normal range of motion.  Respiratory: Effort normal.  Musculoskeletal: Normal range of motion.  Neurological: She is alert and oriented to person, place, and time.    Review of Systems  Constitutional: Negative.   HENT: Negative.   Eyes: Negative.   Respiratory: Negative.   Cardiovascular: Negative.   Gastrointestinal: Negative.   Genitourinary: Negative.   Musculoskeletal: Negative.   Skin: Negative.   Neurological: Negative.   Endo/Heme/Allergies: Negative.   Psychiatric/Behavioral: Negative.     Blood pressure (!) 119/93, pulse 84, temperature 98.3 F (36.8 C), resp. rate 18, height 5\' 2"  (1.575 m), weight 80.1 kg (176 lb 8 oz), SpO2 100 %.Body mass index is 32.28 kg/m.  General Appearance: Fairly Groomed  Eye Contact:  Good  Speech:  Clear and Coherent  Volume:  Decreased  Mood:  Euthymic  Affect:  Constricted  Thought Process:  Linear and Descriptions of Associations: Intact  Orientation:  Full (Time, Place, and Person)  Thought Content:  Hallucinations: None  Suicidal Thoughts:  No  Homicidal Thoughts:  No  Memory:  Immediate;   Fair Recent;   Fair Remote;   Fair  Judgement:  Fair  Insight:  Fair  Psychomotor Activity:  Decreased  Concentration:  Concentration: Fair and Attention Span: Fair  Recall:  [employer] of Knowledge:  Fair  Language:  Fair  Akathisia:  No  Handed:    AIMS (if indicated):     Assets:  Communication Skills Physical Health  ADL's:  Intact  Cognition:  Impaired,  Mild  Sleep:  Number of Hours: 7.15     Have you used any form of  tobacco in the last 30 days? (Cigarettes, Smokeless Tobacco, Cigars, and/or Pipes): No  Has this patient used any form of tobacco in the last 30 days? (Cigarettes, Smokeless Tobacco, Cigars, and/or Pipes) Yes, No  Blood Alcohol level:  Lab Results  Component Value Date   ETH <5 07/29/2016   ETH 134 (H) 05/14/2016    Metabolic Disorder Labs:  Lab Results  Component Value Date   HGBA1C 7.8 (H) 07/31/2016   MPG 177 07/31/2016   Lab Results  Component Value Date   PROLACTIN 3.6 (L) 07/31/2016   Lab Results  Component Value Date   CHOL 179 07/31/2016   TRIG 136 07/31/2016   HDL 51 07/31/2016   CHOLHDL 3.5 07/31/2016   VLDL 27 07/31/2016   LDLCALC 101 (H) 07/31/2016     Ref. Range 08/06/2016 06:41 08/06/2016 06:43  WBC Latest Ref Range: 3.6 - 11.0 K/uL 10.2   RBC Latest Ref Range: 3.80 - 5.20 MIL/uL 4.55  Hemoglobin Latest Ref Range: 12.0 - 16.0 g/dL 16.1   HCT Latest Ref Range: 35.0 - 47.0 % 42.0   MCV Latest Ref Range: 80.0 - 100.0 fL 92.2   MCH Latest Ref Range: 26.0 - 34.0 pg 31.8   MCHC Latest Ref Range: 32.0 - 36.0 g/dL 09.6   RDW Latest Ref Range: 11.5 - 14.5 % 13.5   Platelets Latest Ref Range: 150 - 440 K/uL 336   Neutrophils Latest Units: % 52   Lymphocytes Latest Units: % 32   Monocytes Relative Latest Units: % 10   Eosinophil Latest Units: % 5   Basophil Latest Units: % 1   NEUT# Latest Ref Range: 1.4 - 6.5 K/uL 5.3   Lymphocyte # Latest Ref Range: 1.0 - 3.6 K/uL 3.3   Monocyte # Latest Ref Range: 0.2 - 0.9 K/uL 1.0 (H)   Eosinophils Absolute Latest Ref Range: 0 - 0.7 K/uL 0.5   Basophils Absolute Latest Ref Range: 0 - 0.1 K/uL 0.1   Carbamazepine Lvl Latest Ref Range: 4.0 - 12.0 ug/mL 8.1       Ref. Range 08/01/2016 06:27  Amphetamines, Ur Screen Latest Ref Range: NONE DETECTED  NONE DETECTED  Barbiturates, Ur Screen Latest Ref Range: NONE DETECTED  NONE DETECTED  Benzodiazepine, Ur Scrn Latest Ref Range: NONE DETECTED  NONE DETECTED  Cocaine  Metabolite,Ur [place] Latest Ref Range: NONE DETECTED  NONE DETECTED  Methadone Scn, Ur Latest Ref Range: NONE DETECTED  NONE DETECTED  MDMA (Ecstasy)Ur Screen Latest Ref Range: NONE DETECTED  NONE DETECTED  Cannabinoid 50 Ng, Ur [place] Latest Ref Range: NONE DETECTED  NONE DETECTED  Opiate, Ur Screen Latest Ref Range: NONE DETECTED  NONE DETECTED  Phencyclidine (PCP) Ur S Latest Ref Range: NONE DETECTED  NONE DETECTED  Tricyclic, Ur Screen Latest Ref Range: NONE DETECTED  NONE DETECTED     Ref. Range 07/31/2016 06:42  Carbamazepine Lvl Latest Ref Range: 4.0 - 12.0 ug/mL 5.3  Prolactin Latest Ref Range: 4.8 - 23.3 ng/mL 3.6 (L)  Hemoglobin A1C Latest Ref Range: 4.8 - 5.6 % 7.8 (H)  TSH Latest Ref Range: 0.350 - 4.500 uIU/mL 2.446   See Psychiatric Specialty Exam and Suicide Risk Assessment completed by Attending Physician prior to discharge.  Discharge destination:  Home  Is patient on multiple antipsychotic therapies at discharge:  No   Has Patient had three or more failed trials of antipsychotic monotherapy by history:  No  Recommended Plan for Multiple Antipsychotic Therapies: NA   Allergies as of 08/06/2016      Reactions   Beef-derived Products    Does not eat beef    Divalproex Sodium    "bad for my liver"   Pork-derived Products    Does not eat pork   Penicillins Rash      Medication List    STOP taking these medications   folic acid 400 MCG tablet Commonly known as:  FOLVITE   QUEtiapine 300 MG tablet Commonly known as:  SEROQUEL   vitamin C 500 MG tablet Commonly known as:  ASCORBIC ACID   zonisamide 100 MG capsule Commonly known as:  ZONEGRAN     TAKE these medications     Indication  aspirin EC 81 MG tablet Take 81 mg by mouth daily.  Indication:  Fever, heart disease   calcium citrate-vitamin D 315-200 MG-UNIT tablet Commonly known as:  CITRACAL+D Take 1 tablet by mouth 2 (two) times daily.  Indication:  Low Amount of Calcium in the Blood  carbamazepine 200 MG tablet Commonly known as:  TEGRETOL Take 2 tablets (400 mg total) by mouth 2 (two) times daily. What changed:  how much to take  Indication:  schizoaffective   cholecalciferol 400 units Tabs tablet Commonly known as:  VITAMIN D Take 400 Units by mouth daily.  Indication:  vit d def   LORazepam 2 MG tablet Commonly known as:  ATIVAN Take 1 tablet (2 mg total) by mouth at bedtime as needed for anxiety.  Indication:  insomnia   metFORMIN 1000 MG tablet Commonly known as:  GLUCOPHAGE Take 1 tablet (1,000 mg total) by mouth 2 (two) times daily with a meal. What changed:  medication strength  how much to take  Indication:  Type 2 Diabetes   propranolol 20 MG tablet Commonly known as:  INDERAL Take 20 mg by mouth 2 (two) times daily.  Indication:  akathisia   rosuvastatin 40 MG tablet Commonly known as:  CRESTOR Take 40 mg by mouth daily.  Indication:  High Amount of Fats in the Blood   ziprasidone 80 MG capsule Commonly known as:  GEODON Take 1 capsule (80 mg total) by mouth 2 (two) times daily with a meal. What changed:  medication strength  how much to take  when to take this  Indication:  schizoaffective      Follow-up Information    [facility]. Go on 09/01/2016.   Why:  Appointment with Dr. Omelia Blackwater on this date at 3:40pm. Bring discharge summary and current medications with you to this appointment.  Contact information: [address] [place], [place] [address] [phone] (OFFICE) [phone] Valinda Hoar)        [facility] Follow up on 12/04/2016.   Why:  Appointment with Dr. Omelia Blackwater on this date at 2:00pm for medication managment.  Contact information: [address] [place], [place] [address] [phone] (OFFICE) [phone] Valinda Hoar)        [facility]. Go on 08/25/2016.   Why:   1:00pm, Earliest available appointment for Counselor in [place] care. Contact information: [address] [place], [place] [address] [phone] (OFFICE) [phone] (FAX)        [facility]. Go to.   Specialty:  Behavioral Health Why:  Follow-up with [facility] as needed for Crisis Services.  Contact information: [address] [place] [address] [phone]          >30 minutes. >50 % of the time was spent in coordination of care  Signed: Jimmy Footman, MD 08/06/2016, 9:14 AM

## 2016-08-06 NOTE — Progress Notes (Signed)
Provided and reviewed discharge paperwork and prescriptions. Verified understanding by use of teach back method. Verbalizes understanding as well. Pt did not have belongings in a pt specific locker. Pt denies SI/HI/AVH. Voices readiness for discharge and that her husband can pick her up and transport her home. Safety maintained with every 15 minute checks. Will continue to monitor until pt's husband arrives to transport her home.

## 2016-08-06 NOTE — BHH Group Notes (Signed)
Goals Group Date/Time: 08/06/2016 9:00 AM Type of Therapy and Topic: Group Therapy: Goals Group: SMART Goals   Participation Level: Moderate  Description of Group:    The purpose of a daily goals group is to assist and guide patients in setting recovery/wellness-related goals. The objective is to set goals as they relate to the crisis in which they were admitted. Patients will be using SMART goal modalities to set measurable goals. Characteristics of realistic goals will be discussed and patients will be assisted in setting and processing how one will reach their goal. Facilitator will also assist patients in applying interventions and coping skills learned in psycho-education groups to the SMART goal and process how one will achieve defined goal.   Therapeutic Goals:   -Patients will develop and document one goal related to or their crisis in which brought them into treatment.  -Patients will be guided by LCSW using SMART goal setting modality in how to set a measurable, attainable, realistic and time sensitive goal.  -Patients will process barriers in reaching goal.  -Patients will process interventions in how to overcome and successful in reaching goal.   Patient's Goal: Socially interact with at least 2 people today rather than staying in my room all day.  Eat my meals entirely.   Therapeutic Modalities:  Motivational Interviewing  Research officer, political partyCognitive Behavioral Therapy  Crisis Intervention Model  SMART goals setting   Daleen SquibbGreg Ife [place], [place]

## 2016-08-06 NOTE — Progress Notes (Signed)
  [facility] :  Will you be returning to the same living situation after discharge:  Yes,  with husband At discharge, do you have transportation home?: Yes,  husband Do you have the ability to pay for your medications: Yes,  patient has insurance  Release of information consent forms completed and in the chart;  Patient's signature needed at discharge.  Patient to Follow up at: Follow-up Information    [facility] Go on 09/01/2016.   Why:  Appointment with Dr. Omelia BlackwaterHeaden on this date at 3:40pm. Bring discharge summary and current medications with you to this appointment.  Contact information: [address] [place], [place] [address] [phone] (OFFICE) [phone] Valinda Hoar(FAX)        [facility] Follow up on 12/04/2016.   Why:  Appointment with Dr. Omelia BlackwaterHeaden on this date at 2:00pm for medication managment.  Contact information: [address] [place], [place] [address] [phone] (OFFICE) [phone] Valinda Hoar(FAX)        [facility] Go on 08/25/2016.   Why:   1:00pm, Earliest available [facility]. Contact information: [address] [place], [place] [address] [phone] (OFFICE) [phone] (FAX)        [facility]. Go to.   Specialty:  Behavioral Health Why:  [facility].  Contact information: [address] [place] [address] [phone]           Next level of care provider has access to [facility] Link:no  Safety Planning and Suicide Prevention discussed: Yes,  with patient and husband.   Have you used any form of tobacco in the last 30 days? (Cigarettes, Smokeless Tobacco, Cigars, and/or Pipes): No  Has patient been referred to the Quitline?: N/A patient is not a smoker  Patient has been referred for addiction treatment: N/A  Joah Patlan G. Garnette CzechSampson MSW, LCSWA 08/06/2016, 10:15 AM

## 2017-02-18 ENCOUNTER — Emergency Department
Admission: EM | Admit: 2017-02-18 | Discharge: 2017-02-19 | Disposition: A | Discharge status: Other Acute Inpatient Hospital | Payer: Medicare HMO | Attending: Emergency Medicine | Admitting: Emergency Medicine

## 2017-02-18 DIAGNOSIS — F250 Schizoaffective disorder, bipolar type: Secondary | ICD-10-CM | POA: Diagnosis not present

## 2017-02-18 DIAGNOSIS — E119 Type 2 diabetes mellitus without complications: Secondary | ICD-10-CM | POA: Diagnosis not present

## 2017-02-18 DIAGNOSIS — Z7982 Long term (current) use of aspirin: Secondary | ICD-10-CM | POA: Diagnosis not present

## 2017-02-18 DIAGNOSIS — N189 Chronic kidney disease, unspecified: Secondary | ICD-10-CM | POA: Diagnosis not present

## 2017-02-18 DIAGNOSIS — Z7984 Long term (current) use of oral hypoglycemic drugs: Secondary | ICD-10-CM | POA: Diagnosis not present

## 2017-02-18 DIAGNOSIS — Z79899 Other long term (current) drug therapy: Secondary | ICD-10-CM | POA: Insufficient documentation

## 2017-02-18 DIAGNOSIS — R4182 Altered mental status, unspecified: Secondary | ICD-10-CM | POA: Diagnosis present

## 2017-02-18 DIAGNOSIS — G2401 Drug induced subacute dyskinesia: Secondary | ICD-10-CM

## 2017-02-18 DIAGNOSIS — F309 Manic episode, unspecified: Secondary | ICD-10-CM | POA: Diagnosis not present

## 2017-02-18 LAB — URINE DRUG SCREEN, QUALITATIVE (ARMC ONLY)
Amphetamines, Ur Screen: NOT DETECTED
BARBITURATES, UR SCREEN: NOT DETECTED
Benzodiazepine, Ur Scrn: NOT DETECTED
CANNABINOID 50 NG, UR ~~LOC~~: NOT DETECTED
COCAINE METABOLITE, UR ~~LOC~~: NOT DETECTED
MDMA (ECSTASY) UR SCREEN: NOT DETECTED
Methadone Scn, Ur: NOT DETECTED
OPIATE, UR SCREEN: NOT DETECTED
Phencyclidine (PCP) Ur S: NOT DETECTED
Tricyclic, Ur Screen: NOT DETECTED

## 2017-02-18 LAB — COMPREHENSIVE METABOLIC PANEL
ALK PHOS: 91 U/L (ref 38–126)
ALT: 17 U/L (ref 14–54)
ANION GAP: 13 (ref 5–15)
AST: 30 U/L (ref 15–41)
Albumin: 4.5 g/dL (ref 3.5–5.0)
BILIRUBIN TOTAL: 0.3 mg/dL (ref 0.3–1.2)
BUN: 18 mg/dL (ref 6–20)
CALCIUM: 9.7 mg/dL (ref 8.9–10.3)
CO2: 19 mmol/L — ABNORMAL LOW (ref 22–32)
Chloride: 103 mmol/L (ref 101–111)
Creatinine, Ser: 0.9 mg/dL (ref 0.44–1.00)
GFR calc Af Amer: >60 mL/min (ref >60)
GLUCOSE: 199 mg/dL — AB (ref 65–99)
POTASSIUM: 4.4 mmol/L (ref 3.5–5.1)
Sodium: 135 mmol/L (ref 135–145)
Total Protein: 7.5 g/dL (ref 6.5–8.1)

## 2017-02-18 LAB — URINALYSIS, COMPLETE (UACMP) WITH MICROSCOPIC
BACTERIA UA: NONE SEEN
Bilirubin Urine: NEGATIVE
GLUCOSE, UA: 150 mg/dL — AB
Hgb urine dipstick: NEGATIVE
KETONES UR: NEGATIVE mg/dL
LEUKOCYTES UA: NEGATIVE
Nitrite: NEGATIVE
PH: 5 (ref 5.0–8.0)
PROTEIN: NEGATIVE mg/dL
Specific Gravity, Urine: 1.004 — ABNORMAL LOW (ref 1.005–1.030)
Squamous Epithelial / LPF: NONE SEEN

## 2017-02-18 LAB — CBC
HEMATOCRIT: 43.1 % (ref 35.0–47.0)
Hemoglobin: 14.7 g/dL (ref 12.0–16.0)
MCH: 31.4 pg (ref 26.0–34.0)
MCHC: 34 g/dL (ref 32.0–36.0)
MCV: 92.4 fL (ref 80.0–100.0)
PLATELETS: 255 10*3/uL (ref 150–440)
RBC: 4.66 MIL/uL (ref 3.80–5.20)
RDW: 13 % (ref 11.5–14.5)
WBC: 9.6 10*3/uL (ref 3.6–11.0)

## 2017-02-18 LAB — TSH: TSH: 3.147 u[IU]/mL (ref 0.350–4.500)

## 2017-02-18 LAB — ETHANOL: Alcohol, Ethyl (B): <10 mg/dL (ref <10)

## 2017-02-18 NOTE — ED Notes (Signed)

## 2017-02-18 NOTE — ED Notes (Signed)
Hourly rounding reveals patient in room. No complaints, stable, in no acute distress. Q15 minute rounds and monitoring via Security Cameras to continue. 

## 2017-02-18 NOTE — ED Notes (Signed)
Pt. Transferred to BHU from ED to room 5 after screening for contraband. Report to include Situation, Background, Assessment and Recommendations from [facility]. Pt. Oriented to unit including Q15 minute rounds as well as the security cameras for their protection. Patient is alert and oriented, warm and dry in no acute distress. Patient denies SI, HI, and AVH. Pt. Encouraged to let me know if needs arise.

## 2017-02-18 NOTE — ED Notes (Signed)
Pt presents with husband, who states pt is manic. Pt states, when asked to verify her name, "that's true, as best I know it." Pt states that she has been hypnotized. Husband states she has not been normal for a while, and that she has hx of driving all over the country without telling him. States she is hearing voices, and it has escalated in last few days, buying clothing and jewelry. Pt alert, calm, cooperative. NAD noted.

## 2017-02-18 NOTE — ED Provider Notes (Signed)
[facility] Emergency Department Provider Note  Time seen: 5:17 PM  I have reviewed the triage vital signs and the nursing notes.   HISTORY  Chief Complaint Altered Mental Status    HPI Rita Sullivan is a 61 y.o. female With a past medical history bipolar, CK D, diabetes, presents to the emergency department with possible mania.according to report the patient's husband states she has had increasing intensity, states she has had this happen in the past when she drove off to a different part of the country. States her medications were recently adjusted during her last hospitalization. Here the patient does appear hyperactive, is grinding her teeth, has somewhat of a rapid speech pattern, but is able to converse and maintain a rational thought process for the most part. Denies any SI or HI. Patient is agreeable to see a psychiatrist voluntarily. Has no medical complaints today.  Past Medical History:  Diagnosis Date  . Bipolar disorder (HCC)   . Chronic kidney disease   . Diabetes mellitus without complication (HCC)   . Hypercholesteremia   . Manic behavior (HCC)     Patient Active Problem List   Diagnosis Date Noted  . Schizoaffective disorder, bipolar type (HCC) 08/02/2016  . Dyslipidemia 07/31/2016  . Akathisia 07/31/2016  . Vitamin D deficiency 07/31/2016  . Diabetes mellitus type 2, diet-controlled (HCC) 04/11/2015    Past Surgical History:  Procedure Laterality Date  . COLONOSCOPY WITH PROPOFOL N/A 03/11/2015   Procedure: COLONOSCOPY WITH PROPOFOL;  Surgeon: Wallace Cullens, MD;  Location: [facility] ENDOSCOPY;  Service: Gastroenterology;  Laterality: N/A;    Prior to Admission medications   Medication Sig Start Date End Date Taking? Authorizing Provider  aspirin EC 81 MG tablet Take 81 mg by mouth daily.    [provider]  calcium citrate-vitamin D (CITRACAL+D) 315-200 MG-UNIT tablet Take 1 tablet by mouth 2 (two) times daily.    [provider]  carbamazepine (TEGRETOL) 200 MG tablet Take 2 tablets (400 mg total) by mouth 2 (two) times daily. 08/06/16   Jimmy Footman, MD  cholecalciferol (VITAMIN D) 400 UNITS TABS tablet Take 400 Units by mouth daily.    [provider]  LORazepam (ATIVAN) 2 MG tablet Take 1 tablet (2 mg total) by mouth at bedtime as needed for anxiety. 08/06/16   Jimmy Footman, MD  metFORMIN (GLUCOPHAGE) 1000 MG tablet Take 1 tablet (1,000 mg total) by mouth 2 (two) times daily with a meal. 08/06/16   Jimmy Footman, MD  propranolol (INDERAL) 20 MG tablet Take 20 mg by mouth 2 (two) times daily.     [provider]  rosuvastatin (CRESTOR) 40 MG tablet Take 40 mg by mouth daily.    [provider]  ziprasidone (GEODON) 80 MG capsule Take 1 capsule (80 mg total) by mouth 2 (two) times daily with a meal. 08/06/16   Jimmy Footman, MD    Allergies  Allergen Reactions  . Beef-Derived Products     Does not eat beef   . Divalproex Sodium     "bad for my liver"  . Pork-Derived Products     Does not eat pork  . Penicillins Rash    Family History  Problem Relation Age of Onset  . Diabetes Father   . Diabetes Sister     Social History Social History  Substance Use Topics  . Smoking status: Never Smoker  . Smokeless tobacco: Never Used  . Alcohol use 1.2 oz/week    2 Glasses  of wine per week     Comment: Occ    Review of Systems Constitutional: Negative for fever. Cardiovascular: Negative for chest pain. Respiratory: Negative for shortness of breath. Gastrointestinal: Negative for abdominal pain, vomiting and diarrhea. Genitourinary: Negative for dysuria All other ROS negative  ____________________________________________   PHYSICAL EXAM:  VITAL SIGNS: ED Triage Vitals [02/18/17 1608]  Enc Vitals Group     BP (!) 144/92     Pulse Rate (!) 108     Resp 18     Temp 98.2 F (36.8 C)     Temp Source Oral      SpO2 96 %     Weight 174 lb (78.9 kg)     Height  (1.575 m)     Head Circumference      Peak Flow      Pain Score      Pain Loc      Pain Edu?      Excl. in GC?     Constitutional: Alert and oriented. patient does have a rapid each pattern, actually grinding her teeth. Eyes: Normal exam ENT   Head: Normocephalic and atraumatic.   Mouth/Throat: Mucous membranes are moist. Cardiovascular: Normal rate, regular rhythm. No murmur Respiratory: Normal respiratory effort without tachypnea nor retractions. Breath sounds are clear  Gastrointestinal: Soft and nontender. No distention.  Musculoskeletal: Nontender with normal range of motion in all extremities. Neurologic:  Normal speech and language. No gross focal neurologic deficits  Skin:  Skin is warm, dry and intact.  Psychiatric: mild hyperactivity/mania. No SI or HI.  ____________________________________________   INITIAL IMPRESSION / ASSESSMENT AND PLAN / ED COURSE  Pertinent labs & imaging results that were available during my care of the patient were reviewed by me and considered in my medical decision making (see chart for details).  patient presents to the emergency department by her husband with concerns of her mania. Has a history of manic episodes in the past. Patient does have a rapid speech pattern but is able to converse and maintain her thought process. Patient denies SI or HI. Does not appear to be a danger to herself or anyone else at this time. Patient is here voluntarily but is agreeable to speak to a psychiatrist even if that means staying overnight tonight. differential at this time would include bipolar, mania, metabolic abnormality, medication reaction. We will check labs including a TSH. We will have the patient evaluated by psychiatry for further recommendations and possible medication adjustments. Currently the patient appears overall well, is able to converse, follow commands, denies SI or HI, does not  meet IVC criteria at this time.  medical workup is been largely nonrevealing. Awaiting psychiatric disposition.  ____________________________________________   FINAL CLINICAL IMPRESSION(S) / ED DIAGNOSES  mania    Minna Antis, MD 02/18/17 2246

## 2017-02-18 NOTE — ED Notes (Signed)
Hourly rounding reveals patient sleeping in room. No complaints, stable, in no acute distress. Q15 minute rounds and monitoring via Security Cameras to continue. 
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
## 2017-02-18 NOTE — ED Notes (Signed)
Pt ambulated to bathroom without difficulty.
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
## 2017-02-18 NOTE — ED Triage Notes (Signed)
Pt presents with manic behavior. Husband states she is increasing in intensity, and he worries about her taking off and driving to different parts of the country, as she has done in the past. Medications were recently adjusted, and he doesn't see that it is doing the job. Said she has been manic since she was last discharged.

## 2017-02-19 ENCOUNTER — Inpatient Hospital Stay
Admission: AD | Admit: 2017-02-19 | Discharge: 2017-02-25 | DRG: 885 | Disposition: A | Discharge status: Home / Self Care | Payer: Medicare HMO | Source: Intra-hospital | Attending: Psychiatry | Admitting: Psychiatry

## 2017-02-19 DIAGNOSIS — N189 Chronic kidney disease, unspecified: Secondary | ICD-10-CM | POA: Diagnosis present

## 2017-02-19 DIAGNOSIS — G2401 Drug induced subacute dyskinesia: Secondary | ICD-10-CM | POA: Diagnosis present

## 2017-02-19 DIAGNOSIS — E1122 Type 2 diabetes mellitus with diabetic chronic kidney disease: Secondary | ICD-10-CM | POA: Diagnosis present

## 2017-02-19 DIAGNOSIS — E7800 Pure hypercholesterolemia, unspecified: Secondary | ICD-10-CM | POA: Diagnosis present

## 2017-02-19 DIAGNOSIS — Z888 Allergy status to other drugs, medicaments and biological substances status: Secondary | ICD-10-CM | POA: Diagnosis not present

## 2017-02-19 DIAGNOSIS — F250 Schizoaffective disorder, bipolar type: Secondary | ICD-10-CM

## 2017-02-19 DIAGNOSIS — Z79899 Other long term (current) drug therapy: Secondary | ICD-10-CM | POA: Diagnosis not present

## 2017-02-19 DIAGNOSIS — F309 Manic episode, unspecified: Secondary | ICD-10-CM | POA: Diagnosis not present

## 2017-02-19 DIAGNOSIS — Z91018 Allergy to other foods: Secondary | ICD-10-CM | POA: Diagnosis not present

## 2017-02-19 DIAGNOSIS — Z880 Allergy status to penicillin: Secondary | ICD-10-CM

## 2017-02-19 DIAGNOSIS — Z7984 Long term (current) use of oral hypoglycemic drugs: Secondary | ICD-10-CM

## 2017-02-19 MED ORDER — METFORMIN HCL 500 MG PO TABS
1000.0000 mg | ORAL_TABLET | Freq: Two times a day (BID) | ORAL | Status: DC
Start: 2017-02-19 — End: 2017-02-19

## 2017-02-19 MED ORDER — METFORMIN HCL 500 MG PO TABS
500.0000 mg | ORAL_TABLET | Freq: Two times a day (BID) | ORAL | Status: DC
  Administered 2017-02-19 – 2017-02-25 (×12): 500 mg via ORAL
  Filled 2017-02-19 (×12): qty 1

## 2017-02-19 MED ORDER — LORAZEPAM 1 MG PO TABS
1.0000 mg | ORAL_TABLET | Freq: Once | ORAL | Status: AC
  Administered 2017-02-19: 1 mg via ORAL
  Filled 2017-02-19: qty 1

## 2017-02-19 MED ORDER — ALUM & MAG HYDROXIDE-SIMETH 200-200-20 MG/5ML PO SUSP: 30.0000 mL | ORAL | Status: DC | PRN

## 2017-02-19 MED ORDER — ACETAMINOPHEN 325 MG PO TABS: 650.0000 mg | ORAL_TABLET | Freq: Four times a day (QID) | ORAL | Status: DC | PRN

## 2017-02-19 MED ORDER — CARBAMAZEPINE 200 MG PO TABS
400.0000 mg | ORAL_TABLET | Freq: Two times a day (BID) | ORAL | Status: DC
  Administered 2017-02-19 – 2017-02-25 (×12): 400 mg via ORAL
  Filled 2017-02-19 (×12): qty 2

## 2017-02-19 MED ORDER — PROPRANOLOL HCL 20 MG PO TABS
20.0000 mg | ORAL_TABLET | Freq: Two times a day (BID) | ORAL | Status: DC
  Administered 2017-02-19 – 2017-02-25 (×12): 20 mg via ORAL
  Filled 2017-02-19 (×12): qty 1

## 2017-02-19 MED ORDER — PROPRANOLOL HCL 10 MG PO TABS
20.0000 mg | ORAL_TABLET | Freq: Two times a day (BID) | ORAL | Status: DC
  Administered 2017-02-19: 20 mg via ORAL
  Filled 2017-02-19: qty 2

## 2017-02-19 MED ORDER — DIAZEPAM 5 MG PO TABS
5.0000 mg | ORAL_TABLET | Freq: Once | ORAL | Status: AC
  Administered 2017-02-19: 5 mg via ORAL
  Filled 2017-02-19: qty 1

## 2017-02-19 MED ORDER — OLANZAPINE 5 MG PO TABS
15.0000 mg | ORAL_TABLET | Freq: Once | ORAL | Status: AC
  Administered 2017-02-19: 19:00:00 15 mg via ORAL
  Filled 2017-02-19: qty 1

## 2017-02-19 MED ORDER — HYDROXYZINE HCL 25 MG PO TABS
50.0000 mg | ORAL_TABLET | Freq: Once | ORAL | Status: AC
  Administered 2017-02-19: 50 mg via ORAL
  Filled 2017-02-19: qty 2

## 2017-02-19 MED ORDER — TRAZODONE HCL 100 MG PO TABS
100.0000 mg | ORAL_TABLET | Freq: Every evening | ORAL | Status: DC | PRN
  Administered 2017-02-19 – 2017-02-24 (×2): 100 mg via ORAL
  Filled 2017-02-19: qty 1

## 2017-02-19 MED ORDER — ZIPRASIDONE HCL 40 MG PO CAPS
80.0000 mg | ORAL_CAPSULE | Freq: Two times a day (BID) | ORAL | Status: DC
  Administered 2017-02-19 – 2017-02-25 (×12): 80 mg via ORAL
  Filled 2017-02-19 (×12): qty 2

## 2017-02-19 MED ORDER — HYDROXYZINE HCL 50 MG PO TABS: 50.0000 mg | ORAL_TABLET | Freq: Three times a day (TID) | ORAL | Status: DC | PRN

## 2017-02-19 MED ORDER — ROSUVASTATIN CALCIUM 20 MG PO TABS
40.0000 mg | ORAL_TABLET | Freq: Every day | ORAL | Status: DC
Start: 2017-02-19 — End: 2017-02-19
  Filled 2017-02-19: qty 2

## 2017-02-19 MED ORDER — ROSUVASTATIN CALCIUM 20 MG PO TABS
40.0000 mg | ORAL_TABLET | Freq: Every day | ORAL | Status: DC
  Administered 2017-02-19 – 2017-02-24 (×6): 40 mg via ORAL
  Filled 2017-02-19 (×6): qty 2

## 2017-02-19 MED ORDER — CARBAMAZEPINE 200 MG PO TABS: 400.0000 mg | ORAL_TABLET | Freq: Two times a day (BID) | ORAL | Status: DC

## 2017-02-19 MED ORDER — MAGNESIUM HYDROXIDE 400 MG/5ML PO SUSP: 30.0000 mL | Freq: Every day | ORAL | Status: DC | PRN

## 2017-02-19 MED ORDER — ZIPRASIDONE HCL 20 MG PO CAPS: 80.0000 mg | ORAL_CAPSULE | Freq: Two times a day (BID) | ORAL | Status: DC

## 2017-02-19 NOTE — ED Notes (Signed)
Report to oncoming shift.

## 2017-02-19 NOTE — ED Notes (Signed)
Patient is resting in bed with eyes open. Patient without any concerns voiced. Patient states she feels better since medication.

## 2017-02-19 NOTE — ED Notes (Signed)
Hourly rounding reveals patient in rest room. No complaints, stable, in no acute distress. Q15 minute rounds and monitoring via Security Cameras to continue. 

## 2017-02-19 NOTE — Consult Note (Signed)
[facility] Consult   Reason for Consult:  Consult for 61 year old woman with chronic mental health problems who came voluntarily to the emergency room last night because of a compensation Referring Physician:  Archie Balboa Patient Identification: Rita Sullivan MRN:  [id number] Principal Diagnosis: Schizoaffective disorder, bipolar type [facility]) Diagnosis:   Patient Active Problem List   Diagnosis Date Noted  . Tardive dyskinesia [G24.01] 02/19/2017  . Schizoaffective disorder, bipolar type ([place]) [F25.0] 08/02/2016  . Dyslipidemia [E78.5] 07/31/2016  . Akathisia [G25.71] 07/31/2016  . Vitamin D deficiency [E55.9] 07/31/2016  . Diabetes mellitus type 2, diet-controlled ([place]) [E11.9] 04/11/2015    Total Time spent with patient: 1 hour  Subjective:   Rita Sullivan is a 61 y.o. female patient admitted with "I was feeling manic".  HPI:  Patient interviewed chart reviewed. Patient known from previous encounters. 61 year old woman with chronic mental health problems came with her husband voluntarily to the emergency room last night. Patient was described as being very disorganized and unable to give much lucid history last night. On interview today the patient tells me that she has been feeling "like I'm having a manic episode". When asked to provide details she gets confused. She does tell me that she has been hearing voices although she can't remember what they were saying. She denies that she is hearing voices today although she looks very distracted and frequently appears to be responding to internal stimuli. Patient can't remember whether she has been compliant with her medicine and doesn't have an awareness of any particular recent stressor. Denies any new medical problems. Denies suicidal or homicidal thoughts. Patient is currently showing profound thought blocking and disorganization.  Social history: Patient lives with her husband. Has adult children who do not live at home.  Patient does not work outside the home. Husband appears to be very supportive.  Medical history: History of diabetes type 2 which is usually managed with oral medication. Patient appears to have a worsening of tardive dyskinesia symptoms. They are pretty pronounced today and significantly worse than I remember them being last time we spoke.  Substance abuse history: Patient denies any recent alcohol or drug use. There is no sign of anything in the drug screen and she does not have a significant past history of substance abuse  Past Psychiatric History: Multiple prior hospitalizations the last one was in March of this year at our hospital. Carries a long-standing diagnosis of schizoaffective disorder. When she decompensates she will usually describe herself as "manic" but her symptoms typically look more like someone with schizophrenia with a lot of negativity thought blocking and withdrawal. There is no history of suicide attempt or violence in the past. She will get disorganized in her behavior and have poor judgment when she gets more psychotic. She is a long-term patient of Dr. Rosine Door  Risk to Self: Suicidal Ideation: No Suicidal Intent: No Is patient at risk for suicide?: No Suicidal Plan?: No Access to Means: No What has been your use of drugs/alcohol within the last 12 months?: Reports of none How many times?: 0 Other Self Harm Risks: Reports of none Triggers for Past Attempts: None known Intentional Self Injurious Behavior: None Risk to Others: Homicidal Ideation: No Thoughts of Harm to Others: No Current Homicidal Intent: No Current Homicidal Plan: No Access to Homicidal Means: No Identified Victim: Reports of none History of harm to others?: No Assessment of Violence: None Noted Violent Behavior Description: Reports of none Does patient have access to weapons?: No Criminal  Charges Pending?: No Does patient have a court date: No Prior Inpatient Therapy: Prior Inpatient Therapy:  Yes Prior Therapy Dates: 07/2016, 03/2015, 07/2013 & 11/2010 Prior Therapy Facilty/Provider(s): [facility] Reason for Treatment: Bipolar Manic Prior Outpatient Therapy: Prior Outpatient Therapy: Yes Prior Therapy Dates: Currently Prior Therapy Facilty/Provider(s): Dr. Rosine Door Reason for Treatment: Bipolar Does patient have an ACCT team?: No Does patient have Intensive In-House Services?  : No Does patient have [facility] services? : No Does patient have P4CC services?: No  Past Medical History:  Past Medical History:  Diagnosis Date  . Bipolar disorder ([place])   . Chronic kidney disease   . Diabetes mellitus without complication ([place])   . Hypercholesteremia   . Manic behavior ([place])     Past Surgical History:  Procedure Laterality Date  . COLONOSCOPY WITH PROPOFOL N/A 03/11/2015   Procedure: COLONOSCOPY WITH PROPOFOL;  Surgeon: Hulen Luster, MD;  Location: [facility] ENDOSCOPY;  Service: Gastroenterology;  Laterality: N/A;   Family History:  Family History  Problem Relation Age of Onset  . Diabetes Father   . Diabetes Sister    Family Psychiatric  History: Does not know of any family history of mental illness Social History:  History  Alcohol Use  . 1.2 oz/week  . 2 Glasses of wine per week    Comment: Occ     History  Drug Use No    Social History   Social History  . Marital status: Married    Spouse name: N/A  . Number of children: N/A  . Years of education: N/A   Social History Main Topics  . Smoking status: Never Smoker  . Smokeless tobacco: Never Used  . Alcohol use 1.2 oz/week    2 Glasses of wine per week     Comment: Occ  . Drug use: No  . Sexual activity: Not on file   Other Topics Concern  . Not on file   Social History Narrative  . No narrative on file   Additional Social History:    Allergies:   Allergies  Allergen Reactions  . Beef-Derived Products     Does not eat beef   . Divalproex Sodium     "bad for my liver"  . Pork-Derived Products      Does not eat pork  . Penicillins Rash    Labs:  Results for orders placed or performed during the hospital encounter of 02/18/17 (from the past 48 hour(s))  Comprehensive metabolic panel     Status: Abnormal   Collection Time: 02/18/17  4:12 PM  Result Value Ref Range   Sodium 135 135 - 145 mmol/L   Potassium 4.4 3.5 - 5.1 mmol/L   Chloride 103 101 - 111 mmol/L   CO2 19 (L) 22 - 32 mmol/L   Glucose, Bld 199 (H) 65 - 99 mg/dL   BUN 18 6 - 20 mg/dL   Creatinine, Ser 0.90 0.44 - 1.00 mg/dL   Calcium 9.7 8.9 - 10.3 mg/dL   Total Protein 7.5 6.5 - 8.1 g/dL   Albumin 4.5 3.5 - 5.0 g/dL   AST 30 15 - 41 U/L   ALT 17 14 - 54 U/L   Alkaline Phosphatase 91 38 - 126 U/L   Total Bilirubin 0.3 0.3 - 1.2 mg/dL   GFR calc non Af Amer >60 >60 mL/min   GFR calc Af Amer >60 >60 mL/min    Comment: (NOTE) The eGFR has been calculated using the CKD EPI equation. This calculation has not  been validated in all clinical situations. eGFR's persistently <60 mL/min signify possible Chronic Kidney Disease.    Anion gap 13 5 - 15  Ethanol     Status: None   Collection Time: 02/18/17  4:12 PM  Result Value Ref Range   Alcohol, Ethyl (B) <10 <10 mg/dL    Comment:        LOWEST DETECTABLE LIMIT FOR SERUM ALCOHOL IS 10 mg/dL FOR MEDICAL PURPOSES ONLY Please note change in reference range.   TSH     Status: None   Collection Time: 02/18/17  4:12 PM  Result Value Ref Range   TSH 3.147 0.350 - 4.500 uIU/mL    Comment: Performed by a 3rd Generation assay with a functional sensitivity of <=0.01 uIU/mL.  Urine Drug Screen, Qualitative     Status: None   Collection Time: 02/18/17  5:56 PM  Result Value Ref Range   Tricyclic, Ur Screen NONE DETECTED NONE DETECTED   Amphetamines, Ur Screen NONE DETECTED NONE DETECTED   MDMA (Ecstasy)Ur Screen NONE DETECTED NONE DETECTED   Cocaine Metabolite,Ur [place] NONE DETECTED NONE DETECTED   Opiate, Ur Screen NONE DETECTED NONE DETECTED   Phencyclidine (PCP) Ur S NONE  DETECTED NONE DETECTED   Cannabinoid 50 Ng, Ur [place] NONE DETECTED NONE DETECTED   Barbiturates, Ur Screen NONE DETECTED NONE DETECTED   Benzodiazepine, Ur Scrn NONE DETECTED NONE DETECTED   Methadone Scn, Ur NONE DETECTED NONE DETECTED    Comment: (NOTE) 742  Tricyclics, urine               Cutoff 1000 ng/mL 200  Amphetamines, urine             Cutoff 1000 ng/mL 300  MDMA (Ecstasy), urine           Cutoff 500 ng/mL 400  Cocaine Metabolite, urine       Cutoff 300 ng/mL 500  Opiate, urine                   Cutoff 300 ng/mL 600  Phencyclidine (PCP), urine      Cutoff 25 ng/mL 700  Cannabinoid, urine              Cutoff 50 ng/mL 800  Barbiturates, urine             Cutoff 200 ng/mL 900  Benzodiazepine, urine           Cutoff 200 ng/mL 1000 Methadone, urine                Cutoff 300 ng/mL 1100 1200 The urine drug screen provides only a preliminary, unconfirmed 1300 analytical test result and should not be used for non-medical 1400 purposes. Clinical consideration and professional judgment should 1500 be applied to any positive drug screen result due to possible 1600 interfering substances. A more specific alternate chemical method 1700 must be used in order to obtain a confirmed analytical result.  1800 Gas chromato graphy / mass spectrometry (GC/MS) is the preferred 1900 confirmatory method.   Urinalysis, Complete w Microscopic     Status: Abnormal   Collection Time: 02/18/17  5:56 PM  Result Value Ref Range   Color, Urine COLORLESS (A) YELLOW   APPearance CLEAR (A) CLEAR   Specific Gravity, Urine 1.004 (L) 1.005 - 1.030   pH 5.0 5.0 - 8.0   Glucose, UA 150 (A) NEGATIVE mg/dL   Hgb urine dipstick NEGATIVE NEGATIVE   Bilirubin Urine NEGATIVE NEGATIVE   Ketones, ur NEGATIVE NEGATIVE  mg/dL   Protein, ur NEGATIVE NEGATIVE mg/dL   Nitrite NEGATIVE NEGATIVE   Leukocytes, UA NEGATIVE NEGATIVE   RBC / HPF 0-5 0 - 5 RBC/hpf   WBC, UA 0-5 0 - 5 WBC/hpf   Bacteria, UA NONE SEEN NONE SEEN    Squamous Epithelial / LPF NONE SEEN NONE SEEN  CBC     Status: None   Collection Time: 02/18/17  5:57 PM  Result Value Ref Range   WBC 9.6 3.6 - 11.0 K/uL   RBC 4.66 3.80 - 5.20 MIL/uL   Hemoglobin 14.7 12.0 - 16.0 g/dL   HCT 43.1 35.0 - 47.0 %   MCV 92.4 80.0 - 100.0 fL   MCH 31.4 26.0 - 34.0 pg   MCHC 34.0 32.0 - 36.0 g/dL   RDW 13.0 11.5 - 14.5 %   Platelets 255 150 - 440 K/uL    No current facility-administered medications for this encounter.    Current Outpatient Prescriptions  Medication Sig Dispense Refill  . carbamazepine (TEGRETOL) 200 MG tablet Take 2 tablets (400 mg total) by mouth 2 (two) times daily. 120 tablet 0  . LORazepam (ATIVAN) 2 MG tablet Take 1 tablet (2 mg total) by mouth at bedtime as needed for anxiety. 15 tablet 0  . metFORMIN (GLUCOPHAGE) 1000 MG tablet Take 1 tablet (1,000 mg total) by mouth 2 (two) times daily with a meal. 60 tablet 0  . propranolol (INDERAL) 20 MG tablet Take 20 mg by mouth 2 (two) times daily.     . rosuvastatin (CRESTOR) 40 MG tablet Take 40 mg by mouth daily.    . ziprasidone (GEODON) 80 MG capsule Take 1 capsule (80 mg total) by mouth 2 (two) times daily with a meal. (Patient not taking: Reported on 02/19/2017) 60 capsule 0    Musculoskeletal: Strength & Muscle Tone: within normal limits Gait & Station: normal Patient leans: N/A  Psychiatric Specialty Exam: Physical Exam  Nursing note and vitals reviewed. Constitutional: She appears well-developed and well-nourished.    HENT:  Head: Normocephalic and atraumatic.  Eyes: Pupils are equal, round, and reactive to light. Conjunctivae are normal.  Neck: Normal range of motion.  Cardiovascular: Regular rhythm and normal heart sounds.   Respiratory: Effort normal. No respiratory distress.  GI: Soft.  Musculoskeletal: Normal range of motion.  Neurological: She is alert.  Patient has involuntary grimacing and movements of her face as well as her hands.  Skin: Skin is warm and  dry.  Psychiatric: Her affect is blunt. Her speech is delayed. She is slowed and withdrawn. Cognition and memory are impaired. She expresses inappropriate judgment. She expresses no homicidal and no suicidal ideation.    Review of Systems  Constitutional: Negative.   HENT: Negative.   Eyes: Negative.   Respiratory: Negative.   Cardiovascular: Negative.   Gastrointestinal: Negative.   Musculoskeletal: Negative.   Skin: Negative.   Neurological: Negative.   Psychiatric/Behavioral: Positive for hallucinations and memory loss. Negative for depression, substance abuse and suicidal ideas. The patient is nervous/anxious. The patient does not have insomnia.     Blood pressure 114/62, pulse (!) 102, temperature 98.2 F (36.8 C), temperature source Oral, resp. rate 20, height 5' 2"  (1.575 m), weight 78.9 kg (174 lb), SpO2 100 %.Body mass index is 31.83 kg/m.  General Appearance: Disheveled  Eye Contact:  Minimal  Speech:  Garbled and Slow  Volume:  Decreased  Mood:  Dysphoric  Affect:  Blunt, Constricted and Inappropriate  Thought Process:  Disorganized  Orientation:  Other:  Patient told me that she does not know where she is although her behavior would suggest that she does. She told me at first that she had no idea what year it was but then guessed it correctly.  Thought Content:  Illogical and Currently she denies to me having hallucinations although she looks like she is responding to internal stimuli. She has a lot of thought blocking. She admits that she is not sure what her thoughts currently are and that she feels confused.  Suicidal Thoughts:  No  Homicidal Thoughts:  No  Memory:  Immediate;   Fair Recent;   Poor Remote;   Fair  Judgement:  Fair  Insight:  Fair  Psychomotor Activity:  Decreased, Psychomotor Retardation and TD  Concentration:  Concentration: Poor  Recall:  [employer] of Knowledge:  Fair  Language:  Fair  Akathisia:  No  Handed:  Right  AIMS (if indicated):       Assets:  Desire for Improvement Housing Resilience Social Support  ADL's:  Impaired  Cognition:  Impaired,  Mild  Sleep:        Treatment Plan Summary: Daily contact with patient to assess and evaluate symptoms and progress in treatment, Medication management and Plan 61 year old woman with chronic psychotic disorder who is currently showing a lot of symptoms of psychosis and appears to of decompensated once again. Unclear if there was any medicine noncompliance or other stressor. Patient needs to be admitted to the hospital. She is currently voluntary but I believe I will probably go ahead and filed papers to expiate the process of admission. Medicines will be reviewed and we will start with getting her back to what she is supposed to be taking outpatient although she may need some further adjustment. Case reviewed with emergency room physician and TTS. Blood sugar currently is adequate. We will just continue her outpatient medicines  Disposition: Recommend psychiatric Inpatient admission when medically cleared. Supportive therapy provided about ongoing stressors.  Alethia Berthold, MD 02/19/2017 1:15 PM

## 2017-02-19 NOTE — ED Notes (Signed)
Patient observe pacing in room. Patient states she is her because of recent medication change and she is manic. Patient states she feels better than when she came in. Patient alert and oriented. Patient denies SI/HI. Patient denies A/V/H. Q 15 minute checks in progress and maintained. Patient provided support and encouragement.  Patient remains safe on unit. Monitoring of patient continues.

## 2017-02-19 NOTE — BH Assessment (Signed)
Patient is to be admitted to [facility] by Dr. Toni Amend.  Attending Physician will be Dr. Jennet Maduro.   Patient has been assigned to room 307, by [facility] Charge Nurse [place].   Intake Paper Work has been signed and placed on patient chart.  ER staff is aware of the admission Lewie Loron, ER Sect.; Dr. Marisa Severin , ER MD; Britta Mccreedy, Patient's Nurse & Birdena Crandall, Patient Access).

## 2017-02-19 NOTE — ED Notes (Signed)
Patient discharged to unit to room 307. Patient informed of admission and voices no concerns. Patient transported to unit via wheelchair with [place] police and staff. Patient alert and oriented. Report called to [facility] RN receiving nurse on unit. All patient belongings returned and transported with her to unit.

## 2017-02-19 NOTE — Progress Notes (Signed)
Patient becoming more agitated with auditory hallucinations at this time.  Dr. Toni Amend notified and an order for Zyprexa 15 mg and Ativan 1 mg. Medications administered and will be monitored for effectiveness and safety.

## 2017-02-19 NOTE — Progress Notes (Signed)
61 year old female admitted to unit. Denies SI/HI but is responding to internal stimuli. Patient presents with thought blocking and tardive dyskinesia. Patient will not respond to nurse immediately, there is a delay in response to simple questions. Patient with sad affect, denies any pain. Oriented patient to room and unit. Skin and contraband search completed and witnessed by Juanna Cao., RN and Lorene Dy, RN. Skin warm, dry and intact. No contraband found on patient nor her belongings. Admission assessment completed, fluid and nutrition offered. Patient remains safe on the unit with q 15 minute checks.

## 2017-02-19 NOTE — ED Notes (Signed)
Patient in dayroom visiting with spouse.

## 2017-02-19 NOTE — ED Notes (Signed)
Patient in room talking with doctor [place].

## 2017-02-19 NOTE — ED Notes (Signed)
Hourly rounding reveals patient in room. No complaints, stable, in no acute distress. Q15 minute rounds and monitoring via Security Cameras to continue. 

## 2017-02-19 NOTE — ED Notes (Signed)
Hourly rounding reveals patient sleeping in room. No complaints, stable, in no acute distress. Q15 minute rounds and monitoring via Security Cameras to continue. 

## 2017-02-19 NOTE — ED Notes (Addendum)
Hourly rounding reveals patient in rest room. No complaints, stable, in no acute distress. Q15 minute rounds and monitoring via [employer] to continue. 

## 2017-02-19 NOTE — ED Notes (Signed)
Patient noted pacing hall from room to day room. Patient has been pacing since breakfast. Patient pleasant and cooperative. Patient states she is anxious.  MD. Marisa Severin notified and new order for valium 5 mg. Will monitor for results of medication.

## 2017-02-19 NOTE — ED Notes (Signed)
Pt. Anxious and c/o insomnia.

## 2017-02-19 NOTE — Tx Team (Addendum)
Initial Treatment Plan 02/19/2017 3:52 PM Rita Sullivan [id number]    PATIENT STRESSORS: Health problems Medication change or noncompliance   PATIENT STRENGTHS: Average or above average intelligence Religious Affiliation   PATIENT IDENTIFIED PROBLEMS: Tardive Dykinesia  Diabetes                   DISCHARGE CRITERIA:  Improved stabilization in mood, thinking, and/or behavior Verbal commitment to aftercare and medication compliance  PRELIMINARY DISCHARGE PLAN: Outpatient therapy Return to previous living arrangement  PATIENT/FAMILY INVOLVEMENT: This treatment plan has been presented to and reviewed with the patient, Rita Sullivan, and/or family member.  The patient and family have been given the opportunity to ask questions and make suggestions.  Jim Desanctis [name], RN 02/19/2017, 3:52 PM

## 2017-02-20 DIAGNOSIS — F250 Schizoaffective disorder, bipolar type: Principal | ICD-10-CM

## 2017-02-20 LAB — LIPID PANEL
CHOL/HDL RATIO: 3.5 ratio
CHOLESTEROL: 220 mg/dL — AB (ref 0–200)
HDL: 63 mg/dL (ref >40)
LDL Cholesterol: 131 mg/dL — ABNORMAL HIGH (ref 0–99)
Triglycerides: 132 mg/dL (ref <150)
VLDL: 26 mg/dL (ref 0–40)

## 2017-02-20 LAB — GLUCOSE, CAPILLARY
GLUCOSE-CAPILLARY: 156 mg/dL — AB (ref 65–99)
GLUCOSE-CAPILLARY: 113 mg/dL — AB (ref 65–99)

## 2017-02-20 LAB — TSH: TSH: 3.063 u[IU]/mL (ref 0.350–4.500)

## 2017-02-20 LAB — HEMOGLOBIN A1C
Hgb A1c MFr Bld: 6.3 % — ABNORMAL HIGH (ref 4.8–5.6)
MEAN PLASMA GLUCOSE: 134.11 mg/dL

## 2017-02-20 MED ORDER — LORAZEPAM 2 MG PO TABS: 2.0000 mg | ORAL_TABLET | ORAL | Status: DC | PRN

## 2017-02-20 MED ORDER — INSULIN ASPART 100 UNIT/ML ~~LOC~~ SOLN
0.0000 [IU] | Freq: Three times a day (TID) | SUBCUTANEOUS | Status: DC
  Administered 2017-02-20 – 2017-02-21 (×2): 3 [IU] via SUBCUTANEOUS
  Administered 2017-02-21: 2 [IU] via SUBCUTANEOUS
  Administered 2017-02-22: 3 [IU] via SUBCUTANEOUS
  Administered 2017-02-22: 5 [IU] via SUBCUTANEOUS
  Administered 2017-02-23: 2 [IU] via SUBCUTANEOUS
  Administered 2017-02-23 (×2): 3 [IU] via SUBCUTANEOUS
  Administered 2017-02-24: 2 [IU] via SUBCUTANEOUS
  Administered 2017-02-24: 5 [IU] via SUBCUTANEOUS
  Administered 2017-02-24 – 2017-02-25 (×2): 3 [IU] via SUBCUTANEOUS
  Administered 2017-02-25: 8 [IU] via SUBCUTANEOUS
  Filled 2017-02-20 (×9): qty 1

## 2017-02-20 NOTE — Progress Notes (Signed)
Pt refused to have EKG done. RN's at bedside.

## 2017-02-20 NOTE — BHH Suicide Risk Assessment (Signed)
[facility] Admission Suicide Risk Assessment   Nursing information obtained from:   review of nursing notes Demographic factors:   supportive family. Female. Not abusing substances Current Mental Status:   disorganized confused somewhat psychotic but not threatening or agitated or paranoid in the sense of being frightened Loss Factors:   no recent losses Historical Factors:   long-standing mental health problems but no history of suicide attempts Risk Reduction Factors:   positive support from family  Total Time spent with patient: 1 hour Principal Problem: Schizoaffective disorder, bipolar type ([facility]) Diagnosis:   Patient Active Problem List   Diagnosis Date Noted  . Tardive dyskinesia [G24.01] 02/19/2017  . Schizoaffective disorder, bipolar type ([facility]) [F25.0] 08/02/2016  . Dyslipidemia [E78.5] 07/31/2016  . Akathisia [G25.71] 07/31/2016  . Vitamin D deficiency [E55.9] 07/31/2016  . Diabetes mellitus type 2, diet-controlled ([facility]) [E11.9] 04/11/2015   Subjective Data: patient is denying any suicidal ideation denies homicidal ideation. She shows good insight into her illness. Still having some hallucinations and feeling confused and "manic". No specific physical complaints at this time.  Continued Clinical Symptoms:  Alcohol Use Disorder Identification Test Final Score (AUDIT): 1 The "Alcohol Use Disorders Identification Test", Guidelines for Use in Primary Care, Second Edition.  World Science writer [facility]). Score between 0-7:  no or low risk or alcohol related problems. Score between 8-15:  moderate risk of alcohol related problems. Score between 16-19:  high risk of alcohol related problems. Score 20 or above:  warrants further diagnostic evaluation for alcohol dependence and treatment.   CLINICAL FACTORS:   Bipolar Disorder:   Mixed State   Musculoskeletal: Strength & Muscle Tone: within normal limits Gait & Station: normal Patient leans: N/A  Psychiatric Specialty  Exam: Physical Exam  Nursing note and vitals reviewed. Constitutional: She appears well-developed and well-nourished.  HENT:  Head: Normocephalic and atraumatic.  Eyes: Pupils are equal, round, and reactive to light. Conjunctivae are normal.  Neck: Normal range of motion.  Cardiovascular: Normal heart sounds.   Respiratory: Effort normal.  GI: Soft.  Musculoskeletal: Normal range of motion.  Neurological: She is alert.  Appears to have some degree of tardive dyskinesia  Skin: Skin is warm and dry.  Psychiatric: Her affect is blunt. Her speech is tangential. She is slowed. Thought content is not paranoid. She expresses impulsivity. She expresses no homicidal and no suicidal ideation. She is noncommunicative. She exhibits abnormal recent memory.    Review of Systems  Constitutional: Negative.   HENT: Negative.   Eyes: Negative.   Respiratory: Negative.   Cardiovascular: Negative.   Gastrointestinal: Negative.   Musculoskeletal: Negative.   Skin: Negative.   Neurological: Negative.   Psychiatric/Behavioral: Positive for hallucinations. Negative for depression, memory loss, substance abuse and suicidal ideas. The patient is nervous/anxious and has insomnia.     Blood pressure (!) 134/96, pulse 91, temperature 98.8 F (37.1 C), resp. rate 18.There is no height or weight on file to calculate BMI.  General Appearance: Casual  Eye Contact:  Fair  Speech:  Slow  Volume:  Decreased  Mood:  Euthymic  Affect:  Constricted  Thought Process:  Disorganized  Orientation:  Full (Time, Place, and Person)  Thought Content:  Rumination and Tangential  Suicidal Thoughts:  No  Homicidal Thoughts:  No  Memory:  Immediate;   Fair Recent;   Fair Remote;   Fair  Judgement:  Fair  Insight:  Fair  Psychomotor Activity:  Decreased  Concentration:  Concentration: Fair  Recall:  [employer]  of Knowledge:  Fair  Language:  Fair  Akathisia:  No  Handed:  Right  AIMS (if indicated):     Assets:   Desire for Improvement Housing Physical Health Resilience Social Support  ADL's:  Intact  Cognition:  Impaired,  Mild  Sleep:  Number of Hours: 6      COGNITIVE FEATURES THAT CONTRIBUTE TO RISK:  Loss of executive function    SUICIDE RISK:   Minimal: No identifiable suicidal ideation.  Patients presenting with no risk factors but with morbid ruminations; may be classified as minimal risk based on the severity of the depressive symptoms  PLAN OF CARE: patient admitted to the psychiatric unit for stabilization of psychosis. 15 minute checks. Continue current medicine. Make sure that patient is not acutely dangerous at discharge.  I certify that inpatient services furnished can reasonably be expected to improve the patient's condition.   Mordecai Rasmussen, MD 02/20/2017, 3:33 PM

## 2017-02-20 NOTE — Progress Notes (Signed)
Pt is very isolative, stays in room all shift. Pt is cooperative. Pt in a pleasant mood. Denies depression, SI, HI or A/V hallucinations. Pt contracted to safety. 15 min safety checks continues.

## 2017-02-20 NOTE — H&P (Signed)
Psychiatric Admission Assessment Adult  Patient Identification: Rita Sullivan MRN:  [id number] Date of Evaluation:  02/20/2017 Chief Complaint:  Bipolar Principal Diagnosis: Schizoaffective disorder, bipolar type [facility]) Diagnosis:   Patient Active Problem List   Diagnosis Date Noted  . Tardive dyskinesia [G24.01] 02/19/2017  . Schizoaffective disorder, bipolar type (HCC) [F25.0] 08/02/2016  . Dyslipidemia [E78.5] 07/31/2016  . Akathisia [G25.71] 07/31/2016  . Vitamin D deficiency [E55.9] 07/31/2016  . Diabetes mellitus type 2, diet-controlled (HCC) [E11.9] 04/11/2015   History of Present Illness: patient presented to the emergency room here because of several days of what she calls "mania". She was feelin and disorganized. Having some hallucinations. Feeling paranoid. Denies having any suicidal thoughts or intention or homicidal ideation. She says she had been compliant with her medicine. She admits that she might not take her Geodon with food every time. She does not report any particular new stressor and is not abusing any drugs. Associated Signs/Symptoms: Depression Symptoms:  psychomotor retardation, (Hypo) Manic Symptoms:  Distractibility, Anxiety Symptoms:  Agoraphobia, Psychotic Symptoms:  Delusions, Hallucinations: Auditory PTSD Symptoms: Negative Total Time spent with patient: 1 hour  Past Psychiatric History: long-standing mental health problems diagnosed as schizoaffective disorder. Patient will frequently present with what she calls "mania" although a lot of the time the symptoms look more disliked decompensated schizophrenia. She is on Tegretol and Geodon chronically. Says that she has been compliant with medicine. Sees Dr. Omelia Blackwater apatient. [place] any past history of suicide attempts or violence.  Is the patient at risk to self? No.  Has the patient been a risk to self in the past 6 months? No.  Has the patient been a risk to self within the distant past? No.  Is the  patient a risk to others? No.  Has the patient been a risk to others in the past 6 months? No.  Has the patient been a risk to others within the distant past? No.   Prior Inpatient Therapy:  prior inpatient treatment here and at other hospitals. Prior Outpatient Therapy:  long-term medication management  Alcohol Screening: 1. How often do you have a drink containing alcohol?: Monthly or less 2. How many drinks containing alcohol do you have on a typical day when you are drinking?: 1 or 2 3. How often do you have six or more drinks on one occasion?: Never Preliminary Score: 0 9. Have you or someone else been injured as a result of your drinking?: No 10. Has a relative or friend or a doctor or another health worker been concerned about your drinking or suggested you cut down?: No Alcohol Use Disorder Identification Test Final Score (AUDIT): 1 Brief Intervention: AUDIT score less than 7 or less-screening does not suggest unhealthy drinking-brief intervention not indicated Substance Abuse History in the last 12 months:  No. Consequences of Substance Abuse: Negative Previous Psychotropic Medications: Yes  Psychological Evaluations: Yes  Past Medical History:  Past Medical History:  Diagnosis Date  . Bipolar disorder (HCC)   . Chronic kidney disease   . Diabetes mellitus without complication (HCC)   . Hypercholesteremia   . Manic behavior (HCC)     Past Surgical History:  Procedure Laterality Date  . COLONOSCOPY WITH PROPOFOL N/A 03/11/2015   Procedure: COLONOSCOPY WITH PROPOFOL;  Surgeon: Wallace Cullens, MD;  Location: [facility] ENDOSCOPY;  Service: Gastroenterology;  Laterality: N/A;   Family History:  Family History  Problem Relation Age of Onset  . Diabetes Father   . Diabetes Sister    Family  Psychiatric  History: does not know of family history Tobacco Screening: Have you used any form of tobacco in the last 30 days? (Cigarettes, Smokeless Tobacco, Cigars, and/or Pipes): No Social  History:  History  Alcohol Use  . 1.2 oz/week  . 2 Glasses of wine per week    Comment: Occ     History  Drug Use No    Additional Social History:      History of alcohol / drug use?: Yes Negative Consequences of Use: Personal relationships Withdrawal Symptoms:  (none)                    Allergies:   Allergies  Allergen Reactions  . Beef-Derived Products     Does not eat beef  Update: Pt states that she does eat beef  . Divalproex Sodium     "bad for my liver"  . Pork-Derived Products     Does not eat pork Patient states that she does eat pork  . Penicillins Rash   Lab Results:  Results for orders placed or performed during the hospital encounter of 02/19/17 (from the past 48 hour(s))  Lipid panel     Status: Abnormal   Collection Time: 02/20/17  6:44 AM  Result Value Ref Range   Cholesterol 220 (H) 0 - 200 mg/dL   Triglycerides 161 <096 mg/dL   HDL 63 >04 mg/dL   Total CHOL/HDL Ratio 3.5 RATIO   VLDL 26 0 - 40 mg/dL   LDL Cholesterol 540 (H) 0 - 99 mg/dL    Comment:        Total Cholesterol/HDL:CHD Risk Coronary Heart Disease Risk Table                     Men   Women  1/2 Average Risk   3.4   3.3  Average Risk       5.0   4.4  2 X Average Risk   9.6   7.1  3 X Average Risk  23.4   11.0        Use the calculated Patient Ratio above and the CHD Risk Table to determine the patient's CHD Risk.        ATP III CLASSIFICATION (LDL):  <100     mg/dL   Optimal  981-191  mg/dL   Near or Above                    Optimal  130-159  mg/dL   Borderline  478-295  mg/dL   High  >621     mg/dL   Very High   TSH     Status: None   Collection Time: 02/20/17  6:44 AM  Result Value Ref Range   TSH 3.063 0.350 - 4.500 uIU/mL    Comment: Performed by a 3rd Generation assay with a functional sensitivity of <=0.01 uIU/mL.    Blood Alcohol level:  Lab Results  Component Value Date   ETH <10 02/18/2017   ETH <5 07/29/2016    Metabolic Disorder Labs:  Lab  Results  Component Value Date   HGBA1C 7.8 (H) 07/31/2016   MPG 177 07/31/2016   Lab Results  Component Value Date   PROLACTIN 3.6 (L) 07/31/2016   Lab Results  Component Value Date   CHOL 220 (H) 02/20/2017   TRIG 132 02/20/2017   HDL 63 02/20/2017   CHOLHDL 3.5 02/20/2017   VLDL 26 02/20/2017   LDLCALC 131 (H) 02/20/2017  LDLCALC 101 (H) 07/31/2016    Current Medications: Current Facility-Administered Medications  Medication Dose Route Frequency Provider Last Rate Last Dose  . acetaminophen (TYLENOL) tablet 650 mg  650 mg Oral Q6H PRN Jhordyn Hoopingarner T, MD      . alum & mag hydroxide-simeth (MAALOX/MYLANTA) 200-200-20 MG/5ML suspension 30 mL  30 mL Oral Q4H PRN Ramel Tobon T, MD      . carbamazepine (TEGRETOL) tablet 400 mg  400 mg Oral BID PC Jaxon Flatt, Jackquline Denmark, MD   400 mg at 02/20/17 5409  . hydrOXYzine (ATARAX/VISTARIL) tablet 50 mg  50 mg Oral TID PRN Wlliam Grosso T, MD      . insulin aspart (novoLOG) injection 0-15 Units  0-15 Units Subcutaneous TID WC Mateo Overbeck T, MD      . LORazepam (ATIVAN) tablet 2 mg  2 mg Oral Q4H PRN Peyten Punches T, MD      . magnesium hydroxide (MILK OF MAGNESIA) suspension 30 mL  30 mL Oral Daily PRN Phelicia Dantes T, MD      . metFORMIN (GLUCOPHAGE) tablet 500 mg  500 mg Oral BID WC Nyilah Kight, Jackquline Denmark, MD   500 mg at 02/20/17 8119  . propranolol (INDERAL) tablet 20 mg  20 mg Oral BID Boomer Winders, Jackquline Denmark, MD   20 mg at 02/20/17 0839  . rosuvastatin (CRESTOR) tablet 40 mg  40 mg Oral q1800 Saarah Dewing, Jackquline Denmark, MD   40 mg at 02/19/17 1736  . traZODone (DESYREL) tablet 100 mg  100 mg Oral QHS PRN Kiamesha Samet T, MD   100 mg at 02/19/17 2225  . ziprasidone (GEODON) capsule 80 mg  80 mg Oral BID WC Rosa Gambale, Jackquline Denmark, MD   80 mg at 02/20/17 1478   PTA Medications: Prescriptions Prior to Admission  Medication Sig Dispense Refill Last Dose  . carbamazepine (TEGRETOL) 200 MG tablet Take 2 tablets (400 mg total) by mouth 2 (two) times daily. 120 tablet 0 unknown  at unknown  . LORazepam (ATIVAN) 2 MG tablet Take 1 tablet (2 mg total) by mouth at bedtime as needed for anxiety. 15 tablet 0 unknown at unknown  . metFORMIN (GLUCOPHAGE) 1000 MG tablet Take 1 tablet (1,000 mg total) by mouth 2 (two) times daily with a meal. (Patient not taking: Reported on 02/19/2017) 60 tablet 0 Not Taking at unknown  . metFORMIN (GLUCOPHAGE) 500 MG tablet Take by mouth 2 (two) times daily with a meal.   unknown at unknown  . propranolol (INDERAL) 20 MG tablet Take 20 mg by mouth 2 (two) times daily.    unknown at unknown  . rosuvastatin (CRESTOR) 40 MG tablet Take 40 mg by mouth daily.   unknown at unknown  . ziprasidone (GEODON) 80 MG capsule Take 1 capsule (80 mg total) by mouth 2 (two) times daily with a meal. (Patient not taking: Reported on 02/19/2017) 60 capsule 0 Not Taking at Unknown time    Musculoskeletal: Strength & Muscle Tone: within normal limits Gait & Station: normal Patient leans: N/A  Psychiatric Specialty Exam: Physical Exam  Nursing note and vitals reviewed. Constitutional: She appears well-developed and well-nourished.  HENT:  Head: Normocephalic and atraumatic.  Eyes: Pupils are equal, round, and reactive to light. Conjunctivae are normal.  Neck: Normal range of motion.  Cardiovascular: Regular rhythm and normal heart sounds.   Respiratory: Effort normal and breath sounds normal.  GI: Soft.  Musculoskeletal: Normal range of motion.  Neurological: She is alert.  Skin: Skin is warm and dry.  Psychiatric: Her affect is blunt. Her speech is delayed. She is slowed. Thought content is not paranoid. She expresses impulsivity. She expresses no homicidal and no suicidal ideation. She exhibits abnormal recent memory.    Review of Systems  Constitutional: Negative.   HENT: Negative.   Eyes: Negative.   Respiratory: Negative.   Cardiovascular: Negative.   Gastrointestinal: Negative.   Musculoskeletal: Negative.   Skin: Negative.   Neurological:  Negative.   Psychiatric/Behavioral: Positive for hallucinations and memory loss. Negative for depression, substance abuse and suicidal ideas. The patient is nervous/anxious and has insomnia.     Blood pressure (!) 134/96, pulse 91, temperature 98.8 F (37.1 C), resp. rate 18.There is no height or weight on file to calculate BMI.  General Appearance: Disheveled  Eye Contact:  Fair  Speech:  Slow  Volume:  Decreased  Mood:  Euthymic  Affect:  Constricted  Thought Process:  Goal Directed  Orientation:  Full (Time, Place, and Person)  Thought Content:  Tangential  Suicidal Thoughts:  No  Homicidal Thoughts:  No  Memory:  Immediate;   Fair Recent;   Fair Remote;   Fair  Judgement:  Fair  Insight:  Fair  Psychomotor Activity:  Increased  Concentration:  Concentration: Fair  Recall:  [employer] of Knowledge:  Fair  Language:  Fair  Akathisia:  No  Handed:  Right  AIMS (if indicated):     Assets:  Desire for Improvement Resilience  ADL's:  Intact  Cognition:  Impaired,  Mild  Sleep:  Number of Hours: 6    Treatment Plan Summary: Daily contact with patient to assess and evaluate symptoms and progress in treatment, Medication management and Plan 61 year old woman with schizoaffective disorder or bipolar disorder. When she came to the emergency room she was extremely disorganized with bizarre thinking and almost complete thought blocking and inability to communicate. Today she is significantly better. She still has significant thought blocking but is able to talk with me more reasonably and shows some insight. She has been compliant with medicine. Patient will be continued on current medicines for now and stabilized. Engage in individual and group therapy. We can make contact with the husband at some point. I am going to put in orders to check her blood sugars regularly.  Observation Level/Precautions:  15 minute checks  Laboratory:  Chemistry Profile  Psychotherapy:    Medications:     Consultations:    Discharge Concerns:    Estimated LOS:  Other:     Physician Treatment Plan for Primary Diagnosis: Schizoaffective disorder, bipolar type (HCC) Long Term Goal(s): Improvement in symptoms so as ready for discharge  Short Term Goals: Ability to verbalize feelings will improve and Ability to demonstrate self-control will improve  Physician Treatment Plan for Secondary Diagnosis: Principal Problem:   Schizoaffective disorder, bipolar type (HCC)  Long Term Goal(s): Improvement in symptoms so as ready for discharge  Short Term Goals: Ability to maintain clinical measurements within normal limits will improve and Compliance with prescribed medications will improve  I certify that inpatient services furnished can reasonably be expected to improve the patient's condition.    Mordecai Rasmussen, MD 9/29/20183:36 PM

## 2017-02-20 NOTE — [facility] Group Notes (Signed)
[facility] Group Notes:  (Nursing/MHT/Case Management/Adjunct)  Date:  02/20/2017  Time:  11:12 PM  Type of Therapy:  Evening Wrap-up Group  Participation Level:  Minimal  Participation Quality:  Appropriate  Affect:  Appropriate  Cognitive:  Alert and Appropriate  Insight:  Appropriate and Improving  Engagement in Group:  Developing/Improving  Modes of Intervention:  Discussion  Summary of Progress/Problems:  Rita Sullivan 02/20/2017, 11:12 PM

## 2017-02-20 NOTE — Progress Notes (Signed)
Patient up on the unit and in the dayroom. She is med compliant and does ask questions. She denies si, hi,a vh. But she does look as if she is responding to internal stimuli. She is pleasant and cooperative.

## 2017-02-20 NOTE — [facility] Counselor (Signed)
Adult Comprehensive Assessment  Patient ID: Rita Sullivan, female   DOB: 02/23/1956, 61 y.o.   MRN: [id number]  Information Source: Information source: Patient  Current Stressors:  Employment / Job issues: on SSDI Family Relationships: Husband is main support  Living/Environment/Situation:  Living Arrangements: Spouse/significant other  Family History:  Marital status: Married Number of Years Married: 27 What types of issues is patient dealing with in the relationship?: n/a Additional relationship information: n/a Are you sexually active?: Yes What is your sexual orientation?: heterosexual Has your sexual activity been affected by drugs, alcohol, medication, or emotional stress?: n/a Does patient have children?: Yes How many children?: 2 How is patient's relationship with their children?: 1 girl and 1 boy. Both live [place] but patient states she only sees them during the holidays.   Childhood History:  By whom was/is the patient raised?: Both parents Additional childhood history information: n/a Description of patient's relationship with caregiver when they were a child: Pt states she had a a pretty good childhood.  How were you disciplined when you got in trouble as a child/adolescent?: n/a Did patient suffer any verbal/emotional/physical/sexual abuse as a child?: No Has patient ever been sexually abused/assaulted/raped as an adolescent or adult?: No Was the patient ever a victim of a crime or a disaster?: No Witnessed domestic violence?: No Has patient been effected by domestic violence as an adult?: No  Education:  Currently a Consulting civil engineer?: No Name of school: n/a Learning disability?: No  Employment/Work Situation:   Employment situation: On disability Why is patient on disability: Mental Health How long has patient been on disability: 2003 Patient's job has been impacted by current illness: No What is the longest time patient has a held a job?: 14 years 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 Are These Weapons Safely Secured?:  (Will confirm Pt safe from weapons with husband)  Surveyor, quantity Resources:   Financial resources: [employer], Income from spouse Does patient have a Lawyer or guardian?: No  Alcohol/Substance Abuse:   If attempted suicide, did drugs/alcohol play a role in this?: No Alcohol/Substance Abuse Treatment Hx: Denies past history Has alcohol/substance abuse ever caused legal problems?: No  Social Support System:   Patient's Community Support System: Poor  Leisure/Recreation:   Leisure and Hobbies: Glass blower/designer and painting  Strengths/Needs:      Discharge Plan:   Does patient have access to transportation?: Yes (Husband) Will patient be returning to same living situation after discharge?: Yes Currently receiving community mental health services: Yes (From Whom) ([facility] Dr. Omelia Blackwater) If no, would patient like referral for services when discharged?: No Does patient have financial barriers related to discharge medications?: No  Summary/Recommendations:   Summary and Recommendations (to be completed by the evaluator): Pt is 61yo female admitted for worsening mood symptoms, paranoia, thought blocking.  Her husband is her primary support.  She is a long-term patient of Dr. Lilia Pro with [facility] since 2003.  While on the unit it is reccommended she participate in therapeutic milieu and groups. She will have medications managed and assistance with appropriate discharge planning.  Cleda Daub Jaria Conway.LCSW 02/20/2017

## 2017-02-20 NOTE — [facility] Group Notes (Signed)
LCSW Group Therapy Note  02/20/2017 1:00pm  Type of Therapy and Topic:  Group Therapy:  Cognitive Distortions  Participation Level:  Minimal   Description of Group:    Patients in this group will be introduced to the topic of cognitive distortions.  Patients will identify and describe cognitive distortions, describe the feelings these distortions create for them.  Patients will identify one or more situations in their personal life where they have cognitively distorted thinking and will verbalize challenging this cognitive distortion through positive thinking skills.  Patients will practice the skill of using positive affirmations to challenge cognitive distortions using affirmation cards.    Therapeutic Goals:  1. Patient will identify two or more cognitive distortions they have used 2. Patient will identify one or more emotions that stem from use of a cognitive distortion 3. Patient will demonstrate use of a positive affirmation to counter a cognitive distortion through discussion and/or role play. 4. Patient will describe one way cognitive distortions can be detrimental to wellness   Summary of Patient Progress:  Pt participated in group and able to follow group discussion.  She contributed minimally.   Therapeutic Modalities:   Cognitive Behavioral Therapy Motivational Interviewing   Glennon Mac, LCSW 02/20/2017 2:09 PM

## 2017-02-20 NOTE — Progress Notes (Signed)
Isolative to her room most of the shift. At 2100 came out for meds, BP was 120/90 and pulse was 140 apicaly. Pulse remained regular and was 140 again at 2130. Dr Toni Amend was notified. Pt is asymptomatic, no dizziness, denied chest pain. EKG was ordered but refused by the pt. Stated "i wont do it if my primary physician didn't order it." Heart rate at that time was 130 apical. She then began pacing the hall clo poor sleep and requested ativan. MD ordered ativan 2 mg and when it was offered to the pt she refused again.Marland Kitchen Has returned to her room and is quiet at this time.

## 2017-02-20 NOTE — Plan of Care (Signed)
Problem: Activity: Goal: Interest or engagement in activities will improve Outcome: Not Progressing Pt will come out of room and interact with peers this shift.

## 2017-02-21 LAB — GLUCOSE, CAPILLARY
GLUCOSE-CAPILLARY: 184 mg/dL — AB (ref 65–99)
GLUCOSE-CAPILLARY: 120 mg/dL — AB (ref 65–99)
GLUCOSE-CAPILLARY: 142 mg/dL — AB (ref 65–99)
Glucose-Capillary: 140 mg/dL — ABNORMAL HIGH (ref 65–99)

## 2017-02-21 NOTE — [facility] Suicide Risk Assessment (Signed)
[facility] INPATIENT:  Family/Significant Other Suicide Prevention Education  Suicide Prevention Education:  Education Completed;Ruperto Zolayvar, [phone] (spouse) has been identified by the patient as the family member/significant other with whom the patient will be residing, and identified as the person(s) who will aid the patient in the event of a mental health crisis (suicidal ideations/suicide attempt).  With written consent from the patient, the family member/significant other has been provided the following suicide prevention education, prior to the and/or following the discharge of the patient.  The suicide prevention education provided includes the following:  Suicide risk factors  Suicide prevention and interventions  National Suicide Hotline telephone number  [facility] assessment telephone number  [facility] Emergency Assistance 911  [facility] and/or Residential Mobile Crisis Unit telephone number  Request made of family/significant other to:  Remove weapons (e.g., guns, rifles, knives), all items previously/currently identified as safety concern.    Remove drugs/medications (over-the-counter, prescriptions, illicit drugs), all items previously/currently identified as a safety concern.  The family member/significant other verbalizes understanding of the suicide prevention education information provided.  The family member/significant other agrees to remove the items of safety concern listed above.  Karina Nofsinger L Liley Rake MSW, LCSW  02/21/2017, 10:57 AM

## 2017-02-21 NOTE — Progress Notes (Signed)
Patient is alert and oriented to self. Pleasant and cooperative with treatment plan. Patient denies SI or hearing voices stating "No, not right now." Present in the community and participated in group this afternoon. Other than meals/meds and group, patient isolative to room. Patient remains safe on the unit, 15 minutes checks performed by nursing staff.

## 2017-02-21 NOTE — [facility] Group Notes (Signed)
LCSW Group Therapy Note  02/21/2017 2:45pm  Type of Therapy/Topic:  Group Therapy:  Balance in Life  Participation Level:  Did Not Attend  Description of Group:   This group will address the concept of balance and how it feels and looks when one is unbalanced. Patients will be encouraged to process areas in their lives that are out of balance and identify reasons for remaining unbalanced. Facilitators will guide patients in utilizing problem-solving interventions to address and correct the stressor making their life unbalanced. Understanding and applying boundaries will be explored and addressed for obtaining and maintaining a balanced life. Patients will be encouraged to explore ways to assertively make their unbalanced needs known to significant others in their lives, using other group members and facilitator for support and feedback.  Therapeutic Goals: 1. Patient will identify two or more emotions or situations they have that consume much of in their lives. 2. Patient will identify signs/triggers that life has become out of balance:  3. Patient will identify two ways to set boundaries in order to achieve balance in their lives:  4. Patient will demonstrate ability to communicate their needs through discussion and/or role plays  Summary of Patient Progress:       Therapeutic Modalities:   Cognitive Behavioral Therapy Solution-Focused Therapy Assertiveness Training  Charidy Cappelletti L Anirudh Baiz, LCSW 02/21/2017 11:05 AM   

## 2017-02-21 NOTE — Progress Notes (Signed)
Pt remains very isolative, staying in room the entire shift except to take medications and get blood glucose checked. Pt affect is flat. Pt has minimal interactions with staff or peers. Denies depression, SI, HI or A/V hallucinations. Contracted to safety. 15 min safety checks continues.

## 2017-02-21 NOTE — Plan of Care (Signed)
Problem: Activity: Goal: Interest or engagement in activities will improve Outcome: Progressing Patient does attend group with minimal participation.  Problem: Education: Goal: Knowledge of [facility] Education information/materials will improve Outcome: Not Progressing Patient does not exhibit understanding of the education provided. Goal: Emotional status will improve Outcome: Not Progressing Patient remains flat. Goal: Mental status will improve Outcome: Not Progressing Patient's mental status has not improved since admission Goal: Verbalization of understanding the information provided will improve Outcome: Not Progressing Patient has not been able to verbalize information provided to her once presented by staff.  Problem: Safety: Goal: Periods of time without injury will increase Outcome: Progressing Patient remains free from injury.  Problem: Spiritual Needs Goal: Ability to function at adequate level Outcome: Not Met (add Reason) Patient is able to perform ADL's independently and feed self but pt is not able to live on her own at this time.

## 2017-02-21 NOTE — Progress Notes (Signed)
[facility] MD Progress Note  02/21/2017 3:11 PM Rita Sullivan  MRN:  [id number] Subjective:  Follow-up for Sunday the 30th. Patient says she is feeling better. She is much more lucid in her conversation. She denies any suicidal thoughts and denies any hallucinations. Understands her current situation and has been compliant with medicine. No new physical complaints. Blood sugars are a little elevated but only in the mid 100s. Vital signs stable. No new physical problems. Principal Problem: Schizoaffective disorder, bipolar type (HCC) Diagnosis:   Patient Active Problem List   Diagnosis Date Noted  . Tardive dyskinesia [G24.01] 02/19/2017  . Schizoaffective disorder, bipolar type (HCC) [F25.0] 08/02/2016  . Dyslipidemia [E78.5] 07/31/2016  . Akathisia [G25.71] 07/31/2016  . Vitamin D deficiency [E55.9] 07/31/2016  . Diabetes mellitus type 2, diet-controlled (HCC) [E11.9] 04/11/2015   Total Time spent with patient: 30 minutes  Past Psychiatric History: Past history of recurrent episodes of psychosis as part of schizoaffective disorder.  Past Medical History:  Past Medical History:  Diagnosis Date  . Bipolar disorder (HCC)   . Chronic kidney disease   . Diabetes mellitus without complication (HCC)   . Hypercholesteremia   . Manic behavior (HCC)     Past Surgical History:  Procedure Laterality Date  . COLONOSCOPY WITH PROPOFOL N/A 03/11/2015   Procedure: COLONOSCOPY WITH PROPOFOL;  Surgeon: Wallace Cullens, MD;  Location: [facility] ENDOSCOPY;  Service: Gastroenterology;  Laterality: N/A;   Family History:  Family History  Problem Relation Age of Onset  . Diabetes Father   . Diabetes Sister    Family Psychiatric  History: Positive Social History:  History  Alcohol Use  . 1.2 oz/week  . 2 Glasses of wine per week    Comment: Occ     History  Drug Use No    Social History   Social History  . Marital status: Married    Spouse name: N/A  . Number of children: N/A  . Years of education:  N/A   Social History Main Topics  . Smoking status: Never Smoker  . Smokeless tobacco: Never Used  . Alcohol use 1.2 oz/week    2 Glasses of wine per week     Comment: Occ  . Drug use: No  . Sexual activity: No   Other Topics Concern  . None   Social History Narrative  . None   Additional Social History:    History of alcohol / drug use?: Yes Negative Consequences of Use: Personal relationships Withdrawal Symptoms:  (none)                    Sleep: Fair  Appetite:  Fair  Current Medications: Current Facility-Administered Medications  Medication Dose Route Frequency Provider Last Rate Last Dose  . acetaminophen (TYLENOL) tablet 650 mg  650 mg Oral Q6H PRN Dezmon Conover T, MD      . alum & mag hydroxide-simeth (MAALOX/MYLANTA) 200-200-20 MG/5ML suspension 30 mL  30 mL Oral Q4H PRN Tayen Narang T, MD      . carbamazepine (TEGRETOL) tablet 400 mg  400 mg Oral BID PC Lavanya Roa T, MD   400 mg at 02/21/17 1191  . hydrOXYzine (ATARAX/VISTARIL) tablet 50 mg  50 mg Oral TID PRN Nyra Anspaugh T, MD      . insulin aspart (novoLOG) injection 0-15 Units  0-15 Units Subcutaneous TID WC Alexsandra Shontz, Jackquline Denmark, MD   2 Units at 02/21/17 1215  . LORazepam (ATIVAN) tablet 2 mg  2 mg Oral Q4H  PRN Aneshia Jacquet, Jackquline Denmark, MD      . magnesium hydroxide (MILK OF MAGNESIA) suspension 30 mL  30 mL Oral Daily PRN Camdynn Maranto T, MD      . metFORMIN (GLUCOPHAGE) tablet 500 mg  500 mg Oral BID WC Tavone Caesar, Jackquline Denmark, MD   500 mg at 02/21/17 0740  . propranolol (INDERAL) tablet 20 mg  20 mg Oral BID Iley Deignan, Jackquline Denmark, MD   20 mg at 02/21/17 0740  . rosuvastatin (CRESTOR) tablet 40 mg  40 mg Oral q1800 Slayton Lubitz, Jackquline Denmark, MD   40 mg at 02/20/17 1752  . traZODone (DESYREL) tablet 100 mg  100 mg Oral QHS PRN Rosela Supak, Jackquline Denmark, MD   100 mg at 02/19/17 2225  . ziprasidone (GEODON) capsule 80 mg  80 mg Oral BID WC Anaclara Acklin, Jackquline Denmark, MD   80 mg at 02/21/17 1610    Lab Results:  Results for orders placed or performed  during the hospital encounter of 02/19/17 (from the past 48 hour(s))  Hemoglobin A1c     Status: Abnormal   Collection Time: 02/20/17  6:44 AM  Result Value Ref Range   Hgb A1c MFr Bld 6.3 (H) 4.8 - 5.6 %    Comment: (NOTE) Pre diabetes:          5.7%-6.4% Diabetes:              >6.4% Glycemic control for   <7.0% adults with diabetes    Mean Plasma Glucose 134.11 mg/dL    Comment: Performed at [facility], [address] [place], [place] [address]  Lipid panel     Status: Abnormal   Collection Time: 02/20/17  6:44 AM  Result Value Ref Range   Cholesterol 220 (H) 0 - 200 mg/dL   Triglycerides 409 <811 mg/dL   HDL 63 >91 mg/dL   Total CHOL/HDL Ratio 3.5 RATIO   VLDL 26 0 - 40 mg/dL   LDL Cholesterol 478 (H) 0 - 99 mg/dL    Comment:        Total Cholesterol/HDL:CHD Risk Coronary Heart Disease Risk Table                     Men   Women  1/2 Average Risk   3.4   3.3  Average Risk       5.0   4.4  2 X Average Risk   9.6   7.1  3 X Average Risk  23.4   11.0        Use the calculated Patient Ratio above and the CHD Risk Table to determine the patient's CHD Risk.        ATP III CLASSIFICATION (LDL):  <100     mg/dL   Optimal  295-621  mg/dL   Near or Above                    Optimal  130-159  mg/dL   Borderline  308-657  mg/dL   High  >846     mg/dL   Very High   TSH     Status: None   Collection Time: 02/20/17  6:44 AM  Result Value Ref Range   TSH 3.063 0.350 - 4.500 uIU/mL    Comment: Performed by a 3rd Generation assay with a functional sensitivity of <=0.01 uIU/mL.  Glucose, capillary     Status: Abnormal   Collection Time: 02/20/17  4:38 PM  Result Value Ref Range  Glucose-Capillary 156 (H) 65 - 99 mg/dL   Comment 1 Notify RN   Glucose, capillary     Status: Abnormal   Collection Time: 02/20/17  8:36 PM  Result Value Ref Range   Glucose-Capillary 113 (H) 65 - 99 mg/dL  Glucose, capillary     Status: Abnormal   Collection Time: 02/21/17  6:51 AM  Result  Value Ref Range   Glucose-Capillary 184 (H) 65 - 99 mg/dL  Glucose, capillary     Status: Abnormal   Collection Time: 02/21/17 11:15 AM  Result Value Ref Range   Glucose-Capillary 140 (H) 65 - 99 mg/dL   Comment 1 Notify RN     Blood Alcohol level:  Lab Results  Component Value Date   ETH <10 02/18/2017   ETH <5 07/29/2016    Metabolic Disorder Labs: Lab Results  Component Value Date   HGBA1C 6.3 (H) 02/20/2017   MPG 134.11 02/20/2017   MPG 177 07/31/2016   Lab Results  Component Value Date   PROLACTIN 3.6 (L) 07/31/2016   Lab Results  Component Value Date   CHOL 220 (H) 02/20/2017   TRIG 132 02/20/2017   HDL 63 02/20/2017   CHOLHDL 3.5 02/20/2017   VLDL 26 02/20/2017   LDLCALC 131 (H) 02/20/2017   LDLCALC 101 (H) 07/31/2016    Physical Findings: AIMS: Facial and Oral Movements Muscles of Facial Expression: Mild Lips and Perioral Area: Minimal Jaw: Minimal Tongue: None, normal,Extremity Movements Upper (arms, wrists, hands, fingers): Minimal Lower (legs, knees, ankles, toes): None, normal, Trunk Movements Neck, shoulders, hips: None, normal, Overall Severity Severity of abnormal movements (highest score from questions above): Minimal Incapacitation due to abnormal movements: None, normal Patient's awareness of abnormal movements (rate only patient's report): Aware, no distress, Dental Status Current problems with teeth and/or dentures?: No Does patient usually wear dentures?: No  CIWA:  CIWA-Ar Total: 0 COWS:     Musculoskeletal: Strength & Muscle Tone: within normal limits Gait & Station: normal Patient leans: N/A  Psychiatric Specialty Exam: Physical Exam  Nursing note and vitals reviewed. Constitutional: She appears well-developed and well-nourished.  HENT:  Head: Normocephalic and atraumatic.  Eyes: Pupils are equal, round, and reactive to light. Conjunctivae are normal.  Neck: Normal range of motion.  Cardiovascular: Regular rhythm and normal  heart sounds.   Respiratory: Effort normal. No respiratory distress.  GI: Soft.  Musculoskeletal: Normal range of motion.  Neurological: She is alert.  Much less abnormal movement than yesterday  Skin: Skin is warm and dry.  Psychiatric: Judgment normal. Her affect is blunt. Her speech is delayed. She is slowed. Thought content is not paranoid. She expresses no homicidal and no suicidal ideation. She exhibits abnormal recent memory.    Review of Systems  Constitutional: Negative.   HENT: Negative.   Eyes: Negative.   Respiratory: Negative.   Cardiovascular: Negative.   Gastrointestinal: Negative.   Musculoskeletal: Negative.   Skin: Negative.   Neurological: Negative.   Psychiatric/Behavioral: Positive for memory loss. Negative for depression, hallucinations, substance abuse and suicidal ideas. The patient is not nervous/anxious and does not have insomnia.     Blood pressure 116/73, pulse 80, temperature 98.5 F (36.9 C), resp. rate 18.There is no height or weight on file to calculate BMI.  General Appearance: Disheveled  Eye Contact:  Fair  Speech:  Slow  Volume:  Decreased  Mood:  Euthymic  Affect:  Constricted  Thought Process:  Coherent  Orientation:  Full (Time, Place, and Person)  Thought Content:  Rumination and Tangential  Suicidal Thoughts:  No  Homicidal Thoughts:  No  Memory:  Immediate;   Fair Recent;   Fair Remote;   Fair  Judgement:  Fair  Insight:  Fair  Psychomotor Activity:  Shuffling Gait  Concentration:  Concentration: Fair  Recall:  [employer] of Knowledge:  Fair  Language:  Fair  Akathisia:  No  Handed:  Right  AIMS (if indicated):     Assets:  Communication Skills Desire for Improvement Financial Resources/Insurance Housing Resilience Social Support  ADL's:  Intact  Cognition:  Impaired,  Mild  Sleep:  Number of Hours: 7.45     Treatment Plan Summary: Daily contact with patient to assess and evaluate symptoms and progress in treatment,  Medication management and Plan Patient is still a little withdrawn and slow but denies any suicidal or homicidal ideation and denies any hallucinations. She is able to answer questions more clearly. Has been compliant with medicine. Too soon yet probably to recheck a Tegretol level. No change to medicine today.  Mordecai Rasmussen, MD 02/21/2017, 3:11 PM

## 2017-02-21 NOTE — Plan of Care (Signed)
Problem: Safety: Goal: Periods of time without injury will increase Outcome: Progressing Pt will remain injury free the entire shift.   

## 2017-02-22 LAB — PROLACTIN: Prolactin: 23.3 ng/mL (ref 4.8–23.3)

## 2017-02-22 LAB — GLUCOSE, CAPILLARY
GLUCOSE-CAPILLARY: 159 mg/dL — AB (ref 65–99)
GLUCOSE-CAPILLARY: 210 mg/dL — AB (ref 65–99)
GLUCOSE-CAPILLARY: 109 mg/dL — AB (ref 65–99)

## 2017-02-22 NOTE — [facility] Group Notes (Signed)
[facility] LCSW Group Therapy Note  Date/Time: 02/22/17, 0930  Type of Therapy and Topic:  Group Therapy:  Overcoming Obstacles  Participation Level:  moderate  Description of Group:    In this group patients will be encouraged to explore what they see as obstacles to their own wellness and recovery. They will be guided to discuss their thoughts, feelings, and behaviors related to these obstacles. The group will process together ways to cope with barriers, with attention given to specific choices patients can make. Each patient will be challenged to identify changes they are motivated to make in order to overcome their obstacles. This group will be process-oriented, with patients participating in exploration of their own experiences as well as giving and receiving support and challenge from other group members.  Therapeutic Goals: 1. Patient will identify personal and current obstacles as they relate to admission. 2. Patient will identify barriers that currently interfere with their wellness or overcoming obstacles.  3. Patient will identify feelings, thought process and behaviors related to these barriers. 4. Patient will identify two changes they are willing to make to overcome these obstacles:    Summary of Patient Progress: Pt identified her bipolar disorder diagnosis as an obstacle in her life.  Pt was able to talk about knowing the importance of staying on her medication as she tries to overcome this obstacle.  Pt made several contributions to group discussion.        Therapeutic Modalities:   Cognitive Behavioral Therapy Solution Focused Therapy Motivational Interviewing Relapse Prevention Therapy  [name], LCSW

## 2017-02-22 NOTE — Progress Notes (Signed)
Patient isolated in her room most of the time.Patient affect is blunted.Denies suicidal or homicidal ideations and AV hallucinations.Compliant with medications.Attended groups.Appetite and energy level good.Support and encouragement given.

## 2017-02-22 NOTE — Plan of Care (Signed)
Problem: Activity: Goal: Interest or engagement in activities will improve Outcome: Progressing Patient attended group activities.  Problem: Coping: Goal: Ability to verbalize frustrations and anger appropriately will improve Outcome: Progressing Patient is able to verbalize her anxiety to staff.  Problem: Safety: Goal: Periods of time without injury will increase Outcome: Progressing Patient remained safe in the unit.

## 2017-02-22 NOTE — Progress Notes (Signed)
[facility] MD Progress Note  02/22/2017 4:29 PM TIM WILHIDE  MRN:  [id number] Subjective:  Follow-up for Sunday the 30th. Patient says she is feeling better. She is much more lucid in her conversation. She denies any suicidal thoughts and denies any hallucinations. Understands her current situation and has been compliant with medicine. No new physical complaints. Blood sugars are a little elevated but only in the mid 100s. Vital signs stable. No new physical problems.  Follow-up for Monday, October 1. Patient says she is feeling better. She still claims she is sleeping poorly but she says she is not having any hallucinations and her mood is feeling good. She still seems slow in her thinking and blunted in her mood but she has been cooperative with medicine and not been showing any dangerous behavior. Tardive dyskinesia seems no worse and probably better than when I first saw her. Blood sugars continue to be intermittently elevated Principal Problem: Schizoaffective disorder, bipolar type (HCC) Diagnosis:   Patient Active Problem List   Diagnosis Date Noted  . Tardive dyskinesia [G24.01] 02/19/2017  . Schizoaffective disorder, bipolar type (HCC) [F25.0] 08/02/2016  . Dyslipidemia [E78.5] 07/31/2016  . Akathisia [G25.71] 07/31/2016  . Vitamin D deficiency [E55.9] 07/31/2016  . Diabetes mellitus type 2, diet-controlled (HCC) [E11.9] 04/11/2015   Total Time spent with patient: 30 minutes  Past Psychiatric History: Past history of recurrent episodes of psychosis as part of schizoaffective disorder.  Past Medical History:  Past Medical History:  Diagnosis Date  . Bipolar disorder (HCC)   . Chronic kidney disease   . Diabetes mellitus without complication (HCC)   . Hypercholesteremia   . Manic behavior (HCC)     Past Surgical History:  Procedure Laterality Date  . COLONOSCOPY WITH PROPOFOL N/A 03/11/2015   Procedure: COLONOSCOPY WITH PROPOFOL;  Surgeon: Wallace Cullens, MD;  Location: [facility] ENDOSCOPY;   Service: Gastroenterology;  Laterality: N/A;   Family History:  Family History  Problem Relation Age of Onset  . Diabetes Father   . Diabetes Sister    Family Psychiatric  History: Positive Social History:  History  Alcohol Use  . 1.2 oz/week  . 2 Glasses of wine per week    Comment: Occ     History  Drug Use No    Social History   Social History  . Marital status: Married    Spouse name: N/A  . Number of children: N/A  . Years of education: N/A   Social History Main Topics  . Smoking status: Never Smoker  . Smokeless tobacco: Never Used  . Alcohol use 1.2 oz/week    2 Glasses of wine per week     Comment: Occ  . Drug use: No  . Sexual activity: No   Other Topics Concern  . None   Social History Narrative  . None   Additional Social History:    History of alcohol / drug use?: Yes Negative Consequences of Use: Personal relationships Withdrawal Symptoms:  (none)                    Sleep: Fair  Appetite:  Fair  Current Medications: Current Facility-Administered Medications  Medication Dose Route Frequency Provider Last Rate Last Dose  . acetaminophen (TYLENOL) tablet 650 mg  650 mg Oral Q6H PRN Lawayne Hartig T, MD      . alum & mag hydroxide-simeth (MAALOX/MYLANTA) 200-200-20 MG/5ML suspension 30 mL  30 mL Oral Q4H PRN Micky Sheller, Jackquline Denmark, MD      .  carbamazepine (TEGRETOL) tablet 400 mg  400 mg Oral BID PC Moriah Loughry T, MD   400 mg at 02/22/17 0740  . hydrOXYzine (ATARAX/VISTARIL) tablet 50 mg  50 mg Oral TID PRN Dason Mosley T, MD      . insulin aspart (novoLOG) injection 0-15 Units  0-15 Units Subcutaneous TID WC Tigerlily Christine, Jackquline Denmark, MD   5 Units at 02/22/17 1138  . LORazepam (ATIVAN) tablet 2 mg  2 mg Oral Q4H PRN Brissa Asante T, MD      . magnesium hydroxide (MILK OF MAGNESIA) suspension 30 mL  30 mL Oral Daily PRN Jamiria Langill T, MD      . metFORMIN (GLUCOPHAGE) tablet 500 mg  500 mg Oral BID WC Noe Pittsley, Jackquline Denmark, MD   500 mg at 02/22/17 0814  .  propranolol (INDERAL) tablet 20 mg  20 mg Oral BID Gevin Perea, Jackquline Denmark, MD   20 mg at 02/22/17 0739  . rosuvastatin (CRESTOR) tablet 40 mg  40 mg Oral q1800 Melaysia Streed, Jackquline Denmark, MD   40 mg at 02/21/17 1723  . traZODone (DESYREL) tablet 100 mg  100 mg Oral QHS PRN Tigran Haynie T, MD   100 mg at 02/19/17 2225  . ziprasidone (GEODON) capsule 80 mg  80 mg Oral BID WC Deontae Robson T, MD   80 mg at 02/22/17 4098    Lab Results:  Results for orders placed or performed during the hospital encounter of 02/19/17 (from the past 48 hour(s))  Glucose, capillary     Status: Abnormal   Collection Time: 02/20/17  4:38 PM  Result Value Ref Range   Glucose-Capillary 156 (H) 65 - 99 mg/dL   Comment 1 Notify RN   Glucose, capillary     Status: Abnormal   Collection Time: 02/20/17  8:36 PM  Result Value Ref Range   Glucose-Capillary 113 (H) 65 - 99 mg/dL  Glucose, capillary     Status: Abnormal   Collection Time: 02/21/17  6:51 AM  Result Value Ref Range   Glucose-Capillary 184 (H) 65 - 99 mg/dL  Glucose, capillary     Status: Abnormal   Collection Time: 02/21/17 11:15 AM  Result Value Ref Range   Glucose-Capillary 140 (H) 65 - 99 mg/dL   Comment 1 Notify RN   Glucose, capillary     Status: Abnormal   Collection Time: 02/21/17  4:25 PM  Result Value Ref Range   Glucose-Capillary 120 (H) 65 - 99 mg/dL  Glucose, capillary     Status: Abnormal   Collection Time: 02/21/17  8:26 PM  Result Value Ref Range   Glucose-Capillary 142 (H) 65 - 99 mg/dL  Glucose, capillary     Status: Abnormal   Collection Time: 02/22/17  6:47 AM  Result Value Ref Range   Glucose-Capillary 159 (H) 65 - 99 mg/dL  Glucose, capillary     Status: Abnormal   Collection Time: 02/22/17 11:24 AM  Result Value Ref Range   Glucose-Capillary 210 (H) 65 - 99 mg/dL    Blood Alcohol level:  Lab Results  Component Value Date   ETH <10 02/18/2017   ETH <5 07/29/2016    Metabolic Disorder Labs: Lab Results  Component Value Date    HGBA1C 6.3 (H) 02/20/2017   MPG 134.11 02/20/2017   MPG 177 07/31/2016   Lab Results  Component Value Date   PROLACTIN 23.3 02/20/2017   PROLACTIN 3.6 (L) 07/31/2016   Lab Results  Component Value Date   CHOL 220 (H) 02/20/2017  TRIG 132 02/20/2017   HDL 63 02/20/2017   CHOLHDL 3.5 02/20/2017   VLDL 26 02/20/2017   LDLCALC 131 (H) 02/20/2017   LDLCALC 101 (H) 07/31/2016    Physical Findings: AIMS: Facial and Oral Movements Muscles of Facial Expression: Mild Lips and Perioral Area: Minimal Jaw: Minimal Tongue: None, normal,Extremity Movements Upper (arms, wrists, hands, fingers): Minimal Lower (legs, knees, ankles, toes): None, normal, Trunk Movements Neck, shoulders, hips: None, normal, Overall Severity Severity of abnormal movements (highest score from questions above): Minimal Incapacitation due to abnormal movements: None, normal Patient's awareness of abnormal movements (rate only patient's report): Aware, no distress, Dental Status Current problems with teeth and/or dentures?: No Does patient usually wear dentures?: No  CIWA:  CIWA-Ar Total: 0 COWS:     Musculoskeletal: Strength & Muscle Tone: within normal limits Gait & Station: normal Patient leans: N/A  Psychiatric Specialty Exam: Physical Exam  Nursing note and vitals reviewed. Constitutional: She appears well-developed and well-nourished.  HENT:  Head: Normocephalic and atraumatic.  Eyes: Pupils are equal, round, and reactive to light. Conjunctivae are normal.  Neck: Normal range of motion.  Cardiovascular: Regular rhythm and normal heart sounds.   Respiratory: Effort normal. No respiratory distress.  GI: Soft.  Musculoskeletal: Normal range of motion.  Neurological: She is alert.  Much less abnormal movement than yesterday  Skin: Skin is warm and dry.  Psychiatric: Judgment normal. Her affect is blunt. Her speech is delayed. She is slowed. Thought content is not paranoid. She expresses no homicidal  and no suicidal ideation. She exhibits abnormal recent memory.    Review of Systems  Constitutional: Negative.   HENT: Negative.   Eyes: Negative.   Respiratory: Negative.   Cardiovascular: Negative.   Gastrointestinal: Negative.   Musculoskeletal: Negative.   Skin: Negative.   Neurological: Negative.   Psychiatric/Behavioral: Positive for memory loss. Negative for depression, hallucinations, substance abuse and suicidal ideas. The patient is not nervous/anxious and does not have insomnia.     Blood pressure 117/75, pulse 84, temperature 98.1 F (36.7 C), resp. rate 18.There is no height or weight on file to calculate BMI.  General Appearance: Disheveled  Eye Contact:  Fair  Speech:  Slow  Volume:  Decreased  Mood:  Euthymic  Affect:  Constricted  Thought Process:  Coherent  Orientation:  Full (Time, Place, and Person)  Thought Content:  Rumination and Tangential  Suicidal Thoughts:  No  Homicidal Thoughts:  No  Memory:  Immediate;   Fair Recent;   Fair Remote;   Fair  Judgement:  Fair  Insight:  Fair  Psychomotor Activity:  Shuffling Gait  Concentration:  Concentration: Fair  Recall:  [employer] of Knowledge:  Fair  Language:  Fair  Akathisia:  No  Handed:  Right  AIMS (if indicated):     Assets:  Communication Skills Desire for Improvement Financial Resources/Insurance Housing Resilience Social Support  ADL's:  Intact  Cognition:  Impaired,  Mild  Sleep:  Number of Hours: 8.15     Treatment Plan Summary: Daily contact with patient to assess and evaluate symptoms and progress in treatment, Medication management and Plan Patient appears to be significantly better. Order is in to check her carbamazepine level tomorrow. Case reviewed with social work. Probably looking at somewhere on the order of 3 or 4 days in the hospital would be my guess. No other change to medicine today.  Mordecai Rasmussen, MD 02/22/2017, 4:29 PM

## 2017-02-22 NOTE — [facility] Group Notes (Signed)
[facility] Group Notes:  (Nursing/MHT/Case Management/Adjunct)  Date:  02/22/2017  Time:  2:33 PM  Type of Therapy:  Psychoeducational Skills  Participation Level:  Active  Participation Quality:  Attentive  Affect:  Appropriate  Cognitive:  Appropriate  Insight:  Appropriate  Engagement in Group:  Engaged  Modes of Intervention:  Discussion, Education and Support  Summary of Progress/Problems:  Rita Sullivan Rita Sullivan 02/22/2017, 2:33 PM

## 2017-02-23 LAB — GLUCOSE, CAPILLARY
GLUCOSE-CAPILLARY: 157 mg/dL — AB (ref 65–99)
GLUCOSE-CAPILLARY: 143 mg/dL — AB (ref 65–99)
Glucose-Capillary: 172 mg/dL — ABNORMAL HIGH (ref 65–99)
Glucose-Capillary: 145 mg/dL — ABNORMAL HIGH (ref 65–99)

## 2017-02-23 LAB — CARBAMAZEPINE LEVEL, TOTAL: CARBAMAZEPINE LVL: 9.2 ug/mL (ref 4.0–12.0)

## 2017-02-23 NOTE — Plan of Care (Signed)
Problem: Activity: Goal: Sleeping patterns will improve Outcome: Progressing Patient slept for Estimated Hours of 7; Precautionary checks every 15 minutes for safety maintained, room free of safety hazards, patient sustains no injury or falls during this shift.    

## 2017-02-23 NOTE — [facility] Group Notes (Signed)
Goals Group Date/Time: 02/23/2017 9:00 AM Type of Therapy and Topic: Group Therapy: Goals Group: SMART Goals   Participation Level: Moderate  Description of Group:    The purpose of a daily goals group is to assist and guide patients in setting recovery/wellness-related goals. The objective is to set goals as they relate to the crisis in which they were admitted. Patients will be using SMART goal modalities to set measurable goals. Characteristics of realistic goals will be discussed and patients will be assisted in setting and processing how one will reach their goal. Facilitator will also assist patients in applying interventions and coping skills learned in psycho-education groups to the SMART goal and process how one will achieve defined goal.   Therapeutic Goals:   -Patients will develop and document one goal related to or their crisis in which brought them into treatment.  -Patients will be guided by LCSW using SMART goal setting modality in how to set a measurable, attainable, realistic and time sensitive goal.  -Patients will process barriers in reaching goal.  -Patients will process interventions in how to overcome and successful in reaching goal.   Patient's Goal:Pt goal is to work on her mental concentration by reading for 30 minutes today.   Therapeutic Modalities:  Motivational Interviewing  Research officer, political party  SMART goals setting   [name], [place]

## 2017-02-23 NOTE — [facility] Group Notes (Signed)
[facility] LCSW Group Therapy Note  Date/Time: 02/23/17, 0930  Type of Therapy/Topic:  Group Therapy:  Feelings about Diagnosis  Participation Level:  Active   Mood:pleasant   Description of Group:    This group will allow patients to explore their thoughts and feelings about diagnoses they have received. Patients will be guided to explore their level of understanding and acceptance of these diagnoses. Facilitator will encourage patients to process their thoughts and feelings about the reactions of others to their diagnosis, and will guide patients in identifying ways to discuss their diagnosis with significant others in their lives. This group will be process-oriented, with patients participating in exploration of their own experiences as well as giving and receiving support and challenge from other group members.   Therapeutic Goals: 1. Patient will demonstrate understanding of diagnosis as evidence by identifying two or more symptoms of the disorder:  2. Patient will be able to express two feelings regarding the diagnosis 3. Patient will demonstrate ability to communicate their needs through discussion and/or role plays  Summary of Patient Progress: Pt identified her diagnosis as bipolar disorder and was able to name a number of symptoms.  Pt engaged in good discussion of being able to recognize symptoms early on and try to work with her MD to address them before they become crises.         Therapeutic Modalities:   Cognitive Behavioral Therapy Brief Therapy Feelings Identification   [name], LCSW

## 2017-02-23 NOTE — Plan of Care (Signed)
Problem: Activity: Goal: Interest or engagement in activities will improve Outcome: Progressing Attending  Unit programing  Goal: Sleeping patterns will improve Outcome: Progressing Voice no concerns around  sleep  Problem: Education: Goal: Knowledge of [facility] Education information/materials will improve Outcome: Progressing Verbalizing understanding of information received  Goal: Emotional status will improve Outcome: Progressing Attending  Unit programing  Goal: Mental status will improve Attending  Unit programing  Goal: Verbalization of understanding the information provided will improve Verbalizing understanding of information received   Problem: Coping: Goal: Ability to verbalize frustrations and anger appropriately will improve Verbalizing understanding of information received  Attending unitprograming Goal: Ability to demonstrate self-control will improve Outcome: Progressing Working  On coping skills   Problem: Safety: Goal: Periods of time without injury will increase Outcome: Progressing No injuries this admission   Problem: Spiritual Needs Goal: Ability to function at adequate level Outcome: Progressing Verbalizing understanding of information received attending unit programing   Problem: Education: Goal: Will be free of psychotic symptoms Outcome: Progressing Staff has not witness bizarre behavior Goal: Knowledge of the prescribed therapeutic regimen will improve Outcome: Progressing Verbalize understanding of medications received

## 2017-02-23 NOTE — Tx Team (Signed)
Interdisciplinary Treatment and Diagnostic Plan Update  02/22/2017 Time of Session: 10:30am Rita Sullivan MRN: [id number]  Principal Diagnosis: Schizoaffective disorder, bipolar type [facility])  Secondary Diagnoses: Principal Problem:   Schizoaffective disorder, bipolar type (HCC)   Current Medications:  Current Facility-Administered Medications  Medication Dose Route Frequency Provider Last Rate Last Dose  . acetaminophen (TYLENOL) tablet 650 mg  650 mg Oral Q6H PRN Clapacs, John T, MD      . alum & mag hydroxide-simeth (MAALOX/MYLANTA) 200-200-20 MG/5ML suspension 30 mL  30 mL Oral Q4H PRN Clapacs, John T, MD      . carbamazepine (TEGRETOL) tablet 400 mg  400 mg Oral BID PC Clapacs, John T, MD   400 mg at 02/23/17 0810  . hydrOXYzine (ATARAX/VISTARIL) tablet 50 mg  50 mg Oral TID PRN Clapacs, John T, MD      . insulin aspart (novoLOG) injection 0-15 Units  0-15 Units Subcutaneous TID WC Clapacs, Jackquline Denmark, MD   3 Units at 02/23/17 1147  . LORazepam (ATIVAN) tablet 2 mg  2 mg Oral Q4H PRN Clapacs, John T, MD      . magnesium hydroxide (MILK OF MAGNESIA) suspension 30 mL  30 mL Oral Daily PRN Clapacs, John T, MD      . metFORMIN (GLUCOPHAGE) tablet 500 mg  500 mg Oral BID WC Clapacs, Jackquline Denmark, MD   500 mg at 02/23/17 0810  . propranolol (INDERAL) tablet 20 mg  20 mg Oral BID Clapacs, Jackquline Denmark, MD   20 mg at 02/23/17 7829  . rosuvastatin (CRESTOR) tablet 40 mg  40 mg Oral q1800 Clapacs, Jackquline Denmark, MD   40 mg at 02/22/17 1708  . traZODone (DESYREL) tablet 100 mg  100 mg Oral QHS PRN Clapacs, Jackquline Denmark, MD   100 mg at 02/19/17 2225  . ziprasidone (GEODON) capsule 80 mg  80 mg Oral BID WC Clapacs, Jackquline Denmark, MD   80 mg at 02/23/17 0810   PTA Medications: Prescriptions Prior to Admission  Medication Sig Dispense Refill Last Dose  . carbamazepine (TEGRETOL) 200 MG tablet Take 2 tablets (400 mg total) by mouth 2 (two) times daily. 120 tablet 0 unknown at unknown  . LORazepam (ATIVAN) 2 MG tablet Take 1 tablet  (2 mg total) by mouth at bedtime as needed for anxiety. 15 tablet 0 unknown at unknown  . metFORMIN (GLUCOPHAGE) 1000 MG tablet Take 1 tablet (1,000 mg total) by mouth 2 (two) times daily with a meal. (Patient not taking: Reported on 02/19/2017) 60 tablet 0 Not Taking at unknown  . metFORMIN (GLUCOPHAGE) 500 MG tablet Take by mouth 2 (two) times daily with a meal.   unknown at unknown  . propranolol (INDERAL) 20 MG tablet Take 20 mg by mouth 2 (two) times daily.    unknown at unknown  . rosuvastatin (CRESTOR) 40 MG tablet Take 40 mg by mouth daily.   unknown at unknown  . ziprasidone (GEODON) 80 MG capsule Take 1 capsule (80 mg total) by mouth 2 (two) times daily with a meal. (Patient not taking: Reported on 02/19/2017) 60 capsule 0 Not Taking at Unknown time    Patient Stressors: Health problems Medication change or noncompliance  Patient Strengths: Average or above average intelligence Religious Affiliation  Treatment Modalities: Medication Management, Group therapy, Case management,  1 to 1 session with clinician, Psychoeducation, Recreational therapy.   Physician Treatment Plan for Primary Diagnosis: Schizoaffective disorder, bipolar type (HCC) Long Term Goal(s): Improvement in symptoms so as ready for discharge  Improvement in symptoms so as ready for discharge   Short Term Goals: Ability to verbalize feelings will improve Ability to demonstrate self-control will improve Ability to maintain clinical measurements within normal limits will improve Compliance with prescribed medications will improve  Medication Management: Evaluate patient's response, side effects, and tolerance of medication regimen.  Therapeutic Interventions: 1 to 1 sessions, Unit Group sessions and Medication administration.  Evaluation of Outcomes: Progressing  Physician Treatment Plan for Secondary Diagnosis: Principal Problem:   Schizoaffective disorder, bipolar type (HCC)  Long Term Goal(s): Improvement in  symptoms so as ready for discharge Improvement in symptoms so as ready for discharge   Short Term Goals: Ability to verbalize feelings will improve Ability to demonstrate self-control will improve Ability to maintain clinical measurements within normal limits will improve Compliance with prescribed medications will improve     Medication Management: Evaluate patient's response, side effects, and tolerance of medication regimen.  Therapeutic Interventions: 1 to 1 sessions, Unit Group sessions and Medication administration.  Evaluation of Outcomes: Progressing   RN Treatment Plan for Primary Diagnosis: Schizoaffective disorder, bipolar type (HCC) Long Term Goal(s): Knowledge of disease and therapeutic regimen to maintain health will improve  Short Term Goals: Ability to verbalize feelings will improve, Ability to identify and develop effective coping behaviors will improve and Compliance with prescribed medications will improve  Medication Management: RN will administer medications as ordered by provider, will assess and evaluate patient's response and provide education to patient for prescribed medication. RN will report any adverse and/or side effects to prescribing provider.  Therapeutic Interventions: 1 on 1 counseling sessions, Psychoeducation, Medication administration, Evaluate responses to treatment, Monitor vital signs and CBGs as ordered, Perform/monitor CIWA, COWS, AIMS and Fall Risk screenings as ordered, Perform wound care treatments as ordered.  Evaluation of Outcomes: Progressing   LCSW Treatment Plan for Primary Diagnosis: Schizoaffective disorder, bipolar type (HCC) Long Term Goal(s): Safe transition to appropriate next level of care at discharge, Engage patient in therapeutic group addressing interpersonal concerns.  Short Term Goals: Engage patient in aftercare planning with referrals and resources, Increase ability to appropriately verbalize feelings, Identify triggers  associated with mental health/substance abuse issues and Increase skills for wellness and recovery  Therapeutic Interventions: Assess for all discharge needs, 1 to 1 time with Social worker, Explore available resources and support systems, Assess for adequacy in community support network, Educate family and significant other(s) on suicide prevention, Complete Psychosocial Assessment, Interpersonal group therapy.  Evaluation of Outcomes: Progressing   Progress in Treatment: Attending groups: Yes. Participating in groups: Yes. Taking medication as prescribed: Yes. Toleration medication: Yes. Family/Significant other contact made: Yes, individual(s) contacted:  Husband Patient understands diagnosis: Yes. Discussing patient identified problems/goals with staff: Yes. Medical problems stabilized or resolved: Yes. Denies suicidal/homicidal ideation: Yes. Issues/concerns per patient self-inventory: Yes. Other:    New problem(s) identified: Yes, Describe:     New Short Term/Long Term Goal(s):  Discharge Plan or Barriers: Home with Husband and follow up with [facility] Omelia [facility]water [facility]  Reason for Continuation of Hospitalization: Anxiety Delusions  Hallucinations Medication stabilization  Estimated Length of Stay:  Attendees: Patient:Rita Sullivan 02/22/2017 3:49 PM  Physician: Mariah Milling 02/22/2017 3:49 PM  Nursing:Gigi M. RN 10/01/20108 3:49 PM  RN Care Manager: 02/22/2017 3:49 PM  Social Worker: Jake Shark, LCSW 10/01/20108 3:49 PM  Recreational Therapist:    Other:    Other:    Other:     Scribe for Treatment Team: Glennon Mac, LCSW 02/22/2017 3:49 PM

## 2017-02-23 NOTE — Progress Notes (Signed)
D: Isolative  With  Unit  Programing  Noted to retreat to room  During shift. Patient stated slept good last night .Stated appetite is good and energy level  Is normal. Stated concentration is good . Stated on Depression scale 0 , hopeless 0 and anxiety 0 .( low 0-10 high) Denies suicidal  homicidal ideations  .  No auditory hallucinations  No pain concerns . Appropriate ADL'S. Interacting with peers and staff.  A: Encourage patient participation with unit programming . Instruction  Given on  Medication , verbalize understanding. R: Voice no other concerns. Staff continue to monitor
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
## 2017-02-23 NOTE — Progress Notes (Signed)
[facility] MD Progress Note  02/23/2017 3:58 PM KENLEIGH TOBACK  MRN:  [id number] Subjective:  Follow-up for Sunday the 30th. Patient says she is feeling better. She is much more lucid in her conversation. She denies any suicidal thoughts and denies any hallucinations. Understands her current situation and has been compliant with medicine. No new physical complaints. Blood sugars are a little elevated but only in the mid 100s. Vital signs stable. No new physical problems.  Follow-up for Tuesday the second. 61 year old woman with schizoaffective disorder. Patient says she is feeling better today. She says that she feels closer to her normal self but does not feel she is yet at baseline. She is feeling like she thinking more clearly. Denies any recent hallucinations. No suicidal or homicidal thoughts. Seems to be able to engage in conversations without much distraction. Tegretol level IX.4 in the affective range. Principal Problem: Schizoaffective disorder, bipolar type (HCC) Diagnosis:   Patient Active Problem List   Diagnosis Date Noted  . Tardive dyskinesia [G24.01] 02/19/2017  . Schizoaffective disorder, bipolar type (HCC) [F25.0] 08/02/2016  . Dyslipidemia [E78.5] 07/31/2016  . Akathisia [G25.71] 07/31/2016  . Vitamin D deficiency [E55.9] 07/31/2016  . Diabetes mellitus type 2, diet-controlled (HCC) [E11.9] 04/11/2015   Total Time spent with patient: 30 minutes  Past Psychiatric History: Past history of recurrent episodes of psychosis as part of schizoaffective disorder.  Past Medical History:  Past Medical History:  Diagnosis Date  . Bipolar disorder (HCC)   . Chronic kidney disease   . Diabetes mellitus without complication (HCC)   . Hypercholesteremia   . Manic behavior (HCC)     Past Surgical History:  Procedure Laterality Date  . COLONOSCOPY WITH PROPOFOL N/A 03/11/2015   Procedure: COLONOSCOPY WITH PROPOFOL;  Surgeon: Wallace Cullens, MD;  Location: [facility] ENDOSCOPY;  Service:  Gastroenterology;  Laterality: N/A;   Family History:  Family History  Problem Relation Age of Onset  . Diabetes Father   . Diabetes Sister    Family Psychiatric  History: Positive Social History:  History  Alcohol Use  . 1.2 oz/week  . 2 Glasses of wine per week    Comment: Occ     History  Drug Use No    Social History   Social History  . Marital status: Married    Spouse name: N/A  . Number of children: N/A  . Years of education: N/A   Social History Main Topics  . Smoking status: Never Smoker  . Smokeless tobacco: Never Used  . Alcohol use 1.2 oz/week    2 Glasses of wine per week     Comment: Occ  . Drug use: No  . Sexual activity: No   Other Topics Concern  . None   Social History Narrative  . None   Additional Social History:    History of alcohol / drug use?: Yes Negative Consequences of Use: Personal relationships Withdrawal Symptoms:  (none)                    Sleep: Fair  Appetite:  Fair  Current Medications: Current Facility-Administered Medications  Medication Dose Route Frequency Provider Last Rate Last Dose  . acetaminophen (TYLENOL) tablet 650 mg  650 mg Oral Q6H PRN Clapacs, John T, MD      . alum & mag hydroxide-simeth (MAALOX/MYLANTA) 200-200-20 MG/5ML suspension 30 mL  30 mL Oral Q4H PRN Clapacs, John T, MD      . carbamazepine (TEGRETOL) tablet 400 mg  400 mg  Oral BID PC Clapacs, John T, MD   400 mg at 02/23/17 0810  . hydrOXYzine (ATARAX/VISTARIL) tablet 50 mg  50 mg Oral TID PRN Clapacs, John T, MD      . insulin aspart (novoLOG) injection 0-15 Units  0-15 Units Subcutaneous TID WC Clapacs, Jackquline Denmark, MD   3 Units at 02/23/17 1147  . LORazepam (ATIVAN) tablet 2 mg  2 mg Oral Q4H PRN Clapacs, John T, MD      . magnesium hydroxide (MILK OF MAGNESIA) suspension 30 mL  30 mL Oral Daily PRN Clapacs, John T, MD      . metFORMIN (GLUCOPHAGE) tablet 500 mg  500 mg Oral BID WC Clapacs, Jackquline Denmark, MD   500 mg at 02/23/17 0810  .  propranolol (INDERAL) tablet 20 mg  20 mg Oral BID Clapacs, Jackquline Denmark, MD   20 mg at 02/23/17 1610  . rosuvastatin (CRESTOR) tablet 40 mg  40 mg Oral q1800 Clapacs, Jackquline Denmark, MD   40 mg at 02/22/17 1708  . traZODone (DESYREL) tablet 100 mg  100 mg Oral QHS PRN Clapacs, Jackquline Denmark, MD   100 mg at 02/19/17 2225  . ziprasidone (GEODON) capsule 80 mg  80 mg Oral BID WC Clapacs, Jackquline Denmark, MD   80 mg at 02/23/17 0810    Lab Results:  Results for orders placed or performed during the hospital encounter of 02/19/17 (from the past 48 hour(s))  Glucose, capillary     Status: Abnormal   Collection Time: 02/21/17  4:25 PM  Result Value Ref Range   Glucose-Capillary 120 (H) 65 - 99 mg/dL  Glucose, capillary     Status: Abnormal   Collection Time: 02/21/17  8:26 PM  Result Value Ref Range   Glucose-Capillary 142 (H) 65 - 99 mg/dL  Glucose, capillary     Status: Abnormal   Collection Time: 02/22/17  6:47 AM  Result Value Ref Range   Glucose-Capillary 159 (H) 65 - 99 mg/dL  Glucose, capillary     Status: Abnormal   Collection Time: 02/22/17 11:24 AM  Result Value Ref Range   Glucose-Capillary 210 (H) 65 - 99 mg/dL  Glucose, capillary     Status: Abnormal   Collection Time: 02/22/17  4:41 PM  Result Value Ref Range   Glucose-Capillary 109 (H) 65 - 99 mg/dL   Comment 1 Notify RN   Carbamazepine level, total     Status: None   Collection Time: 02/23/17  6:26 AM  Result Value Ref Range   Carbamazepine Lvl 9.2 4.0 - 12.0 ug/mL  Glucose, capillary     Status: Abnormal   Collection Time: 02/23/17  7:02 AM  Result Value Ref Range   Glucose-Capillary 157 (H) 65 - 99 mg/dL  Glucose, capillary     Status: Abnormal   Collection Time: 02/23/17 11:37 AM  Result Value Ref Range   Glucose-Capillary 172 (H) 65 - 99 mg/dL    Blood Alcohol level:  Lab Results  Component Value Date   ETH <10 02/18/2017   ETH <5 07/29/2016    Metabolic Disorder Labs: Lab Results  Component Value Date   HGBA1C 6.3 (H) 02/20/2017    MPG 134.11 02/20/2017   MPG 177 07/31/2016   Lab Results  Component Value Date   PROLACTIN 23.3 02/20/2017   PROLACTIN 3.6 (L) 07/31/2016   Lab Results  Component Value Date   CHOL 220 (H) 02/20/2017   TRIG 132 02/20/2017   HDL 63 02/20/2017   CHOLHDL 3.5  02/20/2017   VLDL 26 02/20/2017   LDLCALC 131 (H) 02/20/2017   LDLCALC 101 (H) 07/31/2016    Physical Findings: AIMS: Facial and Oral Movements Muscles of Facial Expression: Mild Lips and Perioral Area: Minimal Jaw: Minimal Tongue: None, normal,Extremity Movements Upper (arms, wrists, hands, fingers): Minimal Lower (legs, knees, ankles, toes): None, normal, Trunk Movements Neck, shoulders, hips: None, normal, Overall Severity Severity of abnormal movements (highest score from questions above): Minimal Incapacitation due to abnormal movements: None, normal Patient's awareness of abnormal movements (rate only patient's report): Aware, no distress, Dental Status Current problems with teeth and/or dentures?: No Does patient usually wear dentures?: No  CIWA:  CIWA-Ar Total: 0 COWS:     Musculoskeletal: Strength & Muscle Tone: within normal limits Gait & Station: normal Patient leans: N/A  Psychiatric Specialty Exam: Physical Exam  Nursing note and vitals reviewed. Constitutional: She appears well-developed and well-nourished.  HENT:  Head: Normocephalic and atraumatic.  Eyes: Pupils are equal, round, and reactive to light. Conjunctivae are normal.  Neck: Normal range of motion.  Cardiovascular: Regular rhythm and normal heart sounds.   Respiratory: Effort normal. No respiratory distress.  GI: Soft.  Musculoskeletal: Normal range of motion.  Neurological: She is alert.  Much less abnormal movement than yesterday  Skin: Skin is warm and dry.  Psychiatric: Judgment normal. Her affect is blunt. Her speech is delayed. She is slowed. Thought content is not paranoid. She expresses no homicidal and no suicidal ideation.  She exhibits abnormal recent memory.    Review of Systems  Constitutional: Negative.   HENT: Negative.   Eyes: Negative.   Respiratory: Negative.   Cardiovascular: Negative.   Gastrointestinal: Negative.   Musculoskeletal: Negative.   Skin: Negative.   Neurological: Negative.   Psychiatric/Behavioral: Positive for memory loss. Negative for depression, hallucinations, substance abuse and suicidal ideas. The patient is not nervous/anxious and does not have insomnia.     Blood pressure 138/72, pulse 77, temperature 98.7 F (37.1 C), temperature source Oral, resp. rate 18, SpO2 98 %.There is no height or weight on file to calculate BMI.  General Appearance: Disheveled  Eye Contact:  Fair  Speech:  Slow  Volume:  Decreased  Mood:  Euthymic  Affect:  Constricted  Thought Process:  Coherent  Orientation:  Full (Time, Place, and Person)  Thought Content:  Rumination and Tangential  Suicidal Thoughts:  No  Homicidal Thoughts:  No  Memory:  Immediate;   Fair Recent;   Fair Remote;   Fair  Judgement:  Fair  Insight:  Fair  Psychomotor Activity:  Shuffling Gait  Concentration:  Concentration: Fair  Recall:  [employer] of Knowledge:  Fair  Language:  Fair  Akathisia:  No  Handed:  Right  AIMS (if indicated):     Assets:  Communication Skills Desire for Improvement Financial Resources/Insurance Housing Resilience Social Support  ADL's:  Intact  Cognition:  Impaired,  Mild  Sleep:  Number of Hours: 7     Treatment Plan Summary: Daily contact with patient to assess and evaluate symptoms and progress in treatment, Medication management and Plan I tried to call her husband but there was no answer. We will try to stay in touch with him and get his input on how she is doing. No change to medicine today. Encourage her continued attendance at groups. Sugars seem fairly stable.  Mordecai Rasmussen, MD 02/23/2017, 3:58 PM

## 2017-02-24 LAB — GLUCOSE, CAPILLARY
GLUCOSE-CAPILLARY: 224 mg/dL — AB (ref 65–99)
GLUCOSE-CAPILLARY: 134 mg/dL — AB (ref 65–99)
Glucose-Capillary: 156 mg/dL — ABNORMAL HIGH (ref 65–99)

## 2017-02-24 NOTE — Progress Notes (Signed)
Patient ID: Rita Sullivan, female   DOB: [birth year], 61 y.o.   MRN: [id number] Less isolative to room, visible in the day room with peers but with limited interactions, slightly disheveled, CBG=143 @ Bedtime, denied pain, denied SI/HI/AVH.

## 2017-02-24 NOTE — Progress Notes (Signed)
Received Rita Sullivan this am after breakfast. She was compliant with her medications. She attended the am group therapy session and OOB in the milieu at intervals. She rated her depression 4/10 and anxiety 2/10. She denied feeling suicidal. She does endorse hearing random voices at intervals today.

## 2017-02-24 NOTE — Plan of Care (Signed)
Problem: Activity: Goal: Sleeping patterns will improve Outcome: Progressing Patient slept for Estimated Hours of 7.45; Precautionary checks every 15 minutes for safety maintained, room free of safety hazards, patient sustains no injury or falls during this shift.     

## 2017-02-24 NOTE — Plan of Care (Deleted)
Problem: Education: Goal: Utilization of techniques to improve thought processes will improve Outcome: Not Met (add Reason) New admit Goal: Knowledge of the prescribed therapeutic regimen will improve Outcome: Not Met (add Reason) New admit  Problem: Coping: Goal: Ability to cope will improve Outcome: Not Met (add Reason) New Admit Goal: Ability to verbalize feelings will improve Outcome: Not Met (add Reason) New admit

## 2017-02-24 NOTE — [facility] Group Notes (Signed)
[facility] Group Notes:  (Nursing/MHT/Case Management/Adjunct)  Date:  02/24/2017  Time:  4:58 PM  Type of Therapy:  Psychoeducational Skills  Participation Level:  Active  Participation Quality:  Appropriate  Affect:  Appropriate  Cognitive:  Appropriate  Insight:  Appropriate  Engagement in Group:  Engaged  Modes of Intervention:  Discussion, Education, Socialization and Support  Summary of Progress/Problems:  Rita Sullivan Rita Sullivan 02/24/2017, 4:58 PM 

## 2017-02-24 NOTE — Plan of Care (Signed)
Problem: Activity: Goal: Interest or engagement in activities will improve Outcome: Progressing Jonette attended the morning group therapy session.  Problem: Education: Goal: Emotional status will improve Dawnya stated her depression is a 4/10 and anxiety 2/10 today thus far.

## 2017-02-24 NOTE — Progress Notes (Signed)
Hali behavior remains bizzare, she wondered into another patient's room stating she was looking for her husband, unable to understand patient's thoughts process. She was found naked standing in front of the window, writer instructed her to return to the room after the other patient found her in his room. Writer educated patient  that he husband is not on the unit and she should not go on other hall. Given PRN trazodone. Charge nurse was notified and also talked to the patient. She is currently in bed resting at this time.

## 2017-02-24 NOTE — Progress Notes (Signed)
[facility] MD Progress Note  02/24/2017 9:12 PM AEDYN Sullivan  MRN:  [id number] Subjective:  Follow-up for Sunday the 30th. Patient says she is feeling better. She is much more lucid in her conversation. She denies any suicidal thoughts and denies any hallucinations. Understands her current situation and has been compliant with medicine. No new physical complaints. Blood sugars are a little elevated but only in the mid 100s. Vital signs stable. No new physical problems.  Follow-up for Tuesday the second. 61 year old woman with schizoaffective disorder. Patient says she is feeling better today. She says that she feels closer to her normal self but does not feel she is yet at baseline. She is feeling like she thinking more clearly. Denies any recent hallucinations. No suicidal or homicidal thoughts. Seems to be able to engage in conversations without much distraction. Tegretol level IX.4 in the affective range.  Follow-up for Wednesday the third. No new complaints. Patient says she is feeling pretty much back to normal. She is denying any hallucinations. Denies any feeling of being "manic". Says her mood feels pretty good. She still has a somewhat blunted and odd affect. She is keeping her hygiene better however. No sign of acute dangerousness. Principal Problem: Schizoaffective disorder, bipolar type (HCC) Diagnosis:   Patient Active Problem List   Diagnosis Date Noted  . Tardive dyskinesia [G24.01] 02/19/2017  . Schizoaffective disorder, bipolar type (HCC) [F25.0] 08/02/2016  . Dyslipidemia [E78.5] 07/31/2016  . Akathisia [G25.71] 07/31/2016  . Vitamin D deficiency [E55.9] 07/31/2016  . Diabetes mellitus type 2, diet-controlled (HCC) [E11.9] 04/11/2015   Total Time spent with patient: 30 minutes  Past Psychiatric History: Past history of recurrent episodes of psychosis as part of schizoaffective disorder.  Past Medical History:  Past Medical History:  Diagnosis Date  . Bipolar disorder (HCC)   .  Chronic kidney disease   . Diabetes mellitus without complication (HCC)   . Hypercholesteremia   . Manic behavior (HCC)     Past Surgical History:  Procedure Laterality Date  . COLONOSCOPY WITH PROPOFOL N/A 03/11/2015   Procedure: COLONOSCOPY WITH PROPOFOL;  Surgeon: Wallace Cullens, MD;  Location: [facility] ENDOSCOPY;  Service: Gastroenterology;  Laterality: N/A;   Family History:  Family History  Problem Relation Age of Onset  . Diabetes Father   . Diabetes Sister    Family Psychiatric  History: Positive Social History:  History  Alcohol Use  . 1.2 oz/week  . 2 Glasses of wine per week    Comment: Occ     History  Drug Use No    Social History   Social History  . Marital status: Married    Spouse name: N/A  . Number of children: N/A  . Years of education: N/A   Social History Main Topics  . Smoking status: Never Smoker  . Smokeless tobacco: Never Used  . Alcohol use 1.2 oz/week    2 Glasses of wine per week     Comment: Occ  . Drug use: No  . Sexual activity: No   Other Topics Concern  . None   Social History Narrative  . None   Additional Social History:    History of alcohol / drug use?: Yes Negative Consequences of Use: Personal relationships Withdrawal Symptoms:  (none)                    Sleep: Fair  Appetite:  Fair  Current Medications: Current Facility-Administered Medications  Medication Dose Route Frequency Provider Last Rate Last Dose  .  acetaminophen (TYLENOL) tablet 650 mg  650 mg Oral Q6H PRN Clapacs, John T, MD      . alum & mag hydroxide-simeth (MAALOX/MYLANTA) 200-200-20 MG/5ML suspension 30 mL  30 mL Oral Q4H PRN Clapacs, John T, MD      . carbamazepine (TEGRETOL) tablet 400 mg  400 mg Oral BID PC Clapacs, John T, MD   400 mg at 02/24/17 1714  . hydrOXYzine (ATARAX/VISTARIL) tablet 50 mg  50 mg Oral TID PRN Clapacs, John T, MD      . insulin aspart (novoLOG) injection 0-15 Units  0-15 Units Subcutaneous TID WC Clapacs, Jackquline Denmark, MD   2  Units at 02/24/17 1718  . LORazepam (ATIVAN) tablet 2 mg  2 mg Oral Q4H PRN Clapacs, John T, MD      . magnesium hydroxide (MILK OF MAGNESIA) suspension 30 mL  30 mL Oral Daily PRN Clapacs, John T, MD      . metFORMIN (GLUCOPHAGE) tablet 500 mg  500 mg Oral BID WC Clapacs, Jackquline Denmark, MD   500 mg at 02/24/17 1715  . propranolol (INDERAL) tablet 20 mg  20 mg Oral BID Clapacs, Jackquline Denmark, MD   20 mg at 02/24/17 1715  . rosuvastatin (CRESTOR) tablet 40 mg  40 mg Oral q1800 Clapacs, Jackquline Denmark, MD   40 mg at 02/24/17 1714  . traZODone (DESYREL) tablet 100 mg  100 mg Oral QHS PRN Clapacs, Jackquline Denmark, MD   100 mg at 02/19/17 2225  . ziprasidone (GEODON) capsule 80 mg  80 mg Oral BID WC Clapacs, Jackquline Denmark, MD   80 mg at 02/24/17 1714    Lab Results:  Results for orders placed or performed during the hospital encounter of 02/19/17 (from the past 48 hour(s))  Carbamazepine level, total     Status: None   Collection Time: 02/23/17  6:26 AM  Result Value Ref Range   Carbamazepine Lvl 9.2 4.0 - 12.0 ug/mL  Glucose, capillary     Status: Abnormal   Collection Time: 02/23/17  7:02 AM  Result Value Ref Range   Glucose-Capillary 157 (H) 65 - 99 mg/dL  Glucose, capillary     Status: Abnormal   Collection Time: 02/23/17 11:37 AM  Result Value Ref Range   Glucose-Capillary 172 (H) 65 - 99 mg/dL  Glucose, capillary     Status: Abnormal   Collection Time: 02/23/17  4:30 PM  Result Value Ref Range   Glucose-Capillary 145 (H) 65 - 99 mg/dL   Comment 1 Document in Chart   Glucose, capillary     Status: Abnormal   Collection Time: 02/23/17  8:11 PM  Result Value Ref Range   Glucose-Capillary 143 (H) 65 - 99 mg/dL   Comment 1 Notify RN   Glucose, capillary     Status: Abnormal   Collection Time: 02/24/17  7:02 AM  Result Value Ref Range   Glucose-Capillary 156 (H) 65 - 99 mg/dL   Comment 1 Notify RN   Glucose, capillary     Status: Abnormal   Collection Time: 02/24/17 11:48 AM  Result Value Ref Range    Glucose-Capillary 224 (H) 65 - 99 mg/dL  Glucose, capillary     Status: Abnormal   Collection Time: 02/24/17  4:30 PM  Result Value Ref Range   Glucose-Capillary 134 (H) 65 - 99 mg/dL    Blood Alcohol level:  Lab Results  Component Value Date   ETH <10 02/18/2017   ETH <5 07/29/2016    Metabolic Disorder  Labs: Lab Results  Component Value Date   HGBA1C 6.3 (H) 02/20/2017   MPG 134.11 02/20/2017   MPG 177 07/31/2016   Lab Results  Component Value Date   PROLACTIN 23.3 02/20/2017   PROLACTIN 3.6 (L) 07/31/2016   Lab Results  Component Value Date   CHOL 220 (H) 02/20/2017   TRIG 132 02/20/2017   HDL 63 02/20/2017   CHOLHDL 3.5 02/20/2017   VLDL 26 02/20/2017   LDLCALC 131 (H) 02/20/2017   LDLCALC 101 (H) 07/31/2016    Physical Findings: AIMS: Facial and Oral Movements Muscles of Facial Expression: Mild Lips and Perioral Area: Minimal Jaw: Minimal Tongue: None, normal,Extremity Movements Upper (arms, wrists, hands, fingers): Minimal Lower (legs, knees, ankles, toes): None, normal, Trunk Movements Neck, shoulders, hips: None, normal, Overall Severity Severity of abnormal movements (highest score from questions above): Minimal Incapacitation due to abnormal movements: None, normal Patient's awareness of abnormal movements (rate only patient's report): Aware, no distress, Dental Status Current problems with teeth and/or dentures?: No Does patient usually wear dentures?: No  CIWA:  CIWA-Ar Total: 0 COWS:     Musculoskeletal: Strength & Muscle Tone: within normal limits Gait & Station: normal Patient leans: N/A  Psychiatric Specialty Exam: Physical Exam  Nursing note and vitals reviewed. Constitutional: She appears well-developed and well-nourished.  HENT:  Head: Normocephalic and atraumatic.  Eyes: Pupils are equal, round, and reactive to light. Conjunctivae are normal.  Neck: Normal range of motion.  Cardiovascular: Regular rhythm and normal heart sounds.    Respiratory: Effort normal. No respiratory distress.  GI: Soft.  Musculoskeletal: Normal range of motion.  Neurological: She is alert.  Much less abnormal movement than yesterday  Skin: Skin is warm and dry.  Psychiatric: Judgment normal. Her affect is blunt. Her speech is delayed. She is slowed. Thought content is not paranoid. She expresses no homicidal and no suicidal ideation. She exhibits abnormal recent memory.    Review of Systems  Constitutional: Negative.   HENT: Negative.   Eyes: Negative.   Respiratory: Negative.   Cardiovascular: Negative.   Gastrointestinal: Negative.   Musculoskeletal: Negative.   Skin: Negative.   Neurological: Negative.   Psychiatric/Behavioral: Positive for memory loss. Negative for depression, hallucinations, substance abuse and suicidal ideas. The patient is not nervous/anxious and does not have insomnia.     Blood pressure 112/84, pulse 73, temperature 98.7 F (37.1 C), temperature source Oral, resp. rate 18, SpO2 98 %.There is no height or weight on file to calculate BMI.  General Appearance: Disheveled  Eye Contact:  Fair  Speech:  Slow  Volume:  Decreased  Mood:  Euthymic  Affect:  Constricted  Thought Process:  Coherent  Orientation:  Full (Time, Place, and Person)  Thought Content:  Rumination and Tangential  Suicidal Thoughts:  No  Homicidal Thoughts:  No  Memory:  Immediate;   Fair Recent;   Fair Remote;   Fair  Judgement:  Fair  Insight:  Fair  Psychomotor Activity:  Shuffling Gait  Concentration:  Concentration: Fair  Recall:  [employer] of Knowledge:  Fair  Language:  Fair  Akathisia:  No  Handed:  Right  AIMS (if indicated):     Assets:  Communication Skills Desire for Improvement Financial Resources/Insurance Housing Resilience Social Support  ADL's:  Intact  Cognition:  Impaired,  Mild  Sleep:  Number of Hours: 7.45     Treatment Plan Summary: Daily contact with patient to assess and evaluate symptoms and  progress in treatment, Medication management  and Plan Patient appears to be significantly improved. No need for medicine change. Reviewed plan with patient and social work. Anticipate likely discharge tomorrow.  Mordecai Rasmussen, MD 02/24/2017, 9:12 PM

## 2017-02-25 LAB — GLUCOSE, CAPILLARY
GLUCOSE-CAPILLARY: 177 mg/dL — AB (ref 65–99)
GLUCOSE-CAPILLARY: 297 mg/dL — AB (ref 65–99)
Glucose-Capillary: 209 mg/dL — ABNORMAL HIGH (ref 65–99)

## 2017-02-25 MED ORDER — CARBAMAZEPINE 200 MG PO TABS: 400.0000 mg | ORAL_TABLET | Freq: Two times a day (BID) | ORAL | 0 refills | Status: DC

## 2017-02-25 MED ORDER — ROSUVASTATIN CALCIUM 40 MG PO TABS: 40.0000 mg | ORAL_TABLET | Freq: Every day | ORAL | 0 refills | Status: DC

## 2017-02-25 MED ORDER — PROPRANOLOL HCL 20 MG PO TABS: 20.0000 mg | ORAL_TABLET | Freq: Two times a day (BID) | ORAL | 0 refills | Status: DC

## 2017-02-25 MED ORDER — ZIPRASIDONE HCL 80 MG PO CAPS: 80.0000 mg | ORAL_CAPSULE | Freq: Two times a day (BID) | ORAL | 0 refills | Status: DC

## 2017-02-25 MED ORDER — METFORMIN HCL 500 MG PO TABS: 500.0000 mg | ORAL_TABLET | Freq: Two times a day (BID) | ORAL | 0 refills | Status: DC

## 2017-02-25 MED ORDER — TRAZODONE HCL 100 MG PO TABS: 100.0000 mg | ORAL_TABLET | Freq: Every evening | ORAL | 0 refills | Status: DC | PRN

## 2017-02-25 NOTE — [facility] Group Notes (Signed)
Goals Group Date/Time: 02/25/2017 9:00 AM Type of Therapy and Topic: Group Therapy: Goals Group: SMART Goals   Participation Level: Moderate  Description of Group:    The purpose of a daily goals group is to assist and guide patients in setting recovery/wellness-related goals. The objective is to set goals as they relate to the crisis in which they were admitted. Patients will be using SMART goal modalities to set measurable goals. Characteristics of realistic goals will be discussed and patients will be assisted in setting and processing how one will reach their goal. Facilitator will also assist patients in applying interventions and coping skills learned in psycho-education groups to the SMART goal and process how one will achieve defined goal.   Therapeutic Goals:   -Patients will develop and document one goal related to or their crisis in which brought them into treatment.  -Patients will be guided by LCSW using SMART goal setting modality in how to set a measurable, attainable, realistic and time sensitive goal.  -Patients will process barriers in reaching goal.  -Patients will process interventions in how to overcome and successful in reaching goal.   Patient's Goal: to do additional puzzells and to learn something new.   Therapeutic Modalities:  Motivational Interviewing  Research officer, political party  SMART goals setting   [name], [place]

## 2017-02-25 NOTE — Progress Notes (Signed)
Patient denies si, hi,a vh. Denies pain. She is pleasant and cooperative. She is med compliant. She states she slept well. She appears clear and answers appropriately. She signed her discharge papers and states she understands Instructions. She is to call her spouse for pickup.

## 2017-02-25 NOTE — Progress Notes (Signed)
Patient discharged to spouse. Went over paperwork with him and appointments. He verbalized understanding of meds.

## 2017-02-25 NOTE — Progress Notes (Signed)
  [facility] Adult Case Management Discharge Plan :  Will you be returning to the same living situation after discharge:  Yes,  return home At discharge, do you have transportation home?: Yes,  spouse Do you have the ability to pay for your medications: Yes,  no concerns expressed  Release of information consent forms completed and in the chart;  Patient's signature needed at discharge.  Patient to Follow up at: Follow-up Information    [facility], [place] Follow up on 03/08/2017.   Why:  Hospital discharge follow up for medications management on 10/15 at 3:40 PM.  You have been placed on a cancellation list for an earlier appointment if possible. Contact information: [address] [place] [address] [phone]           Next level of care provider has access to [facility] Link:no  Safety Planning and Suicide Prevention discussed: Yes,  w spouse  Have you used any form of tobacco in the last 30 days? (Cigarettes, Smokeless Tobacco, Cigars, and/or Pipes): No  Has patient been referred to the Quitline?: N/A patient is not a smoker  Patient has been referred for addiction treatment: Yes  Sallee Lange, LCSW 02/25/2017, 12:03 PM

## 2017-02-25 NOTE — Progress Notes (Signed)
  [facility] Adult Case Management Discharge Plan :  Will you be returning to the same living situation after discharge:  Yes,  with husband At discharge, do you have transportation home?: Yes,  husband Do you have the ability to pay for your medications: Yes,  [employer] medicare  Release of information consent forms completed and in the chart;  Patient's signature needed at discharge.  Patient to Follow up at: Follow-up Information    [facility], [place] Follow up on 03/08/2017.   Why:  Hospital discharge follow up for medications management on 10/15 at 3:40 PM.  You have been placed on a cancellation list for an earlier appointment if possible. Contact information: [address] [place] [address] [phone]           Next level of care provider has access to [facility] Link:no  Safety Planning and Suicide Prevention discussed: Yes,  husband  Have you used any form of tobacco in the last 30 days? (Cigarettes, Smokeless Tobacco, Cigars, and/or Pipes): No  Has patient been referred to the Quitline?: N/A patient is not a smoker  Patient has been referred for addiction treatment: Yes  Lorri Frederick, LCSW 02/25/2017, 11:29 AM

## 2017-02-25 NOTE — Plan of Care (Signed)
Problem: Education: Goal: Verbalization of understanding the information provided will improve Outcome: Progressing Patient states she is feeling much better

## 2017-02-25 NOTE — [facility] Group Notes (Signed)
[facility] LCSW Group Therapy Note  Date/Time: 02/25/17, 0930  Type of Therapy/Topic:  Group Therapy:  Balance in Life  Participation Level:  active  Description of Group:    This group will address the concept of balance and how it feels and looks when one is unbalanced. Patients will be encouraged to process areas in their lives that are out of balance, and identify reasons for remaining unbalanced. Facilitators will guide patients utilizing problem- solving interventions to address and correct the stressor making their life unbalanced. Understanding and applying boundaries will be explored and addressed for obtaining  and maintaining a balanced life. Patients will be encouraged to explore ways to assertively make their unbalanced needs known to significant others in their lives, using other group members and facilitator for support and feedback.  Therapeutic Goals: 1. Patient will identify two or more emotions or situations they have that consume much of in their lives. 2. Patient will identify signs/triggers that life has become out of balance:  3. Patient will identify two ways to set boundaries in order to achieve balance in their lives:  4. Patient will demonstrate ability to communicate their needs through discussion and/or role plays  Summary of Patient Progress:Pt identified work and physical as areas that are out of balance.  Pt made several comments as part of the group discussion today.          Therapeutic Modalities:   Cognitive Behavioral Therapy Solution-Focused Therapy Assertiveness Training  [name], [place]

## 2017-02-25 NOTE — [facility] Suicide Risk Assessment (Signed)
[facility] Discharge Suicide Risk Assessment   Principal Problem: Schizoaffective disorder, bipolar type [facility]) Discharge Diagnoses:  Patient Active Problem List   Diagnosis Date Noted  . Tardive dyskinesia [G24.01] 02/19/2017  . Schizoaffective disorder, bipolar type (HCC) [F25.0] 08/02/2016  . Dyslipidemia [E78.5] 07/31/2016  . Akathisia [G25.71] 07/31/2016  . Vitamin D deficiency [E55.9] 07/31/2016  . Diabetes mellitus type 2, diet-controlled (HCC) [E11.9] 04/11/2015    Total Time spent with patient: 45 minutes  Musculoskeletal: Strength & Muscle Tone: within normal limits Gait & Station: normal Patient leans: N/A  Psychiatric Specialty Exam: Review of Systems  Constitutional: Negative.   HENT: Negative.   Eyes: Negative.   Respiratory: Negative.   Cardiovascular: Negative.   Gastrointestinal: Negative.   Musculoskeletal: Negative.   Skin: Negative.   Neurological: Negative.   Psychiatric/Behavioral: Negative.     Blood pressure 117/82, pulse 80, temperature 97.8 F (36.6 C), resp. rate 18, SpO2 98 %.There is no height or weight on file to calculate BMI.  General Appearance: Fairly Groomed  Patent attorney::  Good  Speech:  Clear and Coherent409  Volume:  Normal  Mood:  Euthymic  Affect:  Constricted  Thought Process:  Goal Directed  Orientation:  Full (Time, Place, and Person)  Thought Content:  Logical  Suicidal Thoughts:  No  Homicidal Thoughts:  No  Memory:  Immediate;   Good Recent;   Fair Remote;   Fair  Judgement:  Fair  Insight:  Fair  Psychomotor Activity:  Normal  Concentration:  Fair  Recall:  [employer] of Knowledge:Fair  Language: Fair  Akathisia:  No  Handed:  Right  AIMS (if indicated):     Assets:  Desire for Improvement Financial Resources/Insurance Housing Social Support  Sleep:  Number of Hours: 7  Cognition: WNL  ADL's:  Intact   Mental Status Per Nursing Assessment::   On Admission:     Demographic Factors:  NA  Loss  Factors: NA  Historical Factors: NA  Risk Reduction Factors:   Sense of responsibility to family, Living with another person, especially a relative, Positive social support and Positive therapeutic relationship  Continued Clinical Symptoms:  Schizophrenia:   Paranoid or undifferentiated type  Cognitive Features That Contribute To Risk:  Polarized thinking    Suicide Risk:  Minimal: No identifiable suicidal ideation.  Patients presenting with no risk factors but with morbid ruminations; may be classified as minimal risk based on the severity of the depressive symptoms  Follow-up Information    [facility] Follow up on 03/08/2017.   Why:  Hospital discharge follow up for medications management on 10/15 at 3:40 PM.  You have been placed on a cancellation list for an earlier appointment if possible. Contact information: [address] [place] [address] [phone]           Plan Of Care/Follow-up recommendations:  Activity:  Activity as tolerated Diet:  Low-carb diet Other:  Follow-up with Dr. Arlan Organ, MD 02/25/2017, 11:28 AM

## 2017-02-26 NOTE — Discharge Summary (Signed)
Physician Discharge Summary Note  Patient:  Rita Sullivan is an 61 y.o., female MRN:  [id number] DOB:  04/27/1956 Patient phone:  [phone] (home)  Patient address:   [address] [place] [address],  Total Time spent with patient: 45 minutes  Date of Admission:  02/19/2017 Date of Discharge: 02/25/2017  Reason for Admission:  Patient was admitted to the emergency room with an exacerbation of psychosis. She was displaying hallucinations thought blocking disorganized thinking bizarre behavior. Patient was willing to be admitted and cooperative with treatment.  Principal Problem: Schizoaffective disorder, bipolar type [facility]) Discharge Diagnoses: Patient Active Problem List   Diagnosis Date Noted  . Tardive dyskinesia [G24.01] 02/19/2017  . Schizoaffective disorder, bipolar type (HCC) [F25.0] 08/02/2016  . Dyslipidemia [E78.5] 07/31/2016  . Akathisia [G25.71] 07/31/2016  . Vitamin D deficiency [E55.9] 07/31/2016  . Diabetes mellitus type 2, diet-controlled (HCC) [E11.9] 04/11/2015    Past Psychiatric History: Patient has a history of schizoaffective disorder with several prior admissions for psychotic decompensation.  Past Medical History:  Past Medical History:  Diagnosis Date  . Bipolar disorder (HCC)   . Chronic kidney disease   . Diabetes mellitus without complication (HCC)   . Hypercholesteremia   . Manic behavior (HCC)     Past Surgical History:  Procedure Laterality Date  . COLONOSCOPY WITH PROPOFOL N/A 03/11/2015   Procedure: COLONOSCOPY WITH PROPOFOL;  Surgeon: Wallace Cullens, MD;  Location: [facility] ENDOSCOPY;  Service: Gastroenterology;  Laterality: N/A;   Family History:  Family History  Problem Relation Age of Onset  . Diabetes Father   . Diabetes Sister    Family Psychiatric  History: None noted Social History:  History  Alcohol Use  . 1.2 oz/week  . 2 Glasses of wine per week    Comment: Occ     History  Drug Use No    Social History   Social  History  . Marital status: Married    Spouse name: N/A  . Number of children: N/A  . Years of education: N/A   Social History Main Topics  . Smoking status: Never Smoker  . Smokeless tobacco: Never Used  . Alcohol use 1.2 oz/week    2 Glasses of wine per week     Comment: Occ  . Drug use: No  . Sexual activity: No   Other Topics Concern  . None   Social History Narrative  . None    Hospital Course:  Patient was admitted to the psychiatric unit and had some adjustments in medication. Mostly however we kept her medicine about the same and left the Geodon dose at 80 mg twice a day. Made sure to get the doses given at mealtimes. Patient was included in groups activities and individual and group therapy. Showed improvement in symptoms with rapid improvement in psychotic thinking and behavior. Did not display any dangerous or violent behavior during hospital stay. By the time of discharge was expressing improvement in all of her symptoms felt comfortable with going home. Patient had outpatient treatment in place to follow-up in the community  Physical Findings: AIMS: Facial and Oral Movements Muscles of Facial Expression: Mild Lips and Perioral Area: Minimal Jaw: Minimal Tongue: None, normal,Extremity Movements Upper (arms, wrists, hands, fingers): Minimal Lower (legs, knees, ankles, toes): None, normal, Trunk Movements Neck, shoulders, hips: None, normal, Overall Severity Severity of abnormal movements (highest score from questions above): Minimal Incapacitation due to abnormal movements: None, normal Patient's awareness of abnormal movements (rate only patient's report): Aware, no  distress, Dental Status Current problems with teeth and/or dentures?: No Does patient usually wear dentures?: No  CIWA:  CIWA-Ar Total: 0 COWS:     Musculoskeletal: Strength & Muscle Tone: within normal limits Gait & Station: normal Patient leans: N/A  Psychiatric Specialty Exam: Physical Exam   Nursing note and vitals reviewed. Constitutional: She appears well-developed and well-nourished.  HENT:  Head: Normocephalic and atraumatic.  Eyes: Pupils are equal, round, and reactive to light. Conjunctivae are normal.  Neck: Normal range of motion.  Cardiovascular: Normal heart sounds.   Respiratory: Effort normal.  GI: Soft.  Musculoskeletal: Normal range of motion.  Neurological: She is alert.  Skin: Skin is warm and dry.  Psychiatric: Judgment normal. Her affect is blunt. Her speech is delayed. She is slowed. Thought content is not paranoid. Cognition and memory are normal. She expresses no homicidal and no suicidal ideation.    Review of Systems  Constitutional: Negative.   HENT: Negative.   Eyes: Negative.   Respiratory: Negative.   Cardiovascular: Negative.   Gastrointestinal: Negative.   Musculoskeletal: Negative.   Skin: Negative.   Neurological: Negative.   Psychiatric/Behavioral: Negative.     Blood pressure 117/82, pulse 80, temperature 97.8 F (36.6 C), resp. rate 18, SpO2 98 %.There is no height or weight on file to calculate BMI.  General Appearance: Casual  Eye Contact:  Fair  Speech:  Clear and Coherent  Volume:  Decreased  Mood:  Euthymic  Affect:  Congruent  Thought Process:  Goal Directed  Orientation:  Full (Time, Place, and Person)  Thought Content:  Logical  Suicidal Thoughts:  No  Homicidal Thoughts:  No  Memory:  Immediate;   Fair Recent;   Fair Remote;   Fair  Judgement:  Fair  Insight:  Fair  Psychomotor Activity:  Decreased  Concentration:  Concentration: Fair  Recall:  Fair  Fund of Knowledge:  Fair  Language:  Fair  Akathisia:  No  Handed:  Right  AIMS (if indicated):     Assets:  Desire for Improvement Housing Resilience  ADL's:  Intact  Cognition:  WNL  Sleep:  Number of Hours: 7     Have you used any form of tobacco in the last 30 days? (Cigarettes, Smokeless Tobacco, Cigars, and/or Pipes): No  Has this patient used  any form of tobacco in the last 30 days? (Cigarettes, Smokeless Tobacco, Cigars, and/or Pipes) Yes, No  Blood Alcohol level:  Lab Results  Component Value Date   ETH <10 02/18/2017   ETH <5 07/29/2016    Metabolic Disorder Labs:  Lab Results  Component Value Date   HGBA1C 6.3 (H) 02/20/2017   MPG 134.11 02/20/2017   MPG 177 07/31/2016   Lab Results  Component Value Date   PROLACTIN 23.3 02/20/2017   PROLACTIN 3.6 (L) 07/31/2016   Lab Results  Component Value Date   CHOL 220 (H) 02/20/2017   TRIG 132 02/20/2017   HDL 63 02/20/2017   CHOLHDL 3.5 02/20/2017   VLDL 26 02/20/2017   LDLCALC 131 (H) 02/20/2017   LDLCALC 101 (H) 07/31/2016    See Psychiatric Specialty Exam and Suicide Risk Assessment completed by Attending Physician prior to discharge.  Discharge destination:  Home  Is patient on multiple antipsychotic therapies at discharge:  No   Has Patient had three or more failed trials of antipsychotic monotherapy by history:  No  Recommended Plan for Multiple Antipsychotic Therapies: NA  Discharge Instructions    Diet - low sodium heart healthy  Complete by:  As directed    Increase activity slowly    Complete by:  As directed      Allergies as of 02/25/2017      Reactions   Beef-derived Products    Does not eat beef  Update: Pt states that she does eat beef   Divalproex Sodium    "bad for my liver"   Pork-derived Products    Does not eat pork Patient states that she does eat pork   Penicillins Rash      Medication List    STOP taking these medications   LORazepam 2 MG tablet Commonly known as:  ATIVAN     TAKE these medications     Indication  carbamazepine 200 MG tablet Commonly known as:  TEGRETOL Take 2 tablets (400 mg total) by mouth 2 (two) times daily after a meal. What changed:  when to take this  Indication:  Manic-Depression   metFORMIN 500 MG tablet Commonly known as:  GLUCOPHAGE Take 1 tablet (500 mg total) by mouth 2 (two)  times daily with a meal. What changed:  how much to take  Another medication with the same name was removed. Continue taking this medication, and follow the directions you see here.  Indication:  Type 2 Diabetes   propranolol 20 MG tablet Commonly known as:  INDERAL Take 1 tablet (20 mg total) by mouth 2 (two) times daily.  Indication:  Neuroleptic-Induced Akathisia   rosuvastatin 40 MG tablet Commonly known as:  CRESTOR Take 1 tablet (40 mg total) by mouth daily at 6 PM. What changed:  when to take this  Indication:  High Amount of Fats in the Blood   traZODone 100 MG tablet Commonly known as:  DESYREL Take 1 tablet (100 mg total) by mouth at bedtime as needed for sleep.  Indication:  Trouble Sleeping   ziprasidone 80 MG capsule Commonly known as:  GEODON Take 1 capsule (80 mg total) by mouth 2 (two) times daily with a meal.  Indication:  Manic-Depression      Follow-up Information    [facility] Follow up on 03/08/2017.   Why:  Hospital discharge follow up for medications management on 10/15 at 3:40 PM.  You have been placed on a cancellation list for an earlier appointment if possible. Contact information: [address] [place] [address] [phone]           Follow-up recommendations:  Activity:  Activity as tolerated Diet:  Heart healthy diet Other:  Follow-up in the community with outpatient provider  Comments: Continue current medicine. Outpatient treatment as Artie range change  Signed: Mordecai Rasmussen, MD 02/26/2017, 6:27 PM

## 2019-03-17 ENCOUNTER — Other Ambulatory Visit: Payer: Self-pay

## 2019-03-17 ENCOUNTER — Encounter: Payer: Self-pay | Admitting: Emergency Medicine

## 2019-03-17 DIAGNOSIS — F250 Schizoaffective disorder, bipolar type: Secondary | ICD-10-CM | POA: Diagnosis not present

## 2019-03-17 DIAGNOSIS — N189 Chronic kidney disease, unspecified: Secondary | ICD-10-CM | POA: Insufficient documentation

## 2019-03-17 DIAGNOSIS — Z008 Encounter for other general examination: Secondary | ICD-10-CM | POA: Insufficient documentation

## 2019-03-17 DIAGNOSIS — E1122 Type 2 diabetes mellitus with diabetic chronic kidney disease: Secondary | ICD-10-CM | POA: Diagnosis not present

## 2019-03-17 DIAGNOSIS — Z20828 Contact with and (suspected) exposure to other viral communicable diseases: Secondary | ICD-10-CM | POA: Diagnosis not present

## 2019-03-17 DIAGNOSIS — Z7984 Long term (current) use of oral hypoglycemic drugs: Secondary | ICD-10-CM | POA: Insufficient documentation

## 2019-03-17 DIAGNOSIS — Z79899 Other long term (current) drug therapy: Secondary | ICD-10-CM | POA: Insufficient documentation

## 2019-03-17 DIAGNOSIS — R440 Auditory hallucinations: Secondary | ICD-10-CM | POA: Diagnosis present

## 2019-03-17 LAB — CBC
HCT: 43 % (ref 36.0–46.0)
Hemoglobin: 14.6 g/dL (ref 12.0–15.0)
MCH: 31.9 pg (ref 26.0–34.0)
MCHC: 34 g/dL (ref 30.0–36.0)
MCV: 94.1 fL (ref 80.0–100.0)
Platelets: 284 10*3/uL (ref 150–400)
RBC: 4.57 MIL/uL (ref 3.87–5.11)
RDW: 11.9 % (ref 11.5–15.5)
WBC: 10.1 10*3/uL (ref 4.0–10.5)
nRBC: 0 % (ref 0.0–0.2)

## 2019-03-17 NOTE — ED Triage Notes (Signed)
Pt presents to triage room ambulatory with steady gait reports her husband brought her to ER "he said I am manic" Pt reports she is Bipolar reports her husband did not like the way pt approached him., Pt reports drinks wine every night, pt denies any SI or HI. Denies any visual or auditory hallucinations. Pt talks in complete sentences no distress noted.

## 2019-03-17 NOTE — ED Notes (Signed)
Dressed pt out. Belongings: watch, crocs, underwear, pants, shirt.

## 2019-03-18 ENCOUNTER — Emergency Department
Admission: EM | Admit: 2019-03-18 | Discharge: 2019-03-18 | Disposition: A | Discharge status: Other Acute Inpatient Hospital | Payer: Medicare HMO | Attending: Emergency Medicine | Admitting: Emergency Medicine

## 2019-03-18 DIAGNOSIS — F250 Schizoaffective disorder, bipolar type: Secondary | ICD-10-CM | POA: Diagnosis present

## 2019-03-18 LAB — ACETAMINOPHEN LEVEL: Acetaminophen (Tylenol), Serum: <10 ug/mL — ABNORMAL LOW (ref 10–30)

## 2019-03-18 LAB — COMPREHENSIVE METABOLIC PANEL
ALT: 21 U/L (ref 0–44)
AST: 21 U/L (ref 15–41)
Albumin: 4.6 g/dL (ref 3.5–5.0)
Alkaline Phosphatase: 74 U/L (ref 38–126)
Anion gap: 13 (ref 5–15)
BUN: 10 mg/dL (ref 8–23)
CO2: 20 mmol/L — ABNORMAL LOW (ref 22–32)
Calcium: 10 mg/dL (ref 8.9–10.3)
Chloride: 104 mmol/L (ref 98–111)
Creatinine, Ser: 0.71 mg/dL (ref 0.44–1.00)
GFR calc Af Amer: >60 mL/min (ref >60)
GFR calc non Af Amer: >60 mL/min (ref >60)
Glucose, Bld: 142 mg/dL — ABNORMAL HIGH (ref 70–99)
Potassium: 4.3 mmol/L (ref 3.5–5.1)
Sodium: 137 mmol/L (ref 135–145)
Total Bilirubin: 0.4 mg/dL (ref 0.3–1.2)
Total Protein: 7.5 g/dL (ref 6.5–8.1)

## 2019-03-18 LAB — URINE DRUG SCREEN, QUALITATIVE (ARMC ONLY)
Amphetamines, Ur Screen: NOT DETECTED
Barbiturates, Ur Screen: NOT DETECTED
Benzodiazepine, Ur Scrn: NOT DETECTED
Cannabinoid 50 Ng, Ur ~~LOC~~: NOT DETECTED
Cocaine Metabolite,Ur ~~LOC~~: NOT DETECTED
MDMA (Ecstasy)Ur Screen: NOT DETECTED
Methadone Scn, Ur: NOT DETECTED
Opiate, Ur Screen: NOT DETECTED
Phencyclidine (PCP) Ur S: NOT DETECTED
Tricyclic, Ur Screen: NOT DETECTED

## 2019-03-18 LAB — ETHANOL: Alcohol, Ethyl (B): 33 mg/dL — ABNORMAL HIGH (ref <10)

## 2019-03-18 LAB — SARS CORONAVIRUS 2 BY RT PCR (HOSPITAL ORDER, PERFORMED IN ~~LOC~~ HOSPITAL LAB): SARS Coronavirus 2: NEGATIVE

## 2019-03-18 LAB — SALICYLATE LEVEL: Salicylate Lvl: <7 mg/dL (ref 2.8–30.0)

## 2019-03-18 MED ORDER — VITAMIN B-1 100 MG PO TABS: 100.0000 mg | ORAL_TABLET | Freq: Every day | ORAL | Status: DC

## 2019-03-18 MED ORDER — ROSUVASTATIN CALCIUM 20 MG PO TABS
40.0000 mg | ORAL_TABLET | Freq: Every day | ORAL | Status: DC
  Filled 2019-03-18: qty 2

## 2019-03-18 MED ORDER — CARBAMAZEPINE 200 MG PO TABS: 400.0000 mg | ORAL_TABLET | Freq: Two times a day (BID) | ORAL | Status: DC

## 2019-03-18 MED ORDER — LORAZEPAM 2 MG PO TABS
0.0000 mg | ORAL_TABLET | Freq: Four times a day (QID) | ORAL | Status: DC
  Administered 2019-03-18: 2 mg via ORAL
  Filled 2019-03-18: qty 1

## 2019-03-18 MED ORDER — TRAZODONE HCL 100 MG PO TABS: 100.0000 mg | ORAL_TABLET | Freq: Every evening | ORAL | Status: DC | PRN

## 2019-03-18 MED ORDER — ZIPRASIDONE HCL 20 MG PO CAPS: 80.0000 mg | ORAL_CAPSULE | Freq: Two times a day (BID) | ORAL | Status: DC

## 2019-03-18 MED ORDER — METFORMIN HCL 500 MG PO TABS: 500.0000 mg | ORAL_TABLET | Freq: Two times a day (BID) | ORAL | Status: DC

## 2019-03-18 NOTE — BH Assessment (Signed)
Writer received return phone call from patient's husband (Ruperto-[phone]) and updated him about patient's disposition.

## 2019-03-18 NOTE — ED Notes (Signed)
Pt given breakfast tray and cup of water

## 2019-03-18 NOTE — ED Provider Notes (Signed)
[facility] Emergency Department Provider Note   ____________________________________________   First MD Initiated Contact with Patient 03/18/19 0028     (approximate)  I have reviewed the triage vital signs and the nursing notes.   HISTORY  Chief Complaint Psychiatric Evaluation    HPI Rita Sullivan is a 63 y.o. female brought to the ED from home by her husband for mania.  Patient has a history of schizoaffective disorder, bipolar type.  States her husband thought she was manic and did not like the way that she approached him this evening.  Patient is also a daily wine drinker.  Currently denies active SI/HI/AH/VH.  Reports compliance with medications.  Voices no medical complaints currently.       Past Medical History:  Diagnosis Date  . Bipolar disorder ([place])   . Chronic kidney disease   . Diabetes mellitus without complication ([place])   . Hypercholesteremia   . Manic behavior ([place])     Patient Active Problem List   Diagnosis Date Noted  . Tardive dyskinesia 02/19/2017  . Schizoaffective disorder, bipolar type ([place]) 08/02/2016  . Dyslipidemia 07/31/2016  . Akathisia 07/31/2016  . Vitamin D deficiency 07/31/2016  . Diabetes mellitus type 2, diet-controlled ([place]) 04/11/2015    Past Surgical History:  Procedure Laterality Date  . COLONOSCOPY WITH PROPOFOL N/A 03/11/2015   Procedure: COLONOSCOPY WITH PROPOFOL;  Surgeon: Hulen Luster, MD;  Location: [facility] ENDOSCOPY;  Service: Gastroenterology;  Laterality: N/A;    Prior to Admission medications   Medication Sig Start Date End Date Taking? Authorizing Provider  carbamazepine (TEGRETOL) 200 MG tablet Take 2 tablets (400 mg total) by mouth 2 (two) times daily after a meal. 02/25/17   Clapacs, Madie Reno, MD  metFORMIN (GLUCOPHAGE) 500 MG tablet Take 1 tablet (500 mg total) by mouth 2 (two) times daily with a meal. 02/25/17   Clapacs, Madie Reno, MD  propranolol (INDERAL) 20 MG tablet Take 1 tablet (20 mg  total) by mouth 2 (two) times daily. 02/25/17   Clapacs, Madie Reno, MD  rosuvastatin (CRESTOR) 40 MG tablet Take 1 tablet (40 mg total) by mouth daily at 6 PM. 02/25/17   Clapacs, Madie Reno, MD  traZODone (DESYREL) 100 MG tablet Take 1 tablet (100 mg total) by mouth at bedtime as needed for sleep. 02/25/17   Clapacs, Madie Reno, MD  ziprasidone (GEODON) 80 MG capsule Take 1 capsule (80 mg total) by mouth 2 (two) times daily with a meal. 02/25/17   Clapacs, Madie Reno, MD    Allergies Beef-derived products, Divalproex sodium, Pork-derived products, and Penicillins  Family History  Problem Relation Age of Onset  . Diabetes Father   . Diabetes Sister     Social History Social History   Tobacco Use  . Smoking status: Never Smoker  . Smokeless tobacco: Never Used  Substance Use Topics  . Alcohol use: Yes    Alcohol/week: 2.0 standard drinks    Types: 2 Glasses of wine per week    Comment: Occ  . Drug use: No    Review of Systems  Constitutional: No fever/chills Eyes: No visual changes. ENT: No sore throat. Cardiovascular: Denies chest pain. Respiratory: Denies shortness of breath. Gastrointestinal: No abdominal pain.  No nausea, no vomiting.  No diarrhea.  No constipation. Genitourinary: Negative for dysuria. Musculoskeletal: Negative for back pain. Skin: Negative for rash. Neurological: Negative for headaches, focal weakness or numbness. Psychiatric: Positive for mania.  ____________________________________________   PHYSICAL EXAM:  VITAL SIGNS: ED  Triage Vitals  Enc Vitals Group     BP 03/17/19 2333 130/83     Pulse Rate 03/17/19 2333 78     Resp 03/17/19 2333 20     Temp 03/17/19 2333 98.9 F (37.2 C)     Temp Source 03/17/19 2333 Oral     SpO2 03/17/19 2333 99 %     Weight 03/17/19 2335 165 lb (74.8 kg)     Height 03/17/19 2335 5\' 2"  (1.575 m)     Head Circumference --      Peak Flow --      Pain Score 03/17/19 2334 0     Pain Loc --      Pain Edu? --      Excl. in GC?  --     Constitutional: Alert and oriented. Well appearing and in no acute distress. Eyes: Conjunctivae are normal. PERRL. EOMI. Head: Atraumatic. Nose: No congestion/rhinnorhea. Mouth/Throat: Mucous membranes are moist.  Oropharynx non-erythematous. Neck: No stridor.   Cardiovascular: Normal rate, regular rhythm. Grossly normal heart sounds.  Good peripheral circulation. Respiratory: Normal respiratory effort.  No retractions. Lungs CTAB. Gastrointestinal: Soft and nontender. No distention. No abdominal bruits. No CVA tenderness. Musculoskeletal: No lower extremity tenderness nor edema.  No joint effusions. Neurologic:  Normal speech and language. No gross focal neurologic deficits are appreciated. No gait instability. Skin:  Skin is warm, dry and intact. No rash noted. Psychiatric: Mood and affect are flat. Speech and behavior are normal.  ____________________________________________   LABS (all labs ordered are listed, but only abnormal results are displayed)  Labs Reviewed  COMPREHENSIVE METABOLIC PANEL - Abnormal; Notable for the following components:      Result Value   CO2 20 (*)    Glucose, Bld 142 (*)    All other components within normal limits  ETHANOL - Abnormal; Notable for the following components:   Alcohol, Ethyl (B) 33 (*)    All other components within normal limits  ACETAMINOPHEN LEVEL - Abnormal; Notable for the following components:   Acetaminophen (Tylenol), Serum <10 (*)    All other components within normal limits  SALICYLATE LEVEL  CBC  URINE DRUG SCREEN, QUALITATIVE ([facility] ONLY)   ____________________________________________  EKG  None ____________________________________________  RADIOLOGY  ED MD interpretation: None  Official radiology report(s): No results found.  ____________________________________________   PROCEDURES  Procedure(s) performed (including Critical Care):  Procedures   ____________________________________________    INITIAL IMPRESSION / ASSESSMENT AND PLAN / ED COURSE  As part of my medical decision making, I reviewed the following data within the electronic MEDICAL RECORD NUMBER Nursing notes reviewed and incorporated, Labs reviewed, Old chart reviewed, A consult was requested and obtained from this/these consultant(s) Psychiatry and Notes from prior ED visits     Rita Sullivan was evaluated in Emergency Department on 03/18/2019 for the symptoms described in the history of present illness. She was evaluated in the context of the global COVID-19 pandemic, which necessitated consideration that the patient might be at risk for infection with the SARS-CoV-2 virus that causes COVID-19. Institutional protocols and algorithms that pertain to the evaluation of patients at risk for COVID-19 are in a state of rapid change based on information released by regulatory bodies including the CDC and federal and state organizations. These policies and algorithms were followed during the patient's care in the ED.    63 year old female with schizoaffective disorder brought to the ED from home by her husband for mania.  On examination, patient seems to have  a flat affect.  Contracts for safety while in the emergency department.  Laboratory results reviewed; patient is medically cleared at this time.  Will place on CIWA protocol pending psychiatric evaluation and disposition.   Clinical Course as of Mar 17 558  Sat Mar 18, 2019  0559 Patient remains voluntary in the emergency department awaiting psychiatric evaluation and disposition.   [JS]    Clinical Course User Index [JS] Irean HongSung, Cara Aguino J, MD     ____________________________________________   FINAL CLINICAL IMPRESSION(S) / ED DIAGNOSES  Final diagnoses:  Schizoaffective disorder, bipolar type [facility](HCC)     ED Discharge Orders    None       Note:  This document was prepared using Dragon voice recognition software and may include unintentional dictation errors.    Irean HongSung, Ardian Haberland J, MD 03/18/19 [phone]

## 2019-03-18 NOTE — ED Notes (Signed)
Pt returned to her bed.

## 2019-03-18 NOTE — ED Notes (Signed)
Patient assigned to appropriate care area   Introduced self to pt  Patient oriented to unit/care area: Informed that, for their safety, care areas are designed for safety and visiting and phone hours explained to patient. Patient verbalizes understanding, and verbal contract for safety obtained  Environment secured  

## 2019-03-18 NOTE — BH Assessment (Signed)
Patient has been accepted to [facility].  Patient assigned to [facility], bed 152-2 Accepting physician is Dr. Bud Face.  Call report to [phone].  Representative was [employer].   ER Staff is aware of it:  Ronnie, ER Secretary   Donneta Romberg, Patient's Nurse     Writer called and left a HIPPA Compliant message with husband (Ruperto-[phone]), requesting a return phone call.   Address: [address]  [place], [place] [address]   Writer will need to arrive before 11:00pm. If she's unable to arrive before then, she will need to arrived tomorrow (03/19/2019) anytime after 6:00am.

## 2019-03-18 NOTE — ED Notes (Addendum)
Patient transferred to [facility] with [place] and patient received transfer papers. Patient received belongings and verbalized she has received all of her belongings. Patient appropriate and cooperative, Denies SI/HI AVH. Vital signs taken. NAD noted.

## 2019-03-18 NOTE — BH Assessment (Signed)
Assessment Note  Rita Sullivan is an 63 y.o. female who presents to the ER via her husband due to him having concerns with the recent declined in her mental health. Per the report of the patient, her husband knows her and is able to recognize when she's not doing well. Patient states she believe she's "okay but I may not be, cause he (husband) brought me."  Patient admits to haven't slept in two days, as well as starting to her voices. When asked what the voices are saying, patient would change the subject and start talking about random things.  Per the report of the patient's husband, Ruperto [phone]), she has a history of Bipolar and does well for several years and then regressed. She's known to "up and leave" and go different parts of the country and it result in him calling law enforcement to get involved. She's usually found in a hospital, admitted to a psychiatric unit. Her pervious episode resulted in her going to [place] and husband had to get boarder control and law enforcement involved to locate her. She would also spend large amounts of money in a short timeframe. Husband further reports, her current symptoms are indicators she's regression to the point of being unmanageable and unsafe. Current symptoms include; lack of sleep, she's having auditory hallucinations, laughing at inappropriate times and wanting to have sex more frequently. Her outpatient provider is aware and was trying to address on that level but have been unsuccessful. Her last hospitalization was with [facility] 01/2017.  During the interview the patient was cooperative and pleasant. She attempted to engage in the interview. At times her speech was rapid and pressure then she would have thought blocking and stop talking. Writer would have to repeat the question one to two more times before she answered. Patient denies history of violence or aggression. She has no involvement with the legal system. She denies the use of any  mind-altering substances but upon arrival to the ER her BAC was 33. She admits to having A/H but will not share what they are saying. She denies V/H and SI/HI.  Diagnosis: Bipolar  Past Medical History:  Past Medical History:  Diagnosis Date  . Bipolar disorder (HCC)   . Chronic kidney disease   . Diabetes mellitus without complication (HCC)   . Hypercholesteremia   . Manic behavior (HCC)     Past Surgical History:  Procedure Laterality Date  . COLONOSCOPY WITH PROPOFOL N/A 03/11/2015   Procedure: COLONOSCOPY WITH PROPOFOL;  Surgeon: Wallace Cullens, MD;  Location: [facility] ENDOSCOPY;  Service: Gastroenterology;  Laterality: N/A;    Family History:  Family History  Problem Relation Age of Onset  . Diabetes Father   . Diabetes Sister     Social History:  reports that she has never smoked. She has never used smokeless tobacco. She reports current alcohol use of about 2.0 standard drinks of alcohol per week. She reports that she does not use drugs.  Additional Social History:  Alcohol / Drug Use Pain Medications: See PTA Prescriptions: See PTA Over the Counter: See PTA History of alcohol / drug use?: No history of alcohol / drug abuse Longest period of sobriety (when/how long): Reports of none  CIWA: CIWA-Ar BP: 107/80 Pulse Rate: 85 Nausea and Vomiting: no nausea and no vomiting Tactile Disturbances: none Tremor: no tremor Auditory Disturbances: very mild harshness or ability to frighten Paroxysmal Sweats: no sweat visible Visual Disturbances: not present Anxiety: mildly anxious Headache, Fullness in Head: none  present Agitation: normal activity Orientation and Clouding of Sensorium: oriented and can do serial additions CIWA-Ar Total: 2 COWS:    Allergies:  Allergies  Allergen Reactions  . Beef-Derived Products     Does not eat beef  Update: Pt states that she does eat beef  . Divalproex Sodium     "bad for my liver"  . Pork-Derived Products     Does not eat  pork Patient states that she does eat pork  . Penicillins Rash    Home Medications: (Not in a hospital admission)   OB/GYN Status:  No LMP recorded. Patient is postmenopausal.  General Assessment Data [facility] Assessment: In system Is this a Tele or Face-to-Face Assessment?: Face-to-Face Is this an Initial Assessment or a Re-assessment for this encounter?: Initial Assessment Language Other than English: No Living Arrangements: Other (Comment)(Private Home) What gender do you identify as?: Female Marital status: Married Pregnancy Status: No Living Arrangements: Spouse/significant other Can pt return to current living arrangement?: Yes Admission Status: Voluntary Is patient capable of signing voluntary admission?: Yes Referral Source: Self/Family/Friend Insurance type: [employer] worker[employer] Medicare  Medical Screening Exam [facility]) Medical Exam completed: Yes  Crisis Care Plan Living Arrangements: Spouse/significant other Legal Guardian: (Self) Name of Psychiatrist: Crystal Montague-Psychiatric Nurse Practitioner([facility]) Name of Therapist: [facility]  Education Status Is patient currently in school?: No Is the patient employed, unemployed or receiving disability?: Unemployed, Receiving disability income  Risk to self with the past 6 months Suicidal Ideation: No Has patient been a risk to self within the past 6 months prior to admission? : No Suicidal Intent: No Has patient had any suicidal intent within the past 6 months prior to admission? : No Is patient at risk for suicide?: No Suicidal Plan?: No Has patient had any suicidal plan within the past 6 months prior to admission? : No Access to Means: No What has been your use of drugs/alcohol within the last 12 months?: Reports of none Previous Attempts/Gestures: No Other Self Harm Risks: Reports of none Triggers for Past Attempts: None known Intentional  Self Injurious Behavior: None Family Suicide History: Unknown Recent stressful life event(s): Other (Comment) Persecutory voices/beliefs?: No Depression: Yes Depression Symptoms: Insomnia Substance abuse history and/or treatment for substance abuse?: No Suicide prevention information given to non-admitted patients: Not applicable  Risk to Others within the past 6 months Homicidal Ideation: No Does patient have any lifetime risk of violence toward others beyond the six months prior to admission? : No Thoughts of Harm to Others: No Current Homicidal Intent: No Current Homicidal Plan: No Access to Homicidal Means: No Identified Victim: Reports of none History of harm to others?: No Assessment of Violence: None Noted Violent Behavior Description: Reports of none Does patient have access to weapons?: No Criminal Charges Pending?: No Does patient have a court date: No Is patient on probation?: No  Psychosis Hallucinations: Auditory Delusions: Unspecified  Mental Status Report Appearance/Hygiene: Unremarkable, In scrubs Eye Contact: Good Motor Activity: Freedom of movement, Unremarkable Speech: Unremarkable, Pressured, Rapid Level of Consciousness: Alert Mood: Anxious, Labile, Pleasant Affect: Appropriate to circumstance, Labile, Anxious Anxiety Level: Moderate Thought Processes: Tangential, Flight of Ideas Judgement: Partial Orientation: Person, Place, Time, Situation, Appropriate for developmental age Obsessive Compulsive Thoughts/Behaviors: Moderate  Cognitive Functioning Concentration: Decreased Memory: Recent Intact, Remote Intact Is patient IDD: No Insight: Poor Impulse Control: Fair Appetite: Poor Have you had any weight changes? : Loss Amount of the weight change? (lbs): 2 lbs("I think  just a couple pounds") Sleep: Decreased Total Hours of Sleep: 0(Haven't slept in two days) Vegetative Symptoms: None  ADLScreening [facility] Assessment Services) Patient's cognitive  ability adequate to safely complete daily activities?: Yes Patient able to express need for assistance with ADLs?: Yes Independently performs ADLs?: Yes (appropriate for developmental age)  Prior Inpatient Therapy Prior Inpatient Therapy: Yes Prior Therapy Dates: Multiple Hospitalizations Prior Therapy Facilty/Provider(s): Multiple Hospitalizations Reason for Treatment: Bipolar-Manic  Prior Outpatient Therapy Prior Outpatient Therapy: Yes Prior Therapy Dates: Current Prior Therapy Facilty/Provider(s): [facility]. Reason for Treatment: Bipolar Does patient have an ACCT team?: No Does patient have Intensive In-House Services?  : No Does patient have [facility] services? : No Does patient have P4CC services?: No  ADL Screening (condition at time of admission) Patient's cognitive ability adequate to safely complete daily activities?: Yes Is the patient deaf or have difficulty hearing?: No Does the patient have difficulty seeing, even when wearing glasses/contacts?: No Does the patient have difficulty concentrating, remembering, or making decisions?: No Patient able to express need for assistance with ADLs?: Yes Does the patient have difficulty dressing or bathing?: No Independently performs ADLs?: Yes (appropriate for developmental age) Does the patient have difficulty walking or climbing stairs?: No Weakness of Legs: None Weakness of Arms/Hands: None  Home Assistive Devices/Equipment Home Assistive Devices/Equipment: None  Therapy Consults (therapy consults require a physician order) PT Evaluation Needed: No OT Evalulation Needed: No SLP Evaluation Needed: No Abuse/Neglect Assessment (Assessment to be complete while patient is alone) Abuse/Neglect Assessment Can Be Completed: Yes Physical Abuse: Denies Verbal Abuse: Denies Sexual Abuse: Denies Exploitation of patient/patient's resources: Denies Self-Neglect: Denies Values / Beliefs Cultural Requests During  Hospitalization: None Spiritual Requests During Hospitalization: None Consults Spiritual Care Consult Needed: No Social Work Consult Needed: No Regulatory affairs officer (For Healthcare) Does Patient Have a Medical Advance Directive?: No Would patient like information on creating a medical advance directive?: No - Patient declined       Child/Adolescent Assessment Running Away Risk: Denies(Patient is an adult)  Disposition:  Disposition Initial Assessment Completed for this Encounter: Yes  On Site Evaluation by:   Reviewed with Physician:    Gunnar Fusi MS, LCAS, [facility], [place] Therapeutic Triage Specialist 03/18/2019 12:20 PM

## 2019-03-18 NOTE — BH Assessment (Signed)
Referral information for Psychiatric Hospitalization faxed to;   [facility] ([phone]), No beds  . [facility] [phone]   . [facility] [phone])  . [facility] [phone] -or- [phone])  . [facility] ([phone]),  . [facility] [phone], [phone], [phone] or [phone]),   . [facility] [phone] or [phone])  . [facility] [phone]),   . [facility] [phone] -or- [phone]),   . [facility] [phone] or [phone])  . [facility] [phone] or [phone]),   . [facility] [phone]).  [facility] [phone])

## 2019-03-18 NOTE — ED Notes (Signed)
Report given to [facility] MD and pt taken to consult room for consult.

## 2019-03-18 NOTE — ED Notes (Signed)
emtala reviewed by this RN 

## 2019-03-24 ENCOUNTER — Other Ambulatory Visit: Payer: Self-pay

## 2019-03-24 DIAGNOSIS — N189 Chronic kidney disease, unspecified: Secondary | ICD-10-CM | POA: Diagnosis not present

## 2019-03-24 DIAGNOSIS — F259 Schizoaffective disorder, unspecified: Secondary | ICD-10-CM | POA: Insufficient documentation

## 2019-03-24 DIAGNOSIS — Z20828 Contact with and (suspected) exposure to other viral communicable diseases: Secondary | ICD-10-CM | POA: Diagnosis not present

## 2019-03-24 DIAGNOSIS — Z046 Encounter for general psychiatric examination, requested by authority: Secondary | ICD-10-CM | POA: Insufficient documentation

## 2019-03-24 DIAGNOSIS — Z7984 Long term (current) use of oral hypoglycemic drugs: Secondary | ICD-10-CM | POA: Diagnosis not present

## 2019-03-24 DIAGNOSIS — G4700 Insomnia, unspecified: Secondary | ICD-10-CM | POA: Diagnosis present

## 2019-03-24 DIAGNOSIS — E1122 Type 2 diabetes mellitus with diabetic chronic kidney disease: Secondary | ICD-10-CM | POA: Insufficient documentation

## 2019-03-24 DIAGNOSIS — Z79899 Other long term (current) drug therapy: Secondary | ICD-10-CM | POA: Insufficient documentation

## 2019-03-24 DIAGNOSIS — F3110 Bipolar disorder, current episode manic without psychotic features, unspecified: Secondary | ICD-10-CM | POA: Diagnosis not present

## 2019-03-24 NOTE — ED Notes (Addendum)
This RN and Kadijah NT changed patient into burgundy top and hospital provided underwear. Patient removed bottoms, pockets were checked by this RN. Patient's belonging's placed into a labeled bag. Patient is wearing her glasses and a purple mask. Patient's belonging's include:  1 pair black crocks, 1 red top. 1 plaid coat.   Patient was not wearing underwear.

## 2019-03-24 NOTE — ED Triage Notes (Addendum)
Patient brought to ED tonight because her husband believes she is manic.  Patient was just discharged from a psychiatric hospital in [place] yesterday. Patient denies SI and HI. Patient reports she does not feel like she's manic, she just does not want to take her medications; She does not believe she needs them anymore. Patient will not answer all triage questions. Patient has delayed responses on the questions she will answer. Patient's speech seems pressured. Patient constantly licking teeth/gums.

## 2019-03-25 ENCOUNTER — Emergency Department
Admission: EM | Admit: 2019-03-25 | Discharge: 2019-03-27 | Disposition: A | Discharge status: Other Acute Inpatient Hospital | Payer: Medicare HMO | Attending: Emergency Medicine | Admitting: Emergency Medicine

## 2019-03-25 DIAGNOSIS — F250 Schizoaffective disorder, bipolar type: Secondary | ICD-10-CM | POA: Diagnosis present

## 2019-03-25 DIAGNOSIS — F3110 Bipolar disorder, current episode manic without psychotic features, unspecified: Secondary | ICD-10-CM

## 2019-03-25 LAB — CBC
HCT: 44.1 % (ref 36.0–46.0)
Hemoglobin: 14.9 g/dL (ref 12.0–15.0)
MCH: 31.8 pg (ref 26.0–34.0)
MCHC: 33.8 g/dL (ref 30.0–36.0)
MCV: 94.2 fL (ref 80.0–100.0)
Platelets: 303 10*3/uL (ref 150–400)
RBC: 4.68 MIL/uL (ref 3.87–5.11)
RDW: 11.7 % (ref 11.5–15.5)
WBC: 9.2 10*3/uL (ref 4.0–10.5)
nRBC: 0 % (ref 0.0–0.2)

## 2019-03-25 LAB — COMPREHENSIVE METABOLIC PANEL
ALT: 26 U/L (ref 0–44)
AST: 20 U/L (ref 15–41)
Albumin: 4.6 g/dL (ref 3.5–5.0)
Alkaline Phosphatase: 80 U/L (ref 38–126)
Anion gap: 14 (ref 5–15)
BUN: 13 mg/dL (ref 8–23)
CO2: 21 mmol/L — ABNORMAL LOW (ref 22–32)
Calcium: 10 mg/dL (ref 8.9–10.3)
Chloride: 104 mmol/L (ref 98–111)
Creatinine, Ser: 0.73 mg/dL (ref 0.44–1.00)
GFR calc Af Amer: >60 mL/min (ref >60)
GFR calc non Af Amer: >60 mL/min (ref >60)
Glucose, Bld: 178 mg/dL — ABNORMAL HIGH (ref 70–99)
Potassium: 4.4 mmol/L (ref 3.5–5.1)
Sodium: 139 mmol/L (ref 135–145)
Total Bilirubin: 0.5 mg/dL (ref 0.3–1.2)
Total Protein: 7.6 g/dL (ref 6.5–8.1)

## 2019-03-25 LAB — URINE DRUG SCREEN, QUALITATIVE (ARMC ONLY)
Amphetamines, Ur Screen: NOT DETECTED
Barbiturates, Ur Screen: NOT DETECTED
Benzodiazepine, Ur Scrn: NOT DETECTED
Cannabinoid 50 Ng, Ur ~~LOC~~: NOT DETECTED
Cocaine Metabolite,Ur ~~LOC~~: NOT DETECTED
MDMA (Ecstasy)Ur Screen: NOT DETECTED
Methadone Scn, Ur: NOT DETECTED
Opiate, Ur Screen: NOT DETECTED
Phencyclidine (PCP) Ur S: NOT DETECTED
Tricyclic, Ur Screen: NOT DETECTED

## 2019-03-25 LAB — ACETAMINOPHEN LEVEL: Acetaminophen (Tylenol), Serum: <10 ug/mL — ABNORMAL LOW (ref 10–30)

## 2019-03-25 LAB — ETHANOL: Alcohol, Ethyl (B): <10 mg/dL (ref <10)

## 2019-03-25 LAB — SARS CORONAVIRUS 2 BY RT PCR (HOSPITAL ORDER, PERFORMED IN ~~LOC~~ HOSPITAL LAB): SARS Coronavirus 2: NEGATIVE

## 2019-03-25 LAB — SALICYLATE LEVEL: Salicylate Lvl: <7 mg/dL (ref 2.8–30.0)

## 2019-03-25 MED ORDER — ROSUVASTATIN CALCIUM 40 MG PO TABS
40.00 | ORAL_TABLET | ORAL | Status: DC
Start: 2019-03-24 — End: 2019-03-25

## 2019-03-25 MED ORDER — GENERIC EXTERNAL MEDICATION
Status: DC
Start: ? — End: 2019-03-25

## 2019-03-25 MED ORDER — INSULIN LISPRO 100 UNIT/ML ~~LOC~~ SOLN
1.00 | SUBCUTANEOUS | Status: DC
Start: ? — End: 2019-03-25

## 2019-03-25 MED ORDER — ZIPRASIDONE HCL 40 MG PO CAPS
80.00 | ORAL_CAPSULE | ORAL | Status: DC
Start: 2019-03-23 — End: 2019-03-25

## 2019-03-25 MED ORDER — INSULIN GLARGINE 100 UNIT/ML ~~LOC~~ SOLN
1.00 | SUBCUTANEOUS | Status: DC
Start: ? — End: 2019-03-25

## 2019-03-25 MED ORDER — PROPRANOLOL HCL 10 MG PO TABS
20.00 | ORAL_TABLET | ORAL | Status: DC
Start: 2019-03-23 — End: 2019-03-25

## 2019-03-25 MED ORDER — METFORMIN HCL 500 MG PO TABS
500.0000 mg | ORAL_TABLET | Freq: Two times a day (BID) | ORAL | Status: DC
  Administered 2019-03-25 – 2019-03-27 (×4): 500 mg via ORAL
  Filled 2019-03-25 (×4): qty 1

## 2019-03-25 MED ORDER — METFORMIN HCL 500 MG PO TABS
750.00 | ORAL_TABLET | ORAL | Status: DC
Start: 2019-03-23 — End: 2019-03-25

## 2019-03-25 MED ORDER — ASENAPINE MALEATE 5 MG SL SUBL
10.0000 mg | SUBLINGUAL_TABLET | Freq: Two times a day (BID) | SUBLINGUAL | Status: DC
  Administered 2019-03-25 – 2019-03-26 (×4): 10 mg via SUBLINGUAL
  Filled 2019-03-25 (×7): qty 2

## 2019-03-25 MED ORDER — INSULIN GLARGINE 100 UNIT/ML ~~LOC~~ SOLN
1.00 | SUBCUTANEOUS | Status: DC
Start: 2019-03-23 — End: 2019-03-25

## 2019-03-25 MED ORDER — TRAZODONE HCL 100 MG PO TABS
100.00 | ORAL_TABLET | ORAL | Status: DC
Start: ? — End: 2019-03-25

## 2019-03-25 MED ORDER — TRAZODONE HCL 100 MG PO TABS: 100.0000 mg | ORAL_TABLET | Freq: Every evening | ORAL | Status: DC | PRN

## 2019-03-25 MED ORDER — MAGNESIUM HYDROXIDE 400 MG/5ML PO SUSP
15.00 | ORAL | Status: DC
Start: ? — End: 2019-03-25

## 2019-03-25 MED ORDER — BENZTROPINE MESYLATE 1 MG/ML IJ SOLN
1.0000 mg | Freq: Once | INTRAMUSCULAR | Status: DC
  Filled 2019-03-25: qty 1

## 2019-03-25 MED ORDER — ROSUVASTATIN CALCIUM 20 MG PO TABS
40.0000 mg | ORAL_TABLET | Freq: Every day | ORAL | Status: DC
  Administered 2019-03-25 – 2019-03-26 (×2): 40 mg via ORAL
  Filled 2019-03-25 (×2): qty 2

## 2019-03-25 MED ORDER — BENZTROPINE MESYLATE 1 MG PO TABS
1.0000 mg | ORAL_TABLET | Freq: Once | ORAL | Status: AC
  Administered 2019-03-25: 1 mg via ORAL
  Filled 2019-03-25: qty 1

## 2019-03-25 MED ORDER — CARBAMAZEPINE 200 MG PO TABS
400.00 | ORAL_TABLET | ORAL | Status: DC
Start: 2019-03-23 — End: 2019-03-25

## 2019-03-25 MED ORDER — ALUM & MAG HYDROXIDE-SIMETH 200-200-20 MG/5ML PO SUSP
30.00 | ORAL | Status: DC
Start: ? — End: 2019-03-25

## 2019-03-25 MED ORDER — INSULIN LISPRO 100 UNIT/ML ~~LOC~~ SOLN
1.00 | SUBCUTANEOUS | Status: DC
Start: 2019-03-23 — End: 2019-03-25

## 2019-03-25 NOTE — ED Notes (Signed)
Meal tray given 

## 2019-03-25 NOTE — ED Notes (Signed)
Pt given lunch tray.

## 2019-03-25 NOTE — BH Assessment (Signed)
Assessment Note  Rita Sullivan is an 63 y.o. female. Who a history of bipolar disorder who was recently discharged from psychiatric hospital in [place] presents to the emergency department via her husband who states that she is "manic".  Patient well known to the service with multiple admissions, most presentation occurring on 10/24. Please refer to most recent assessment for complete psychosocial history. Pt presents today as oriented X4 and with primary complaints of chronic hallucinations. Patient has had states that the voices tell her not to take her medication. She states that she has been non-compliant for two days. Pt currently lives with her husband and states that she feels safe at home.Pt. denies the presence of any auditory or visual hallucinations at this time. Patient denies any other medical complaints.   Diagnosis: Bipolar Disorder  Past Medical History:  Past Medical History:  Diagnosis Date  . Bipolar disorder (HCC)   . Chronic kidney disease   . Diabetes mellitus without complication (HCC)   . Hypercholesteremia   . Manic behavior (HCC)     Past Surgical History:  Procedure Laterality Date  . COLONOSCOPY WITH PROPOFOL N/A 03/11/2015   Procedure: COLONOSCOPY WITH PROPOFOL;  Surgeon: Wallace Cullens, MD;  Location[facility] ENDOSCOPY;  Service: Gastroenterology;  Laterality: N/A;    Family History:  Family History  Problem Relation Age of Onset  . Diabetes Father   . Diabetes Sister     Social History:  reports that she has never smoked. She has never used smokeless tobacco. She reports current alcohol use of about 2.0 standard drinks of alcohol per week. She reports that she does not use drugs.  Additional Social History:  Alcohol / Drug Use Pain Medications: See PTA Prescriptions: See PTA Over the Counter: See PTA History of alcohol / drug use?: No history of alcohol / drug abuse Longest period of sobriety (when/how long): Reports of none  CIWA: CIWA-Ar BP: (!)  151/98 Pulse Rate: 97 COWS:    Allergies:  Allergies  Allergen Reactions  . Beef-Derived Products Other (See Comments)    Does not eat beef  Update: Pt states that she does eat beef  . Divalproex Sodium Other (See Comments)    "bad for my liver"  . Pork-Derived Products Other (See Comments)    Does not eat pork Patient states that she does eat pork  . Penicillins Rash    Home Medications: (Not in a hospital admission)   OB/GYN Status:  No LMP recorded. Patient is postmenopausal.  General Assessment Data Location of Assessment: [facility] ED TTS Assessment: In system Is this a Tele or Face-to-Face Assessment?: Tele Assessment Is this an Initial Assessment or a Re-assessment for this encounter?: Initial Assessment Language Other than English: No Living Arrangements: Other (Comment) What gender do you identify as?: Female Marital status: Married Pregnancy Status: No Living Arrangements: Spouse/significant other Can pt return to current living arrangement?: Yes Admission Status: Voluntary Is patient capable of signing voluntary admission?: Yes Referral Source: Self/Family/Friend Insurance type: Medicare  Medical Screening Exam [facility] Walk-in ONLY) Medical Exam completed: Yes  Crisis Care Plan Living Arrangements: Spouse/significant other Name of Psychiatrist: Crystal Montague-Psychiatric Nurse Practitioner Name of Therapist: [facility]  Education Status Is patient currently in school?: No Is the patient employed, unemployed or receiving disability?: Unemployed, Receiving disability income  Risk to self with the past 6 months Suicidal Ideation: No Has patient been a risk to self within the past 6 months prior to admission? : No Suicidal Intent: No  Has patient had any suicidal intent within the past 6 months prior to admission? : No Is patient at risk for suicide?: No, but patient needs Medical Clearance Suicidal Plan?: No Has patient had any suicidal  plan within the past 6 months prior to admission? : No Access to Means: No What has been your use of drugs/alcohol within the last 12 months?: 0 Previous Attempts/Gestures: No How many times?: 0 Other Self Harm Risks: 0 Triggers for Past Attempts: None known Intentional Self Injurious Behavior: None Family Suicide History: Unknown Persecutory voices/beliefs?: Yes Depression: Yes Depression Symptoms: Insomnia Substance abuse history and/or treatment for substance abuse?: No Suicide prevention information given to non-admitted patients: Not applicable  Risk to Others within the past 6 months Homicidal Ideation: No Does patient have any lifetime risk of violence toward others beyond the six months prior to admission? : No Thoughts of Harm to Others: No Current Homicidal Intent: No Current Homicidal Plan: No Access to Homicidal Means: No History of harm to others?: No Assessment of Violence: None Noted Violent Behavior Description: none Does patient have access to weapons?: No Criminal Charges Pending?: No Does patient have a court date: No Is patient on probation?: No  Psychosis Hallucinations: Auditory Delusions: Unspecified  Mental Status Report Appearance/Hygiene: Unremarkable, In scrubs Eye Contact: Fair Motor Activity: Freedom of movement Speech: Unremarkable, Pressured, Rapid Level of Consciousness: Alert Mood: Anxious Affect: Appropriate to circumstance, Labile, Anxious Anxiety Level: Moderate Thought Processes: Flight of Ideas, Tangential Judgement: Partial Orientation: Person, Place, Time, Situation, Appropriate for developmental age Obsessive Compulsive Thoughts/Behaviors: Moderate  Cognitive Functioning Concentration: Decreased Memory: Remote Intact, Recent Intact Is patient IDD: No Insight: Poor Impulse Control: Fair Appetite: Poor Have you had any weight changes? : Loss Amount of the weight change? (lbs): 2 lbs Sleep: Decreased Total Hours of Sleep:  0 Vegetative Symptoms: None  ADLScreening [facility] Assessment Services) Patient's cognitive ability adequate to safely complete daily activities?: Yes Patient able to express need for assistance with ADLs?: Yes Independently performs ADLs?: Yes (appropriate for developmental age)  Prior Inpatient Therapy Prior Inpatient Therapy: Yes Prior Therapy Dates: Multiple Hospitalizations Prior Therapy Facilty/Provider(s): Multiple Hospitalizations Reason for Treatment: Bipolar-Manic  Prior Outpatient Therapy Prior Outpatient Therapy: Yes Prior Therapy Dates: Current Prior Therapy Facilty/Provider(s): [place]. Reason for Treatment: Bipolar Does patient have an ACCT team?: No Does patient have Intensive In-House Services?  : No Does patient have [facility] services? : No Does patient have P4CC services?: No  ADL Screening (condition at time of admission) Patient's cognitive ability adequate to safely complete daily activities?: Yes Patient able to express need for assistance with ADLs?: Yes Independently performs ADLs?: Yes (appropriate for developmental age)       Abuse/Neglect Assessment (Assessment to be complete while patient is alone) Abuse/Neglect Assessment Can Be Completed: Yes Physical Abuse: Denies Verbal Abuse: Denies Sexual Abuse: Denies Exploitation of patient/patient's resources: Denies Self-Neglect: Denies Values / Beliefs Cultural Requests During Hospitalization: None Spiritual Requests During Hospitalization: None Consults Spiritual Care Consult Needed: No Social Work Consult Needed: No Regulatory affairs officer (For Healthcare) Does Patient Have a Medical Advance Directive?: No Would patient like information on creating a medical advance directive?: No - Patient declined          Disposition:  Disposition Initial Assessment Completed for this Encounter: Yes Patient referred to: Other (Comment)(Conslult with [facility] )  On Site Evaluation by:    Reviewed with Physician:    Laretta Alstrom 03/25/2019 5:13 AM

## 2019-03-25 NOTE — ED Notes (Signed)
Hourly rounding reveals patient in rest room. No complaints, stable, in no acute distress. Q15 minute rounds and monitoring via Security Cameras to continue. 

## 2019-03-25 NOTE — ED Notes (Signed)
Report to include Situation, Background, Assessment, and Recommendations received from Amy B. RN. Patient alert and oriented, warm and dry, in no acute distress. Patient denies SI, HI, AVH and pain. Patient made aware of Q15 minute rounds and security cameras for their safety. Patient instructed to come to me with needs or concerns. 

## 2019-03-25 NOTE — ED Provider Notes (Signed)
[facility] Emergency Department Provider Note    First MD Initiated Contact with Patient 03/25/19 [phone]     (approximate)  I have reviewed the triage vital signs and the nursing notes.   HISTORY  Chief Complaint Psychiatric Evaluation    HPI Rita GeorgesLaura Kaye Sullivan is a 63 y.o. female with below list of previous medical conditions including bipolar disorder who was recently discharged from psychiatric hospital in [place] presents to the emergency department via her husband who states that she is "manic".  Patient denies any suicidal or homicidal ideation.  Patient denies any auditory or visual hallucinations.  Patient does however admit that she has not slept in the past 2 days and could not articulate a reason why.        Past Medical History:  Diagnosis Date  . Bipolar disorder (HCC)   . Chronic kidney disease   . Diabetes mellitus without complication (HCC)   . Hypercholesteremia   . Manic behavior (HCC)     Patient Active Problem List   Diagnosis Date Noted  . Tardive dyskinesia 02/19/2017  . Schizoaffective disorder, bipolar type (HCC) 08/02/2016  . Dyslipidemia 07/31/2016  . Vitamin D deficiency 07/31/2016  . Diabetes mellitus type 2, diet-controlled (HCC) 04/11/2015    Past Surgical History:  Procedure Laterality Date  . COLONOSCOPY WITH PROPOFOL N/A 03/11/2015   Procedure: COLONOSCOPY WITH PROPOFOL;  Surgeon: Wallace CullensPaul Y Oh, MD;  Location: [facility] END[facility]COPY;  Service: Gastroenterology;  Laterality: N/A;    Prior to Admission medications   Medication Sig Start Date End Date Taking? Authorizing Provider  carbamazepine (TEGRETOL) 200 MG tablet Take 2 tablets (400 mg total) by mouth 2 (two) times daily after a meal. 02/25/17  Yes Clapacs, Jackquline DenmarkJohn T, MD  metFORMIN (GLUCOPHAGE) 500 MG tablet Take 1 tablet (500 mg total) by mouth 2 (two) times daily with a meal. Patient taking differently: Take 750 mg by mouth 2 (two) times daily with a meal.  02/25/17   Yes Clapacs, Jackquline DenmarkJohn T, MD  propranolol (INDERAL) 20 MG tablet Take 20 mg by mouth 2 (two) times daily.   Yes [provider]  rosuvastatin (CRESTOR) 40 MG tablet Take 1 tablet (40 mg total) by mouth daily at 6 PM. 02/25/17  Yes Clapacs, Jackquline DenmarkJohn T, MD  traZODone (DESYREL) 100 MG tablet Take 1 tablet (100 mg total) by mouth at bedtime as needed for sleep. 02/25/17  Yes Clapacs, Jackquline DenmarkJohn T, MD  ziprasidone (GEODON) 80 MG capsule Take 1 capsule (80 mg total) by mouth 2 (two) times daily with a meal. 02/25/17  Yes Clapacs, Jackquline DenmarkJohn T, MD    Allergies Beef-derived products, Divalproex sodium, Pork-derived products, and Penicillins  Family History  Problem Relation Age of Onset  . Diabetes Father   . Diabetes Sister     Social History Social History   Tobacco Use  . Smoking status: Never Smoker  . Smokeless tobacco: Never Used  Substance Use Topics  . Alcohol use: Yes    Alcohol/week: 2.0 standard drinks    Types: 2 Glasses of wine per week    Comment: Occ  . Drug use: No    Review of Systems Constitutional: No fever/chills Eyes: No visual changes. ENT: No sore throat. Cardiovascular: Denies chest pain. Respiratory: Denies shortness of breath. Gastrointestinal: No abdominal pain.  No nausea, no vomiting.  No diarrhea.  No constipation. Genitourinary: Negative for dysuria. Musculoskeletal: Negative for neck pain.  Negative for back pain. Integumentary: Negative for rash. Neurological: Negative for headaches, focal  weakness or numbness. Psychiatric: ____________________________________________   PHYSICAL EXAM:  VITAL SIGNS: ED Triage Vitals  Enc Vitals Group     BP 03/24/19 2313 (!) 151/98     Pulse Rate 03/24/19 2313 97     Resp 03/24/19 2313 18     Temp 03/24/19 2313 98.7 F (37.1 C)     Temp src --      SpO2 03/24/19 2313 100 %     Weight 03/24/19 2314 74.8 kg (164 lb 14.5 oz)     Height --      Head Circumference --      Peak Flow --      Pain Score 03/24/19 2314 0      Pain Loc --      Pain Edu? --      Excl. in [place]? --     Constitutional: Alert and oriented.  Eyes: Conjunctivae are normal.  Mouth/Throat: Patient is wearing a mask.  Teeth grinding and lip smacking repetitively Neck: No stridor.  No meningeal signs.   Cardiovascular: Normal rate, regular rhythm. Good peripheral circulation. Grossly normal heart sounds. Respiratory: Normal respiratory effort.  No retractions. Gastrointestinal: Soft and nontender. No distention.  Musculoskeletal: No lower extremity tenderness nor edema. No gross deformities of extremities. Neurologic:  Normal speech and language. No gross focal neurologic deficits are appreciated.  Skin:  Skin is warm, dry and intact. Psychiatric: Very flat affect speech and behavior are normal.  ____________________________________________   LABS (all labs ordered are listed, but only abnormal results are displayed)  Labs Reviewed  COMPREHENSIVE METABOLIC PANEL - Abnormal; Notable for the following components:      Result Value   CO2 21 (*)    Glucose, Bld 178 (*)    All other components within normal limits  ACETAMINOPHEN LEVEL - Abnormal; Notable for the following components:   Acetaminophen (Tylenol), Serum <10 (*)    All other components within normal limits  SARS CORONAVIRUS 2 BY RT PCR (HOSPITAL ORDER, [place] [facility])  ETHANOL  SALICYLATE LEVEL  CBC  URINE DRUG SCREEN, QUALITATIVE ([place])     Procedures   ____________________________________________   INITIAL IMPRESSION / MDM / [place] / ED COURSE  As part of my medical decision making, I reviewed the following data within the electronic MEDICAL RECORD NUMBER   63 year old female presented with above-stated history and physical exam concerning for bipolar disorder with mania.  Also patient's clinical exam concerning for tardive dyskinesia so she was given a dose of Cogentin in the emergency department.  Awaiting psychiatry  consultation.  ____________________________________________  FINAL CLINICAL IMPRESSION(S) / ED DIAGNOSES  Final diagnoses:  Bipolar I disorder with mania ([place])     MEDICATIONS GIVEN DURING THIS VISIT:  Medications  benztropine (COGENTIN) tablet 1 mg (1 mg Oral Given 03/25/19 0429)     ED Discharge Orders    None      *Please note:  Rita Sullivan was evaluated in Emergency Department on 03/25/2019 for the symptoms described in the history of present illness. She was evaluated in the context of the global COVID-19 pandemic, which necessitated consideration that the patient might be at risk for infection with the SARS-CoV-2 virus that causes COVID-19. Institutional protocols and algorithms that pertain to the evaluation of patients at risk for COVID-19 are in a state of rapid change based on information released by regulatory bodies including the CDC and federal and state organizations. These policies and algorithms were followed during the  patient's care in the ED.  Some ED evaluations and interventions may be delayed as a result of limited staffing during the pandemic.*  Note:  This document was prepared using Dragon voice recognition software and may include unintentional dictation errors.   Darci Current, MD 03/25/19 [phone]

## 2019-03-25 NOTE — ED Notes (Signed)
IVC, accepted to [facility] transport to be called 11.1.20

## 2019-03-25 NOTE — ED Notes (Signed)
This RN dressed pt pants into blue scrubs at this time due to pt pants having drawstring

## 2019-03-25 NOTE — ED Notes (Signed)
Hourly rounding reveals patient in room. No complaints, stable, in no acute distress. Q15 minute rounds and monitoring via Security Cameras to continue. 

## 2019-03-25 NOTE — BH Assessment (Addendum)
Patient has been accepted to [facility].  Patient assigned to Geriatric Unit Accepting physician is Dr. Gerrit Halls.  Call report to [phone].  Representative was Cat.   ER Staff is aware of it:  Nitchia, ER Secretary  Dr. Cinda Quest, ER MD  Amy B., Patient's Nurse   Patient can arrive to accepting facility tomorrow AM - 03/26/2019  IVC Papers to be faxed to [phone] prior to arrival

## 2019-03-25 NOTE — ED Notes (Signed)
Report given to [facility] MD Theda Sers. Computer placed in room and pt understanding of process.

## 2019-03-26 NOTE — ED Notes (Signed)
Report to include Situation, Background, Assessment, and Recommendations received from Amy B. RN. Patient alert and oriented, warm and dry, in no acute distress. Patient denies SI, HI, AVH and pain. Patient made aware of Q15 minute rounds and security cameras for their safety. Patient instructed to come to me with needs or concerns. 

## 2019-03-26 NOTE — ED Notes (Signed)
Meal tray provided.

## 2019-03-26 NOTE — ED Notes (Signed)
Hourly rounding reveals patient in room. No complaints, stable, in no acute distress. Q15 minute rounds and monitoring via Security Cameras to continue. 

## 2019-03-26 NOTE — ED Notes (Signed)
Hourly rounding reveals patient sleeping in room. No complaints, stable, in no acute distress. Q15 minute rounds and monitoring via Security Cameras to continue. 

## 2019-03-26 NOTE — ED Provider Notes (Signed)
-----------------------------------------   6:03 AM on 03/26/2019 -----------------------------------------   Blood pressure 134/83, pulse 100, temperature (!) 97.4 F (36.3 C), temperature source Oral, resp. rate 18, weight 74.8 kg, SpO2 99 %.  The patient is sleeping at this time.  There have been no acute events since the last update.  Awaiting disposition plan from Behavioral Medicine and/or Social Work team(s).   Paulette Blanch, MD 03/26/19 [phone]

## 2019-03-26 NOTE — ED Notes (Signed)
PT IVC/ACCEPTED TO [place] [facility].

## 2019-03-26 NOTE — BH Assessment (Signed)
TTS spoke with Intake Clinician at [facility] (Cat: [phone]) who reports pt's bed is being held for her since pt is unable to be transported to accepting facility today.  Patient to be transported as soon as transport is available.

## 2019-03-26 NOTE — ED Notes (Signed)
IVC no transport available today 11/1

## 2019-03-26 NOTE — ED Notes (Signed)
Hourly rounding reveals patient in day room. No complaints, stable, in no acute distress. Q15 minute rounds and monitoring via [employer] to continue. Snack and beverage given.

## 2019-03-27 NOTE — ED Notes (Signed)
Hourly rounding reveals patient in room. No complaints, stable, in no acute distress. Q15 minute rounds and monitoring via Security Cameras to continue. 

## 2019-03-27 NOTE — ED Notes (Signed)
Hourly rounding reveals patient intermittently wandering in unit despite redirection. No complaints, stable, in no acute distress. Q15 minute rounds and monitoring via [employer] to continue.

## 2019-03-27 NOTE — ED Notes (Signed)
Patient wet the bed requiring dry gown and cleaning and remaking the bed. Patient reminded to ask to use the restroom as needed.

## 2019-03-27 NOTE — ED Notes (Signed)
Hourly rounding reveals patient wandering around unit despite redirection. No complaints, stable, in no acute distress. Q15 minute rounds and monitoring via [employer] to continue.

## 2019-03-27 NOTE — ED Notes (Signed)
BEHAVIORAL HEALTH ROUNDING Patient sleeping: No. Patient alert and oriented: yes Behavior appropriate: Yes.  ; If no, describe:  Nutrition and fluids offered: yes Toileting and hygiene offered: Yes  Sitter present: q15 minute observations and security monitoring Law enforcement present: Yes    

## 2019-03-27 NOTE — ED Notes (Signed)
Patient transferred to [facility] with [facility], patient received discharge papers. Patient received belongings and verbalized she has received all of her belongings. Patient appropriate and cooperative, Denies SI/HI AVH. Vital signs taken. NAD noted.

## 2019-03-27 NOTE — ED Notes (Signed)
Patient came up to nurses station and attempted to come in following the tech, writer explained to patient that she is not able to come in here but asked her what she needs and she asked for some water. Patient continues to wonder aimlessly in the dayroom, patient directed to put her socks on.

## 2019-03-27 NOTE — ED Notes (Signed)
Hourly rounding reveals patient sleeping in room. No complaints, stable, in no acute distress. Q15 minute rounds and monitoring via Security Cameras to continue. 

## 2021-02-06 ENCOUNTER — Other Ambulatory Visit: Payer: Self-pay | Admitting: Internal Medicine

## 2021-02-06 DIAGNOSIS — Z1231 Encounter for screening mammogram for malignant neoplasm of breast: Secondary | ICD-10-CM

## 2021-10-18 ENCOUNTER — Other Ambulatory Visit: Payer: Self-pay

## 2021-10-18 ENCOUNTER — Emergency Department
Admission: EM | Admit: 2021-10-18 | Discharge: 2021-10-19 | Disposition: A | Discharge status: Home / Self Care | Payer: Medicare HMO | Attending: Emergency Medicine | Admitting: Emergency Medicine

## 2021-10-18 DIAGNOSIS — E1122 Type 2 diabetes mellitus with diabetic chronic kidney disease: Secondary | ICD-10-CM | POA: Insufficient documentation

## 2021-10-18 DIAGNOSIS — Z79899 Other long term (current) drug therapy: Secondary | ICD-10-CM | POA: Diagnosis not present

## 2021-10-18 DIAGNOSIS — N189 Chronic kidney disease, unspecified: Secondary | ICD-10-CM | POA: Diagnosis not present

## 2021-10-18 DIAGNOSIS — F309 Manic episode, unspecified: Secondary | ICD-10-CM | POA: Diagnosis not present

## 2021-10-18 DIAGNOSIS — F250 Schizoaffective disorder, bipolar type: Secondary | ICD-10-CM | POA: Diagnosis present

## 2021-10-18 DIAGNOSIS — F419 Anxiety disorder, unspecified: Secondary | ICD-10-CM

## 2021-10-18 DIAGNOSIS — F319 Bipolar disorder, unspecified: Secondary | ICD-10-CM | POA: Insufficient documentation

## 2021-10-18 DIAGNOSIS — E1165 Type 2 diabetes mellitus with hyperglycemia: Secondary | ICD-10-CM | POA: Insufficient documentation

## 2021-10-18 LAB — URINE DRUG SCREEN, QUALITATIVE (ARMC ONLY)
Amphetamines, Ur Screen: NOT DETECTED
Barbiturates, Ur Screen: NOT DETECTED
Benzodiazepine, Ur Scrn: NOT DETECTED
Cannabinoid 50 Ng, Ur ~~LOC~~: NOT DETECTED
Cocaine Metabolite,Ur ~~LOC~~: NOT DETECTED
MDMA (Ecstasy)Ur Screen: NOT DETECTED
Methadone Scn, Ur: NOT DETECTED
Opiate, Ur Screen: NOT DETECTED
Phencyclidine (PCP) Ur S: NOT DETECTED
Tricyclic, Ur Screen: NOT DETECTED

## 2021-10-18 LAB — CBC
HCT: 43.6 % (ref 36.0–46.0)
Hemoglobin: 14.6 g/dL (ref 12.0–15.0)
MCH: 31.1 pg (ref 26.0–34.0)
MCHC: 33.5 g/dL (ref 30.0–36.0)
MCV: 92.8 fL (ref 80.0–100.0)
Platelets: 367 10*3/uL (ref 150–400)
RBC: 4.7 MIL/uL (ref 3.87–5.11)
RDW: 12 % (ref 11.5–15.5)
WBC: 9 10*3/uL (ref 4.0–10.5)
nRBC: 0 % (ref 0.0–0.2)

## 2021-10-18 LAB — COMPREHENSIVE METABOLIC PANEL
ALT: 24 U/L (ref 0–44)
AST: 25 U/L (ref 15–41)
Albumin: 4.5 g/dL (ref 3.5–5.0)
Alkaline Phosphatase: 114 U/L (ref 38–126)
Anion gap: 13 (ref 5–15)
BUN: 15 mg/dL (ref 8–23)
CO2: 17 mmol/L — ABNORMAL LOW (ref 22–32)
Calcium: 9.9 mg/dL (ref 8.9–10.3)
Chloride: 103 mmol/L (ref 98–111)
Creatinine, Ser: 0.73 mg/dL (ref 0.44–1.00)
GFR, Estimated: >60 mL/min (ref >60)
Glucose, Bld: 354 mg/dL — ABNORMAL HIGH (ref 70–99)
Potassium: 4.2 mmol/L (ref 3.5–5.1)
Sodium: 133 mmol/L — ABNORMAL LOW (ref 135–145)
Total Bilirubin: 0.7 mg/dL (ref 0.3–1.2)
Total Protein: 7.5 g/dL (ref 6.5–8.1)

## 2021-10-18 LAB — SALICYLATE LEVEL: Salicylate Lvl: <7 mg/dL — ABNORMAL LOW (ref 7.0–30.0)

## 2021-10-18 LAB — ETHANOL: Alcohol, Ethyl (B): <10 mg/dL (ref <10)

## 2021-10-18 LAB — CBG MONITORING, ED: Glucose-Capillary: 353 mg/dL — ABNORMAL HIGH (ref 70–99)

## 2021-10-18 LAB — ACETAMINOPHEN LEVEL: Acetaminophen (Tylenol), Serum: <10 ug/mL — ABNORMAL LOW (ref 10–30)

## 2021-10-18 MED ORDER — BUPROPION HCL ER (XL) 150 MG PO TB24
150.0000 mg | ORAL_TABLET | Freq: Every morning | ORAL | Status: DC
  Administered 2021-10-19: 150 mg via ORAL
  Filled 2021-10-18: qty 1

## 2021-10-18 MED ORDER — ARIPIPRAZOLE 10 MG PO TABS
10.0000 mg | ORAL_TABLET | Freq: Every morning | ORAL | Status: DC
  Administered 2021-10-19: 10 mg via ORAL
  Filled 2021-10-18: qty 1

## 2021-10-18 MED ORDER — LOSARTAN POTASSIUM 50 MG PO TABS
25.0000 mg | ORAL_TABLET | Freq: Every day | ORAL | Status: DC
  Administered 2021-10-18 – 2021-10-19 (×2): 25 mg via ORAL
  Filled 2021-10-18 (×2): qty 1

## 2021-10-18 MED ORDER — CARBAMAZEPINE 200 MG PO TABS
400.0000 mg | ORAL_TABLET | Freq: Two times a day (BID) | ORAL | Status: DC
  Administered 2021-10-19: 400 mg via ORAL
  Filled 2021-10-18: qty 2

## 2021-10-18 MED ORDER — METFORMIN HCL 500 MG PO TABS
500.0000 mg | ORAL_TABLET | Freq: Two times a day (BID) | ORAL | Status: DC
  Administered 2021-10-19: 500 mg via ORAL
  Filled 2021-10-18: qty 1

## 2021-10-18 MED ORDER — TRAZODONE HCL 100 MG PO TABS: 100.0000 mg | ORAL_TABLET | Freq: Every evening | ORAL | Status: DC | PRN

## 2021-10-18 MED ORDER — GLIPIZIDE 5 MG PO TABS
5.0000 mg | ORAL_TABLET | Freq: Every day | ORAL | Status: DC
  Filled 2021-10-18: qty 1

## 2021-10-18 MED ORDER — ATORVASTATIN CALCIUM 20 MG PO TABS
40.0000 mg | ORAL_TABLET | Freq: Every day | ORAL | Status: DC
  Administered 2021-10-18 – 2021-10-19 (×2): 40 mg via ORAL
  Filled 2021-10-18 (×2): qty 2

## 2021-10-18 NOTE — ED Notes (Signed)
Dinner tray provided

## 2021-10-18 NOTE — ED Provider Notes (Signed)
[facility] Provider Note    Event Date/Time   First MD Initiated Contact with Patient 10/18/21 1524     (approximate)   History   Manic Behavior   HPI  Rita Sullivan is a 66 y.o. female with history of bipolar disorder on Abilify who presents stating that she is feeling manic.  The patient states that over the last 4 days she has gotten very little sleep, is anxious, and is having "strange thoughts," mainly about things like whether she might have cancer or whether her friends might die.  She denies hearing voices or having any visual hallucinations.  She denies SI or HI.  She denies any acute medical complaints.    Physical Exam   Triage Vital Signs: ED Triage Vitals  Enc Vitals Group     BP 10/18/21 1435 (!) 125/93     Pulse Rate 10/18/21 1435 (!) 115     Resp 10/18/21 1435 18     Temp 10/18/21 1439 98 F (36.7 C)     Temp Source 10/18/21 1439 Oral     SpO2 10/18/21 1435 96 %     Weight 10/18/21 1438 173 lb (78.5 kg)     Height 10/18/21 1438 5\' 2"  (1.575 m)     Head Circumference --      Peak Flow --      Pain Score 10/18/21 1438 0     Pain Loc --      Pain Edu? --      Excl. in [place]? --     Most recent vital signs: Vitals:   10/18/21 1435 10/18/21 1439  BP: (!) 125/93   Pulse: (!) 115   Resp: 18   Temp:  98 F (36.7 C)  SpO2: 96%      General: Awake, no distress.  CV:  Good peripheral perfusion.  Resp:  Normal effort.  Abd:  No distention.  Other:  Calm and cooperative.  Organized thought.   ED Results / Procedures / Treatments   Labs (all labs ordered are listed, but only abnormal results are displayed) Labs Reviewed  COMPREHENSIVE METABOLIC PANEL - Abnormal; Notable for the following components:      Result Value   Sodium 133 (*)    CO2 17 (*)    Glucose, Bld 354 (*)    All other components within normal limits  SALICYLATE LEVEL - Abnormal; Notable for the following components:   Salicylate Lvl <7.0 (*)    All  other components within normal limits  ACETAMINOPHEN LEVEL - Abnormal; Notable for the following components:   Acetaminophen (Tylenol), Serum <10 (*)    All other components within normal limits  CBG MONITORING, ED - Abnormal; Notable for the following components:   Glucose-Capillary 353 (*)    All other components within normal limits  ETHANOL  CBC  URINE DRUG SCREEN, QUALITATIVE ([facility] ONLY)  CBG MONITORING, ED     EKG    RADIOLOGY   PROCEDURES:  Critical Care performed: No  Procedures   MEDICATIONS ORDERED IN ED: Medications  ARIPiprazole (ABILIFY) tablet 10 mg (has no administration in time range)  atorvastatin (LIPITOR) tablet 40 mg (40 mg Oral Given 10/18/21 2143)  buPROPion (WELLBUTRIN XL) 24 hr tablet 150 mg (has no administration in time range)  carbamazepine (TEGRETOL) tablet 400 mg (has no administration in time range)  glipiZIDE (GLUCOTROL) tablet 5 mg (has no administration in time range)  losartan (COZAAR) tablet 25 mg (25 mg Oral Given 10/18/21  2142)  metFORMIN (GLUCOPHAGE) tablet 500 mg (has no administration in time range)  traZODone (DESYREL) tablet 100 mg (has no administration in time range)     IMPRESSION / MDM / ASSESSMENT AND PLAN / ED COURSE  I reviewed the triage vital signs and the nursing notes.  66 year old female with PMH as noted above presents with decreased sleep, increased anxiety, and invasive thoughts, but no hallucinations, SI, or HI.  She feels like she is having an exacerbation of her bipolar disorder.  She states she has been compliant with medications and is not using any drugs or alcohol.  On exam the patient is calm and cooperative.  She was slightly tachycardic at triage with otherwise normal vital signs.  Exam is otherwise unremarkable.  I reviewed the past medical records.  The patient was last evaluated in the hospital by psychiatry in October 2020 when she also presented to the ED due to concerns of feeling manic.  At that  time she was evaluate by psychiatry, demonstrated signs of psychosis, and had an inpatient psychiatric admission.  Differential diagnosis includes, but is not limited to, bipolar disorder, acute mania.  The patient does not demonstrate any overt signs of acute psychosis.  At this time she does not demonstrate imminent danger to self or others and thus will remain voluntary.  I have ordered psychiatry and TTS consults and lab work-up for medical clearance.  Patient's presentation is most consistent with exacerbation of chronic illness.  ----------------------------------------- 11:11 PM on 10/18/2021 -----------------------------------------  Lab work-up is overall unremarkable except for elevated blood glucose.  UDS is negative.  CBC is normal.  The patient is ordered for home medications.  I consulted with NP Dixon from psychiatry and discussed the patient with her.  She recommends inpatient psychiatric admission.   FINAL CLINICAL IMPRESSION(S) / ED DIAGNOSES   Final diagnoses:  Anxiety     Rx / DC Orders   ED Discharge Orders     None        Note:  This document was prepared using Dragon voice recognition software and may include unintentional dictation errors.    Dionne Bucy, MD 10/18/21 2312

## 2021-10-18 NOTE — ED Triage Notes (Signed)
Pt states she has a hx of bipolar and feels like she is having a manic episode- pt states she did not get much sleep last night d/t it- pt states she does take medication for her bipolar and does not think she has missed any doses- pt denies suicidal and homicidal thoughts- pt denies any hallucinations

## 2021-10-18 NOTE — ED Notes (Signed)
Pt is calm and cooperative at this time. Provided blanket, water and graham crackers.

## 2021-10-18 NOTE — ED Notes (Addendum)
Pt dressed into burgundy scrubs, belongings to include:  1 pair white sandals 1 pair of jeans 1 green long sleeve 1 pair grey underwear  Pt to keep her glasses

## 2021-10-18 NOTE — ED Notes (Signed)
Psych NP speaking with patient 

## 2021-10-18 NOTE — BH Assessment (Signed)
Comprehensive Clinical Assessment (CCA) Note  10/18/2021 Rita Sullivan [id number]  Chief Complaint:  Chief Complaint  Patient presents with   Manic Behavior   Visit Diagnosis: Bipolar  Rita Sullivan is a 66 year old female who presents to the ER from home, via her husband. Patient reports, the husband is having concerns about the changes in her mood and behaviors. He told her she's starting to act manic. She agrees with her husband because she having racing thoughts and thinking more about sex, unable to sleep. She also reports, her prescriber started reducing her medications approximately three weeks ago and started to notice changes in her approximately  a week ago. During the interview the patient was calm, cooperative and pleasant. She was able to provide appropriate answers to the questions.  She denies the use of mind-altering substances and no history of violence or aggression.   CCA Screening, Triage and Referral (STR)  Patient Reported Information How did you hear about [place]? Family/Friend  What Is the Reason for Your Visit/Call Today? Starting to become manic.  How Long Has This Been Causing You Problems? 1 wk - 1 month  What Do You Feel Would Help You the Most Today? Treatment for Depression or other mood problem   Have You Recently Had Any Thoughts About Hurting Yourself? No  Are You Planning to Commit Suicide/Harm Yourself At This time? No   Have you Recently Had Thoughts About Hurting Someone [name]? No  Are You Planning to Harm Someone at This Time? No  Explanation: No data recorded  Have You Used Any Alcohol or Drugs in the Past 24 Hours? No  How Long Ago Did You Use Drugs or Alcohol? No data recorded What Did You Use and How Much? No data recorded  Do You Currently Have a Therapist/Psychiatrist? No  Name of Therapist/Psychiatrist: No data recorded  Have You Been Recently Discharged From Any Office Practice or Programs? No  Explanation of Discharge  From Practice/Program: No data recorded    CCA Screening Triage Referral Assessment Type of Contact: Face-to-Face  Telemedicine Service Delivery:   Is this Initial or Reassessment? No data recorded Date Telepsych consult ordered in [facility]:  No data recorded Time Telepsych consult ordered in [facility]:  No data recorded Location of Assessment: [facility] ED  Provider Location: [facility]   Collateral Involvement: No data recorded  Does Patient Have a Court Appointed Legal Guardian? No data recorded Name and Contact of Legal Guardian: No data recorded If Minor and Not Living with Parent(s), Who has Custody? No data recorded Is CPS involved or ever been involved? Never  Is APS involved or ever been involved? Never   Patient Determined To Be At Risk for Harm To Self or Others Based on Review of Patient Reported Information or Presenting Complaint? No  Method: No data recorded Availability of Means: No data recorded Intent: No data recorded Notification Required: No data recorded Additional Information for Danger to Others Potential: No data recorded Additional Comments for Danger to Others Potential: No data recorded Are There Guns or Other Weapons in Your Home? No data recorded Types of Guns/Weapons: No data recorded Are These Weapons Safely Secured?                            No data recorded Who Could Verify You Are Able To Have These Secured: No data recorded Do You Have any Outstanding Charges, Pending Court Dates, Parole/Probation? No data recorded  Contacted To Inform of Risk of Harm To Self or Others: No data recorded   Does Patient Present under Involuntary Commitment? No  IVC Papers Initial File Date: No data recorded  [place] of Residence: [place]   Patient Currently Receiving the Following Services: Medication Management   Determination of Need: Emergent (2 hours)   Options For Referral: ED Visit     CCA Biopsychosocial Patient Reported Schizophrenia/Schizoaffective  Diagnosis in Past: No   Strengths: Have support system, some insight, have a stable housing.   Mental Health Symptoms Depression:   Change in energy/activity   Duration of Depressive symptoms:  Duration of Depressive Symptoms: Less than two weeks   Mania:   Change in energy/activity; Overconfidence; Racing thoughts; Recklessness   Anxiety:    Worrying; Restlessness   Psychosis:   None   Duration of Psychotic symptoms:    Trauma:   N/A   Obsessions:   N/A   Compulsions:   N/A   Inattention:   N/A   Hyperactivity/Impulsivity:   N/A   Oppositional/Defiant Behaviors:   N/A   Emotional Irregularity:   N/A   Other Mood/Personality Symptoms:  No data recorded   Mental Status Exam Appearance and self-care  Stature:   Average   Weight:   Average weight   Clothing:  No data recorded  Grooming:   Normal   Cosmetic use:  No data recorded  Posture/gait:   Normal   Motor activity:   -- (Within normal range)   Sensorium  Attention:   Normal   Concentration:   Normal   Orientation:   X5   Recall/memory:   Normal   Affect and Mood  Affect:   Appropriate   Mood:   Anxious   Relating  Eye contact:   Normal   Facial expression:   Anxious   Attitude toward examiner:   Cooperative   Thought and Language  Speech flow:  Pressured   Thought content:   Appropriate to Mood and Circumstances   Preoccupation:   Other (Comment)   Hallucinations:   None   Organization:  No data recorded  [employer] of Knowledge:   Fair   Intelligence:   Average   Abstraction:   Functional   Judgement:   Fair   Dance movement psychotherapist:   Adequate   Insight:   Fair   Decision Making:   Vacilates   Social Functioning  Social Maturity:   Impulsive   Social Judgement:   Normal   Stress  Stressors:  No data recorded  Coping Ability:   Contractor Deficits:   None   Supports:   Family      Religion: Religion/Spirituality Are You A Religious Person?: No  Leisure/Recreation: Leisure / Recreation Do You Have Hobbies?: No  Exercise/Diet: Exercise/Diet Do You Exercise?: No Have You Gained or Lost A Significant Amount of Weight in the Past Six Months?: No Do You Follow a Special Diet?: No Do You Have Any Trouble Sleeping?: No   CCA Employment/Education Employment/Work Situation: Employment / Work Environmental consultant Job has Been Impacted by Current Illness: No Has Patient ever Been in the [employer]?: No  Education: Education Is Patient Currently Attending School?: No Did You Have An Individualized Education Program (IIEP): No Did You Have Any Difficulty At [employer]?: No Patient's Education Has Been Impacted by Current Illness: No   CCA Family/Childhood History Family and Relationship History: Family history Marital status: Married Does patient have children?: Yes  Childhood History:  Childhood History By whom was/is the patient raised?: Both parents Did patient suffer any verbal/emotional/physical/sexual abuse as a child?: No Did patient suffer from severe childhood neglect?: No Has patient ever been sexually abused/assaulted/raped as an adolescent or adult?: No Was the patient ever a victim of a crime or a disaster?: No Witnessed domestic violence?: No Has patient been affected by domestic violence as an adult?: No  Child/Adolescent Assessment:     CCA Substance Use Alcohol/Drug Use: Alcohol / Drug Use Pain Medications: See PTA Prescriptions: See PTA Over the Counter: See PTA History of alcohol / drug use?: No history of alcohol / drug abuse Longest period of sobriety (when/how long): n/a   ASAM's:  Six Dimensions of Multidimensional Assessment  Dimension 1:  Acute Intoxication and/or Withdrawal Potential:      Dimension 2:  Biomedical Conditions and Complications:      Dimension 3:  Emotional, Behavioral, or Cognitive Conditions and  Complications:     Dimension 4:  Readiness to Change:     Dimension 5:  Relapse, Continued use, or Continued Problem Potential:     Dimension 6:  Recovery/Living Environment:     ASAM Severity Score:    ASAM Recommended Level of Treatment:     Substance use Disorder (SUD)    Recommendations for Services/Supports/Treatments:    Discharge Disposition:    DSM5 Diagnoses: Patient Active Problem List   Diagnosis Date Noted   Tardive dyskinesia 02/19/2017   Schizoaffective disorder, bipolar type (HCC) 08/02/2016   Dyslipidemia 07/31/2016   Vitamin D deficiency 07/31/2016   Diabetes mellitus type 2, diet-controlled (HCC) 04/11/2015    Referrals to Alternative Service(s): Referred to Alternative Service(s):   Place:   Date:   Time:    Referred to Alternative Service(s):   Place:   Date:   Time:    Referred to Alternative Service(s):   Place:   Date:   Time:    Referred to Alternative Service(s):   Place:   Date:   Time:     Lilyan Gilfordalvin J. Trenten Watchman MS, LCAS, [facility], [facility] Therapeutic Triage Specialist 10/18/2021 6:29 PM

## 2021-10-18 NOTE — ED Notes (Signed)
Edp at bedside talking to pt.

## 2021-10-18 NOTE — ED Notes (Signed)
Pt states husband is going to take purse home with him

## 2021-10-19 DIAGNOSIS — F419 Anxiety disorder, unspecified: Secondary | ICD-10-CM

## 2021-10-19 DIAGNOSIS — F250 Schizoaffective disorder, bipolar type: Secondary | ICD-10-CM | POA: Diagnosis not present

## 2021-10-19 LAB — CBG MONITORING, ED: Glucose-Capillary: 308 mg/dL — ABNORMAL HIGH (ref 70–99)

## 2021-10-19 MED ORDER — GLIPIZIDE 5 MG PO TABS
5.0000 mg | ORAL_TABLET | Freq: Once | ORAL | Status: DC
Start: 2021-10-19 — End: 2021-10-19
  Filled 2021-10-19: qty 1

## 2021-10-19 MED ORDER — GLIPIZIDE 10 MG PO TABS
10.0000 mg | ORAL_TABLET | Freq: Every day | ORAL | Status: DC
  Administered 2021-10-19: 10 mg via ORAL
  Filled 2021-10-19: qty 1

## 2021-10-19 MED ORDER — OLANZAPINE 10 MG PO TABS
10.0000 mg | ORAL_TABLET | Freq: Once | ORAL | Status: AC
  Administered 2021-10-19: 10 mg via ORAL

## 2021-10-19 MED ORDER — OLANZAPINE 5 MG PO TBDP: 10.0000 mg | ORAL_TABLET | Freq: Once | ORAL | Status: DC

## 2021-10-19 NOTE — ED Notes (Signed)
Patient out of chair and going down hallway.  When stopped by security patient yelling and loud saying she needs to go to work.  Patient calmed and redirected back to recliner.  Med to be ordered by MD.

## 2021-10-19 NOTE — ED Notes (Signed)
Patient spouse here to pick up patient. E-signature not working at this time. Pt verbalized understanding of D/C instructions, prescriptions and follow up care with no further questions at this time. Pt in NAD and ambulatory at time of D/C.

## 2021-10-19 NOTE — Progress Notes (Signed)
Rita Sullivan is a 66 y.o. female with a history of bipolar disorder on Abilify, who presents stating that she is feeling manic. The patient was seen this AM. She is calm, cooperative, and here voluntarily. The patient has been calm and complaint with the rules of the unit. The patient continues to deny SI and HI. The patient shared that her outpatient provider has been tweaking her medications. The patient shared that she wanted to go home. With the patient's permission, I could speak to her husband. He did not want her to be discharged. The patient's husband, Mr. Verdene Lennert [phone], voiced that the patient needs to be admitted. This writer explained from the patient assessment; she has been calm and cooperative and is not dangerous to herself or others. I discussed with Mr. Ballard Russell that we can not hold the patient against her will. He voiced that he would pick the patient up at 1600 today. The information was shared with Pattricia Boss, the patient's nurse.

## 2021-10-19 NOTE — ED Notes (Signed)
Pt got up from out of the chair and was turning pass room 24 and tried to go down the hallway. Security asked where she is going pt stated she is going to the medication center. Security stopped pt from going further down the hall. Pt then screamed "I am the assistance of medication center." NT Beth deescalated pt and was able to get pt to sit back into the chair. Pt was provided ginger ale and a warm blanket.

## 2021-10-19 NOTE — ED Notes (Signed)
Pts husband going to come pick patient up at 4pm

## 2021-10-19 NOTE — ED Notes (Signed)
Pt throw blanket onto the floor, when asked why she did that she stated "it was the wrong color"

## 2021-10-19 NOTE — Consult Note (Addendum)
[facility] Face-to-Face Psychiatry Consult   Reason for Consult:  Psychiatric Evaluation Referring Physician:  EDP Patient Identification: Rita Sullivan MRN:  [id number] Principal Diagnosis: Schizoaffective disorder, bipolar type (HCC) Diagnosis:  Principal Problem:   Schizoaffective disorder, bipolar type (HCC)   Total Time spent with patient: 45 minutes  Subjective:   "I'm manic"  HPI:   Rita Sullivan, 66 y.o., female patient seen  by this provider; chart reviewed and consulted with Dr. Marisa Severin on 10/19/21.  Per ER triage nurse, pt states she has a hx of bipolar and feels like she is having a manic episode- pt states she did not get much sleep last night d/t it- pt states she does take medication for her bipolar and does not think she has missed any doses- pt denies suicidal and homicidal thoughts- pt denies any hallucinations.    Per Dr. Marisa Severin, Rita Sullivan is a 66 y.o. female with history of bipolar disorder on Abilify who presents stating that she is feeling manic.  The patient states that over the last 4 days she has gotten very little sleep, is anxious, and is having "strange thoughts," mainly about things like whether she might have cancer or whether her friends might die.  She denies hearing voices or having any visual hallucinations.  She denies SI or HI.  She denies any acute medical complaints.  On evaluation Rita Sullivan reports that she's manic. She says she has been diagnosed with bipolar disorder for over 30 years and is very clear on what the signs are for a manic phase. She says she has been unable to sleep. Lately, she says her mind is always racing and is always thinking about sex.  She says she has been masturbating "a lot" and cannot control herself.  She says her medications were recently changed by her outside provider. She says Bupropion and propanolol was d/c'd.  She reports getting only two hours of sleep "if that much".  She denies SI/HI/and AVH.  She  reports being hospitalized in 2020 with similar symptoms.       Recommendation:  Reassess in the AM.  Past Psychiatric History: Biploar disorder  Risk to Self:   Risk to Others:   Prior Inpatient Therapy:   Prior Outpatient Therapy:    Past Medical History:  Past Medical History:  Diagnosis Date   Bipolar disorder (HCC)    Chronic kidney disease    Diabetes mellitus without complication (HCC)    Hypercholesteremia    Manic behavior (HCC)     Past Surgical History:  Procedure Laterality Date   COLONOSCOPY WITH PROPOFOL N/A 03/11/2015   Procedure: COLONOSCOPY WITH PROPOFOL;  Surgeon: Wallace Cullens, MD;  Location: [facility] ENDOSCOPY;  Service: Gastroenterology;  Laterality: N/A;   Family History:  Family History  Problem Relation Age of Onset   Diabetes Father    Diabetes Sister    Family Psychiatric  History: unknwn Social History:  Social History   Substance and Sexual Activity  Alcohol Use Yes   Alcohol/week: 2.0 standard drinks   Types: 2 Glasses of wine per week   Comment: Occ     Social History   Substance and Sexual Activity  Drug Use No    Social History   Socioeconomic History   Marital status: Married    Spouse name: Not on file   Number of children: Not on file   Years of education: Not on file   Highest education level: Not on file  Occupational History  Not on file  Tobacco Use   Smoking status: Never   Smokeless tobacco: Never  Substance and Sexual Activity   Alcohol use: Yes    Alcohol/week: 2.0 standard drinks    Types: 2 Glasses of wine per week    Comment: Occ   Drug use: No   Sexual activity: Never  Other Topics Concern   Not on file  Social History Narrative   Not on file   Social Determinants of Health   Financial Resource Strain: Not on file  Food Insecurity: Not on file  Transportation Needs: Not on file  Physical Activity: Not on file  Stress: Not on file  Social Connections: Not on file   Additional Social History:     Allergies:   Allergies  Allergen Reactions   Beef-Derived Products Other (See Comments)    Does not eat beef  Update: Pt states that she does eat beef   Divalproex Sodium Other (See Comments)    "bad for my liver"   Pork-Derived Products Other (See Comments)    Does not eat pork Patient states that she does eat pork   Penicillins Rash    Labs:  Results for orders placed or performed during the hospital encounter of 10/18/21 (from the past 48 hour(s))  Comprehensive metabolic panel     Status: Abnormal   Collection Time: 10/18/21  2:40 PM  Result Value Ref Range   Sodium 133 (L) 135 - 145 mmol/L   Potassium 4.2 3.5 - 5.1 mmol/L   Chloride 103 98 - 111 mmol/L   CO2 17 (L) 22 - 32 mmol/L   Glucose, Bld 354 (H) 70 - 99 mg/dL    Comment: Glucose reference range applies only to samples taken after fasting for at least 8 hours.   BUN 15 8 - 23 mg/dL   Creatinine, Ser 4.09 0.44 - 1.00 mg/dL   Calcium 9.9 8.9 - 81.1 mg/dL   Total Protein 7.5 6.5 - 8.1 g/dL   Albumin 4.5 3.5 - 5.0 g/dL   AST 25 15 - 41 U/L   ALT 24 0 - 44 U/L   Alkaline Phosphatase 114 38 - 126 U/L   Total Bilirubin 0.7 0.3 - 1.2 mg/dL   GFR, Estimated >91 >47 mL/min    Comment: (NOTE) Calculated using the CKD-EPI Creatinine Equation (2021)    Anion gap 13 5 - 15    Comment: Performed at [facility], [address] [place], [place] [address]  Ethanol     Status: None   Collection Time: 10/18/21  2:40 PM  Result Value Ref Range   Alcohol, Ethyl (B) <10 <10 mg/dL    Comment: (NOTE) Lowest detectable limit for serum alcohol is 10 mg/dL.  For medical purposes only. Performed at [facility], [address] [place], [place] [address]   Salicylate level     Status: Abnormal   Collection Time: 10/18/21  2:40 PM  Result Value Ref Range   Salicylate Lvl <7.0 (L) 7.0 - 30.0 mg/dL    Comment: Performed at [facility], [address] [place], [place] [address]  Acetaminophen  level     Status: Abnormal   Collection Time: 10/18/21  2:40 PM  Result Value Ref Range   Acetaminophen (Tylenol), Serum <10 (L) 10 - 30 ug/mL    Comment: (NOTE) Therapeutic concentrations vary significantly. A range of 10-30 ug/mL  may be an effective concentration for many patients. However, some  are best treated at concentrations outside  of this range. Acetaminophen concentrations >150 ug/mL at 4 hours after ingestion  and >50 ug/mL at 12 hours after ingestion are often associated with  toxic reactions.  Performed at [facility], [address] [place], [place] [address]   cbc     Status: None   Collection Time: 10/18/21  2:40 PM  Result Value Ref Range   WBC 9.0 4.0 - 10.5 K/uL   RBC 4.70 3.87 - 5.11 MIL/uL   Hemoglobin 14.6 12.0 - 15.0 g/dL   HCT 60.443.6 54.036.0 - 98.146.0 %   MCV 92.8 80.0 - 100.0 fL   MCH 31.1 26.0 - 34.0 pg   MCHC 33.5 30.0 - 36.0 g/dL   RDW 19.112.0 47.811.5 - 29.515.5 %   Platelets 367 150 - 400 K/uL   nRBC 0.0 0.0 - 0.2 %    Comment: Performed at [facility], [address] [place], [place] [address]  Urine Drug Screen, Qualitative     Status: None   Collection Time: 10/18/21  2:40 PM  Result Value Ref Range   Tricyclic, Ur Screen NONE DETECTED NONE DETECTED   Amphetamines, Ur Screen NONE DETECTED NONE DETECTED   MDMA (Ecstasy)Ur Screen NONE DETECTED NONE DETECTED   Cocaine Metabolite,Ur [place] NONE DETECTED NONE DETECTED   Opiate, Ur Screen NONE DETECTED NONE DETECTED   Phencyclidine (PCP) Ur S NONE DETECTED NONE DETECTED   Cannabinoid 50 Ng, Ur [place] NONE DETECTED NONE DETECTED   Barbiturates, Ur Screen NONE DETECTED NONE DETECTED   Benzodiazepine, Ur Scrn NONE DETECTED NONE DETECTED   Methadone Scn, Ur NONE DETECTED NONE DETECTED    Comment: (NOTE) Tricyclics + metabolites, urine    Cutoff 1000 ng/mL Amphetamines + metabolites, urine  Cutoff 1000 ng/mL MDMA (Ecstasy), urine              Cutoff 500 ng/mL Cocaine Metabolite, urine          Cutoff  300 ng/mL Opiate + metabolites, urine        Cutoff 300 ng/mL Phencyclidine (PCP), urine         Cutoff 25 ng/mL Cannabinoid, urine                 Cutoff 50 ng/mL Barbiturates + metabolites, urine  Cutoff 200 ng/mL Benzodiazepine, urine              Cutoff 200 ng/mL Methadone, urine                   Cutoff 300 ng/mL  The urine drug screen provides only a preliminary, unconfirmed analytical test result and should not be used for non-medical purposes. Clinical consideration and professional judgment should be applied to any positive drug screen result due to possible interfering substances. A more specific alternate chemical method must be used in order to obtain a confirmed analytical result. Gas chromatography / mass spectrometry (GC/MS) is the preferred confirm atory method. Performed at [facility], [address] [place], [place] [address]   POC CBG, ED     Status: Abnormal   Collection Time: 10/18/21 10:05 PM  Result Value Ref Range   Glucose-Capillary 353 (H) 70 - 99 mg/dL    Comment: Glucose reference range applies only to samples taken after fasting for at least 8 hours.    Current Facility-Administered Medications  Medication Dose Route Frequency Provider Last Rate Last Admin   ARIPiprazole (ABILIFY) tablet 10 mg  10 mg Oral q morning Dionne BucySiadecki, Sebastian, MD  atorvastatin (LIPITOR) tablet 40 mg  40 mg Oral Daily Dionne Bucy, MD   40 mg at 10/18/21 2143   buPROPion (WELLBUTRIN XL) 24 hr tablet 150 mg  150 mg Oral q morning Dionne Bucy, MD       carbamazepine (TEGRETOL) tablet 400 mg  400 mg Oral BID WC Dionne Bucy, MD       glipiZIDE (GLUCOTROL) tablet 5 mg  5 mg Oral QAC breakfast Dionne Bucy, MD       losartan (COZAAR) tablet 25 mg  25 mg Oral Daily Dionne Bucy, MD   25 mg at 10/18/21 2142   metFORMIN (GLUCOPHAGE) tablet 500 mg  500 mg Oral BID WC Dionne Bucy, MD       traZODone (DESYREL) tablet 100 mg  100  mg Oral QHS PRN Dionne Bucy, MD       Current Outpatient Medications  Medication Sig Dispense Refill   ARIPiprazole (ABILIFY) 10 MG tablet Take 10 mg by mouth every morning.     atorvastatin (LIPITOR) 40 MG tablet Take 40 mg by mouth daily.     buPROPion (WELLBUTRIN XL) 150 MG 24 hr tablet Take 150 mg by mouth every morning.     carbamazepine (TEGRETOL) 200 MG tablet Take 2 tablets (400 mg total) by mouth 2 (two) times daily after a meal. 120 tablet 0   cholecalciferol (VITAMIN D3) 25 MCG (1000 UNIT) tablet Take 1,000 Units by mouth daily.     folic acid (FOLVITE) 1 MG tablet Take 1 mg by mouth daily.     glipiZIDE (GLUCOTROL) 5 MG tablet Take 5 mg by mouth every morning.     losartan (COZAAR) 25 MG tablet Take 25 mg by mouth daily.     metFORMIN (GLUCOPHAGE) 500 MG tablet Take 1 tablet (500 mg total) by mouth 2 (two) times daily with a meal. 60 tablet 0   propranolol (INDERAL) 20 MG tablet Take 20 mg by mouth 2 (two) times daily.     traZODone (DESYREL) 100 MG tablet Take 1 tablet (100 mg total) by mouth at bedtime as needed for sleep. 30 tablet 0   zinc sulfate 220 (50 Zn) MG capsule Take 220 mg by mouth daily.     rosuvastatin (CRESTOR) 40 MG tablet Take 1 tablet (40 mg total) by mouth daily at 6 PM. (Patient not taking: Reported on 10/18/2021) 30 tablet 0   ziprasidone (GEODON) 80 MG capsule Take 1 capsule (80 mg total) by mouth 2 (two) times daily with a meal. (Patient not taking: Reported on 10/18/2021) 60 capsule 0    Musculoskeletal: Strength & Muscle Tone: within normal limits Gait & Station: normal Patient leans: N/A  Psychiatric Specialty Exam:  Presentation  General Appearance: Appropriate for Environment  Eye Contact:Fair  Speech:Clear and Coherent  Speech Volume:Normal  Handedness:Right   Mood and Affect  Mood:Dysphoric; Anxious  Affect:Appropriate   Thought Process  Thought Processes:Coherent  Descriptions of Associations:Intact  Orientation:Full  (Time, Place and Person)  Thought Content:WDL  History of Schizophrenia/Schizoaffective disorder:No  Duration of Psychotic Symptoms:No data recorded Hallucinations:Hallucinations: None  Ideas of Reference:None  Suicidal Thoughts:Suicidal Thoughts: No  Homicidal Thoughts:Homicidal Thoughts: No   Sensorium  Memory:Immediate Fair  Judgment:Poor  Insight:Fair   Executive Functions  Concentration:Fair  Attention Span:Fair  Recall:Fair  Fund of Knowledge:Fair  Language:Fair   Psychomotor Activity  Psychomotor Activity:Psychomotor Activity: Normal   Assets  Assets:Housing; Intimacy; Leisure Time; Social Support; Vocational/Educational   Sleep  Sleep:Sleep: Poor   Physical Exam: Physical Exam Vitals  and nursing note reviewed.  HENT:     Head: Normocephalic and atraumatic.     Nose: Nose normal.  Eyes:     Pupils: Pupils are equal, round, and reactive to light.  Musculoskeletal:        General: Normal range of motion.     Cervical back: Normal range of motion.  Skin:    General: Skin is dry.  Neurological:     Mental Status: She is oriented to person, place, and time.  Psychiatric:        Attention and Perception: Attention and perception normal.        Mood and Affect: Mood normal.        Speech: Speech normal.        Behavior: Behavior normal. Behavior is cooperative.        Thought Content: Thought content normal.        Cognition and Memory: Cognition and memory normal.        Judgment: Judgment is impulsive.   Review of Systems  Psychiatric/Behavioral:  Negative for hallucinations and suicidal ideas. The patient has insomnia. The patient is not nervous/anxious.   All other systems reviewed and are negative. Blood pressure (!) 135/99, pulse (!) 120, temperature 98.5 F (36.9 C), resp. rate 14, height  (1.575 m), weight 78.5 kg, SpO2 96 %. Body mass index is 31.64 kg/m.  Treatment Plan Summary: Daily contact with patient to assess and  evaluate symptoms and progress in treatment, Medication management, and Plan Reassess in the AM  Disposition: No evidence of imminent risk to self or others at present.   Supportive therapy provided about ongoing stressors. Discussed crisis plan, support from social network, calling 911, coming to the Emergency Department, and calling Suicide Hotline. Reassess in the am.  Patient may be stable for discharge and to follow-up with outpatient provider.  Jearld Lesch, NP 10/19/2021 1:59 AM

## 2021-10-19 NOTE — ED Notes (Signed)
Lunch tray and drink given.  

## 2021-10-19 NOTE — ED Notes (Signed)
Pt states that she feels much better and doesn't think she's in a manic episode anymore. Pt says she doesn't think she needs to be here anymore. Denies any SI/HI/AVH.

## 2021-10-19 NOTE — ED Notes (Signed)
VOL/Consult completed/Reassess in AM/ D/C vs OP Tx

## 2021-10-19 NOTE — ED Notes (Signed)
Psych NP at bedside

## 2022-02-26 ENCOUNTER — Encounter: Payer: Self-pay | Admitting: Psychiatry

## 2022-02-26 ENCOUNTER — Other Ambulatory Visit: Payer: Self-pay

## 2022-02-26 ENCOUNTER — Encounter: Payer: Self-pay | Admitting: Emergency Medicine

## 2022-02-26 ENCOUNTER — Inpatient Hospital Stay
Admission: AD | Admit: 2022-02-26 | Discharge: 2022-03-13 | DRG: 885 | Disposition: A | Discharge status: Home / Self Care | Payer: Medicare HMO | Source: Intra-hospital | Attending: Psychiatry | Admitting: Psychiatry

## 2022-02-26 ENCOUNTER — Emergency Department
Admission: EM | Admit: 2022-02-26 | Discharge: 2022-02-26 | Disposition: A | Discharge status: Other Acute Inpatient Hospital | Payer: Medicare HMO | Attending: Emergency Medicine | Admitting: Emergency Medicine

## 2022-02-26 DIAGNOSIS — E871 Hypo-osmolality and hyponatremia: Secondary | ICD-10-CM | POA: Insufficient documentation

## 2022-02-26 DIAGNOSIS — N189 Chronic kidney disease, unspecified: Secondary | ICD-10-CM | POA: Insufficient documentation

## 2022-02-26 DIAGNOSIS — I10 Essential (primary) hypertension: Secondary | ICD-10-CM | POA: Diagnosis present

## 2022-02-26 DIAGNOSIS — Z888 Allergy status to other drugs, medicaments and biological substances status: Secondary | ICD-10-CM

## 2022-02-26 DIAGNOSIS — F250 Schizoaffective disorder, bipolar type: Secondary | ICD-10-CM | POA: Diagnosis present

## 2022-02-26 DIAGNOSIS — Z833 Family history of diabetes mellitus: Secondary | ICD-10-CM | POA: Diagnosis not present

## 2022-02-26 DIAGNOSIS — Z7984 Long term (current) use of oral hypoglycemic drugs: Secondary | ICD-10-CM

## 2022-02-26 DIAGNOSIS — Z1152 Encounter for screening for COVID-19: Secondary | ICD-10-CM | POA: Diagnosis not present

## 2022-02-26 DIAGNOSIS — Z880 Allergy status to penicillin: Secondary | ICD-10-CM | POA: Diagnosis not present

## 2022-02-26 DIAGNOSIS — Z79899 Other long term (current) drug therapy: Secondary | ICD-10-CM | POA: Diagnosis not present

## 2022-02-26 DIAGNOSIS — E7800 Pure hypercholesterolemia, unspecified: Secondary | ICD-10-CM | POA: Diagnosis present

## 2022-02-26 DIAGNOSIS — R443 Hallucinations, unspecified: Secondary | ICD-10-CM

## 2022-02-26 DIAGNOSIS — E119 Type 2 diabetes mellitus without complications: Secondary | ICD-10-CM | POA: Diagnosis present

## 2022-02-26 DIAGNOSIS — E1122 Type 2 diabetes mellitus with diabetic chronic kidney disease: Secondary | ICD-10-CM | POA: Insufficient documentation

## 2022-02-26 DIAGNOSIS — Z91014 Allergy to mammalian meats: Secondary | ICD-10-CM

## 2022-02-26 LAB — URINE DRUG SCREEN, QUALITATIVE (ARMC ONLY)
Amphetamines, Ur Screen: NOT DETECTED
Barbiturates, Ur Screen: NOT DETECTED
Benzodiazepine, Ur Scrn: NOT DETECTED
Cannabinoid 50 Ng, Ur ~~LOC~~: NOT DETECTED
Cocaine Metabolite,Ur ~~LOC~~: NOT DETECTED
MDMA (Ecstasy)Ur Screen: NOT DETECTED
Methadone Scn, Ur: NOT DETECTED
Opiate, Ur Screen: NOT DETECTED
Phencyclidine (PCP) Ur S: NOT DETECTED
Tricyclic, Ur Screen: NOT DETECTED

## 2022-02-26 LAB — COMPREHENSIVE METABOLIC PANEL
ALT: 19 U/L (ref 0–44)
AST: 23 U/L (ref 15–41)
Albumin: 4.2 g/dL (ref 3.5–5.0)
Alkaline Phosphatase: 87 U/L (ref 38–126)
Anion gap: 9 (ref 5–15)
BUN: 11 mg/dL (ref 8–23)
CO2: 24 mmol/L (ref 22–32)
Calcium: 9.5 mg/dL (ref 8.9–10.3)
Chloride: 95 mmol/L — ABNORMAL LOW (ref 98–111)
Creatinine, Ser: 0.8 mg/dL (ref 0.44–1.00)
GFR, Estimated: >60 mL/min (ref >60)
Glucose, Bld: 194 mg/dL — ABNORMAL HIGH (ref 70–99)
Potassium: 4.6 mmol/L (ref 3.5–5.1)
Sodium: 128 mmol/L — ABNORMAL LOW (ref 135–145)
Total Bilirubin: 0.7 mg/dL (ref 0.3–1.2)
Total Protein: 6.8 g/dL (ref 6.5–8.1)

## 2022-02-26 LAB — GLUCOSE, CAPILLARY
Glucose-Capillary: 140 mg/dL — ABNORMAL HIGH (ref 70–99)
Glucose-Capillary: 136 mg/dL — ABNORMAL HIGH (ref 70–99)

## 2022-02-26 LAB — CBC
HCT: 38.6 % (ref 36.0–46.0)
Hemoglobin: 12.8 g/dL (ref 12.0–15.0)
MCH: 30.7 pg (ref 26.0–34.0)
MCHC: 33.2 g/dL (ref 30.0–36.0)
MCV: 92.6 fL (ref 80.0–100.0)
Platelets: 332 10*3/uL (ref 150–400)
RBC: 4.17 MIL/uL (ref 3.87–5.11)
RDW: 12.3 % (ref 11.5–15.5)
WBC: 9.2 10*3/uL (ref 4.0–10.5)
nRBC: 0 % (ref 0.0–0.2)

## 2022-02-26 LAB — ETHANOL: Alcohol, Ethyl (B): <10 mg/dL (ref <10)

## 2022-02-26 LAB — SALICYLATE LEVEL: Salicylate Lvl: <7 mg/dL — ABNORMAL LOW (ref 7.0–30.0)

## 2022-02-26 LAB — ACETAMINOPHEN LEVEL: Acetaminophen (Tylenol), Serum: <10 ug/mL — ABNORMAL LOW (ref 10–30)

## 2022-02-26 LAB — SARS CORONAVIRUS 2 BY RT PCR: SARS Coronavirus 2 by RT PCR: NEGATIVE

## 2022-02-26 MED ORDER — GLIPIZIDE 5 MG PO TABS
5.0000 mg | ORAL_TABLET | Freq: Every day | ORAL | Status: DC
  Administered 2022-02-27 – 2022-03-13 (×15): 5 mg via ORAL
  Filled 2022-02-26 (×17): qty 1

## 2022-02-26 MED ORDER — LOSARTAN POTASSIUM 50 MG PO TABS
25.0000 mg | ORAL_TABLET | Freq: Every day | ORAL | Status: DC
  Administered 2022-02-26: 25 mg via ORAL
  Filled 2022-02-26: qty 1

## 2022-02-26 MED ORDER — METFORMIN HCL 500 MG PO TABS
1000.0000 mg | ORAL_TABLET | Freq: Two times a day (BID) | ORAL | Status: DC
  Administered 2022-02-26 – 2022-03-13 (×29): 1000 mg via ORAL
  Filled 2022-02-26 (×29): qty 2

## 2022-02-26 MED ORDER — INSULIN ASPART 100 UNIT/ML IJ SOLN: 0.0000 [IU] | Freq: Every day | INTRAMUSCULAR | Status: DC

## 2022-02-26 MED ORDER — ATORVASTATIN CALCIUM 10 MG PO TABS
40.0000 mg | ORAL_TABLET | Freq: Every day | ORAL | Status: DC
  Administered 2022-02-27 – 2022-03-13 (×15): 40 mg via ORAL
  Filled 2022-02-26 (×15): qty 4

## 2022-02-26 MED ORDER — MAGNESIUM HYDROXIDE 400 MG/5ML PO SUSP: 30.0000 mL | Freq: Every day | ORAL | Status: DC | PRN

## 2022-02-26 MED ORDER — INSULIN ASPART 100 UNIT/ML IJ SOLN
0.0000 [IU] | Freq: Three times a day (TID) | INTRAMUSCULAR | Status: DC
  Administered 2022-02-26: 2 [IU] via SUBCUTANEOUS
  Administered 2022-02-27: 3 [IU] via SUBCUTANEOUS
  Administered 2022-02-27: 2 [IU] via SUBCUTANEOUS
  Administered 2022-02-27 – 2022-02-28 (×4): 5 [IU] via SUBCUTANEOUS
  Administered 2022-03-01: 3 [IU] via SUBCUTANEOUS
  Administered 2022-03-01 – 2022-03-02 (×3): 5 [IU] via SUBCUTANEOUS
  Administered 2022-03-02: 3 [IU] via SUBCUTANEOUS
  Administered 2022-03-02: 8 [IU] via SUBCUTANEOUS
  Administered 2022-03-03: 5 [IU] via SUBCUTANEOUS
  Administered 2022-03-03: 11 [IU] via SUBCUTANEOUS
  Administered 2022-03-03: 5 [IU] via SUBCUTANEOUS
  Administered 2022-03-04: 3 [IU] via SUBCUTANEOUS
  Administered 2022-03-04 – 2022-03-05 (×2): 8 [IU] via SUBCUTANEOUS
  Administered 2022-03-05: 11 [IU] via SUBCUTANEOUS
  Administered 2022-03-05: 8 [IU] via SUBCUTANEOUS
  Administered 2022-03-06 (×2): 5 [IU] via SUBCUTANEOUS
  Administered 2022-03-06: 3 [IU] via SUBCUTANEOUS
  Administered 2022-03-07: 2 [IU] via SUBCUTANEOUS
  Administered 2022-03-07 (×2): 5 [IU] via SUBCUTANEOUS
  Administered 2022-03-08 (×2): 8 [IU] via SUBCUTANEOUS
  Administered 2022-03-08 – 2022-03-09 (×2): 3 [IU] via SUBCUTANEOUS
  Administered 2022-03-09 (×2): 5 [IU] via SUBCUTANEOUS
  Administered 2022-03-10: 3 [IU] via SUBCUTANEOUS
  Administered 2022-03-10: 2 [IU] via SUBCUTANEOUS
  Administered 2022-03-10 – 2022-03-11 (×2): 3 [IU] via SUBCUTANEOUS
  Administered 2022-03-11: 5 [IU] via SUBCUTANEOUS
  Administered 2022-03-11 – 2022-03-12 (×3): 3 [IU] via SUBCUTANEOUS
  Administered 2022-03-12: 8 [IU] via SUBCUTANEOUS
  Administered 2022-03-13: 3 [IU] via SUBCUTANEOUS
  Administered 2022-03-13: 5 [IU] via SUBCUTANEOUS
  Filled 2022-02-26 (×44): qty 1

## 2022-02-26 MED ORDER — VITAMIN D 25 MCG (1000 UNIT) PO TABS
2000.0000 [IU] | ORAL_TABLET | Freq: Every day | ORAL | Status: DC
  Administered 2022-02-26: 2000 [IU] via ORAL
  Filled 2022-02-26: qty 2

## 2022-02-26 MED ORDER — HYDROXYZINE HCL 25 MG PO TABS
25.0000 mg | ORAL_TABLET | Freq: Three times a day (TID) | ORAL | Status: DC | PRN
  Filled 2022-02-26: qty 1

## 2022-02-26 MED ORDER — FOLIC ACID 1 MG PO TABS: 500.0000 ug | ORAL_TABLET | Freq: Every day | ORAL | Status: DC

## 2022-02-26 MED ORDER — ACETAMINOPHEN 325 MG PO TABS: 650.0000 mg | ORAL_TABLET | Freq: Four times a day (QID) | ORAL | Status: DC | PRN

## 2022-02-26 MED ORDER — METFORMIN HCL 500 MG PO TABS: 1000.0000 mg | ORAL_TABLET | Freq: Two times a day (BID) | ORAL | Status: DC

## 2022-02-26 MED ORDER — VITAMIN D 25 MCG (1000 UNIT) PO TABS
2000.0000 [IU] | ORAL_TABLET | Freq: Every day | ORAL | Status: DC
  Administered 2022-02-27 – 2022-03-13 (×15): 2000 [IU] via ORAL
  Filled 2022-02-26 (×15): qty 2

## 2022-02-26 MED ORDER — CARBAMAZEPINE 200 MG PO TABS
400.0000 mg | ORAL_TABLET | Freq: Two times a day (BID) | ORAL | Status: DC
  Administered 2022-02-26: 400 mg via ORAL
  Filled 2022-02-26 (×2): qty 2

## 2022-02-26 MED ORDER — INSULIN ASPART 100 UNIT/ML IJ SOLN: 0.0000 [IU] | Freq: Three times a day (TID) | INTRAMUSCULAR | Status: DC

## 2022-02-26 MED ORDER — ARIPIPRAZOLE 5 MG PO TABS
5.0000 mg | ORAL_TABLET | Freq: Every morning | ORAL | Status: DC
  Administered 2022-02-27: 5 mg via ORAL
  Filled 2022-02-26: qty 1

## 2022-02-26 MED ORDER — CARBAMAZEPINE 200 MG PO TABS: 400.0000 mg | ORAL_TABLET | Freq: Two times a day (BID) | ORAL | Status: DC

## 2022-02-26 MED ORDER — ARIPIPRAZOLE 5 MG PO TABS: 5.0000 mg | ORAL_TABLET | Freq: Every morning | ORAL | Status: DC

## 2022-02-26 MED ORDER — FOLIC ACID 1 MG PO TABS
500.0000 ug | ORAL_TABLET | Freq: Every day | ORAL | Status: DC
  Administered 2022-02-27 – 2022-03-13 (×15): 0.5 mg via ORAL
  Filled 2022-02-26 (×15): qty 1

## 2022-02-26 MED ORDER — ATORVASTATIN CALCIUM 20 MG PO TABS
40.0000 mg | ORAL_TABLET | Freq: Every day | ORAL | Status: DC
  Administered 2022-02-26: 40 mg via ORAL
  Filled 2022-02-26: qty 2

## 2022-02-26 MED ORDER — ALUM & MAG HYDROXIDE-SIMETH 200-200-20 MG/5ML PO SUSP: 30.0000 mL | ORAL | Status: DC | PRN

## 2022-02-26 MED ORDER — GLIPIZIDE 5 MG PO TABS: 5.0000 mg | ORAL_TABLET | Freq: Every day | ORAL | Status: DC

## 2022-02-26 MED ORDER — LOSARTAN POTASSIUM 25 MG PO TABS
25.0000 mg | ORAL_TABLET | Freq: Every day | ORAL | Status: DC
  Administered 2022-02-27 – 2022-03-13 (×15): 25 mg via ORAL
  Filled 2022-02-26 (×15): qty 1

## 2022-02-26 MED ORDER — TRAZODONE HCL 100 MG PO TABS
100.0000 mg | ORAL_TABLET | Freq: Every day | ORAL | Status: DC
  Administered 2022-02-26 – 2022-03-12 (×15): 100 mg via ORAL
  Filled 2022-02-26 (×15): qty 1

## 2022-02-26 MED ORDER — INSULIN ASPART 100 UNIT/ML IJ SOLN
0.0000 [IU] | Freq: Every day | INTRAMUSCULAR | Status: DC
  Administered 2022-02-28: 3 [IU] via SUBCUTANEOUS
  Administered 2022-03-01 – 2022-03-02 (×2): 2 [IU] via SUBCUTANEOUS
  Administered 2022-03-03 – 2022-03-04 (×2): 3 [IU] via SUBCUTANEOUS
  Administered 2022-03-05: 5 [IU] via SUBCUTANEOUS
  Administered 2022-03-06 – 2022-03-09 (×4): 2 [IU] via SUBCUTANEOUS
  Administered 2022-03-11: 3 [IU] via SUBCUTANEOUS
  Administered 2022-03-12: 2 [IU] via SUBCUTANEOUS
  Filled 2022-02-26 (×12): qty 1

## 2022-02-26 MED ORDER — TRAZODONE HCL 100 MG PO TABS: 100.0000 mg | ORAL_TABLET | Freq: Every day | ORAL | Status: DC

## 2022-02-26 NOTE — Consult Note (Addendum)
[facility] Face-to-Face Psychiatry Consult   Reason for Consult:  "mania" Referring Physician:  Larinda Buttery Patient Identification: Rita Sullivan MRN:  [id number] Principal Diagnosis: Schizoaffective disorder, bipolar type [facility]) Diagnosis:  Principal Problem:   Schizoaffective disorder, bipolar type (HCC)   Total Time spent with patient: 45 minutes  Subjective:   Rita Sullivan is a 66 y.o. female patient admitted with increased energy/mania.  HPI: Patient presents to ED, brought by her husband.  She has had periods of increased energy, auditory hallucinations telling her to "go marketing."  Decreased sleep, 5 to 6 hours at night for the past month.  No changes in appetite.  On evaluation, patient is pleasant and cooperative.  She does have somewhat rapid speech.  Some difficulty with attention.  She denies suicidal or homicidal ideations.  She denies visual hallucinations.  Patient denies any alcohol or illicit drug use.  Patient states she is retired and likes to crochet but she has been "too busy to do that."  Patient reports that she sees an outpatient psychiatric provider who decreased her Abilify to 5 mg a day within the last 3 months.  She does not recall if there was a correlation between the change in her increased manic symptoms.  Collateral from husband: He states that the symptoms have actually been lasting for about 3 months.  He is very concerned about her because sometimes she will say that she wants to go shopping and she will end up in [place] or [place].  Has been desires want to be admitted to the hospital for stabilization and possibly some medication changes.  Patient meets criteria for inpatient psychiatric hospitalization.  Past Psychiatric History: Schizoaffective disorder; bipolar disorder  Risk to Self:   Risk to Others:   Prior Inpatient Therapy:   Prior Outpatient Therapy:    Past Medical History:  Past Medical History:  Diagnosis Date   Bipolar disorder (HCC)     Chronic kidney disease    Diabetes mellitus without complication (HCC)    Hypercholesteremia    Manic behavior (HCC)     Past Surgical History:  Procedure Laterality Date   COLONOSCOPY WITH PROPOFOL N/A 03/11/2015   Procedure: COLONOSCOPY WITH PROPOFOL;  Surgeon: Wallace Cullens, MD;  Location: [facility] ENDOSCOPY;  Service: Gastroenterology;  Laterality: N/A;   Family History:  Family History  Problem Relation Age of Onset   Diabetes Father    Diabetes Sister    Family Psychiatric  History:  Social History:  Social History   Substance and Sexual Activity  Alcohol Use Yes   Alcohol/week: 2.0 standard drinks of alcohol   Types: 2 Glasses of wine per week   Comment: Occ     Social History   Substance and Sexual Activity  Drug Use No    Social History   Socioeconomic History   Marital status: Married    Spouse name: Not on file   Number of children: Not on file   Years of education: Not on file   Highest education level: Not on file  Occupational History   Not on file  Tobacco Use   Smoking status: Never   Smokeless tobacco: Never  Substance and Sexual Activity   Alcohol use: Yes    Alcohol/week: 2.0 standard drinks of alcohol    Types: 2 Glasses of wine per week    Comment: Occ   Drug use: No   Sexual activity: Never  Other Topics Concern   Not on file  Social History Narrative  Not on file   Social Determinants of Health   Financial Resource Strain: Not on file  Food Insecurity: Not on file  Transportation Needs: Not on file  Physical Activity: Not on file  Stress: Not on file  Social Connections: Not on file   Additional Social History:    Allergies:   Allergies  Allergen Reactions   Beef-Derived Products Other (See Comments)    Does not eat beef  Update: Pt states that she does eat beef   Divalproex Sodium Other (See Comments)    "bad for my liver"   Pork-Derived Products Other (See Comments)    Does not eat pork Patient states that she does eat  pork   Penicillins Rash    Labs:  Results for orders placed or performed during the hospital encounter of 02/26/22 (from the past 48 hour(s))  Urine Drug Screen, Qualitative     Status: None   Collection Time: 02/26/22 10:23 AM  Result Value Ref Range   Tricyclic, Ur Screen NONE DETECTED NONE DETECTED   Amphetamines, Ur Screen NONE DETECTED NONE DETECTED   MDMA (Ecstasy)Ur Screen NONE DETECTED NONE DETECTED   Cocaine Metabolite,Ur [place] NONE DETECTED NONE DETECTED   Opiate, Ur Screen NONE DETECTED NONE DETECTED   Phencyclidine (PCP) Ur S NONE DETECTED NONE DETECTED   Cannabinoid 50 Ng, Ur [place] NONE DETECTED NONE DETECTED   Barbiturates, Ur Screen NONE DETECTED NONE DETECTED   Benzodiazepine, Ur Scrn NONE DETECTED NONE DETECTED   Methadone Scn, Ur NONE DETECTED NONE DETECTED    Comment: (NOTE) Tricyclics + metabolites, urine    Cutoff 1000 ng/mL Amphetamines + metabolites, urine  Cutoff 1000 ng/mL MDMA (Ecstasy), urine              Cutoff 500 ng/mL Cocaine Metabolite, urine          Cutoff 300 ng/mL Opiate + metabolites, urine        Cutoff 300 ng/mL Phencyclidine (PCP), urine         Cutoff 25 ng/mL Cannabinoid, urine                 Cutoff 50 ng/mL Barbiturates + metabolites, urine  Cutoff 200 ng/mL Benzodiazepine, urine              Cutoff 200 ng/mL Methadone, urine                   Cutoff 300 ng/mL  The urine drug screen provides only a preliminary, unconfirmed analytical test result and should not be used for non-medical purposes. Clinical consideration and professional judgment should be applied to any positive drug screen result due to possible interfering substances. A more specific alternate chemical method must be used in order to obtain a confirmed analytical result. Gas chromatography / mass spectrometry (GC/MS) is the preferred confirm atory method. Performed at [facility], [address] [place], [place] [address]   Comprehensive metabolic panel      Status: Abnormal   Collection Time: 02/26/22 10:39 AM  Result Value Ref Range   Sodium 128 (L) 135 - 145 mmol/L   Potassium 4.6 3.5 - 5.1 mmol/L   Chloride 95 (L) 98 - 111 mmol/L   CO2 24 22 - 32 mmol/L   Glucose, Bld 194 (H) 70 - 99 mg/dL    Comment: Glucose reference range applies only to samples taken after fasting for at least 8 hours.   BUN 11 8 - 23 mg/dL   Creatinine, Ser 6.040.80 0.44 -  1.00 mg/dL   Calcium 9.5 8.9 - 25.3 mg/dL   Total Protein 6.8 6.5 - 8.1 g/dL   Albumin 4.2 3.5 - 5.0 g/dL   AST 23 15 - 41 U/L   ALT 19 0 - 44 U/L   Alkaline Phosphatase 87 38 - 126 U/L   Total Bilirubin 0.7 0.3 - 1.2 mg/dL   GFR, Estimated >66 >44 mL/min    Comment: (NOTE) Calculated using the CKD-EPI Creatinine Equation (2021)    Anion gap 9 5 - 15    Comment: Performed at [facility], [address] [place], [place] [address]  Ethanol     Status: None   Collection Time: 02/26/22 10:39 AM  Result Value Ref Range   Alcohol, Ethyl (B) <10 <10 mg/dL    Comment: (NOTE) Lowest detectable limit for serum alcohol is 10 mg/dL.  For medical purposes only. Performed at [facility], [address] [place], [place] [address]   Salicylate level     Status: Abnormal   Collection Time: 02/26/22 10:39 AM  Result Value Ref Range   Salicylate Lvl <7.0 (L) 7.0 - 30.0 mg/dL    Comment: Performed at [facility], [address] [place], [place] [address]  Acetaminophen level     Status: Abnormal   Collection Time: 02/26/22 10:39 AM  Result Value Ref Range   Acetaminophen (Tylenol), Serum <10 (L) 10 - 30 ug/mL    Comment: (NOTE) Therapeutic concentrations vary significantly. A range of 10-30 ug/mL  may be an effective concentration for many patients. However, some  are best treated at concentrations outside of this range. Acetaminophen concentrations >150 ug/mL at 4 hours after ingestion  and >50 ug/mL at 12 hours after ingestion are often associated with  toxic  reactions.  Performed at [facility], [address] [place], [place] [address]   cbc     Status: None   Collection Time: 02/26/22 10:39 AM  Result Value Ref Range   WBC 9.2 4.0 - 10.5 K/uL   RBC 4.17 3.87 - 5.11 MIL/uL   Hemoglobin 12.8 12.0 - 15.0 g/dL   HCT 51.8 84.1 - 66.0 %   MCV 92.6 80.0 - 100.0 fL   MCH 30.7 26.0 - 34.0 pg   MCHC 33.2 30.0 - 36.0 g/dL   RDW 63.0 16.0 - 10.9 %   Platelets 332 150 - 400 K/uL   nRBC 0.0 0.0 - 0.2 %    Comment: Performed at [facility], [address], [place], [place] [address]  SARS Coronavirus 2 by RT PCR (hospital order, performed in [facility] hospital lab) *cepheid single result test* Anterior Nasal Swab     Status: None   Collection Time: 02/26/22  1:10 PM   Specimen: Anterior Nasal Swab  Result Value Ref Range   SARS Coronavirus 2 by RT PCR NEGATIVE NEGATIVE    Comment: (NOTE) SARS-CoV-2 target nucleic acids are NOT DETECTED.  The SARS-CoV-2 RNA is generally detectable in upper and lower respiratory specimens during the acute phase of infection. The lowest concentration of SARS-CoV-2 viral copies this assay can detect is 250 copies / mL. A negative result does not preclude SARS-CoV-2 infection and should not be used as the sole basis for treatment or other patient management decisions.  A negative result may occur with improper specimen collection / handling, submission of specimen other than nasopharyngeal swab, presence of viral mutation(s) within the areas targeted by this assay, and inadequate number of viral copies (<250  copies / mL). A negative result must be combined with clinical observations, patient history, and epidemiological information.  Fact Sheet for Patients:   https://www.patel.info/  Fact Sheet for Healthcare Providers: https://hall.com/  This test is not yet approved or  cleared by the [place] FDA and has been authorized for detection  and/or diagnosis of SARS-CoV-2 by FDA under an Emergency Use Authorization (EUA).  This EUA will remain in effect (meaning this test can be used) for the duration of the COVID-19 declaration under Section 564(b)(1) of the Act, 21 U.S.C. section 360bbb-3(b)(1), unless the authorization is terminated or revoked sooner.  Performed at [facility], [address] [place], [place] [address]     No current facility-administered medications for this encounter.   Current Outpatient Medications  Medication Sig Dispense Refill   cholecalciferol (VITAMIN D3) 25 MCG (1000 UNIT) tablet Take 2,000 Units by mouth daily.     glipiZIDE (GLUCOTROL) 5 MG tablet Take 5 mg by mouth every morning.     losartan (COZAAR) 25 MG tablet Take 25 mg by mouth daily.     zinc sulfate 220 (50 Zn) MG capsule Take 220 mg by mouth daily.     ARIPiprazole (ABILIFY) 10 MG tablet Take 10 mg by mouth every morning.     atorvastatin (LIPITOR) 40 MG tablet Take 40 mg by mouth daily.     carbamazepine (TEGRETOL) 200 MG tablet Take 2 tablets (400 mg total) by mouth 2 (two) times daily after a meal. 742 tablet 0   folic acid (FOLVITE) 1 MG tablet Take 1 mg by mouth daily.     metFORMIN (GLUCOPHAGE) 500 MG tablet Take 1 tablet (500 mg total) by mouth 2 (two) times daily with a meal. 60 tablet 0   traZODone (DESYREL) 100 MG tablet Take 1 tablet (100 mg total) by mouth at bedtime as needed for sleep. 30 tablet 0    Musculoskeletal: Strength & Muscle Tone: within normal limits Gait & Station: normal Patient leans: N/A   Psychiatric Specialty Exam:  Presentation  General Appearance:  Casual  Eye Contact: Fair  Speech: Clear and Coherent  Speech Volume: Normal  Handedness: Right   Mood and Affect  Mood: Anxious  Affect: Inappropriate   Thought Process  Thought Processes: Disorganized (Mild)  Descriptions of Associations:Intact  Orientation:Full (Time, Place and Person)  Thought  Content:Delusions  History of Schizophrenia/Schizoaffective disorder:Yes  Duration of Psychotic Symptoms:Greater than six months  Hallucinations:Hallucinations: Auditory Description of Auditory Hallucinations: "Go marketing"  Ideas of Reference:None  Suicidal Thoughts:Suicidal Thoughts: No  Homicidal Thoughts:Homicidal Thoughts: No   Sensorium  Memory: Immediate Fair  Judgment: Impaired  Insight: Good   Executive Functions  Concentration: Fair  Attention Span: Fair  Recall: Vincent of Knowledge: Fair  Language: Fair   Psychomotor Activity  Psychomotor Activity:Psychomotor Activity: Normal   Assets  Assets: Resilience; Social Support; Catering manager; Housing; Desire for Improvement   Sleep  Sleep:Sleep: Fair   Physical Exam: Physical Exam Vitals and nursing note reviewed.  HENT:     Head: Normocephalic.     Nose: No congestion or rhinorrhea.  Pulmonary:     Effort: Pulmonary effort is normal.  Musculoskeletal:        General: Normal range of motion.     Cervical back: Normal range of motion.  Skin:    General: Skin is dry.  Neurological:     Mental Status: She is alert and oriented to person, place, and time.  Psychiatric:  Mood and Affect: Affect is inappropriate.        Speech: Speech is rapid and pressured (mildly).        Behavior: Behavior is cooperative.        Thought Content: Thought content is not paranoid or delusional. Thought content does not include homicidal or suicidal ideation.        Cognition and Memory: Cognition normal.        Judgment: Judgment is impulsive.    Review of Systems  Constitutional: Negative.   Respiratory: Negative.    Musculoskeletal: Negative.   Skin: Negative.   Psychiatric/Behavioral:  Positive for hallucinations. Negative for memory loss, substance abuse and suicidal ideas. The patient is nervous/anxious. The patient does not have insomnia.        Schizoaffective  disorder, bipolar type   Blood pressure 119/72, pulse (!) 109, temperature 98.7 F (37.1 C), temperature source Oral, resp. rate 18, height 5\' 2"  (1.575 m), weight 75.3 kg, SpO2 98 %. Body mass index is 30.36 kg/m.  Treatment Plan Summary: Plan admit to inpatient psychiatry.  Restart medications after pharmacy reconciles.  Disposition: Recommend psychiatric Inpatient admission when medically cleared.  Marland Kitchen, NP 02/26/2022 2:02 PM

## 2022-02-26 NOTE — ED Notes (Signed)
Lunch given.

## 2022-02-26 NOTE — Tx Team (Signed)
Initial Treatment Plan 02/26/2022 4:25 PM Rita Sullivan OZY:[id number]    PATIENT STRESSORS: Other: Pt states she has AH's telling her to continually go to the store.     PATIENT STRENGTHS: Active sense of humor  Supportive family/friends    PATIENT IDENTIFIED PROBLEMS: Auditory hallucinations                     DISCHARGE CRITERIA:  Ability to meet basic life and health needs Adequate post-discharge living arrangements Improved stabilization in mood, thinking, and/or behavior Medical problems require only outpatient monitoring Motivation to continue treatment in a less acute level of care Need for constant or close observation no longer present Reduction of life-threatening or endangering symptoms to within safe limits Safe-care adequate arrangements made Verbal commitment to aftercare and medication compliance  PRELIMINARY DISCHARGE PLAN: Attend aftercare/continuing care group Attend PHP/IOP Return to previous living arrangement  PATIENT/FAMILY INVOLVEMENT: This treatment plan has been presented to and reviewed with the patient, Rita Sullivan and/or family member. The patient and/or family member has been given the opportunity to ask questions and make suggestions.  Ileene Musa, RN 02/26/2022, 4:25 PM

## 2022-02-26 NOTE — ED Notes (Signed)
Patient Items:  Red Flower Dress Green Purse White Sandals [employer], and rings(she place in purse)  ALL ITEMS HAVE BEEN PLACED IN BAG AND GIVEN TO HUSBAND RUBERTO.   [facility]ic[facility]

## 2022-02-26 NOTE — Progress Notes (Signed)
Admission:  Rita Sullivan is a 66 y/o female admitted voluntarily to geropsych unit from the emergency room at 1415. Patient is alert and oriented x 4.  Patient is cooperative with assessment.  She reports she came to the hospital for mania and auditory hallucinations - voices telling her to "go marketing."  Her goal during hospitalization is to "get her brain straightened out so it's not spinning."  Denies SI/HI and VH.  Denies pain. Skin check completed with Roland Rack, RN.  Faint scratches on right shoulder blade. No contraband found on patient. Patient oriented to the unit and provided dinner.  Patient present in the milieu with peers.   Patient has no belongings in locker.  Patient only brought eye glasses onto the unit.

## 2022-02-26 NOTE — ED Notes (Signed)
Provided water and encouraged urine sample

## 2022-02-26 NOTE — ED Notes (Signed)
Report to Heather, RN.

## 2022-02-26 NOTE — ED Provider Notes (Signed)
[facility] Provider Note    Event Date/Time   First MD Initiated Contact with Patient 02/26/22 1103     (approximate)   History   Chief Complaint Hallucinations   HPI  Rita Sullivan is a 66 y.o. female with past medical history of hyperlipidemia, diabetes, and schizoaffective disorder who presents to the ED for hallucinations.  Patient reports that she has been hearing voices for least the past 2 weeks, but also states "it may be longer, I do not know."  She states the voices tell her to "go to the market" but they do not tell her to harm herself or anyone else.  She denies any suicidal or homicidal ideation, states she has been taking her medications as prescribed.  She denies any alcohol or drug use, also denies any medical complaints at this time.     Physical Exam   Triage Vital Signs: ED Triage Vitals  Enc Vitals Group     BP 02/26/22 1032 119/72     Pulse Rate 02/26/22 1032 (!) 109     Resp 02/26/22 1032 18     Temp 02/26/22 1032 98.7 F (37.1 C)     Temp Source 02/26/22 1032 Oral     SpO2 02/26/22 1032 98 %     Weight 02/26/22 1034 166 lb (75.3 kg)     Height 02/26/22 1034 5\' 2"  (1.575 m)     Head Circumference --      Peak Flow --      Pain Score 02/26/22 1034 0     Pain Loc --      Pain Edu? --      Excl. in [place]? --     Most recent vital signs: Vitals:   02/26/22 1032  BP: 119/72  Pulse: (!) 109  Resp: 18  Temp: 98.7 F (37.1 C)  SpO2: 98%    Constitutional: Alert and oriented. Eyes: Conjunctivae are normal. Head: Atraumatic. Nose: No congestion/rhinnorhea. Mouth/Throat: Mucous membranes are moist.  Cardiovascular: Normal rate, regular rhythm. Grossly normal heart sounds.  2+ radial pulses bilaterally. Respiratory: Normal respiratory effort.  No retractions. Lungs CTAB. Gastrointestinal: Soft and nontender. No distention. Musculoskeletal: No lower extremity tenderness nor edema.  Neurologic:  Normal speech and  language. No gross focal neurologic deficits are appreciated.    ED Results / Procedures / Treatments   Labs (all labs ordered are listed, but only abnormal results are displayed) Labs Reviewed  COMPREHENSIVE METABOLIC PANEL - Abnormal; Notable for the following components:      Result Value   Sodium 128 (*)    Chloride 95 (*)    Glucose, Bld 194 (*)    All other components within normal limits  ETHANOL  CBC  SALICYLATE LEVEL  ACETAMINOPHEN LEVEL  URINE DRUG SCREEN, QUALITATIVE ([facility] ONLY)    PROCEDURES:  Critical Care performed: No  Procedures   MEDICATIONS ORDERED IN ED: Medications - No data to display   IMPRESSION / MDM / [place] / ED COURSE  I reviewed the triage vital signs and the nursing notes.                              66 y.o. female with past medical history of hyperlipidemia, diabetes, and schizoaffective disorder who presents to the ED complaining of auditory hallucinations for at least the past couple of weeks with no suicidal or homicidal ideation.  Patient's presentation is most consistent  with acute presentation with potential threat to life or bodily function.  Differential diagnosis includes, but is not limited to, psychosis, mania, depression, substance abuse, medication noncompliance.  Patient nontoxic-appearing and in no acute distress, vital signs remarkable for mild tachycardia but otherwise reassuring.  Patient denies any medical complaints and screening labs are remarkable only for mild hyponatremia with no other electrolyte abnormality or AKI.  LFTs are unremarkable and CBC without anemia or leukocytosis.  Patient may be medically cleared for psychiatric disposition, no indication for IVC at this time as patient does not appear to be a threat to herself or others.  The patient has been placed in psychiatric observation due to the need to provide a safe environment for the patient while obtaining psychiatric consultation and  evaluation, as well as ongoing medical and medication management to treat the patient's condition.  The patient has not been placed under full IVC at this time.      FINAL CLINICAL IMPRESSION(S) / ED DIAGNOSES   Final diagnoses:  Hallucinations     Rx / DC Orders   ED Discharge Orders     None        Note:  This document was prepared using Dragon voice recognition software and may include unintentional dictation errors.   Chesley Noon, MD 02/26/22 1125

## 2022-02-26 NOTE — ED Triage Notes (Signed)
Pt via POV from home. Pt c/o auditory hallucination, feeling manic, states she feels like running away. Pt has a hx of schizoaffective disorder and bipolar, states she has been taking medication as prescribed. Denies SI/HI. States it has been bad for about a week. Denies ETOH. Denies illegal substances. Pt is A&Ox4 and NAD. Pt is calm and cooperative.

## 2022-02-27 DIAGNOSIS — F250 Schizoaffective disorder, bipolar type: Secondary | ICD-10-CM

## 2022-02-27 LAB — GLUCOSE, CAPILLARY
Glucose-Capillary: 162 mg/dL — ABNORMAL HIGH (ref 70–99)
Glucose-Capillary: 129 mg/dL — ABNORMAL HIGH (ref 70–99)
Glucose-Capillary: 217 mg/dL — ABNORMAL HIGH (ref 70–99)
Glucose-Capillary: 272 mg/dL — ABNORMAL HIGH (ref 70–99)

## 2022-02-27 MED ORDER — CARBAMAZEPINE 200 MG PO TABS
400.0000 mg | ORAL_TABLET | Freq: Two times a day (BID) | ORAL | Status: DC
  Administered 2022-02-27 – 2022-03-06 (×14): 400 mg via ORAL
  Filled 2022-02-27 (×15): qty 2

## 2022-02-27 MED ORDER — ARIPIPRAZOLE 5 MG PO TABS
10.0000 mg | ORAL_TABLET | Freq: Every morning | ORAL | Status: DC
  Administered 2022-02-28 – 2022-03-10 (×11): 10 mg via ORAL
  Filled 2022-02-27 (×11): qty 2

## 2022-02-27 MED ORDER — ARIPIPRAZOLE 5 MG PO TABS
5.0000 mg | ORAL_TABLET | Freq: Once | ORAL | Status: AC
  Administered 2022-02-27: 5 mg via ORAL
  Filled 2022-02-27: qty 1

## 2022-02-27 NOTE — Group Note (Signed)
LCSW Group Therapy Note   Group Date: 02/27/2022 Start Time: 1400 End Time: 1500   Type of Therapy and Topic:  Group Therapy: Boundaries  Participation Level:  Did Not Attend  Description of Group: This group will address the use of boundaries in their personal lives. Patients will explore why boundaries are important, the difference between healthy and unhealthy boundaries, and negative and postive outcomes of different boundaries and will look at how boundaries can be crossed.  Patients will be encouraged to identify current boundaries in their own lives and identify what kind of boundary is being set. Facilitators will guide patients in utilizing problem-solving interventions to address and correct types boundaries being used and to address when no boundary is being used. Understanding and applying boundaries will be explored and addressed for obtaining and maintaining a balanced life. Patients will be encouraged to explore ways to assertively make their boundaries and needs known to significant others in their lives, using other group members and facilitator for role play, support, and feedback.  Therapeutic Goals:  1.  Patient will identify areas in their life where setting clear boundaries could be  used to improve their life.  2.  Patient will identify signs/triggers that a boundary is not being respected. 3.  Patient will identify two ways to set boundaries in order to achieve balance in  their lives: 4.  Patient will demonstrate ability to communicate their needs and set boundaries  through discussion and/or role plays  Summary of Patient Progress:   X  Therapeutic Modalities:   Cognitive Behavioral Therapy Solution-Focused Therapy  [name], [place] 02/27/2022  4:13 PM
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
## 2022-02-27 NOTE — BHH Suicide Risk Assessment (Signed)
[facility] Admission Suicide Risk Assessment   Nursing information obtained from:  Patient, Review of record Demographic factors:  Caucasian Current Mental Status:  NA Loss Factors:  NA Historical Factors:  NA Risk Reduction Factors:  Sense of responsibility to family, Living with another person, especially a relative  Total Time spent with patient: 1 hour Principal Problem: Schizoaffective disorder, bipolar type (HCC) Diagnosis:  Principal Problem:   Schizoaffective disorder, bipolar type (HCC)  Subjective Data:  Patient presents to ED, brought by her husband.  She has had periods of increased energy, auditory hallucinations telling her to "go marketing."  Decreased sleep, 5 to 6 hours at night for the past month.  No changes in appetite.  On evaluation, patient is pleasant and cooperative.  She does have somewhat rapid speech.  Some difficulty with attention.  She denies suicidal or homicidal ideations.  She denies visual hallucinations.  Patient denies any alcohol or illicit drug use.  Patient states she is retired and likes to crochet but she has been "too busy to do that."  Patient reports that she sees an outpatient psychiatric provider who decreased her Abilify to 5 mg a day within the last 3 months.  She does not recall if there was a correlation between the change in her increased manic symptoms.   Collateral from husband: He states that the symptoms have actually been lasting for about 3 months.  He is very concerned about her because sometimes she will say that she wants to go shopping and she will end up in [place] or [place].  Has been desires want to be admitted to the hospital for stabilization and possibly some medication changes.    The "Alcohol Use Disorders Identification Test", Guidelines for Use in Primary Care, Second Edition.  World Science writer [facility]). Score between 0-7:  no or low risk or alcohol related problems. Score between 8-15:  moderate risk of alcohol related  problems. Score between 16-19:  high risk of alcohol related problems. Score 20 or above:  warrants further diagnostic evaluation for alcohol dependence and treatment.   CLINICAL FACTORS:  Schizoaffective Disorder, Bipolar type  Musculoskeletal: Strength & Muscle Tone: within normal limits Gait & Station: normal Patient leans: N/A  Psychiatric Specialty Exam:  Presentation  General Appearance:  Casual  Eye Contact: Fair  Speech: Clear and Coherent  Speech Volume: Normal  Handedness: Right   Mood and Affect  Mood: Anxious  Affect: Inappropriate   Thought Process  Thought Processes: Disorganized (Mild)  Descriptions of Associations:Intact  Orientation:Full (Time, Place and Person)  Thought Content:Delusions  History of Schizophrenia/Schizoaffective disorder:No  Duration of Psychotic Symptoms:Greater than six months  Hallucinations:Hallucinations: Auditory Description of Auditory Hallucinations: "Go marketing"  Ideas of Reference:None  Suicidal Thoughts:Suicidal Thoughts: No  Homicidal Thoughts:Homicidal Thoughts: No   Sensorium  Memory: Immediate Fair  Judgment: Impaired  Insight: Good   Executive Functions  Concentration: Fair  Attention Span: Fair  Recall: Fair  Fund of Knowledge: Fair  Language: Fair   Psychomotor Activity  Psychomotor Activity: Psychomotor Activity: Normal   Assets  Assets: Resilience; Social Support; Health and safety inspector; Housing; Desire for Improvement   Sleep  Sleep: Sleep: Fair     Blood pressure 112/72, pulse 96, temperature 98.4 F (36.9 C), temperature source Oral, resp. rate 18, height 5\' 2"  (1.575 m), weight 76.4 kg, SpO2 98 %. Body mass index is 30.82 kg/m.   COGNITIVE FEATURES THAT CONTRIBUTE TO RISK:  None    SUICIDE RISK:   Minimal: No identifiable suicidal ideation.  Patients presenting with no risk factors but with morbid ruminations; may be classified as  minimal risk based on the severity of the depressive symptoms  PLAN OF CARE: See orders  I certify that inpatient services furnished can reasonably be expected to improve the patient's condition.   Parks Ranger, DO 02/27/2022, 10:05 AM

## 2022-02-27 NOTE — BHH Suicide Risk Assessment (Signed)
[place] INPATIENT:  Family/Significant Other Suicide Prevention Education  Suicide Prevention Education:  Education Completed; Rita Sullivan, husband, [phone] has been identified by the patient as the family member/significant other with whom the patient will be residing, and identified as the person(s) who will aid the patient in the event of a mental health crisis (suicidal ideations/suicide attempt).  With written consent from the patient, the family member/significant other has been provided the following suicide prevention education, prior to the and/or following the discharge of the patient.  The suicide prevention education provided includes the following: Suicide risk factors Suicide prevention and interventions National Suicide Hotline telephone number [facility] assessment telephone number [facility] Emergency Assistance [place] and/or Residential Mobile Crisis Unit telephone number  Request made of family/significant other to: Remove weapons (e.g., guns, rifles, knives), all items previously/currently identified as safety concern.   Remove drugs/medications (over-the-counter, prescriptions, illicit drugs), all items previously/currently identified as a safety concern.  The family member/significant other verbalizes understanding of the suicide prevention education information provided.  The family member/significant other agrees to remove the items of safety concern listed above.  CSW spoke with the patient's husband.  He reports that "she's throwing away kitchen utensils and been replacing them with plastic ones that are orange because she likes that color".  He reports that the patient has also "thrown away $100 dollars worth of our food from the freezer".  He reports that patient has been throwing things away "since July".  He reports that she "will run away and not come back. She's been in [place], [place], [place]".  He clarifies that this has been  over 30 years "that started mainly after the birth of our children".  He reports that patient is not a danger to self or others.  He reports that she is only a danger to self when she runs away.  He reports that he has weapons but patient does not have access to them, they are kept in a safe.     Rita Sullivan 02/27/2022, 11:28 AM

## 2022-02-27 NOTE — Progress Notes (Signed)
Patient spent most of the shift resting in bed. Patient denies SI/HI &AVH. She was cooperative with treatment and medications. She seemed to sleep well through out the night.

## 2022-02-27 NOTE — H&P (Signed)
Psychiatric Admission Assessment Adult  Patient Identification: Rita GeorgesLaura Kaye Sullivan MRN:  [id number] Date of Evaluation:  02/27/2022 Chief Complaint:  Schizoaffective disorder, bipolar type (HCC) [F25.0] Principal Diagnosis: Schizoaffective disorder, bipolar type (HCC) Diagnosis:  Principal Problem:   Schizoaffective disorder, bipolar type (HCC)  History of Present Illness: Rita Sullivan is a 66 year old white female who was voluntarily admitted to inpatient psychiatry for worsening mania.  Rita Sullivan has not been sleeping very well and has been spending a lot of money.  She has a history of schizoaffective disorder bipolar type.  She also complains of hearing voices and racing thoughts.  This all started when her Abilify was decreased about 3 months ago.  We discussed different medications and agreed to increase Abilify first and continue carbamazepine. She denies any suicidal ideation and does not have any past suicide attempts.  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  [place] in [place] in [place].  She lives at home with her husband.  She has 2 grown children one in [place] and 1 in [place].  She also has a history of diabetes.  She sees a PA at [facility].  INITIAL ER EVALUATION:  Patient presents to ED, brought by her husband.  She has had periods of increased energy, auditory hallucinations telling her to "go marketing."  Decreased sleep, 5 to 6 hours at night for the past month.  No changes in appetite.  On evaluation, patient is pleasant and cooperative.  She does have somewhat rapid speech.  Some difficulty with attention.  She denies suicidal or homicidal ideations.  She denies visual hallucinations.  Patient denies any alcohol or illicit drug use.  Patient states she is retired and likes to crochet but she has been "too busy to do that."  Patient reports that she sees an outpatient psychiatric provider who decreased her Abilify to 5 mg a day  within the last 3 months.  She does not recall if there was a correlation between the change in her increased manic symptoms.   Collateral from husband: He states that the symptoms have actually been lasting for about 3 months.  He is very concerned about her because sometimes she will say that she wants to go shopping and she will end up in [place] or [place].  Has been desires want to be admitted to the hospital for stabilization and possibly some medication changes.  Associated Signs/Symptoms: Depression Symptoms:   No Duration of Depression Symptoms: No  (Hypo) Manic Symptoms:  Delusions, Elevated Mood, Flight of Ideas, Licensed conveyancerinancial Extravagance, Impulsivity, Anxiety Symptoms:  Excessive Worry, Psychotic Symptoms:  Hallucinations: Auditory PTSD Symptoms: NA Total Time spent with patient: 1 hour  Past Psychiatric History: She has numerous past psychiatric admissions for psychosis and mania.  She has been on Geodon.  She tends to travel to distant destinations for no reason.  She has [facility] up [facility] and a past history of psychiatry in [place].  [facility]  Is the patient at risk to self? Yes.    Has the patient been a risk to self in the past 6 months? Yes.    Has the patient been a risk to self within the distant past? Yes.    Is the patient a risk to others? No.  Has the patient been a risk to others in the past 6 months? No.  Has the patient been a risk to others within the distant past? No.   [place] Scale:  [employer] Row  Admission (Current) from 02/26/2022 in [place] Most recent reading at 02/26/2022  4:24 PM ED from 02/26/2022 in [place] Most recent reading at 02/26/2022 10:35 AM ED from 10/18/2021 in [place] Most recent reading at 10/19/2021 10:02 AM  C-SSRS RISK CATEGORY No Risk No Risk No Risk        Prior Inpatient Therapy:    Prior Outpatient Therapy:    Alcohol Screening: Patient refused Alcohol Screening Tool:  (no) 1. How often do you have a drink containing alcohol?: Never 2. How many drinks containing alcohol do you have on a typical day when you are drinking?: 1 or 2 3. How often do you have six or more drinks on one occasion?: Never AUDIT-C Score: 0 Substance Abuse History in the last 12 months:  No. Consequences of Substance Abuse: NA Previous Psychotropic Medications: Yes  Psychological Evaluations: Yes  Past Medical History:  Past Medical History:  Diagnosis Date   Bipolar disorder ([place])    Chronic kidney disease    Diabetes mellitus without complication ([place])    Hypercholesteremia    Manic behavior ([place])     Past Surgical History:  Procedure Laterality Date   COLONOSCOPY WITH PROPOFOL N/A 03/11/2015   Procedure: COLONOSCOPY WITH PROPOFOL;  Surgeon: Hulen Luster, MD;  Location: [facility] ENDOSCOPY;  Service: Gastroenterology;  Laterality: N/A;   Family History:  Family History  Problem Relation Age of Onset   Diabetes Father    Diabetes Sister    Family Psychiatric  History: Unremarkable Tobacco Screening:   Social History:  Social History   Substance and Sexual Activity  Alcohol Use Yes   Alcohol/week: 2.0 standard drinks of alcohol   Types: 2 Glasses of wine per week   Comment: Occ     Social History   Substance and Sexual Activity  Drug Use No    Additional Social History: Marital status: Married Does patient have children?: Yes    Pain Medications: See PTA Prescriptions: See PTA Over the Counter: See PTA History of alcohol / drug use?: No history of alcohol / drug abuse Longest period of sobriety (when/how long): Unable to quantify                    Allergies:   Allergies  Allergen Reactions   Beef-Derived Products Other (See Comments)    Does not eat beef  Update: Pt states that she does eat beef   Divalproex Sodium Other (See Comments)    "bad for my  liver"   Pork-Derived Products Other (See Comments)    Does not eat pork Patient states that she does eat pork   Penicillins Rash   Lab Results:  Results for orders placed or performed during the hospital encounter of 02/26/22 (from the past 48 hour(s))  Glucose, capillary     Status: Abnormal   Collection Time: 02/26/22  4:34 PM  Result Value Ref Range   Glucose-Capillary 140 (H) 70 - 99 mg/dL    Comment: Glucose reference range applies only to samples taken after fasting for at least 8 hours.  Glucose, capillary     Status: Abnormal   Collection Time: 02/26/22  9:27 PM  Result Value Ref Range   Glucose-Capillary 136 (H) 70 - 99 mg/dL    Comment: Glucose reference range applies only to samples taken after fasting for at least 8 hours.  Glucose, capillary     Status: Abnormal   Collection  Time: 02/27/22  7:38 AM  Result Value Ref Range   Glucose-Capillary 162 (H) 70 - 99 mg/dL    Comment: Glucose reference range applies only to samples taken after fasting for at least 8 hours.    Blood Alcohol level:  Lab Results  Component Value Date   ETH <10 02/26/2022   ETH <10 10/18/2021    Metabolic Disorder Labs:  Lab Results  Component Value Date   HGBA1C 6.3 (H) 02/20/2017   MPG 134.11 02/20/2017   MPG 177 07/31/2016   Lab Results  Component Value Date   PROLACTIN 23.3 02/20/2017   PROLACTIN 3.6 (L) 07/31/2016   Lab Results  Component Value Date   CHOL 220 (H) 02/20/2017   TRIG 132 02/20/2017   HDL 63 02/20/2017   CHOLHDL 3.5 02/20/2017   VLDL 26 02/20/2017   LDLCALC 131 (H) 02/20/2017   LDLCALC 101 (H) 07/31/2016    Current Medications: Current Facility-Administered Medications  Medication Dose Route Frequency Provider Last Rate Last Admin   acetaminophen (TYLENOL) tablet 650 mg  650 mg Oral Q6H PRN Vanetta Mulders, NP       alum & mag hydroxide-simeth (MAALOX/MYLANTA) 200-200-20 MG/5ML suspension 30 mL  30 mL Oral Q4H PRN Vanetta Mulders, NP       [START ON  02/28/2022] ARIPiprazole (ABILIFY) tablet 10 mg  10 mg Oral q morning Sarina Ill, DO       ARIPiprazole (ABILIFY) tablet 5 mg  5 mg Oral Once Sarina Ill, DO       atorvastatin (LIPITOR) tablet 40 mg  40 mg Oral Daily Gabriel Cirri F, NP   40 mg at 02/27/22 1610   carbamazepine (TEGRETOL) tablet 400 mg  400 mg Oral BID PC Laterrica Libman, Lanny Cramp, DO       cholecalciferol (VITAMIN D3) 25 MCG (1000 UNIT) tablet 2,000 Units  2,000 Units Oral Daily Gabriel Cirri F, NP   2,000 Units at 02/27/22 0902   folic acid (FOLVITE) tablet 0.5 mg  500 mcg Oral Daily Gabriel Cirri F, NP   0.5 mg at 02/27/22 0902   glipiZIDE (GLUCOTROL) tablet 5 mg  5 mg Oral Q breakfast Gabriel Cirri F, NP   5 mg at 02/27/22 0800   hydrOXYzine (ATARAX) tablet 25 mg  25 mg Oral TID PRN Vanetta Mulders, NP       insulin aspart (novoLOG) injection 0-15 Units  0-15 Units Subcutaneous TID WC Gabriel Cirri F, NP   3 Units at 02/27/22 0801   insulin aspart (novoLOG) injection 0-5 Units  0-5 Units Subcutaneous QHS Gabriel Cirri F, NP       losartan (COZAAR) tablet 25 mg  25 mg Oral Daily Gabriel Cirri F, NP   25 mg at 02/27/22 0902   magnesium hydroxide (MILK OF MAGNESIA) suspension 30 mL  30 mL Oral Daily PRN Gabriel Cirri F, NP       metFORMIN (GLUCOPHAGE) tablet 1,000 mg  1,000 mg Oral BID WC Gabriel Cirri F, NP   1,000 mg at 02/27/22 0800   traZODone (DESYREL) tablet 100 mg  100 mg Oral QHS Gabriel Cirri F, NP   100 mg at 02/26/22 2129   PTA Medications: Medications Prior to Admission  Medication Sig Dispense Refill Last Dose   ARIPiprazole (ABILIFY) 10 MG tablet Take 5 mg by mouth every morning.      atorvastatin (LIPITOR) 40 MG tablet Take 40 mg by mouth daily.      carbamazepine (TEGRETOL) 200 MG  tablet Take 2 tablets (400 mg total) by mouth 2 (two) times daily after a meal. 120 tablet 0    cholecalciferol (VITAMIN D3) 25 MCG (1000 UNIT) tablet Take 2,000 Units by mouth  daily.      folic acid (FOLVITE) 400 MCG tablet Take 400 mcg by mouth daily.      glipiZIDE (GLUCOTROL) 5 MG tablet Take 5 mg by mouth every morning.      JARDIANCE 10 MG TABS tablet Take 10 mg by mouth daily.      losartan (COZAAR) 25 MG tablet Take 25 mg by mouth daily.      metFORMIN (GLUCOPHAGE) 500 MG tablet Take 1 tablet (500 mg total) by mouth 2 (two) times daily with a meal. (Patient taking differently: Take 1,000 mg by mouth 2 (two) times daily with a meal.) 60 tablet 0    Multiple Vitamins-Minerals (HAIR SKIN AND NAILS FORMULA) TABS Take 1 tablet by mouth daily.      traZODone (DESYREL) 100 MG tablet Take 1 tablet (100 mg total) by mouth at bedtime as needed for sleep. (Patient taking differently: Take 100 mg by mouth at bedtime.) 30 tablet 0    zinc sulfate 220 (50 Zn) MG capsule Take 220 mg by mouth daily.       Musculoskeletal: Strength & Muscle Tone: within normal limits Gait & Station: normal Patient leans: N/A            Psychiatric Specialty Exam:  Presentation  General Appearance:  Casual  Eye Contact: Fair  Speech: Clear and Coherent  Speech Volume: Normal  Handedness: Right   Mood and Affect  Mood: Anxious  Affect: Inappropriate   Thought Process  Thought Processes: Disorganized (Mild)  Duration of Psychotic Symptoms: Greater than six months  Past Diagnosis of Schizophrenia or Psychoactive disorder: No  Descriptions of Associations:Intact  Orientation:Full (Time, Place and Person)  Thought Content:Delusions  Hallucinations:Hallucinations: Auditory Description of Auditory Hallucinations: "Go marketing"  Ideas of Reference:None  Suicidal Thoughts:Suicidal Thoughts: No  Homicidal Thoughts:Homicidal Thoughts: No   Sensorium  Memory: Immediate Fair  Judgment: Impaired  Insight: Good   Executive Functions  Concentration: Fair  Attention Span: Fair  Recall: Fair  Fund of  Knowledge: Fair  Language: Fair   Psychomotor Activity  Psychomotor Activity: Psychomotor Activity: Normal   Assets  Assets: Resilience; Social Support; Health and safety inspector; Housing; Desire for Improvement   Sleep  Sleep: Sleep: Fair    Physical Exam: Physical Exam Constitutional:      Appearance: Normal appearance.  HENT:     Head: Normocephalic and atraumatic.     Mouth/Throat:     Pharynx: Oropharynx is clear.  Eyes:     Pupils: Pupils are equal, round, and reactive to light.  Cardiovascular:     Rate and Rhythm: Normal rate and regular rhythm.  Pulmonary:     Effort: Pulmonary effort is normal.     Breath sounds: Normal breath sounds.  Abdominal:     General: Abdomen is flat.     Palpations: Abdomen is soft.  Musculoskeletal:        General: Normal range of motion.  Skin:    General: Skin is warm and dry.  Neurological:     General: No focal deficit present.     Mental Status: She is alert. Mental status is at baseline.  Psychiatric:        Attention and Perception: She perceives auditory hallucinations.        Mood and Affect: Affect normal. Mood  is anxious.        Speech: Speech normal.        Behavior: Behavior is agitated. Behavior is cooperative.        Thought Content: Thought content normal.        Cognition and Memory: Cognition and memory normal.        Judgment: Judgment is impulsive.    Review of Systems  Constitutional: Negative.   HENT: Negative.    Eyes: Negative.   Respiratory: Negative.    Cardiovascular: Negative.   Gastrointestinal: Negative.   Genitourinary: Negative.   Musculoskeletal: Negative.   Skin: Negative.   Neurological: Negative.   Endo/Heme/Allergies: Negative.   Psychiatric/Behavioral:  The patient has insomnia.    Blood pressure 112/72, pulse 96, temperature 98.4 F (36.9 C), temperature source Oral, resp. rate 18, height 5\' 2"  (1.575 m), weight 76.4 kg, SpO2 98 %. Body mass index is 30.82  kg/m.  Treatment Plan Summary: Daily contact with patient to assess and evaluate symptoms and progress in treatment, Medication management, and Plan increase Abilify to 10 mg/day.  Tegretol level this morning.  Continue Tegretol.  Trazodone at bedtime.  Observation Level/Precautions:  15 minute checks  Laboratory:  CBC Chemistry Profile  Psychotherapy:    Medications:    Consultations:    Discharge Concerns:    Estimated LOS:  Other:     Physician Treatment Plan for Primary Diagnosis: Schizoaffective disorder, bipolar type (HCC) Long Term Goal(s): Improvement in symptoms so as ready for discharge  Short Term Goals: Ability to identify changes in lifestyle to reduce recurrence of condition will improve, Ability to verbalize feelings will improve, Ability to disclose and discuss suicidal ideas, Ability to demonstrate self-control will improve, Ability to identify and develop effective coping behaviors will improve, Ability to maintain clinical measurements within normal limits will improve, Compliance with prescribed medications will improve, and Ability to identify triggers associated with substance abuse/mental health issues will improve  Physician Treatment Plan for Secondary Diagnosis: Principal Problem:   Schizoaffective disorder, bipolar type (HCC)   I certify that inpatient services furnished can reasonably be expected to improve the patient's condition.    , DO 10/6/202310:09 AM

## 2022-02-27 NOTE — Progress Notes (Signed)
Patient alert and oriented x 4.  Patient with flat affect, but brightens with interaction.  Patient denies SI/HI and VH.  Endorses AH and racing thoughts.  Denies pain.  Patient has a delayed response when answering questions, but does give appropriate answers in time.   At times the patient's communication sounds child like.    15 min checks in place. Compliant with medications. No adverse effects noted.  Patient is safe on the unit.  Patient has been present in the milieu.  Appropriate interaction with peers.

## 2022-02-27 NOTE — BH IP Treatment Plan (Signed)
Interdisciplinary Treatment and Diagnostic Plan Update  02/27/2022 Time of Session: 9:45AM Rita Sullivan MRN: [id number]  Principal Diagnosis: Schizoaffective disorder, bipolar type [facility])  Secondary Diagnoses: Principal Problem:   Schizoaffective disorder, bipolar type ([place])   Current Medications:  Current Facility-Administered Medications  Medication Dose Route Frequency Provider Last Rate Last Admin   acetaminophen (TYLENOL) tablet 650 mg  650 mg Oral Q6H PRN Sherlon Handing, NP       alum & mag hydroxide-simeth (MAALOX/MYLANTA) 200-200-20 MG/5ML suspension 30 mL  30 mL Oral Q4H PRN Sherlon Handing, NP       [START ON 02/28/2022] ARIPiprazole (ABILIFY) tablet 10 mg  10 mg Oral q morning Parks Ranger, DO       atorvastatin (LIPITOR) tablet 40 mg  40 mg Oral Daily Waldon Merl F, NP   40 mg at 02/27/22 6579   carbamazepine (TEGRETOL) tablet 400 mg  400 mg Oral BID PC Parks Ranger, DO   400 mg at 02/27/22 1011   cholecalciferol (VITAMIN D3) 25 MCG (1000 UNIT) tablet 2,000 Units  2,000 Units Oral Daily Waldon Merl F, NP   2,000 Units at 03/83/33 8329   folic acid (FOLVITE) tablet 0.5 mg  500 mcg Oral Daily Waldon Merl F, NP   0.5 mg at 02/27/22 0902   glipiZIDE (GLUCOTROL) tablet 5 mg  5 mg Oral Q breakfast Waldon Merl F, NP   5 mg at 02/27/22 0800   hydrOXYzine (ATARAX) tablet 25 mg  25 mg Oral TID PRN Sherlon Handing, NP       insulin aspart (novoLOG) injection 0-15 Units  0-15 Units Subcutaneous TID WC Waldon Merl F, NP   3 Units at 02/27/22 0801   insulin aspart (novoLOG) injection 0-5 Units  0-5 Units Subcutaneous QHS Waldon Merl F, NP       losartan (COZAAR) tablet 25 mg  25 mg Oral Daily Waldon Merl F, NP   25 mg at 02/27/22 0902   magnesium hydroxide (MILK OF MAGNESIA) suspension 30 mL  30 mL Oral Daily PRN Waldon Merl F, NP       metFORMIN (GLUCOPHAGE) tablet 1,000 mg  1,000 mg Oral BID WC Waldon Merl F,  NP   1,000 mg at 02/27/22 0800   traZODone (DESYREL) tablet 100 mg  100 mg Oral QHS Waldon Merl F, NP   100 mg at 02/26/22 2129   PTA Medications: Medications Prior to Admission  Medication Sig Dispense Refill Last Dose   ARIPiprazole (ABILIFY) 10 MG tablet Take 5 mg by mouth every morning.      atorvastatin (LIPITOR) 40 MG tablet Take 40 mg by mouth daily.      carbamazepine (TEGRETOL) 200 MG tablet Take 2 tablets (400 mg total) by mouth 2 (two) times daily after a meal. 120 tablet 0    cholecalciferol (VITAMIN D3) 25 MCG (1000 UNIT) tablet Take 2,000 Units by mouth daily.      folic acid (FOLVITE) 191 MCG tablet Take 400 mcg by mouth daily.      glipiZIDE (GLUCOTROL) 5 MG tablet Take 5 mg by mouth every morning.      JARDIANCE 10 MG TABS tablet Take 10 mg by mouth daily.      losartan (COZAAR) 25 MG tablet Take 25 mg by mouth daily.      metFORMIN (GLUCOPHAGE) 500 MG tablet Take 1 tablet (500 mg total) by mouth 2 (two) times daily with a meal. (Patient taking differently: Take 1,000 mg by mouth  2 (two) times daily with a meal.) 60 tablet 0    Multiple Vitamins-Minerals (HAIR SKIN AND NAILS FORMULA) TABS Take 1 tablet by mouth daily.      traZODone (DESYREL) 100 MG tablet Take 1 tablet (100 mg total) by mouth at bedtime as needed for sleep. (Patient taking differently: Take 100 mg by mouth at bedtime.) 30 tablet 0    zinc sulfate 220 (50 Zn) MG capsule Take 220 mg by mouth daily.       Patient Stressors: Other: Pt states she has AH's telling her to continually go to the store.    Patient Strengths: Active sense of humor  Supportive family/friends   Treatment Modalities: Medication Management, Group therapy, Case management,  1 to 1 session with clinician, Psychoeducation, Recreational therapy.   Physician Treatment Plan for Primary Diagnosis: Schizoaffective disorder, bipolar type ([place]) Long Term Goal(s): Improvement in symptoms so as ready for discharge   Short Term Goals:  Ability to identify changes in lifestyle to reduce recurrence of condition will improve Ability to verbalize feelings will improve Ability to disclose and discuss suicidal ideas Ability to demonstrate self-control will improve Ability to identify and develop effective coping behaviors will improve Ability to maintain clinical measurements within normal limits will improve Compliance with prescribed medications will improve Ability to identify triggers associated with substance abuse/mental health issues will improve  Medication Management: Evaluate patient's response, side effects, and tolerance of medication regimen.  Therapeutic Interventions: 1 to 1 sessions, Unit Group sessions and Medication administration.  Evaluation of Outcomes: Not Met  Physician Treatment Plan for Secondary Diagnosis: Principal Problem:   Schizoaffective disorder, bipolar type ([place])  Long Term Goal(s): Improvement in symptoms so as ready for discharge   Short Term Goals: Ability to identify changes in lifestyle to reduce recurrence of condition will improve Ability to verbalize feelings will improve Ability to disclose and discuss suicidal ideas Ability to demonstrate self-control will improve Ability to identify and develop effective coping behaviors will improve Ability to maintain clinical measurements within normal limits will improve Compliance with prescribed medications will improve Ability to identify triggers associated with substance abuse/mental health issues will improve     Medication Management: Evaluate patient's response, side effects, and tolerance of medication regimen.  Therapeutic Interventions: 1 to 1 sessions, Unit Group sessions and Medication administration.  Evaluation of Outcomes: Not Met   RN Treatment Plan for Primary Diagnosis: Schizoaffective disorder, bipolar type ([place]) Long Term Goal(s): Knowledge of disease and therapeutic regimen to maintain health will improve  Short  Term Goals: Ability to demonstrate self-control, Ability to participate in decision making will improve, Ability to verbalize feelings will improve, Ability to identify and develop effective coping behaviors will improve, and Compliance with prescribed medications will improve  Medication Management: RN will administer medications as ordered by provider, will assess and evaluate patient's response and provide education to patient for prescribed medication. RN will report any adverse and/or side effects to prescribing provider.  Therapeutic Interventions: 1 on 1 counseling sessions, Psychoeducation, Medication administration, Evaluate responses to treatment, Monitor vital signs and CBGs as ordered, Perform/monitor CIWA, COWS, AIMS and Fall Risk screenings as ordered, Perform wound care treatments as ordered.  Evaluation of Outcomes: Not Met   LCSW Treatment Plan for Primary Diagnosis: Schizoaffective disorder, bipolar type ([place]) Long Term Goal(s): Safe transition to appropriate next level of care at discharge, Engage patient in therapeutic group addressing interpersonal concerns.  Short Term Goals: Engage patient in aftercare planning with referrals and resources, Increase social  support, Increase ability to appropriately verbalize feelings, Increase emotional regulation, Facilitate acceptance of mental health diagnosis and concerns, and Increase skills for wellness and recovery  Therapeutic Interventions: Assess for all discharge needs, 1 to 1 time with Social worker, Explore available resources and support systems, Assess for adequacy in community support network, Educate family and significant other(s) on suicide prevention, Complete Psychosocial Assessment, Interpersonal group therapy.  Evaluation of Outcomes: Not Met   Progress in Treatment: Attending groups: No. Participating in groups: No. Taking medication as prescribed: Yes. Toleration medication: Yes. Family/Significant other contact  made: Yes, individual(s) contacted:  SPE completed with the patient's husband Patient understands diagnosis: Yes. Discussing patient identified problems/goals with staff: Yes. Medical problems stabilized or resolved: Yes. Denies suicidal/homicidal ideation: Yes. Issues/concerns per patient self-inventory: No. Other: none  New problem(s) identified: No, Describe:  none  New Short Term/Long Term Goal(s): elimination of symptoms of psychosis, medication management for mood stabilization; elimination of SI thoughts; development of comprehensive mental wellness plan.    Patient Goals:  "get better"  Discharge Plan or Barriers: CSW to assist patient in development of appropriate discharge plans.  Patient currently thinking of returning home and maintaining current mental health provider.   Reason for Continuation of Hospitalization: Anxiety Depression Hallucinations Mania Medication stabilization  Estimated Length of Stay:  1-7 days  Last 3 [place] Suicide Severity Risk Score: Flowsheet Row Admission (Current) from 02/26/2022 in [place] Most recent reading at 02/27/2022 11:00 AM ED from 02/26/2022 in [place] Most recent reading at 02/26/2022 10:35 AM ED from 10/18/2021 in [place] Most recent reading at 10/19/2021 10:02 AM  C-SSRS RISK CATEGORY No Risk No Risk No Risk       Last PHQ 2/9 Scores:    05/02/2015    1:50 PM  Depression screen PHQ 2/9  Decreased Interest 0  Down, Depressed, Hopeless 0  PHQ - 2 Score 0    Scribe for Treatment Team: [name], LCSW 02/27/2022 11:57 AM

## 2022-02-27 NOTE — Plan of Care (Signed)
  Problem: Activity: Goal: Risk for activity intolerance will decrease Outcome: Progressing   Problem: Nutrition: Goal: Adequate nutrition will be maintained Outcome: Progressing   Problem: Education: Goal: Mental status will improve Outcome: Not Progressing   Problem: Activity: Goal: Interest or engagement in activities will improve Outcome: Not Progressing

## 2022-02-28 DIAGNOSIS — F250 Schizoaffective disorder, bipolar type: Secondary | ICD-10-CM | POA: Diagnosis not present

## 2022-02-28 LAB — GLUCOSE, CAPILLARY
Glucose-Capillary: 242 mg/dL — ABNORMAL HIGH (ref 70–99)
Glucose-Capillary: 212 mg/dL — ABNORMAL HIGH (ref 70–99)
Glucose-Capillary: 225 mg/dL — ABNORMAL HIGH (ref 70–99)
Glucose-Capillary: 189 mg/dL — ABNORMAL HIGH (ref 70–99)

## 2022-02-28 NOTE — Progress Notes (Signed)
Patient ID: Rita Sullivan, female   DOB: 1955-06-11, 66 y.o.   MRN: [id number] Pt presents with pleasant mood, affect congruent. Rita Sullivan is disorganized in her thoughts, sharing when asked if she has any concerns for the doctor she states '' well I guess I will ask him what flavor icecream to order. ''  Patient denies any SI or HI or A/V Hallucinations but does exhibit thought blocking in her communication as there are pauses in her vocalizing thoughts. Pt has been compliant with meds and meals throughout shift thus far. Pt is safe. Able to make needs known. Will con't to monitor.

## 2022-02-28 NOTE — BH Assessment (Signed)
1925 Received patient walking down the hallway alert. She entered the day room but sat alone did not see her interact with anyone. Will interview the patient when she is in a more private area.  Late entry: Patient BS was 189 she did not received any coverage.  0630 Patient was up several times during the night to request cups of water. Has laid in bed but did not sleep very well thorughout the shift.

## 2022-02-28 NOTE — Plan of Care (Signed)

## 2022-02-28 NOTE — BHH Group Notes (Signed)
PsychoEducational Group- Patients were given several inspirational poems and asked to explore healthy coping mechanisms in response to stress, as poems related to stress and healthy relationships.  Pt attended and was appropriate, engaged, but disorganized in her thoughts.

## 2022-02-28 NOTE — BHH Group Notes (Signed)
Pt attended recreation therapy with karaoke, music and dance.

## 2022-02-28 NOTE — Progress Notes (Signed)
[facility] MD Progress Note  02/28/2022 2:39 PM Rita Sullivan  MRN:  [id number] Subjective: Follow-up for 66 year old woman with schizoaffective disorder.  Patient has no new complaints.  States her mood feels fine.  Denies current hallucinations.  Denies suicidal or homicidal ideation.  Interacting appropriately on the unit. Principal Problem: Schizoaffective disorder, bipolar type (HCC) Diagnosis: Principal Problem:   Schizoaffective disorder, bipolar type (H[facility])  Total Time spent with patient: 30 minutes  Past Psychiatric History: Past history of schizoaffective disorder  Past Medical History:  Past Medical History:  Diagnosis Date   Bipolar disorder (HCC)    Chronic kidney disease    Diabetes mellitus without complication (HCC)    Hypercholesteremia    Manic behavior (HCC)     Past Surgical History:  Procedure Laterality Date   COLONOSCOPY WITH PROPOFOL N/A 03/11/2015   Procedure: COLONOSCOPY WITH PROPOFOL;  Surgeon: Wallace Cullens, MD;  Location: [facility] ENDOSCOPY;  Service: Gastroenterology;  Laterality: N/A;   Family History:  Family History  Problem Relation Age of Onset   Diabetes Father    Diabetes Sister    Family Psychiatric  History: See previous Social History:  Social History   Substance and Sexual Activity  Alcohol Use Yes   Alcohol/week: 2.0 standard drinks of alcohol   Types: 2 Glasses of wine per week   Comment: Occ     Social History   Substance and Sexual Activity  Drug Use No    Social History   Socioeconomic History   Marital status: Married    Spouse name: Not on file   Number of children: Not on file   Years of education: Not on file   Highest education level: Not on file  Occupational History   Not on file  Tobacco Use   Smoking status: Never   Smokeless tobacco: Never  Substance and Sexual Activity   Alcohol use: Yes    Alcohol/week: 2.0 standard drinks of alcohol    Types: 2 Glasses of wine per week    Comment: Occ   Drug use: No    Sexual activity: Never  Other Topics Concern   Not on file  Social History Narrative   Not on file   Social Determinants of Health   Financial Resource Strain: Not on file  Food Insecurity: No Food Insecurity (02/26/2022)   Hunger Vital Sign    Worried About Running Out of Food in the Last Year: Never true    Ran Out of Food in the Last Year: Never true  Transportation Needs: No Transportation Needs (02/26/2022)   PRAPARE - Administrator, Civil Service (Medical): No    Lack of Transportation (Non-Medical): No  Physical Activity: Not on file  Stress: Not on file  Social Connections: Not on file   Additional Social History:    Pain Medications: See PTA Prescriptions: See PTA Over the Counter: See PTA History of alcohol / drug use?: No history of alcohol / drug abuse Longest period of sobriety (when/how long): Unable to quantify                    Sleep: Fair  Appetite:  Fair  Current Medications: Current Facility-Administered Medications  Medication Dose Route Frequency Provider Last Rate Last Admin   acetaminophen (TYLENOL) tablet 650 mg  650 mg Oral Q6H PRN Vanetta Mulders, NP       alum & mag hydroxide-simeth (MAALOX/MYLANTA) 200-200-20 MG/5ML suspension 30 mL  30 mL Oral Q4H PRN Barthold,  Lanny Hurst, NP       ARIPiprazole (ABILIFY) tablet 10 mg  10 mg Oral q morning Parks Ranger, DO   10 mg at 02/28/22 0835   atorvastatin (LIPITOR) tablet 40 mg  40 mg Oral Daily Waldon Merl F, NP   40 mg at 02/28/22 2297   carbamazepine (TEGRETOL) tablet 400 mg  400 mg Oral BID PC Parks Ranger, DO   400 mg at 02/28/22 9892   cholecalciferol (VITAMIN D3) 25 MCG (1000 UNIT) tablet 2,000 Units  2,000 Units Oral Daily Waldon Merl F, NP   2,000 Units at 11/94/17 4081   folic acid (FOLVITE) tablet 0.5 mg  500 mcg Oral Daily Waldon Merl F, NP   0.5 mg at 02/28/22 0835   glipiZIDE (GLUCOTROL) tablet 5 mg  5 mg Oral Q breakfast Waldon Merl F, NP   5 mg at 02/28/22 4481   hydrOXYzine (ATARAX) tablet 25 mg  25 mg Oral TID PRN Sherlon Handing, NP       insulin aspart (novoLOG) injection 0-15 Units  0-15 Units Subcutaneous TID WC Waldon Merl F, NP   5 Units at 02/28/22 1151   insulin aspart (novoLOG) injection 0-5 Units  0-5 Units Subcutaneous QHS Waldon Merl F, NP   3 Units at 02/28/22 0238   losartan (COZAAR) tablet 25 mg  25 mg Oral Daily Waldon Merl F, NP   25 mg at 02/28/22 8563   magnesium hydroxide (MILK OF MAGNESIA) suspension 30 mL  30 mL Oral Daily PRN Waldon Merl F, NP       metFORMIN (GLUCOPHAGE) tablet 1,000 mg  1,000 mg Oral BID WC Waldon Merl F, NP   1,000 mg at 02/28/22 1497   traZODone (DESYREL) tablet 100 mg  100 mg Oral QHS Waldon Merl F, NP   100 mg at 02/27/22 2142    Lab Results:  Results for orders placed or performed during the hospital encounter of 02/26/22 (from the past 48 hour(s))  Glucose, capillary     Status: Abnormal   Collection Time: 02/26/22  4:34 PM  Result Value Ref Range   Glucose-Capillary 140 (H) 70 - 99 mg/dL    Comment: Glucose reference range applies only to samples taken after fasting for at least 8 hours.  Glucose, capillary     Status: Abnormal   Collection Time: 02/26/22  9:27 PM  Result Value Ref Range   Glucose-Capillary 136 (H) 70 - 99 mg/dL    Comment: Glucose reference range applies only to samples taken after fasting for at least 8 hours.  Glucose, capillary     Status: Abnormal   Collection Time: 02/27/22  7:38 AM  Result Value Ref Range   Glucose-Capillary 162 (H) 70 - 99 mg/dL    Comment: Glucose reference range applies only to samples taken after fasting for at least 8 hours.  Glucose, capillary     Status: Abnormal   Collection Time: 02/27/22 11:17 AM  Result Value Ref Range   Glucose-Capillary 129 (H) 70 - 99 mg/dL    Comment: Glucose reference range applies only to samples taken after fasting for at least 8 hours.  Glucose,  capillary     Status: Abnormal   Collection Time: 02/27/22  4:08 PM  Result Value Ref Range   Glucose-Capillary 217 (H) 70 - 99 mg/dL    Comment: Glucose reference range applies only to samples taken after fasting for at least 8 hours.  Glucose, capillary     Status:  Abnormal   Collection Time: 02/27/22  8:05 PM  Result Value Ref Range   Glucose-Capillary 272 (H) 70 - 99 mg/dL    Comment: Glucose reference range applies only to samples taken after fasting for at least 8 hours.  Glucose, capillary     Status: Abnormal   Collection Time: 02/28/22  8:00 AM  Result Value Ref Range   Glucose-Capillary 242 (H) 70 - 99 mg/dL    Comment: Glucose reference range applies only to samples taken after fasting for at least 8 hours.  Glucose, capillary     Status: Abnormal   Collection Time: 02/28/22 11:46 AM  Result Value Ref Range   Glucose-Capillary 212 (H) 70 - 99 mg/dL    Comment: Glucose reference range applies only to samples taken after fasting for at least 8 hours.    Blood Alcohol level:  Lab Results  Component Value Date   ETH <10 02/26/2022   ETH <10 10/18/2021    Metabolic Disorder Labs: Lab Results  Component Value Date   HGBA1C 6.3 (H) 02/20/2017   MPG 134.11 02/20/2017   MPG 177 07/31/2016   Lab Results  Component Value Date   PROLACTIN 23.3 02/20/2017   PROLACTIN 3.6 (L) 07/31/2016   Lab Results  Component Value Date   CHOL 220 (H) 02/20/2017   TRIG 132 02/20/2017   HDL 63 02/20/2017   CHOLHDL 3.5 02/20/2017   VLDL 26 02/20/2017   LDLCALC 131 (H) 02/20/2017   LDLCALC 101 (H) 07/31/2016    Physical Findings: AIMS:  , ,  ,  ,    CIWA:    COWS:     Musculoskeletal: Strength & Muscle Tone: within normal limits Gait & Station: normal Patient leans: N/A  Psychiatric Specialty Exam:  Presentation  General Appearance:  Casual  Eye Contact: Fair  Speech: Clear and Coherent  Speech Volume: Normal  Handedness: Right   Mood and Affect   Mood: Anxious  Affect: Inappropriate   Thought Process  Thought Processes: Disorganized (Mild)  Descriptions of Associations:Intact  Orientation:Full (Time, Place and Person)  Thought Content:Delusions  History of Schizophrenia/Schizoaffective disorder:No  Duration of Psychotic Symptoms:Greater than six months  Hallucinations:No data recorded Ideas of Reference:None  Suicidal Thoughts:No data recorded Homicidal Thoughts:No data recorded  Sensorium  Memory: Immediate Fair  Judgment: Impaired  Insight: Good   Executive Functions  Concentration: Fair  Attention Span: Fair  Recall: [employer] of Knowledge: Fair  Language: Fair   Psychomotor Activity  Psychomotor Activity:No data recorded  Assets  Assets: Resilience; Social Support; Health and safety inspector; Housing; Desire for Improvement   Sleep  Sleep:No data recorded   Physical Exam: Physical Exam Vitals and nursing note reviewed.  Constitutional:      Appearance: Normal appearance.  HENT:     Head: Normocephalic and atraumatic.     Mouth/Throat:     Pharynx: Oropharynx is clear.  Eyes:     Pupils: Pupils are equal, round, and reactive to light.  Cardiovascular:     Rate and Rhythm: Normal rate and regular rhythm.  Pulmonary:     Effort: Pulmonary effort is normal.     Breath sounds: Normal breath sounds.  Abdominal:     General: Abdomen is flat.     Palpations: Abdomen is soft.  Musculoskeletal:        General: Normal range of motion.  Skin:    General: Skin is warm and dry.  Neurological:     General: No focal deficit present.  Mental Status: She is alert. Mental status is at baseline.  Psychiatric:        Mood and Affect: Mood normal.        Thought Content: Thought content normal.    Review of Systems  Constitutional: Negative.   HENT: Negative.    Eyes: Negative.   Respiratory: Negative.    Cardiovascular: Negative.   Gastrointestinal: Negative.    Musculoskeletal: Negative.   Skin: Negative.   Neurological: Negative.   Psychiatric/Behavioral: Negative.     Blood pressure 112/69, pulse 93, temperature 98.3 F (36.8 C), temperature source Oral, resp. rate 18, height 5\' 2"  (1.575 m), weight 76.4 kg, SpO2 99 %. Body mass index is 30.82 kg/m.   Treatment Plan Summary: Plan no change to medication.  Supportive counseling and encouragement.  Patient going outdoors with group today interacting appropriately appears to be stable  , MD 02/28/2022, 2:39 PM

## 2022-02-28 NOTE — BH Assessment (Signed)
2020 Receive patient in her room she is alert and oriented x2. She is disheveled and denying SI/HI, A/H hallucinations, depression. Rates her mood as being "OK". Patient does seem to process conversation slower and with delayed responses.  0130  Patient has been up to the desk several times to request a drink of water. Patient educated during medication administration on the importance of staying hydrated when blood glucose is elevated.   0630 Patient has rested quietly in bed since about 0230. Will continue to monitor patient for safety.

## 2022-03-01 DIAGNOSIS — F250 Schizoaffective disorder, bipolar type: Secondary | ICD-10-CM | POA: Diagnosis not present

## 2022-03-01 LAB — GLUCOSE, CAPILLARY
Glucose-Capillary: 264 mg/dL — ABNORMAL HIGH (ref 70–99)
Glucose-Capillary: 238 mg/dL — ABNORMAL HIGH (ref 70–99)
Glucose-Capillary: 250 mg/dL — ABNORMAL HIGH (ref 70–99)
Glucose-Capillary: 238 mg/dL — ABNORMAL HIGH (ref 70–99)

## 2022-03-01 NOTE — Plan of Care (Signed)
  Problem: Coping: Goal: Level of anxiety will decrease Outcome: Progressing   Problem: Pain Managment: Goal: General experience of comfort will improve Outcome: Progressing   Problem: Safety: Goal: Ability to remain free from injury will improve Outcome: Progressing   

## 2022-03-01 NOTE — Group Note (Signed)
[facility] LCSW Group Therapy Note   Group Date: 03/01/2022 Start Time: 1300 End Time: 1400   Type of Therapy and Topic: Group Therapy: Avoiding Self-Sabotaging and Enabling Behaviors  Participation Level: Minimal  Mood: Depressed   Description of Group:  In this group, patients will learn how to identify obstacles, self-sabotaging and enabling behaviors, as well as: what are they, why do we do them and what needs these behaviors meet. Discuss unhealthy relationships and how to have positive healthy boundaries with those that sabotage and enable. Explore aspects of self-sabotage and enabling in yourself and how to limit these self-destructive behaviors in everyday life.   Therapeutic Goals: 1. Patient will identify one obstacle that relates to self-sabotage and enabling behaviors 2. Patient will identify one personal self-sabotaging or enabling behavior they did prior to admission 3. Patient will state a plan to change the above identified behavior 4. Patient will demonstrate ability to communicate their needs through discussion and/or role play.    Summary of Patient Progress: Patient was present for the entirety of group session. Patient participated in opening and closing remarks. However, patient did not contribute at all to the topic of discussion despite encouraged participation. Patient did listen to topic diligently and seemed to agree with point made based on observed body language and tone.    Therapeutic Modalities:  Cognitive Behavioral Therapy Person-Centered Therapy Motivational Interviewing    Durenda Hurt, [place]

## 2022-03-01 NOTE — Progress Notes (Signed)
D: Pt alert and oriented. Pt denies experiencing any anxiety/depression at this time. Pt denies experiencing any pain at this time. Pt denies experiencing any SI/HI, or AVH at this time.    A: Scheduled medications administered. Support and encouragement provided.  Routine safety checks conducted q15 minutes.   R: No adverse drug reactions noted. Pt verbally contracts for safety at this time. Pt complaint with medications. Pt interacts appropriately with others on the unit. Pt remains safe at this time, Will continue plan of care.

## 2022-03-01 NOTE — Progress Notes (Signed)
[facility] MD Progress Note  03/01/2022 12:56 PM Rita Sullivan  MRN:  [id number] Subjective: Patient seen for follow-up.  66 year old woman with schizophrenia or schizoaffective disorder.  She has no new complaints today.  She is not presenting any active behavior problems on the unit.  Tolerating medicine well.  No reports of any dangerousness. Principal Problem: Schizoaffective disorder, bipolar type (HCC) Diagnosis: Principal Problem:   Schizoaffective disorder, bipolar type (HCC)  Total Time spent with patient: 20 minutes  Past Psychiatric History: Past history of schizoaffective disorder  Past Medical History:  Past Medical History:  Diagnosis Date   Bipolar disorder (HCC)    Chronic kidney disease    Diabetes mellitus without complication (HCC)    Hypercholesteremia    Manic behavior (HCC)     Past Surgical History:  Procedure Laterality Date   COLONOSCOPY WITH PROPOFOL N/A 03/11/2015   Procedure: COLONOSCOPY WITH PROPOFOL;  Surgeon: Wallace Cullens, MD;  Location: [facility] ENDOSCOPY;  Service: Gastroenterology;  Laterality: N/A;   Family History:  Family History  Problem Relation Age of Onset   Diabetes Father    Diabetes Sister    Family Psychiatric  History: See previous Social History:  Social History   Substance and Sexual Activity  Alcohol Use Yes   Alcohol/week: 2.0 standard drinks of alcohol   Types: 2 Glasses of wine per week   Comment: Occ     Social History   Substance and Sexual Activity  Drug Use No    Social History   Socioeconomic History   Marital status: Married    Spouse name: Not on file   Number of children: Not on file   Years of education: Not on file   Highest education level: Not on file  Occupational History   Not on file  Tobacco Use   Smoking status: Never   Smokeless tobacco: Never  Substance and Sexual Activity   Alcohol use: Yes    Alcohol/week: 2.0 standard drinks of alcohol    Types: 2 Glasses of wine per week    Comment: Occ    Drug use: No   Sexual activity: Never  Other Topics Concern   Not on file  Social History Narrative   Not on file   Social Determinants of Health   Financial Resource Strain: Not on file  Food Insecurity: No Food Insecurity (02/26/2022)   Hunger Vital Sign    Worried About Running Out of Food in the Last Year: Never true    Ran Out of Food in the Last Year: Never true  Transportation Needs: No Transportation Needs (02/26/2022)   PRAPARE - Administrator, Civil Service (Medical): No    Lack of Transportation (Non-Medical): No  Physical Activity: Not on file  Stress: Not on file  Social Connections: Not on file   Additional Social History:    Pain Medications: See PTA Prescriptions: See PTA Over the Counter: See PTA History of alcohol / drug use?: No history of alcohol / drug abuse Longest period of sobriety (when/how long): Unable to quantify                    Sleep: Fair  Appetite:  Fair  Current Medications: Current Facility-Administered Medications  Medication Dose Route Frequency Provider Last Rate Last Admin   acetaminophen (TYLENOL) tablet 650 mg  650 mg Oral Q6H PRN Vanetta Mulders, NP       alum & mag hydroxide-simeth (MAALOX/MYLANTA) 200-200-20 MG/5ML suspension 30 mL  30 mL Oral Q4H PRN Gabriel Cirri F, NP       ARIPiprazole (ABILIFY) tablet 10 mg  10 mg Oral q morning Sarina Ill, DO   10 mg at 03/01/22 0804   atorvastatin (LIPITOR) tablet 40 mg  40 mg Oral Daily Gabriel Cirri F, NP   40 mg at 03/01/22 0804   carbamazepine (TEGRETOL) tablet 400 mg  400 mg Oral BID PC Sarina Ill, DO   400 mg at 03/01/22 0801   cholecalciferol (VITAMIN D3) 25 MCG (1000 UNIT) tablet 2,000 Units  2,000 Units Oral Daily Gabriel Cirri F, NP   2,000 Units at 03/01/22 0805   folic acid (FOLVITE) tablet 0.5 mg  500 mcg Oral Daily Gabriel Cirri F, NP   0.5 mg at 03/01/22 0805   glipiZIDE (GLUCOTROL) tablet 5 mg  5 mg Oral Q  breakfast Gabriel Cirri F, NP   5 mg at 03/01/22 0801   hydrOXYzine (ATARAX) tablet 25 mg  25 mg Oral TID PRN Vanetta Mulders, NP       insulin aspart (novoLOG) injection 0-15 Units  0-15 Units Subcutaneous TID WC Gabriel Cirri F, NP   5 Units at 03/01/22 1209   insulin aspart (novoLOG) injection 0-5 Units  0-5 Units Subcutaneous QHS Gabriel Cirri F, NP   3 Units at 02/28/22 0238   losartan (COZAAR) tablet 25 mg  25 mg Oral Daily Gabriel Cirri F, NP   25 mg at 03/01/22 0804   magnesium hydroxide (MILK OF MAGNESIA) suspension 30 mL  30 mL Oral Daily PRN Gabriel Cirri F, NP       metFORMIN (GLUCOPHAGE) tablet 1,000 mg  1,000 mg Oral BID WC Gabriel Cirri F, NP   1,000 mg at 03/01/22 0804   traZODone (DESYREL) tablet 100 mg  100 mg Oral QHS Gabriel Cirri F, NP   100 mg at 02/28/22 2140    Lab Results:  Results for orders placed or performed during the hospital encounter of 02/26/22 (from the past 48 hour(s))  Glucose, capillary     Status: Abnormal   Collection Time: 02/27/22  4:08 PM  Result Value Ref Range   Glucose-Capillary 217 (H) 70 - 99 mg/dL    Comment: Glucose reference range applies only to samples taken after fasting for at least 8 hours.  Glucose, capillary     Status: Abnormal   Collection Time: 02/27/22  8:05 PM  Result Value Ref Range   Glucose-Capillary 272 (H) 70 - 99 mg/dL    Comment: Glucose reference range applies only to samples taken after fasting for at least 8 hours.  Glucose, capillary     Status: Abnormal   Collection Time: 02/28/22  8:00 AM  Result Value Ref Range   Glucose-Capillary 242 (H) 70 - 99 mg/dL    Comment: Glucose reference range applies only to samples taken after fasting for at least 8 hours.  Glucose, capillary     Status: Abnormal   Collection Time: 02/28/22 11:46 AM  Result Value Ref Range   Glucose-Capillary 212 (H) 70 - 99 mg/dL    Comment: Glucose reference range applies only to samples taken after fasting for at least 8  hours.  Glucose, capillary     Status: Abnormal   Collection Time: 02/28/22  3:48 PM  Result Value Ref Range   Glucose-Capillary 225 (H) 70 - 99 mg/dL    Comment: Glucose reference range applies only to samples taken after fasting for at least 8 hours.  Glucose,  capillary     Status: Abnormal   Collection Time: 02/28/22  7:39 PM  Result Value Ref Range   Glucose-Capillary 189 (H) 70 - 99 mg/dL    Comment: Glucose reference range applies only to samples taken after fasting for at least 8 hours.  Glucose, capillary     Status: Abnormal   Collection Time: 03/01/22  7:41 AM  Result Value Ref Range   Glucose-Capillary 264 (H) 70 - 99 mg/dL    Comment: Glucose reference range applies only to samples taken after fasting for at least 8 hours.  Glucose, capillary     Status: Abnormal   Collection Time: 03/01/22 11:33 AM  Result Value Ref Range   Glucose-Capillary 238 (H) 70 - 99 mg/dL    Comment: Glucose reference range applies only to samples taken after fasting for at least 8 hours.    Blood Alcohol level:  Lab Results  Component Value Date   ETH <10 02/26/2022   ETH <10 94/85/4627    Metabolic Disorder Labs: Lab Results  Component Value Date   HGBA1C 6.3 (H) 02/20/2017   MPG 134.11 02/20/2017   MPG 177 07/31/2016   Lab Results  Component Value Date   PROLACTIN 23.3 02/20/2017   PROLACTIN 3.6 (L) 07/31/2016   Lab Results  Component Value Date   CHOL 220 (H) 02/20/2017   TRIG 132 02/20/2017   HDL 63 02/20/2017   CHOLHDL 3.5 02/20/2017   VLDL 26 02/20/2017   LDLCALC 131 (H) 02/20/2017   LDLCALC 101 (H) 07/31/2016    Physical Findings: AIMS:  , ,  ,  ,    CIWA:    COWS:     Musculoskeletal: Strength & Muscle Tone: within normal limits Gait & Station: normal Patient leans: N/A  Psychiatric Specialty Exam:  Presentation  General Appearance:  Casual  Eye Contact: Fair  Speech: Clear and Coherent  Speech Volume: Normal  Handedness: Right   Mood and  Affect  Mood: Anxious  Affect: Inappropriate   Thought Process  Thought Processes: Disorganized (Mild)  Descriptions of Associations:Intact  Orientation:Full (Time, Place and Person)  Thought Content:Delusions  History of Schizophrenia/Schizoaffective disorder:No  Duration of Psychotic Symptoms:Greater than six months  Hallucinations:No data recorded Ideas of Reference:None  Suicidal Thoughts:No data recorded Homicidal Thoughts:No data recorded  Sensorium  Memory: Immediate Fair  Judgment: Impaired  Insight: Good   Executive Functions  Concentration: Fair  Attention Span: Fair  Recall: [employer] of Knowledge: Fair  Language: Fair   Psychomotor Activity  Psychomotor Activity:No data recorded  Assets  Assets: Resilience; Social Support; Catering manager; Housing; Desire for Improvement   Sleep  Sleep:No data recorded   Physical Exam: Physical Exam Vitals and nursing note reviewed.  Constitutional:      Appearance: Normal appearance.  HENT:     Head: Normocephalic and atraumatic.     Mouth/Throat:     Pharynx: Oropharynx is clear.  Eyes:     Pupils: Pupils are equal, round, and reactive to light.  Cardiovascular:     Rate and Rhythm: Normal rate and regular rhythm.  Pulmonary:     Effort: Pulmonary effort is normal.     Breath sounds: Normal breath sounds.  Abdominal:     General: Abdomen is flat.     Palpations: Abdomen is soft.  Musculoskeletal:        General: Normal range of motion.  Skin:    General: Skin is warm and dry.  Neurological:     General: No  focal deficit present.     Mental Status: She is alert. Mental status is at baseline.  Psychiatric:        Attention and Perception: Attention normal.        Mood and Affect: Mood normal.        Speech: Speech normal.        Behavior: Behavior is cooperative.        Thought Content: Thought content normal.    Review of Systems  Constitutional:  Negative.   HENT: Negative.    Eyes: Negative.   Respiratory: Negative.    Cardiovascular: Negative.   Gastrointestinal: Negative.   Musculoskeletal: Negative.   Skin: Negative.   Neurological: Negative.   Psychiatric/Behavioral: Negative.     Blood pressure (!) 122/90, pulse 84, temperature 98.7 F (37.1 C), temperature source Oral, resp. rate 19, height 5\' 2"  (1.575 m), weight 76.4 kg, SpO2 100 %. Body mass index is 30.82 kg/m.   Treatment Plan Summary: Medication management and Plan continue current medicine.  Supportive counseling.  Encourage group attendance.  No change to current treatment plan.  , MD 03/01/2022, 12:56 PM

## 2022-03-02 DIAGNOSIS — F250 Schizoaffective disorder, bipolar type: Secondary | ICD-10-CM | POA: Diagnosis not present

## 2022-03-02 LAB — GLUCOSE, CAPILLARY
Glucose-Capillary: 224 mg/dL — ABNORMAL HIGH (ref 70–99)
Glucose-Capillary: 272 mg/dL — ABNORMAL HIGH (ref 70–99)
Glucose-Capillary: 169 mg/dL — ABNORMAL HIGH (ref 70–99)
Glucose-Capillary: 206 mg/dL — ABNORMAL HIGH (ref 70–99)

## 2022-03-02 LAB — CARBAMAZEPINE, FREE AND TOTAL
Carbamazepine, Free: 1.5 ug/mL (ref 0.6–4.2)
Carbamazepine, Total: 6.8 ug/mL (ref 4.0–12.0)

## 2022-03-02 NOTE — Progress Notes (Signed)
[facility] MD Progress Note  03/02/2022 10:17 AM Rita Sullivan  MRN:  [id number] Subjective: Rita Sullivan is seen on rounds.  She states that she slept well last night.  Nurses notes state that she has been compliant with her medications.  No side effects.  She says that she feels better.  She is still having some auditory hallucinations and is internally preoccupied at times but generally doing better.  She is alert and oriented x 3.  Principal Problem: Schizoaffective disorder, bipolar type ([place]) Diagnosis: Principal Problem:   Schizoaffective disorder, bipolar type ([place])  Total Time spent with patient: 15 minutes  Past Psychiatric History: She has numerous past psychiatric admissions for psychosis and mania.  She has been on Geodon.  She tends to travel to distant destinations for no reason.  She has [facility] up at [facility] and a past history of psychiatry in [place].  Currently attends [employer].  Past Medical History:  Past Medical History:  Diagnosis Date   Bipolar disorder ([place])    Chronic kidney disease    Diabetes mellitus without complication ([place])    Hypercholesteremia    Manic behavior ([place])     Past Surgical History:  Procedure Laterality Date   COLONOSCOPY WITH PROPOFOL N/A 03/11/2015   Procedure: COLONOSCOPY WITH PROPOFOL;  Surgeon: Hulen Luster, MD;  Location: [facility] ENDOSCOPY;  Service: Gastroenterology;  Laterality: N/A;   Family History:  Family History  Problem Relation Age of Onset   Diabetes Father    Diabetes Sister     Social History:  Social History   Substance and Sexual Activity  Alcohol Use Yes   Alcohol/week: 2.0 standard drinks of alcohol   Types: 2 Glasses of wine per week   Comment: Occ     Social History   Substance and Sexual Activity  Drug Use No    Social History   Socioeconomic History   Marital status: Married    Spouse name: Not on file   Number of children: Not on file   Years of education: Not on file   Highest education level: Not  on file  Occupational History   Not on file  Tobacco Use   Smoking status: Never   Smokeless tobacco: Never  Substance and Sexual Activity   Alcohol use: Yes    Alcohol/week: 2.0 standard drinks of alcohol    Types: 2 Glasses of wine per week    Comment: Occ   Drug use: No   Sexual activity: Never  Other Topics Concern   Not on file  Social History Narrative   Not on file   Social Determinants of Health   Financial Resource Strain: Not on file  Food Insecurity: No Food Insecurity (02/26/2022)   Hunger Vital Sign    Worried About Running Out of Food in the Last Year: Never true    Ran Out of Food in the Last Year: Never true  Transportation Needs: No Transportation Needs (02/26/2022)   PRAPARE - Hydrologist (Medical): No    Lack of Transportation (Non-Medical): No  Physical Activity: Not on file  Stress: Not on file  Social Connections: Not on file   Additional Social History:    Pain Medications: See PTA Prescriptions: See PTA Over the Counter: See PTA History of alcohol / drug use?: No history of alcohol / drug abuse Longest period of sobriety (when/how long): Unable to quantify  Sleep: Good  Appetite:  Good  Current Medications: Current Facility-Administered Medications  Medication Dose Route Frequency Provider Last Rate Last Admin   acetaminophen (TYLENOL) tablet 650 mg  650 mg Oral Q6H PRN Sherlon Handing, NP       alum & mag hydroxide-simeth (MAALOX/MYLANTA) 200-200-20 MG/5ML suspension 30 mL  30 mL Oral Q4H PRN Waldon Merl F, NP       ARIPiprazole (ABILIFY) tablet 10 mg  10 mg Oral q morning Parks Ranger, DO   10 mg at 03/02/22 0837   atorvastatin (LIPITOR) tablet 40 mg  40 mg Oral Daily Waldon Merl F, NP   40 mg at 03/02/22 0836   carbamazepine (TEGRETOL) tablet 400 mg  400 mg Oral BID PC Parks Ranger, DO   400 mg at 03/02/22 [id number]   cholecalciferol (VITAMIN D3) 25 MCG  (1000 UNIT) tablet 2,000 Units  2,000 Units Oral Daily Waldon Merl F, NP   2,000 Units at [id number]   folic acid (FOLVITE) tablet 0.5 mg  500 mcg Oral Daily Waldon Merl F, NP   0.5 mg at 03/02/22 0837   glipiZIDE (GLUCOTROL) tablet 5 mg  5 mg Oral Q breakfast Waldon Merl F, NP   5 mg at 03/02/22 [id number]   hydrOXYzine (ATARAX) tablet 25 mg  25 mg Oral TID PRN Sherlon Handing, NP       insulin aspart (novoLOG) injection 0-15 Units  0-15 Units Subcutaneous TID WC Waldon Merl F, NP   5 Units at 03/02/22 [id number]   insulin aspart (novoLOG) injection 0-5 Units  0-5 Units Subcutaneous QHS Waldon Merl F, NP   2 Units at 03/01/22 2128   losartan (COZAAR) tablet 25 mg  25 mg Oral Daily Waldon Merl F, NP   25 mg at 03/02/22 [id number]   magnesium hydroxide (MILK OF MAGNESIA) suspension 30 mL  30 mL Oral Daily PRN Waldon Merl F, NP       metFORMIN (GLUCOPHAGE) tablet 1,000 mg  1,000 mg Oral BID WC Waldon Merl F, NP   1,000 mg at 03/02/22 [id number]   traZODone (DESYREL) tablet 100 mg  100 mg Oral QHS Waldon Merl F, NP   100 mg at 03/01/22 2128    Lab Results:  Results for orders placed or performed during the hospital encounter of 02/26/22 (from the past 48 hour(s))  Glucose, capillary     Status: Abnormal   Collection Time: 02/28/22 11:46 AM  Result Value Ref Range   Glucose-Capillary 212 (H) 70 - 99 mg/dL    Comment: Glucose reference range applies only to samples taken after fasting for at least 8 hours.  Glucose, capillary     Status: Abnormal   Collection Time: 02/28/22  3:48 PM  Result Value Ref Range   Glucose-Capillary 225 (H) 70 - 99 mg/dL    Comment: Glucose reference range applies only to samples taken after fasting for at least 8 hours.  Glucose, capillary     Status: Abnormal   Collection Time: 02/28/22  7:39 PM  Result Value Ref Range   Glucose-Capillary 189 (H) 70 - 99 mg/dL    Comment: Glucose reference range applies only to samples taken after fasting  for at least 8 hours.  Glucose, capillary     Status: Abnormal   Collection Time: 03/01/22  7:41 AM  Result Value Ref Range   Glucose-Capillary 264 (H) 70 - 99 mg/dL    Comment: Glucose reference range applies only to samples taken after fasting for  at least 8 hours.  Glucose, capillary     Status: Abnormal   Collection Time: 03/01/22 11:33 AM  Result Value Ref Range   Glucose-Capillary 238 (H) 70 - 99 mg/dL    Comment: Glucose reference range applies only to samples taken after fasting for at least 8 hours.  Glucose, capillary     Status: Abnormal   Collection Time: 03/01/22  4:18 PM  Result Value Ref Range   Glucose-Capillary 250 (H) 70 - 99 mg/dL    Comment: Glucose reference range applies only to samples taken after fasting for at least 8 hours.  Glucose, capillary     Status: Abnormal   Collection Time: 03/01/22  8:06 PM  Result Value Ref Range   Glucose-Capillary 238 (H) 70 - 99 mg/dL    Comment: Glucose reference range applies only to samples taken after fasting for at least 8 hours.  Glucose, capillary     Status: Abnormal   Collection Time: 03/02/22  7:53 AM  Result Value Ref Range   Glucose-Capillary 224 (H) 70 - 99 mg/dL    Comment: Glucose reference range applies only to samples taken after fasting for at least 8 hours.    Blood Alcohol level:  Lab Results  Component Value Date   ETH <10 02/26/2022   ETH <10 33/29/5188    Metabolic Disorder Labs: Lab Results  Component Value Date   HGBA1C 6.3 (H) 02/20/2017   MPG 134.11 02/20/2017   MPG 177 07/31/2016   Lab Results  Component Value Date   PROLACTIN 23.3 02/20/2017   PROLACTIN 3.6 (L) 07/31/2016   Lab Results  Component Value Date   CHOL 220 (H) 02/20/2017   TRIG 132 02/20/2017   HDL 63 02/20/2017   CHOLHDL 3.5 02/20/2017   VLDL 26 02/20/2017   LDLCALC 131 (H) 02/20/2017   LDLCALC 101 (H) 07/31/2016    Physical Findings: AIMS:  , ,  ,  ,    CIWA:    COWS:     Musculoskeletal: Strength &  Muscle Tone: within normal limits Gait & Station: normal Patient leans: N/A  Psychiatric Specialty Exam:  Presentation  General Appearance:  Casual  Eye Contact: Fair  Speech: Clear and Coherent  Speech Volume: Normal  Handedness: Right   Mood and Affect  Mood: Anxious  Affect: Inappropriate   Thought Process  Thought Processes: Disorganized (Mild)  Descriptions of Associations:Intact  Orientation:Full (Time, Place and Person)  Thought Content:Delusions  History of Schizophrenia/Schizoaffective disorder:No  Duration of Psychotic Symptoms:Greater than six months  Hallucinations:No data recorded Ideas of Reference:None  Suicidal Thoughts:No data recorded Homicidal Thoughts:No data recorded  Sensorium  Memory: Immediate Fair  Judgment: Impaired  Insight: Good   Executive Functions  Concentration: Fair  Attention Span: Fair  Recall: [employer] of Knowledge: Fair  Language: Fair   Psychomotor Activity  Psychomotor Activity:No data recorded  Assets  Assets: Resilience; Social Support; Catering manager; Housing; Desire for Improvement   Sleep  Sleep:No data recorded   Physical Exam: Physical Exam Vitals and nursing note reviewed.  Constitutional:      Appearance: Normal appearance. She is normal weight.  Neurological:     General: No focal deficit present.     Mental Status: She is alert and oriented to person, place, and time.  Psychiatric:        Attention and Perception: Attention normal. She perceives auditory hallucinations.        Mood and Affect: Mood is depressed.  Speech: Speech normal.        Behavior: Behavior normal. Behavior is cooperative.        Thought Content: Thought content is paranoid.        Cognition and Memory: Cognition and memory normal.        Judgment: Judgment is impulsive and inappropriate.    Review of Systems  Constitutional: Negative.   HENT: Negative.    Eyes:  Negative.   Respiratory: Negative.    Cardiovascular: Negative.   Gastrointestinal: Negative.   Genitourinary: Negative.   Musculoskeletal: Negative.   Skin: Negative.   Neurological: Negative.   Endo/Heme/Allergies: Negative.   Psychiatric/Behavioral:  The patient is nervous/anxious.    Blood pressure 115/79, pulse 97, temperature 97.7 F (36.5 C), temperature source Oral, resp. rate 16, height 5\' 2"  (1.575 m), weight 76.4 kg, SpO2 99 %. Body mass index is 30.82 kg/m.   Treatment Plan Summary: Daily contact with patient to assess and evaluate symptoms and progress in treatment, Medication management, and Plan continue current medications.  Parks Ranger, DO 03/02/2022, 10:17 AM

## 2022-03-02 NOTE — Inpatient Diabetes Management (Signed)
Inpatient Diabetes Program Recommendations  AACE/ADA: New Consensus Statement on Inpatient Glycemic Control (2015)  Target Ranges:  Prepandial:   less than 140 mg/dL      Peak postprandial:   less than 180 mg/dL (1-2 hours)      Critically ill patients:  140 - 180 mg/dL   Lab Results  Component Value Date   GLUCAP 272 (H) 03/02/2022   HGBA1C 6.3 (H) 02/20/2017    Review of Glycemic Control  Latest Reference Range & Units 03/01/22 07:41 03/01/22 11:33 03/01/22 16:18 03/01/22 20:06 03/02/22 07:53 03/02/22 11:32  Glucose-Capillary 70 - 99 mg/dL 264 (H) 238 (H) 250 (H) 238 (H) 224 (H) 272 (H)  (H): Data is abnormally high  Diabetes history: DM2 Outpatient Diabetes medications:  Metformin 1000 mg BID Jardiance 10 mg QD Glipizide 5 mg QD Current orders for Inpatient glycemic control:  Novolog 0-15 units tID and 0-5 units QHS Glipizide 5 mg QD Metformin 1000 mg BID  Last A1C was 8.5% in Care Everywhere on 02/16/22  Inpatient Diabetes Program Recommendations:    Semglee 10 units QD  Will continue to follow while inpatient.  Thank you, Reche Dixon, MSN, [place] [facility] Coordinator Inpatient Diabetes Program [phone] (team pager from 8a-5p)

## 2022-03-02 NOTE — Progress Notes (Signed)
Patient alert and oriented x 4.  Patient with pleasant affect.  Denies SI/HI, VH, anxiety and depression.  Patient reports her AH and racing thoughts are "much better."   Denies pain.   15 min checks in place. Patient is safe on the unit.  Compliant with medications. No adverse effects noted.  Appropriate interaction with peers when in milieu.   Patient isolated to room in the morning.  Patient was present in the milieu this afternoon and joined others in the courtyard.

## 2022-03-02 NOTE — Group Note (Signed)
[facility] LCSW Group Therapy Note    Group Date: 03/02/2022 Start Time: 7588 End Time: 1415  Type of Therapy and Topic:  Group Therapy:  Overcoming Obstacles  Participation Level:  [facility] PARTICIPATION LEVEL: None  Mood:  Description of Group:   In this group patients will be encouraged to explore what they see as obstacles to their own wellness and recovery. They will be guided to discuss their thoughts, feelings, and behaviors related to these obstacles. The group will process together ways to cope with barriers, with attention given to specific choices patients can make. Each patient will be challenged to identify changes they are motivated to make in order to overcome their obstacles. This group will be process-oriented, with patients participating in exploration of their own experiences as well as giving and receiving support and challenge from other group members.  Therapeutic Goals: 1. Patient will identify personal and current obstacles as they relate to admission. 2. Patient will identify barriers that currently interfere with their wellness or overcoming obstacles.  3. Patient will identify feelings, thought process and behaviors related to these barriers. 4. Patient will identify two changes they are willing to make to overcome these obstacles:    Summary of Patient Progress Patient was present in group and did not engage in group discussions.    Therapeutic Modalities:   Cognitive Behavioral Therapy Solution Focused Therapy Motivational Interviewing Relapse Prevention Therapy   [name], LCSW

## 2022-03-02 NOTE — Progress Notes (Signed)
Patient presents with flat affect. Delayed responses when questions are asked. Logical and coherent responses. Denies any SI, Hi, AVH. States had a good day. Visible in milieu, minimal interaction with peers. Sits watches tv. Encouragement and support provided. Safety checks maintained. Medications given as prescribed. Pt receptive and remains safe on unit with q 15 min checks.

## 2022-03-02 NOTE — Plan of Care (Signed)
  Problem: Coping: Goal: Level of anxiety will decrease Outcome: Progressing   Problem: Education: Goal: Emotional status will improve Outcome: Progressing   Problem: Activity: Goal: Interest or engagement in activities will improve Outcome: Progressing   Problem: Coping: Goal: Ability to demonstrate self-control will improve Outcome: Progressing   Problem: Health Behavior/Discharge Planning: Goal: Compliance with treatment plan for underlying cause of condition will improve Outcome: Progressing

## 2022-03-03 DIAGNOSIS — F250 Schizoaffective disorder, bipolar type: Secondary | ICD-10-CM | POA: Diagnosis not present

## 2022-03-03 LAB — GLUCOSE, CAPILLARY
Glucose-Capillary: 228 mg/dL — ABNORMAL HIGH (ref 70–99)
Glucose-Capillary: 234 mg/dL — ABNORMAL HIGH (ref 70–99)
Glucose-Capillary: 324 mg/dL — ABNORMAL HIGH (ref 70–99)
Glucose-Capillary: 254 mg/dL — ABNORMAL HIGH (ref 70–99)

## 2022-03-03 MED ORDER — INSULIN GLARGINE-YFGN 100 UNIT/ML ~~LOC~~ SOLN
10.0000 [IU] | Freq: Every day | SUBCUTANEOUS | Status: DC
  Administered 2022-03-03 – 2022-03-12 (×10): 10 [IU] via SUBCUTANEOUS
  Filled 2022-03-03 (×12): qty 0.1

## 2022-03-03 MED ORDER — INSULIN GLARGINE-YFGN 100 UNIT/ML ~~LOC~~ SOLN
10.0000 [IU] | Freq: Every day | SUBCUTANEOUS | Status: DC
  Filled 2022-03-03: qty 0.1

## 2022-03-03 NOTE — Plan of Care (Signed)
  Problem: Education: Goal: Knowledge of General Education information will improve Description: Including pain rating scale, medication(s)/side effects and non-pharmacologic comfort measures Outcome: Progressing   Problem: Health Behavior/Discharge Planning: Goal: Ability to manage health-related needs will improve Outcome: Progressing   Problem: Clinical Measurements: Goal: Ability to maintain clinical measurements within normal limits will improve Outcome: Progressing Goal: Will remain free from infection Outcome: Progressing Goal: Diagnostic test results will improve Outcome: Progressing Goal: Respiratory complications will improve Outcome: Progressing Goal: Cardiovascular complication will be avoided Outcome: Progressing   Problem: Activity: Goal: Risk for activity intolerance will decrease Outcome: Progressing   Problem: Nutrition: Goal: Adequate nutrition will be maintained Outcome: Progressing   Problem: Coping: Goal: Level of anxiety will decrease Outcome: Progressing   Problem: Elimination: Goal: Will not experience complications related to bowel motility Outcome: Progressing   Problem: Pain Managment: Goal: General experience of comfort will improve Outcome: Progressing   Problem: Safety: Goal: Ability to remain free from injury will improve Outcome: Progressing   Problem: Skin Integrity: Goal: Risk for impaired skin integrity will decrease Outcome: Progressing   Problem: Education: Goal: Knowledge of [place] [facility] Education information/materials will improve Outcome: Progressing Goal: Emotional status will improve Outcome: Progressing Goal: Mental status will improve Outcome: Progressing Goal: Verbalization of understanding the information provided will improve Outcome: Progressing   Problem: Activity: Goal: Interest or engagement in activities will improve Outcome: Progressing Goal: Sleeping patterns will improve Outcome: Progressing    Problem: Coping: Goal: Ability to verbalize frustrations and anger appropriately will improve Outcome: Progressing Goal: Ability to demonstrate self-control will improve Outcome: Progressing   Problem: Health Behavior/Discharge Planning: Goal: Identification of resources available to assist in meeting health care needs will improve Outcome: Progressing Goal: Compliance with treatment plan for underlying cause of condition will improve Outcome: Progressing   Problem: Physical Regulation: Goal: Ability to maintain clinical measurements within normal limits will improve Outcome: Progressing   Problem: Safety: Goal: Periods of time without injury will increase Outcome: Progressing

## 2022-03-03 NOTE — Inpatient Diabetes Management (Signed)
Inpatient Diabetes Program Recommendations  AACE/ADA: New Consensus Statement on Inpatient Glycemic Control (2015)  Target Ranges:  Prepandial:   less than 140 mg/dL      Peak postprandial:   less than 180 mg/dL (1-2 hours)      Critically ill patients:  140 - 180 mg/dL   Lab Results  Component Value Date   GLUCAP 228 (H) 03/03/2022   HGBA1C 6.3 (H) 02/20/2017    Review of Glycemic Control  Latest Reference Range & Units 03/02/22 07:53 03/02/22 11:32 03/02/22 16:20 03/02/22 20:20 03/03/22 07:50  Glucose-Capillary 70 - 99 mg/dL 224 (H) 272 (H) 169 (H) 206 (H) 228 (H)  (H): Data is abnormally high  Diabetes history: DM2 Outpatient Diabetes medications:  Metformin 1000 mg BID Jardiance 10 mg QD Glipizide 5 mg QD Current orders for Inpatient glycemic control:  Novolog 0-15 units tID and 0-5 units QHS Glipizide 5 mg QD Metformin 1000 mg BID   Inpatient Diabetes Program Recommendations:     Semglee 10 units QD   Will continue to follow while inpatient.   Thank you, Reche Dixon, MSN, [place] Diabetes Coordinator Inpatient Diabetes Program [phone] (team pager from 8a-5p)

## 2022-03-03 NOTE — BH Assessment (Signed)
1910 Patient sitting in the day room watching TV. No interaction between her and fellow patients noted at this time.  2035 Patient HS FS is 206, this BS requires treatment of sliding scale insulin at 2200. She is alert and oriented x 3, denies SI/HI, A/V hallucinations, but reports she is a little depressed. Will continue to monitor patient for safety.  2300 Patient remains medication compliant.   0300 Patient continues to rest in bed quietly.  0630 Patient up several times for a cup of water. She has remained safe and cooperative. Will continue to monitor patient for safety.

## 2022-03-03 NOTE — Group Note (Signed)
LCSW Group Therapy Note  Group Date: 03/03/2022 Start Time: 1300 End Time: 1400   Type of Therapy and Topic:  Group Therapy - How To Cope with Nervousness about Discharge   Participation Level:  Minimal   Description of Group This process group involved identification of patients' feelings about discharge. Some of them are scheduled to be discharged soon, while others are new admissions, but each of them was asked to share thoughts and feelings surrounding discharge from the hospital. One common theme was that they are excited at the prospect of going home, while another was that many of them are apprehensive about sharing why they were hospitalized. Patients were given the opportunity to discuss these feelings with their peers in preparation for discharge.  Therapeutic Goals  Patient will identify their overall feelings about pending discharge. Patient will think about how they might proactively address issues that they believe will once again arise once they get home (i.e. with parents). Patients will participate in discussion about having hope for change.   Summary of Patient Progress:   Patient was present for the entirety of group session. Patient participated in opening and closing remarks. However, patient did not contribute at all to the topic of discussion despite encouraged participation.    Therapeutic Modalities Cognitive Behavioral Therapy   Larose Kells 03/03/2022  3:48 PM

## 2022-03-03 NOTE — Progress Notes (Signed)
Patient is alert and oriented times 3. Mood and affect flat and childlike. Patient denies pain. She denies SI, HI, and AVH. Also denies feelings of anxiety and depression at this time. States she slept good last night. Morning meds given whole by mouth W/O difficulty. Ate breakfast in day room- appetite good. Blood sugar 258- 5U Novolog given as ordered. Patient remains safe on unit with Q15 minute checks in place.

## 2022-03-03 NOTE — Progress Notes (Signed)
[facility] MD Progress Note  03/03/2022 12:54 PM Rita Sullivan  MRN:  [id number] Subjective: Rita Sullivan states that she is doing well.  She is kind of delayed in her responses.  She denies any auditory hallucinations.  She has been compliant with her medications and denies any side effects.  When I asked her about her racing thoughts she said not so much.  Nurses note that she seems to be sleeping pretty well.  Principal Problem: Schizoaffective disorder, bipolar type (HCC) Diagnosis: Principal Problem:   Schizoaffective disorder, bipolar type (HCC)  Total Time spent with patient: 15 minutes  Past Psychiatric History: She has numerous past psychiatric admissions for psychosis and mania.  She has been on Geodon.  She tends to travel to distant destinations for no reason.  She has followed up at [facility] and a past history of psychiatry in [place].  Currently attends [employer].  Past Medical History:  Past Medical History:  Diagnosis Date   Bipolar disorder (HCC)    Chronic kidney disease    Diabetes mellitus without complication (HCC)    Hypercholesteremia    Manic behavior (HCC)     Past Surgical History:  Procedure Laterality Date   COLONOSCOPY WITH PROPOFOL N/A 03/11/2015   Procedure: COLONOSCOPY WITH PROPOFOL;  Surgeon: Wallace Cullens, MD;  Location: [facility] ENDOSCOPY;  Service: Gastroenterology;  Laterality: N/A;   Family History:  Family History  Problem Relation Age of Onset   Diabetes Father    Diabetes Sister    Social History:  Social History   Substance and Sexual Activity  Alcohol Use Yes   Alcohol/week: 2.0 standard drinks of alcohol   Types: 2 Glasses of wine per week   Comment: Occ     Social History   Substance and Sexual Activity  Drug Use No    Social History   Socioeconomic History   Marital status: Married    Spouse name: Not on file   Number of children: Not on file   Years of education: Not on file   Highest education level: Not on file  Occupational  History   Not on file  Tobacco Use   Smoking status: Never   Smokeless tobacco: Never  Substance and Sexual Activity   Alcohol use: Yes    Alcohol/week: 2.0 standard drinks of alcohol    Types: 2 Glasses of wine per week    Comment: Occ   Drug use: No   Sexual activity: Never  Other Topics Concern   Not on file  Social History Narrative   Not on file   Social Determinants of Health   Financial Resource Strain: Not on file  Food Insecurity: No Food Insecurity (02/26/2022)   Hunger Vital Sign    Worried About Running Out of Food in the Last Year: Never true    Ran Out of Food in the Last Year: Never true  Transportation Needs: No Transportation Needs (02/26/2022)   PRAPARE - Administrator, Civil Service (Medical): No    Lack of Transportation (Non-Medical): No  Physical Activity: Not on file  Stress: Not on file  Social Connections: Not on file   Additional Social History:    Pain Medications: See PTA Prescriptions: See PTA Over the Counter: See PTA History of alcohol / drug use?: No history of alcohol / drug abuse Longest period of sobriety (when/how long): Unable to quantify  Sleep: Good  Appetite:  Good  Current Medications: Current Facility-Administered Medications  Medication Dose Route Frequency Provider Last Rate Last Admin   acetaminophen (TYLENOL) tablet 650 mg  650 mg Oral Q6H PRN Vanetta Mulders, NP       alum & mag hydroxide-simeth (MAALOX/MYLANTA) 200-200-20 MG/5ML suspension 30 mL  30 mL Oral Q4H PRN Gabriel Cirri F, NP       ARIPiprazole (ABILIFY) tablet 10 mg  10 mg Oral q morning Sarina Ill, DO   10 mg at 03/03/22 0805   atorvastatin (LIPITOR) tablet 40 mg  40 mg Oral Daily Gabriel Cirri F, NP   40 mg at 03/03/22 1017   carbamazepine (TEGRETOL) tablet 400 mg  400 mg Oral BID PC Sarina Ill, DO   400 mg at 03/03/22 5102   cholecalciferol (VITAMIN D3) 25 MCG (1000 UNIT) tablet 2,000  Units  2,000 Units Oral Daily Gabriel Cirri F, NP   2,000 Units at 03/03/22 5852   folic acid (FOLVITE) tablet 0.5 mg  500 mcg Oral Daily Gabriel Cirri F, NP   0.5 mg at 03/03/22 0809   glipiZIDE (GLUCOTROL) tablet 5 mg  5 mg Oral Q breakfast Gabriel Cirri F, NP   5 mg at 03/03/22 7782   hydrOXYzine (ATARAX) tablet 25 mg  25 mg Oral TID PRN Vanetta Mulders, NP       insulin aspart (novoLOG) injection 0-15 Units  0-15 Units Subcutaneous TID WC Gabriel Cirri F, NP   5 Units at 03/03/22 1151   insulin aspart (novoLOG) injection 0-5 Units  0-5 Units Subcutaneous QHS Gabriel Cirri F, NP   2 Units at 03/02/22 2118   losartan (COZAAR) tablet 25 mg  25 mg Oral Daily Gabriel Cirri F, NP   25 mg at 03/03/22 0805   magnesium hydroxide (MILK OF MAGNESIA) suspension 30 mL  30 mL Oral Daily PRN Gabriel Cirri F, NP       metFORMIN (GLUCOPHAGE) tablet 1,000 mg  1,000 mg Oral BID WC Gabriel Cirri F, NP   1,000 mg at 03/03/22 4235   traZODone (DESYREL) tablet 100 mg  100 mg Oral QHS Gabriel Cirri F, NP   100 mg at 03/02/22 2112    Lab Results:  Results for orders placed or performed during the hospital encounter of 02/26/22 (from the past 48 hour(s))  Glucose, capillary     Status: Abnormal   Collection Time: 03/01/22  4:18 PM  Result Value Ref Range   Glucose-Capillary 250 (H) 70 - 99 mg/dL    Comment: Glucose reference range applies only to samples taken after fasting for at least 8 hours.  Glucose, capillary     Status: Abnormal   Collection Time: 03/01/22  8:06 PM  Result Value Ref Range   Glucose-Capillary 238 (H) 70 - 99 mg/dL    Comment: Glucose reference range applies only to samples taken after fasting for at least 8 hours.  Glucose, capillary     Status: Abnormal   Collection Time: 03/02/22  7:53 AM  Result Value Ref Range   Glucose-Capillary 224 (H) 70 - 99 mg/dL    Comment: Glucose reference range applies only to samples taken after fasting for at least 8 hours.   Glucose, capillary     Status: Abnormal   Collection Time: 03/02/22 11:32 AM  Result Value Ref Range   Glucose-Capillary 272 (H) 70 - 99 mg/dL    Comment: Glucose reference range applies only to samples taken after fasting for  at least 8 hours.  Glucose, capillary     Status: Abnormal   Collection Time: 03/02/22  4:20 PM  Result Value Ref Range   Glucose-Capillary 169 (H) 70 - 99 mg/dL    Comment: Glucose reference range applies only to samples taken after fasting for at least 8 hours.  Glucose, capillary     Status: Abnormal   Collection Time: 03/02/22  8:20 PM  Result Value Ref Range   Glucose-Capillary 206 (H) 70 - 99 mg/dL    Comment: Glucose reference range applies only to samples taken after fasting for at least 8 hours.   Comment 1 Notify RN   Glucose, capillary     Status: Abnormal   Collection Time: 03/03/22  7:50 AM  Result Value Ref Range   Glucose-Capillary 228 (H) 70 - 99 mg/dL    Comment: Glucose reference range applies only to samples taken after fasting for at least 8 hours.  Glucose, capillary     Status: Abnormal   Collection Time: 03/03/22 11:45 AM  Result Value Ref Range   Glucose-Capillary 234 (H) 70 - 99 mg/dL    Comment: Glucose reference range applies only to samples taken after fasting for at least 8 hours.    Blood Alcohol level:  Lab Results  Component Value Date   ETH <10 02/26/2022   ETH <10 33/29/5188    Metabolic Disorder Labs: Lab Results  Component Value Date   HGBA1C 6.3 (H) 02/20/2017   MPG 134.11 02/20/2017   MPG 177 07/31/2016   Lab Results  Component Value Date   PROLACTIN 23.3 02/20/2017   PROLACTIN 3.6 (L) 07/31/2016   Lab Results  Component Value Date   CHOL 220 (H) 02/20/2017   TRIG 132 02/20/2017   HDL 63 02/20/2017   CHOLHDL 3.5 02/20/2017   VLDL 26 02/20/2017   LDLCALC 131 (H) 02/20/2017   LDLCALC 101 (H) 07/31/2016    Physical Findings: AIMS:  , ,  ,  ,    CIWA:    COWS:     Musculoskeletal: Strength &  Muscle Tone: within normal limits Gait & Station: normal Patient leans: N/A  Psychiatric Specialty Exam:  Presentation  General Appearance:  Casual  Eye Contact: Fair  Speech: Clear and Coherent  Speech Volume: Normal  Handedness: Right   Mood and Affect  Mood: Anxious  Affect: Inappropriate   Thought Process  Thought Processes: Disorganized (Mild)  Descriptions of Associations:Intact  Orientation:Full (Time, Place and Person)  Thought Content:Delusions  History of Schizophrenia/Schizoaffective disorder:No  Duration of Psychotic Symptoms:Greater than six months  Hallucinations:No data recorded Ideas of Reference:None  Suicidal Thoughts:No data recorded Homicidal Thoughts:No data recorded  Sensorium  Memory: Immediate Fair  Judgment: Impaired  Insight: Good   Executive Functions  Concentration: Fair  Attention Span: Fair  Recall: [employer] of Knowledge: Fair  Language: Fair   Psychomotor Activity  Psychomotor Activity:No data recorded  Assets  Assets: Resilience; Social Support; Catering manager; Housing; Desire for Improvement   Sleep  Sleep:No data recorded   Physical Exam: Physical Exam Vitals and nursing note reviewed.  Constitutional:      Appearance: Normal appearance. She is normal weight.  Neurological:     General: No focal deficit present.     Mental Status: She is alert and oriented to person, place, and time.  Psychiatric:        Attention and Perception: Attention and perception normal.        Mood and Affect: Mood normal. Affect  is flat.        Speech: Speech is delayed.        Behavior: Behavior normal. Behavior is cooperative.        Thought Content: Thought content normal.        Cognition and Memory: Cognition and memory normal.        Judgment: Judgment normal.    Review of Systems  Constitutional: Negative.   HENT: Negative.    Eyes: Negative.   Respiratory: Negative.     Cardiovascular: Negative.   Gastrointestinal: Negative.   Genitourinary: Negative.   Musculoskeletal: Negative.   Skin: Negative.   Neurological: Negative.   Endo/Heme/Allergies: Negative.   Psychiatric/Behavioral: Negative.     Blood pressure 121/63, pulse 92, temperature 97.8 F (36.6 C), temperature source Oral, resp. rate 20, height 5\' 2"  (1.575 m), weight 76.4 kg, SpO2 100 %. Body mass index is 30.82 kg/m.   Treatment Plan Summary: Daily contact with patient to assess and evaluate symptoms and progress in treatment, Medication management, and Plan add Semglee diabetic nurse's recommendation.  Continue current medications.  , DO 03/03/2022, 12:54 PM

## 2022-03-04 DIAGNOSIS — F250 Schizoaffective disorder, bipolar type: Secondary | ICD-10-CM | POA: Diagnosis not present

## 2022-03-04 LAB — GLUCOSE, CAPILLARY
Glucose-Capillary: 173 mg/dL — ABNORMAL HIGH (ref 70–99)
Glucose-Capillary: 263 mg/dL — ABNORMAL HIGH (ref 70–99)
Glucose-Capillary: 320 mg/dL — ABNORMAL HIGH (ref 70–99)

## 2022-03-04 NOTE — Progress Notes (Signed)
  Patient is depressed and anxious. Lethargic.  Denies SI, HI, and pain. Vital signs are stable. Patient is compliant to medications. Patient's appetite is good and ate snacks. Patient took her night medications and went to bed. Patient was on bed with eyes closed most of the shift. No other issues. Q 15 Minutes checks are maintained throughout the shift. We will continue monitor.

## 2022-03-04 NOTE — Progress Notes (Signed)
Patient is calm and  cooperative but Lethargic. Denies SI, HI, and pain. Vital signs are stable. Patient is compliant to medications. Blood Sugar was 324 mg /dl. Pt got 4 units of novolog per sliding scale and 10 units of glargine standing order. Patient was offered education on blood sugar management. Patient's appetite is good and ate snacks. Patient took her night medications and went to bed. Patient was on bed with eyes closed most of the shift. No other issues. Q 15 Minutes checks are maintained throughout the shift. We will continue monitor.

## 2022-03-04 NOTE — Progress Notes (Signed)
Patient is A+O x 3, disoriented to situation. She denies SI/HI/AVH. She denies anxiety and depression although a depressed look is noted. Patient reports 0/10 pain.   Upon receiving insulin during lunch, patient asks, "Why do I have to get this?" After educating patient about her diabetic condition, she states, "I am not diabetic. Your meter is wrong."  Patient refused dinner accu check shouting, "leave me alone, get out!!" 2 other staff attempted to reason with patient but without success. This nurse offered to adm a PRN med for anxiety but patient refused stating, "I don't want your meds." MD informed of the accu check refusal.  Q15 minute unit checks in place.

## 2022-03-04 NOTE — Group Note (Signed)
[facility] LCSW Group Therapy Note   Group Date: 03/04/2022 Start Time: 1315 End Time: 1415   Type of Therapy/Topic:  Group Therapy:  Emotion Regulation  Participation Level:  Did Not Attend   Mood:  Description of Group:    The purpose of this group is to assist patients in learning to regulate negative emotions and experience positive emotions. Patients will be guided to discuss ways in which they have been vulnerable to their negative emotions. These vulnerabilities will be juxtaposed with experiences of positive emotions or situations, and patients challenged to use positive emotions to combat negative ones. Special emphasis will be placed on coping with negative emotions in conflict situations, and patients will process healthy conflict resolution skills.  Therapeutic Goals: Patient will identify two positive emotions or experiences to reflect on in order to balance out negative emotions:  Patient will label two or more emotions that they find the most difficult to experience:  Patient will be able to demonstrate positive conflict resolution skills through discussion or role plays:   Summary of Patient Progress:   X    Therapeutic Modalities:   Cognitive Behavioral Therapy Feelings Identification Dialectical Behavioral Therapy   Bret Vanessen J Priyanka Causey, LCSW 

## 2022-03-04 NOTE — Progress Notes (Signed)
[facility] MD Progress Note  03/04/2022 11:02 AM Rita Sullivan  MRN:  [id number] Subjective: Rita Sullivan is seen on rounds.  She remains flat and depressed but she states that she is feeling fine.  She thinks that she will be ready to go by Friday.  She has been tolerating medications.  Seems to be doing better on the higher dose of Abilify.  Blood sugar is better controlled.  Principal Problem: Schizoaffective disorder, bipolar type ([place]) Diagnosis: Principal Problem:   Schizoaffective disorder, bipolar type ([place])  Total Time spent with patient: 15 minutes  Past Psychiatric History: She has numerous past psychiatric admissions for psychosis and mania.  She has been on Geodon.  She tends to travel to distant destinations for no reason.  She has followed up at [facility] and a past history of psychiatry in [place].  Currently att[facility] [employer].  Past Medical History:  Past Medical History:  Diagnosis Date   Bipolar disorder ([place])    Chronic kidney disease    Diabetes mellitus without complication ([place])    Hypercholesteremia    Manic behavior ([place])     Past Surgical History:  Procedure Laterality Date   COLONOSCOPY WITH PROPOFOL N/A 03/11/2015   Procedure: COLONOSCOPY WITH PROPOFOL;  Surgeon: Hulen Luster, MD;  Location: [facility] ENDOSCOPY;  Service: Gastroenterology;  Laterality: N/A;   Family History:  Family History  Problem Relation Age of Onset   Diabetes Father    Diabetes Sister     Social History:  Social History   Substance and Sexual Activity  Alcohol Use Yes   Alcohol/week: 2.0 standard drinks of alcohol   Types: 2 Glasses of wine per week   Comment: Occ     Social History   Substance and Sexual Activity  Drug Use No    Social History   Socioeconomic History   Marital status: Married    Spouse name: Not on file   Number of children: Not on file   Years of education: Not on file   Highest education level: Not on file  Occupational History   Not on file  Tobacco  Use   Smoking status: Never   Smokeless tobacco: Never  Substance and Sexual Activity   Alcohol use: Yes    Alcohol/week: 2.0 standard drinks of alcohol    Types: 2 Glasses of wine per week    Comment: Occ   Drug use: No   Sexual activity: Never  Other Topics Concern   Not on file  Social History Narrative   Not on file   Social Determinants of Health   Financial Resource Strain: Not on file  Food Insecurity: No Food Insecurity (02/26/2022)   Hunger Vital Sign    Worried About Running Out of Food in the Last Year: Never true    Ran Out of Food in the Last Year: Never true  Transportation Needs: No Transportation Needs (02/26/2022)   PRAPARE - Hydrologist (Medical): No    Lack of Transportation (Non-Medical): No  Physical Activity: Not on file  Stress: Not on file  Social Connections: Not on file   Additional Social History:    Pain Medications: See PTA Prescriptions: See PTA Over the Counter: See PTA History of alcohol / drug use?: No history of alcohol / drug abuse Longest period of sobriety (when/how long): Unable to quantify                    Sleep: Fair  Appetite:  Good  Current Medications: Current Facility-Administered Medications  Medication Dose Route Frequency Provider Last Rate Last Admin   acetaminophen (TYLENOL) tablet 650 mg  650 mg Oral Q6H PRN Vanetta Mulders, NP       alum & mag hydroxide-simeth (MAALOX/MYLANTA) 200-200-20 MG/5ML suspension 30 mL  30 mL Oral Q4H PRN Gabriel Cirri F, NP       ARIPiprazole (ABILIFY) tablet 10 mg  10 mg Oral q morning Sarina Ill, DO   10 mg at 03/04/22 0905   atorvastatin (LIPITOR) tablet 40 mg  40 mg Oral Daily Gabriel Cirri F, NP   40 mg at 03/04/22 6045   carbamazepine (TEGRETOL) tablet 400 mg  400 mg Oral BID PC Sarina Ill, DO   400 mg at 03/04/22 4098   cholecalciferol (VITAMIN D3) 25 MCG (1000 UNIT) tablet 2,000 Units  2,000 Units Oral Daily  Gabriel Cirri F, NP   2,000 Units at 03/04/22 1191   folic acid (FOLVITE) tablet 0.5 mg  500 mcg Oral Daily Gabriel Cirri F, NP   0.5 mg at 03/04/22 0905   glipiZIDE (GLUCOTROL) tablet 5 mg  5 mg Oral Q breakfast Gabriel Cirri F, NP   5 mg at 03/04/22 0756   hydrOXYzine (ATARAX) tablet 25 mg  25 mg Oral TID PRN Vanetta Mulders, NP       insulin aspart (novoLOG) injection 0-15 Units  0-15 Units Subcutaneous TID WC Vanetta Mulders, NP   3 Units at 03/04/22 0754   insulin aspart (novoLOG) injection 0-5 Units  0-5 Units Subcutaneous QHS Gabriel Cirri F, NP   3 Units at 03/03/22 2148   insulin glargine-yfgn (SEMGLEE) injection 10 Units  10 Units Subcutaneous QHS Sarina Ill, DO   10 Units at 03/03/22 2149   losartan (COZAAR) tablet 25 mg  25 mg Oral Daily Gabriel Cirri F, NP   25 mg at 03/04/22 4782   magnesium hydroxide (MILK OF MAGNESIA) suspension 30 mL  30 mL Oral Daily PRN Gabriel Cirri F, NP       metFORMIN (GLUCOPHAGE) tablet 1,000 mg  1,000 mg Oral BID WC Gabriel Cirri F, NP   1,000 mg at 03/04/22 0756   traZODone (DESYREL) tablet 100 mg  100 mg Oral QHS Gabriel Cirri F, NP   100 mg at 03/03/22 2147    Lab Results:  Results for orders placed or performed during the hospital encounter of 02/26/22 (from the past 48 hour(s))  Glucose, capillary     Status: Abnormal   Collection Time: 03/02/22 11:32 AM  Result Value Ref Range   Glucose-Capillary 272 (H) 70 - 99 mg/dL    Comment: Glucose reference range applies only to samples taken after fasting for at least 8 hours.  Glucose, capillary     Status: Abnormal   Collection Time: 03/02/22  4:20 PM  Result Value Ref Range   Glucose-Capillary 169 (H) 70 - 99 mg/dL    Comment: Glucose reference range applies only to samples taken after fasting for at least 8 hours.  Glucose, capillary     Status: Abnormal   Collection Time: 03/02/22  8:20 PM  Result Value Ref Range   Glucose-Capillary 206 (H) 70 - 99  mg/dL    Comment: Glucose reference range applies only to samples taken after fasting for at least 8 hours.   Comment 1 Notify RN   Glucose, capillary     Status: Abnormal   Collection Time: 03/03/22  7:50 AM  Result Value  Ref Range   Glucose-Capillary 228 (H) 70 - 99 mg/dL    Comment: Glucose reference range applies only to samples taken after fasting for at least 8 hours.  Glucose, capillary     Status: Abnormal   Collection Time: 03/03/22 11:45 AM  Result Value Ref Range   Glucose-Capillary 234 (H) 70 - 99 mg/dL    Comment: Glucose reference range applies only to samples taken after fasting for at least 8 hours.  Glucose, capillary     Status: Abnormal   Collection Time: 03/03/22  4:31 PM  Result Value Ref Range   Glucose-Capillary 324 (H) 70 - 99 mg/dL    Comment: Glucose reference range applies only to samples taken after fasting for at least 8 hours.  Glucose, capillary     Status: Abnormal   Collection Time: 03/03/22  8:20 PM  Result Value Ref Range   Glucose-Capillary 254 (H) 70 - 99 mg/dL    Comment: Glucose reference range applies only to samples taken after fasting for at least 8 hours.   Comment 1 Notify RN   Glucose, capillary     Status: Abnormal   Collection Time: 03/04/22  7:40 AM  Result Value Ref Range   Glucose-Capillary 173 (H) 70 - 99 mg/dL    Comment: Glucose reference range applies only to samples taken after fasting for at least 8 hours.    Blood Alcohol level:  Lab Results  Component Value Date   ETH <10 02/26/2022   ETH <10 10/18/2021    Metabolic Disorder Labs: Lab Results  Component Value Date   HGBA1C 6.3 (H) 02/20/2017   MPG 134.11 02/20/2017   MPG 177 07/31/2016   Lab Results  Component Value Date   PROLACTIN 23.3 02/20/2017   PROLACTIN 3.6 (L) 07/31/2016   Lab Results  Component Value Date   CHOL 220 (H) 02/20/2017   TRIG 132 02/20/2017   HDL 63 02/20/2017   CHOLHDL 3.5 02/20/2017   VLDL 26 02/20/2017   LDLCALC 131 (H)  02/20/2017   LDLCALC 101 (H) 07/31/2016    Physical Findings: AIMS:  , ,  ,  ,    CIWA:    COWS:     Musculoskeletal: Strength & Muscle Tone: within normal limits Gait & Station: normal Patient leans: N/A  Psychiatric Specialty Exam:  Presentation  General Appearance:  Casual  Eye Contact: Fair  Speech: Clear and Coherent  Speech Volume: Normal  Handedness: Right   Mood and Affect  Mood: Anxious  Affect: Inappropriate   Thought Process  Thought Processes: Disorganized (Mild)  Descriptions of Associations:Intact  Orientation:Full (Time, Place and Person)  Thought Content:Delusions  History of Schizophrenia/Schizoaffective disorder:No  Duration of Psychotic Symptoms:Greater than six months  Hallucinations:No data recorded Ideas of Reference:None  Suicidal Thoughts:No data recorded Homicidal Thoughts:No data recorded  Sensorium  Memory: Immediate Fair  Judgment: Impaired  Insight: Good   Executive Functions  Concentration: Fair  Attention Span: Fair  Recall: [employer] of Knowledge: Fair  Language: Fair   Psychomotor Activity  Psychomotor Activity:No data recorded  Assets  Assets: Resilience; Social Support; Health and safety inspector; Housing; Desire for Improvement   Sleep  Sleep:No data recorded   Physical Exam: Physical Exam Vitals and nursing note reviewed.  Constitutional:      Appearance: Normal appearance. She is normal weight.  Neurological:     General: No focal deficit present.     Mental Status: She is alert and oriented to person, place, and time.  Psychiatric:  Attention and Perception: Attention and perception normal.        Mood and Affect: Mood and affect normal.        Speech: Speech normal.        Behavior: Behavior normal. Behavior is cooperative.        Thought Content: Thought content normal.        Cognition and Memory: Cognition and memory normal.        Judgment: Judgment  normal.    Review of Systems  Constitutional: Negative.   HENT: Negative.    Eyes: Negative.   Respiratory: Negative.    Cardiovascular: Negative.   Gastrointestinal: Negative.   Genitourinary: Negative.   Musculoskeletal: Negative.   Skin: Negative.   Neurological: Negative.   Endo/Heme/Allergies: Negative.   Psychiatric/Behavioral: Negative.     Blood pressure 119/85, pulse 100, temperature 98.3 F (36.8 C), temperature source Oral, resp. rate 18, height 5\' 2"  (1.575 m), weight 76.4 kg, SpO2 100 %. Body mass index is 30.82 kg/m.   Treatment Plan Summary: Daily contact with patient to assess and evaluate symptoms and progress in treatment, Medication management, and Plan continue current medications.  Plan to discharge on Friday.  Korin Hartwell Wednesday, DO 03/04/2022, 11:02 AM

## 2022-03-04 NOTE — BH IP Treatment Plan (Signed)
Interdisciplinary Treatment and Diagnostic Plan Update  03/04/2022 Time of Session: 8:30AM Rita Sullivan MRN: [id number]  Principal Diagnosis: Schizoaffective disorder, bipolar type [facility])  Secondary Diagnoses: Principal Problem:   Schizoaffective disorder, bipolar type ([place])   Current Medications:  Current Facility-Administered Medications  Medication Dose Route Frequency Provider Last Rate Last Admin   acetaminophen (TYLENOL) tablet 650 mg  650 mg Oral Q6H PRN Sherlon Handing, NP       alum & mag hydroxide-simeth (MAALOX/MYLANTA) 200-200-20 MG/5ML suspension 30 mL  30 mL Oral Q4H PRN Waldon Merl F, NP       ARIPiprazole (ABILIFY) tablet 10 mg  10 mg Oral q morning Parks Ranger, DO   10 mg at 03/04/22 0905   atorvastatin (LIPITOR) tablet 40 mg  40 mg Oral Daily Waldon Merl F, NP   40 mg at 03/04/22 3086   carbamazepine (TEGRETOL) tablet 400 mg  400 mg Oral BID PC Parks Ranger, DO   400 mg at 03/04/22 0905   cholecalciferol (VITAMIN D3) 25 MCG (1000 UNIT) tablet 2,000 Units  2,000 Units Oral Daily Waldon Merl F, NP   2,000 Units at 57/84/69 6295   folic acid (FOLVITE) tablet 0.5 mg  500 mcg Oral Daily Waldon Merl F, NP   0.5 mg at 03/04/22 0905   glipiZIDE (GLUCOTROL) tablet 5 mg  5 mg Oral Q breakfast Waldon Merl F, NP   5 mg at 03/04/22 0756   hydrOXYzine (ATARAX) tablet 25 mg  25 mg Oral TID PRN Sherlon Handing, NP       insulin aspart (novoLOG) injection 0-15 Units  0-15 Units Subcutaneous TID WC Waldon Merl F, NP   3 Units at 03/04/22 0754   insulin aspart (novoLOG) injection 0-5 Units  0-5 Units Subcutaneous QHS Waldon Merl F, NP   3 Units at 03/03/22 2148   insulin glargine-yfgn (SEMGLEE) injection 10 Units  10 Units Subcutaneous QHS Parks Ranger, DO   10 Units at 03/03/22 2149   losartan (COZAAR) tablet 25 mg  25 mg Oral Daily Waldon Merl F, NP   25 mg at 03/04/22 2841   magnesium hydroxide (MILK OF  MAGNESIA) suspension 30 mL  30 mL Oral Daily PRN Waldon Merl F, NP       metFORMIN (GLUCOPHAGE) tablet 1,000 mg  1,000 mg Oral BID WC Waldon Merl F, NP   1,000 mg at 03/04/22 0756   traZODone (DESYREL) tablet 100 mg  100 mg Oral QHS Waldon Merl F, NP   100 mg at 03/03/22 2147   PTA Medications: Medications Prior to Admission  Medication Sig Dispense Refill Last Dose   ARIPiprazole (ABILIFY) 10 MG tablet Take 5 mg by mouth every morning.      atorvastatin (LIPITOR) 40 MG tablet Take 40 mg by mouth daily.      carbamazepine (TEGRETOL) 200 MG tablet Take 2 tablets (400 mg total) by mouth 2 (two) times daily after a meal. 120 tablet 0    cholecalciferol (VITAMIN D3) 25 MCG (1000 UNIT) tablet Take 2,000 Units by mouth daily.      folic acid (FOLVITE) 324 MCG tablet Take 400 mcg by mouth daily.      glipiZIDE (GLUCOTROL) 5 MG tablet Take 5 mg by mouth every morning.      JARDIANCE 10 MG TABS tablet Take 10 mg by mouth daily.      losartan (COZAAR) 25 MG tablet Take 25 mg by mouth daily.      metFORMIN (  GLUCOPHAGE) 500 MG tablet Take 1 tablet (500 mg total) by mouth 2 (two) times daily with a meal. (Patient taking differently: Take 1,000 mg by mouth 2 (two) times daily with a meal.) 60 tablet 0    Multiple Vitamins-Minerals (HAIR SKIN AND NAILS FORMULA) TABS Take 1 tablet by mouth daily.      traZODone (DESYREL) 100 MG tablet Take 1 tablet (100 mg total) by mouth at bedtime as needed for sleep. (Patient taking differently: Take 100 mg by mouth at bedtime.) 30 tablet 0    zinc sulfate 220 (50 Zn) MG capsule Take 220 mg by mouth daily.       Patient Stressors: Other: Pt states she has AH's telling her to continually go to the store.    Patient Strengths: Active sense of humor  Supportive family/friends   Treatment Modalities: Medication Management, Group therapy, Case management,  1 to 1 session with clinician, Psychoeducation, Recreational therapy.   Physician Treatment Plan for  Primary Diagnosis: Schizoaffective disorder, bipolar type (HCC) Long Term Goal(s): Improvement in symptoms so as ready for discharge   Short Term Goals: Ability to identify changes in lifestyle to reduce recurrence of condition will improve Ability to verbalize feelings will improve Ability to disclose and discuss suicidal ideas Ability to demonstrate self-control will improve Ability to identify and develop effective coping behaviors will improve Ability to maintain clinical measurements within normal limits will improve Compliance with prescribed medications will improve Ability to identify triggers associated with substance abuse/mental health issues will improve  Medication Management: Evaluate patient's response, side effects, and tolerance of medication regimen.  Therapeutic Interventions: 1 to 1 sessions, Unit Group sessions and Medication administration.  Evaluation of Outcomes: Progressing  Physician Treatment Plan for Secondary Diagnosis: Principal Problem:   Schizoaffective disorder, bipolar type (HCC)  Long Term Goal(s): Improvement in symptoms so as ready for discharge   Short Term Goals: Ability to identify changes in lifestyle to reduce recurrence of condition will improve Ability to verbalize feelings will improve Ability to disclose and discuss suicidal ideas Ability to demonstrate self-control will improve Ability to identify and develop effective coping behaviors will improve Ability to maintain clinical measurements within normal limits will improve Compliance with prescribed medications will improve Ability to identify triggers associated with substance abuse/mental health issues will improve     Medication Management: Evaluate patient's response, side effects, and tolerance of medication regimen.  Therapeutic Interventions: 1 to 1 sessions, Unit Group sessions and Medication administration.  Evaluation of Outcomes: Progressing   RN Treatment Plan for  Primary Diagnosis: Schizoaffective disorder, bipolar type (HCC) Long Term Goal(s): Knowledge of disease and therapeutic regimen to maintain health will improve  Short Term Goals: Ability to demonstrate self-control, Ability to participate in decision making will improve, Ability to verbalize feelings will improve, Ability to disclose and discuss suicidal ideas, Ability to identify and develop effective coping behaviors will improve, and Compliance with prescribed medications will improve  Medication Management: RN will administer medications as ordered by provider, will assess and evaluate patient's response and provide education to patient for prescribed medication. RN will report any adverse and/or side effects to prescribing provider.  Therapeutic Interventions: 1 on 1 counseling sessions, Psychoeducation, Medication administration, Evaluate responses to treatment, Monitor vital signs and CBGs as ordered, Perform/monitor CIWA, COWS, AIMS and Fall Risk screenings as ordered, Perform wound care treatments as ordered.  Evaluation of Outcomes: Progressing   LCSW Treatment Plan for Primary Diagnosis: Schizoaffective disorder, bipolar type (HCC) Long Term Goal(s): Safe transition  to appropriate next level of care at discharge, Engage patient in therapeutic group addressing interpersonal concerns.  Short Term Goals: Engage patient in aftercare planning with referrals and resources, Increase social support, Increase ability to appropriately verbalize feelings, Increase emotional regulation, Facilitate acceptance of mental health diagnosis and concerns, and Increase skills for wellness and recovery  Therapeutic Interventions: Assess for all discharge needs, 1 to 1 time with Social worker, Explore available resources and support systems, Assess for adequacy in community support network, Educate family and significant other(s) on suicide prevention, Complete Psychosocial Assessment, Interpersonal group  therapy.  Evaluation of Outcomes: Progressing   Progress in Treatment: Attending groups: Yes. Participating in groups: Yes. Taking medication as prescribed: Yes. Toleration medication: Yes. Family/Significant other contact made: Yes, individual(s) contacted:  SPE completed with the patient's husband. Patient understands diagnosis: Yes. Discussing patient identified problems/goals with staff: Yes. Medical problems stabilized or resolved: Yes. Denies suicidal/homicidal ideation: Yes. Issues/concerns per patient self-inventory: No. Other: none  New problem(s) identified: No, Describe:  none   New Short Term/Long Term Goal(s): elimination of symptoms of psychosis, medication management for mood stabilization; elimination of SI thoughts; development of comprehensive mental wellness plan.  Update 03/04/2022:  No changes at this time.    Patient Goals:  "get better"  Update 03/04/2022:  No changes at this time.    Discharge Plan or Barriers: CSW to assist patient in development of appropriate discharge plans.  Patient currently thinking of returning home and maintaining current mental health provider. Update 03/04/2022:  Patient remains on the unit and safe at this time.  Patient reports plans to continue with her current mental health providers.    Reason for Continuation of Hospitalization: Anxiety Depression Hallucinations Mania Medication stabilization   Estimated Length of Stay:  1-7 days Update 03/04/2022:  No changes at this time.   Last 3 [place] Suicide Severity Risk Score: Flowsheet Row Admission (Current) from 02/26/2022 in [facility] BEHAVIORAL MEDICINE Most recent reading at 03/03/2022  8:00 PM ED from 02/26/2022 in [facility] EMERGENCY DEPARTMENT Most recent reading at 02/26/2022 10:35 AM ED from 10/18/2021 in [facility] EMERGENCY DEPARTMENT Most recent reading at 10/19/2021 10:02 AM  C-SSRS RISK CATEGORY No Risk No Risk No  Risk       Last PHQ 2/9 Scores:    05/02/2015    1:50 PM  Depression screen PHQ 2/9  Decreased Interest 0  Down, Depressed, Hopeless 0  PHQ - 2 Score 0    Scribe for Treatment Team: Harden Mo, LCSW 03/04/2022 10:37 AM

## 2022-03-05 DIAGNOSIS — F250 Schizoaffective disorder, bipolar type: Secondary | ICD-10-CM | POA: Diagnosis not present

## 2022-03-05 LAB — GLUCOSE, CAPILLARY
Glucose-Capillary: 338 mg/dL — ABNORMAL HIGH (ref 70–99)
Glucose-Capillary: 252 mg/dL — ABNORMAL HIGH (ref 70–99)
Glucose-Capillary: 279 mg/dL — ABNORMAL HIGH (ref 70–99)
Glucose-Capillary: 351 mg/dL — ABNORMAL HIGH (ref 70–99)

## 2022-03-05 MED ORDER — LORAZEPAM 1 MG PO TABS
1.0000 mg | ORAL_TABLET | Freq: Four times a day (QID) | ORAL | Status: DC | PRN
  Administered 2022-03-11: 1 mg via ORAL
  Filled 2022-03-05: qty 1

## 2022-03-05 NOTE — Progress Notes (Signed)
[facility] MD Progress Note  03/05/2022 10:47 AM Rita Sullivan  MRN:  [id number] Subjective: Rita Sullivan is seen on rounds.  She has been acting more strangely.  Yesterday she refused blood sugar draws.  Her sodium was low when she was admitted but she has not really had any mental symptoms as far as confusion. She is more irritable so we will check some lab work in AM and order prn.  Principal Problem: Schizoaffective disorder, bipolar type (HCC) Diagnosis: Principal Problem:   Schizoaffective disorder, bipolar type (HCC)  Total Time spent with patient: 15 minutes  Past Psychiatric History: She has numerous past psychiatric admissions for psychosis and mania.  She has been on Geodon.  She tends to travel to distant destinations for no reason.  She has followed up at [facility] and a past history of psychiatry in [place].  Currently attends [employer].  Past Medical History:  Past Medical History:  Diagnosis Date   Bipolar disorder (HCC)    Chronic kidney disease    Diabetes mellitus without complication (HCC)    Hypercholesteremia    Manic behavior (HCC)     Past Surgical History:  Procedure Laterality Date   COLONOSCOPY WITH PROPOFOL N/A 03/11/2015   Procedure: COLONOSCOPY WITH PROPOFOL;  Surgeon: Wallace Cullens, MD;  Location: [facility] ENDOSCOPY;  Service: Gastroenterology;  Laterality: N/A;   Family History:  Family History  Problem Relation Age of Onset   Diabetes Father    Diabetes Sister     Social History:  Social History   Substance and Sexual Activity  Alcohol Use Yes   Alcohol/week: 2.0 standard drinks of alcohol   Types: 2 Glasses of wine per week   Comment: Occ     Social History   Substance and Sexual Activity  Drug Use No    Social History   Socioeconomic History   Marital status: Married    Spouse name: Not on file   Number of children: Not on file   Years of education: Not on file   Highest education level: Not on file  Occupational History   Not on file   Tobacco Use   Smoking status: Never   Smokeless tobacco: Never  Substance and Sexual Activity   Alcohol use: Yes    Alcohol/week: 2.0 standard drinks of alcohol    Types: 2 Glasses of wine per week    Comment: Occ   Drug use: No   Sexual activity: Never  Other Topics Concern   Not on file  Social History Narrative   Not on file   Social Determinants of Health   Financial Resource Strain: Not on file  Food Insecurity: No Food Insecurity (02/26/2022)   Hunger Vital Sign    Worried About Running Out of Food in the Last Year: Never true    Ran Out of Food in the Last Year: Never true  Transportation Needs: No Transportation Needs (02/26/2022)   PRAPARE - Administrator, Civil Service (Medical): No    Lack of Transportation (Non-Medical): No  Physical Activity: Not on file  Stress: Not on file  Social Connections: Not on file   Additional Social History:    Pain Medications: See PTA Prescriptions: See PTA Over the Counter: See PTA History of alcohol / drug use?: No history of alcohol / drug abuse Longest period of sobriety (when/how long): Unable to quantify                    Sleep: Fair  Appetite:  Good  Current Medications: Current Facility-Administered Medications  Medication Dose Route Frequency Provider Last Rate Last Admin   acetaminophen (TYLENOL) tablet 650 mg  650 mg Oral Q6H PRN Sherlon Handing, NP       alum & mag hydroxide-simeth (MAALOX/MYLANTA) 200-200-20 MG/5ML suspension 30 mL  30 mL Oral Q4H PRN Waldon Merl F, NP       ARIPiprazole (ABILIFY) tablet 10 mg  10 mg Oral q morning Parks Ranger, DO   10 mg at 03/05/22 0746   atorvastatin (LIPITOR) tablet 40 mg  40 mg Oral Daily Waldon Merl F, NP   40 mg at 03/05/22 0747   carbamazepine (TEGRETOL) tablet 400 mg  400 mg Oral BID PC Parks Ranger, DO   400 mg at 03/05/22 0746   cholecalciferol (VITAMIN D3) 25 MCG (1000 UNIT) tablet 2,000 Units  2,000 Units  Oral Daily Waldon Merl F, NP   2,000 Units at [id number]   folic acid (FOLVITE) tablet 0.5 mg  500 mcg Oral Daily Waldon Merl F, NP   0.5 mg at 03/05/22 0746   glipiZIDE (GLUCOTROL) tablet 5 mg  5 mg Oral Q breakfast Waldon Merl F, NP   5 mg at 03/05/22 0745   hydrOXYzine (ATARAX) tablet 25 mg  25 mg Oral TID PRN Sherlon Handing, NP       insulin aspart (novoLOG) injection 0-15 Units  0-15 Units Subcutaneous TID WC Sherlon Handing, NP   11 Units at 03/05/22 0745   insulin aspart (novoLOG) injection 0-5 Units  0-5 Units Subcutaneous QHS Waldon Merl F, NP   3 Units at 03/04/22 2129   insulin glargine-yfgn (SEMGLEE) injection 10 Units  10 Units Subcutaneous QHS Parks Ranger, DO   10 Units at 03/04/22 2130   losartan (COZAAR) tablet 25 mg  25 mg Oral Daily Waldon Merl F, NP   25 mg at 03/05/22 0747   magnesium hydroxide (MILK OF MAGNESIA) suspension 30 mL  30 mL Oral Daily PRN Waldon Merl F, NP       metFORMIN (GLUCOPHAGE) tablet 1,000 mg  1,000 mg Oral BID WC Waldon Merl F, NP   1,000 mg at 03/05/22 0745   traZODone (DESYREL) tablet 100 mg  100 mg Oral QHS Waldon Merl F, NP   100 mg at 03/04/22 2130    Lab Results:  Results for orders placed or performed during the hospital encounter of 02/26/22 (from the past 48 hour(s))  Glucose, capillary     Status: Abnormal   Collection Time: 03/03/22 11:45 AM  Result Value Ref Range   Glucose-Capillary 234 (H) 70 - 99 mg/dL    Comment: Glucose reference range applies only to samples taken after fasting for at least 8 hours.  Glucose, capillary     Status: Abnormal   Collection Time: 03/03/22  4:31 PM  Result Value Ref Range   Glucose-Capillary 324 (H) 70 - 99 mg/dL    Comment: Glucose reference range applies only to samples taken after fasting for at least 8 hours.  Glucose, capillary     Status: Abnormal   Collection Time: 03/03/22  8:20 PM  Result Value Ref Range   Glucose-Capillary 254 (H)  70 - 99 mg/dL    Comment: Glucose reference range applies only to samples taken after fasting for at least 8 hours.   Comment 1 Notify RN   Glucose, capillary     Status: Abnormal   Collection Time: 03/04/22  7:40 AM  Result Value Ref Range   Glucose-Capillary 173 (H) 70 - 99 mg/dL    Comment: Glucose reference range applies only to samples taken after fasting for at least 8 hours.  Glucose, capillary     Status: Abnormal   Collection Time: 03/04/22 11:37 AM  Result Value Ref Range   Glucose-Capillary 263 (H) 70 - 99 mg/dL    Comment: Glucose reference range applies only to samples taken after fasting for at least 8 hours.  Glucose, capillary     Status: Abnormal   Collection Time: 03/04/22  8:03 PM  Result Value Ref Range   Glucose-Capillary 320 (H) 70 - 99 mg/dL    Comment: Glucose reference range applies only to samples taken after fasting for at least 8 hours.   Comment 1 Notify RN   Glucose, capillary     Status: Abnormal   Collection Time: 03/05/22  7:27 AM  Result Value Ref Range   Glucose-Capillary 338 (H) 70 - 99 mg/dL    Comment: Glucose reference range applies only to samples taken after fasting for at least 8 hours.    Blood Alcohol level:  Lab Results  Component Value Date   ETH <10 02/26/2022   ETH <10 [id number]    Metabolic Disorder Labs: Lab Results  Component Value Date   HGBA1C 6.3 (H) 02/20/2017   MPG 134.11 02/20/2017   MPG 177 07/31/2016   Lab Results  Component Value Date   PROLACTIN 23.3 02/20/2017   PROLACTIN 3.6 (L) 07/31/2016   Lab Results  Component Value Date   CHOL 220 (H) 02/20/2017   TRIG 132 02/20/2017   HDL 63 02/20/2017   CHOLHDL 3.5 02/20/2017   VLDL 26 02/20/2017   LDLCALC 131 (H) 02/20/2017   LDLCALC 101 (H) 07/31/2016    Physical Findings: AIMS:  , ,  ,  ,    CIWA:    COWS:     Musculoskeletal: Strength & Muscle Tone: within normal limits Gait & Station: normal Patient leans: N/A  Psychiatric Specialty  Exam:  Presentation  General Appearance:  Casual  Eye Contact: Fair  Speech: Clear and Coherent  Speech Volume: Normal  Handedness: Right   Mood and Affect  Mood: Anxious  Affect: Inappropriate   Thought Process  Thought Processes: Disorganized (Mild)  Descriptions of Associations:Intact  Orientation:Full (Time, Place and Person)  Thought Content:Delusions  History of Schizophrenia/Schizoaffective disorder:No  Duration of Psychotic Symptoms:Greater than six months  Hallucinations:No data recorded Ideas of Reference:None  Suicidal Thoughts:No data recorded Homicidal Thoughts:No data recorded  Sensorium  Memory: Immediate Fair  Judgment: Impaired  Insight: Good   Executive Functions  Concentration: Fair  Attention Span: Fair  Recall: [employer] of Knowledge: Fair  Language: Fair   Psychomotor Activity  Psychomotor Activity:No data recorded  Assets  Assets: Resilience; Social Support; Catering manager; Housing; Desire for Improvement   Sleep  Sleep:No data recorded   Physical Exam: Physical Exam Vitals and nursing note reviewed.  Constitutional:      Appearance: Normal appearance. She is normal weight.  Neurological:     General: No focal deficit present.     Mental Status: She is alert and oriented to person, place, and time.  Psychiatric:        Attention and Perception: Attention and perception normal.        Mood and Affect: Mood normal. Affect is inappropriate.        Speech: Speech is delayed.        Behavior: Behavior  normal. Behavior is cooperative.        Thought Content: Thought content is paranoid.        Cognition and Memory: Cognition and memory normal.        Judgment: Judgment is inappropriate.    Review of Systems  Constitutional: Negative.   HENT: Negative.    Eyes: Negative.   Respiratory: Negative.    Cardiovascular: Negative.   Gastrointestinal: Negative.   Genitourinary:  Negative.   Musculoskeletal: Negative.   Skin: Negative.   Neurological: Negative.   Endo/Heme/Allergies: Negative.   Psychiatric/Behavioral: Negative.     Blood pressure 122/80, pulse 94, temperature 98.8 F (37.1 C), resp. rate 18, height 5\' 2"  (1.575 m), weight 76.4 kg, SpO2 99 %. Body mass index is 30.82 kg/m.   Treatment Plan Summary: Daily contact with patient to assess and evaluate symptoms and progress in treatment, Medication management, and Plan Prn Ativan and cmp, lipid panel, HgbA1C , tegretol level in AM.  Parks Ranger, DO 03/05/2022, 10:47 AM

## 2022-03-05 NOTE — Progress Notes (Signed)
Patient denies SI, HI, and AVH. CBG was 331, 11 units insulin given per MD orders. Patient is compliant with scheduled medications. During breakfast, patient is initially calm and cooperative. Later in the morning, patient is seen pacing the halls with balled up fists. Patient says "don't talk to me right now" when asked if she needed anything. Patient remains safe on the unit at this time.

## 2022-03-05 NOTE — Inpatient Diabetes Management (Signed)
Inpatient Diabetes Program Recommendations  AACE/ADA: New Consensus Statement on Inpatient Glycemic Control (2015)  Target Ranges:  Prepandial:   less than 140 mg/dL      Peak postprandial:   less than 180 mg/dL (1-2 hours)      Critically ill patients:  140 - 180 mg/dL   Lab Results  Component Value Date   GLUCAP 252 (H) 03/05/2022   HGBA1C 6.3 (H) 02/20/2017    Review of Glycemic Control  Latest Reference Range & Units 03/05/22 07:27 03/05/22 11:30  Glucose-Capillary 70 - 99 mg/dL 338 (H) 252 (H)  (H): Data is abnormally high  Diabetes history: DM2 Outpatient Diabetes medications:  Metformin 1000 mg BID Jardiance 10 mg QD Glipizide 5 mg QD Current orders for Inpatient glycemic control:  Novolog 0-15 units TID and 0-5 units QHS Glipizide 5 mg QD Metformin 1000 mg BID Semglee 10 units QD  Inpatient Diabetes Program Recommendations:    Semglee 15 units QHS  Will continue to follow while inpatient.  Thank you, Reche Dixon, MSN, [place] Diabetes Coordinator Inpatient Diabetes Program [phone] (team pager from 8a-5p)

## 2022-03-05 NOTE — Group Note (Signed)
[place] LCSW Group Therapy Note   Group Date: 03/05/2022 Start Time: 1330 End Time: 1430   Type of Therapy/Topic:  Group Therapy:  Emotion Regulation  Participation Level:  None   Mood:  Description of Group:    The purpose of this group is to assist patients in learning to regulate negative emotions and experience positive emotions. Patients will be guided to discuss ways in which they have been vulnerable to their negative emotions. These vulnerabilities will be juxtaposed with experiences of positive emotions or situations, and patients challenged to use positive emotions to combat negative ones. Special emphasis will be placed on coping with negative emotions in conflict situations, and patients will process healthy conflict resolution skills.  Therapeutic Goals: Patient will identify two positive emotions or experiences to reflect on in order to balance out negative emotions:  Patient will label two or more emotions that they find the most difficult to experience:  Patient will be able to demonstrate positive conflict resolution skills through discussion or role plays:   Summary of Patient Progress: Patient attended group at the beginning, however, did not stay for group.  Patient refused to sit and stood during her time in group with her fist clenched.   Therapeutic Modalities:   Cognitive Behavioral Therapy Feelings Identification Dialectical Behavioral Therapy   [name], LCSW

## 2022-03-06 DIAGNOSIS — F250 Schizoaffective disorder, bipolar type: Secondary | ICD-10-CM | POA: Diagnosis not present

## 2022-03-06 LAB — COMPREHENSIVE METABOLIC PANEL
ALT: 19 U/L (ref 0–44)
AST: 22 U/L (ref 15–41)
Albumin: 3.9 g/dL (ref 3.5–5.0)
Alkaline Phosphatase: 84 U/L (ref 38–126)
Anion gap: 11 (ref 5–15)
BUN: 12 mg/dL (ref 8–23)
CO2: 22 mmol/L (ref 22–32)
Calcium: 9.5 mg/dL (ref 8.9–10.3)
Chloride: 102 mmol/L (ref 98–111)
Creatinine, Ser: 0.6 mg/dL (ref 0.44–1.00)
GFR, Estimated: >60 mL/min (ref >60)
Glucose, Bld: 349 mg/dL — ABNORMAL HIGH (ref 70–99)
Potassium: 4.3 mmol/L (ref 3.5–5.1)
Sodium: 135 mmol/L (ref 135–145)
Total Bilirubin: 0.6 mg/dL (ref 0.3–1.2)
Total Protein: 6.4 g/dL — ABNORMAL LOW (ref 6.5–8.1)

## 2022-03-06 LAB — LIPID PANEL
Cholesterol: 194 mg/dL (ref 0–200)
HDL: 63 mg/dL (ref >40)
LDL Cholesterol: 108 mg/dL — ABNORMAL HIGH (ref 0–99)
Total CHOL/HDL Ratio: 3.1 RATIO
Triglycerides: 113 mg/dL (ref <150)
VLDL: 23 mg/dL (ref 0–40)

## 2022-03-06 LAB — CARBAMAZEPINE LEVEL, TOTAL: Carbamazepine Lvl: 4.5 ug/mL (ref 4.0–12.0)

## 2022-03-06 LAB — GLUCOSE, CAPILLARY
Glucose-Capillary: 204 mg/dL — ABNORMAL HIGH (ref 70–99)
Glucose-Capillary: 192 mg/dL — ABNORMAL HIGH (ref 70–99)
Glucose-Capillary: 209 mg/dL — ABNORMAL HIGH (ref 70–99)
Glucose-Capillary: 285 mg/dL — ABNORMAL HIGH (ref 70–99)

## 2022-03-06 LAB — HEMOGLOBIN A1C
Hgb A1c MFr Bld: 8.3 % — ABNORMAL HIGH (ref 4.8–5.6)
Mean Plasma Glucose: 191.51 mg/dL

## 2022-03-06 MED ORDER — CARBAMAZEPINE 200 MG PO TABS
400.0000 mg | ORAL_TABLET | Freq: Three times a day (TID) | ORAL | Status: DC
  Administered 2022-03-06 – 2022-03-13 (×21): 400 mg via ORAL
  Filled 2022-03-06 (×20): qty 2

## 2022-03-06 NOTE — Progress Notes (Signed)
Patient is animated in the morning. She is cooperative with vital signs and getting CBG. Patient appetite is good and she slept well. After returning to her room after breakfast, patient refused to answer questions about SI, HI, and AVH. Patient stared at this Probation officer. She is compliant with scheduled medications. Patient remains safe on the unit at this time.

## 2022-03-06 NOTE — Progress Notes (Signed)
Patient was cooperative with treatment, she denies SI, HI & AVH. She spent most of the shift resting in bed. She voiced no concerns on shift and she was compliant with medication regime. She seemed to rest well through out the night.

## 2022-03-06 NOTE — Group Note (Signed)
[place] LCSW Group Therapy Note   Group Date: 03/06/2022 Start Time: 1330 End Time: 1430  Type of Therapy and Topic:  Group Therapy:  Feelings around Relapse and Recovery  Participation Level:  None   Mood:  Description of Group:    Patients in this group will discuss emotions they experience before and after a relapse. They will process how experiencing these feelings, or avoidance of experiencing them, relates to having a relapse. Facilitator will guide patients to explore emotions they have related to recovery. Patients will be encouraged to process which emotions are more powerful. They will be guided to discuss the emotional reaction significant others in their lives may have to patients' relapse or recovery. Patients will be assisted in exploring ways to respond to the emotions of others without this contributing to a relapse.  Therapeutic Goals: Patient will identify two or more emotions that lead to relapse for them:  Patient will identify two emotions that result when they relapse:  Patient will identify two emotions related to recovery:  Patient will demonstrate ability to communicate their needs through discussion and/or role plays.   Summary of Patient Progress:  Patient sat in group but did not engage in discussion.  Pt sat with her eyes closed and sometimes made grimaces as if in pain.  At one point patient randomly burst out in laughter when no one else was speaking and the time did not call for it.  Patient later observed yelling at the MHT and refusing her glucose check.   Therapeutic Modalities:   Cognitive Behavioral Therapy Solution-Focused Therapy Assertiveness Training Relapse Prevention Therapy   [name], LCSW

## 2022-03-06 NOTE — Inpatient Diabetes Management (Signed)
Inpatient Diabetes Program Recommendations  AACE/ADA: New Consensus Statement on Inpatient Glycemic Control (2015)  Target Ranges:  Prepandial:   less than 140 mg/dL      Peak postprandial:   less than 180 mg/dL (1-2 hours)      Critically ill patients:  140 - 180 mg/dL    Latest Reference Range & Units 03/05/22 07:27 03/05/22 11:30 03/05/22 16:16 03/05/22 19:28  Glucose-Capillary 70 - 99 mg/dL 338 (H)  11 units Novolog  252 (H)  8 units Novolog  279 (H)  8 units Novolog  351 (H)  5 units Novolog @2134   10 units Semglee @2136   (H): Data is abnormally high  Latest Reference Range & Units 03/06/22 07:24  Glucose-Capillary 70 - 99 mg/dL 204 (H)  5 units Novolog   (H): Data is abnormally high    Home DM Meds: Metformin 1000 mg BID   Jardiance 10 mg daily    Glipizide 5 mg daily   Current Orders: Semglee 10 units QHS Novolog 0-15 units tid and 0-5 qhs Glipizide 5 mg daily Metformin 1000 mg BID    MD- Please consider:  1. Increase Semglee to 15 units QHS  2. Start Novolog Meal Coverage: Novolog 4 units TID with meals HOLD it pt NPO HOLD if pt eats <50% meals    --Will follow patient during hospitalization--  Wyn Quaker RN, MSN, [place] Diabetes Coordinator Inpatient Glycemic Control Team Team Pager: [phone] (8a-5p)

## 2022-03-06 NOTE — Progress Notes (Signed)
[facility] MD Progress Note  03/06/2022 11:01 AM Rita Sullivan  MRN:  [id number] Subjective: Arbor is seen on rounds.  She has had some bizarre behavior when she gets up during group and clenches her fist and paces. She is easily agitated.  She is pleasant and cooperative when talking with me.  Her CBC was within normal limits.  CMP was within normal limits.  Sodium returned to normal.  Tegretol level on 10/6 was 6.8 and today it is 4.5. She states that she is sleeping well.  She is tolerating the medicine.  She denies any side effects.  Principal Problem: Schizoaffective disorder, bipolar type ([place]) Diagnosis: Principal Problem:   Schizoaffective disorder, bipolar type ([place])  Total Time spent with patient: 15 minutes  Past Psychiatric History: She has numerous past psychiatric admissions for psychosis and mania.  She has been on Geodon.  She tends to travel to distant destinations for no reason.  She has followed up at [facility] and a past history of psychiatry in [place].  Currently attends [employer].  Past Medical History:  Past Medical History:  Diagnosis Date   Bipolar disorder ([place])    Chronic kidney disease    Diabetes mellitus without complication ([place])    Hypercholesteremia    Manic behavior ([place])     Past Surgical History:  Procedure Laterality Date   COLONOSCOPY WITH PROPOFOL N/A 03/11/2015   Procedure: COLONOSCOPY WITH PROPOFOL;  Surgeon: Hulen Luster, MD;  Location: [facility] ENDOSCOPY;  Service: Gastroenterology;  Laterality: N/A;   Family History:  Family History  Problem Relation Age of Onset   Diabetes Father    Diabetes Sister     Social History:  Social History   Substance and Sexual Activity  Alcohol Use Yes   Alcohol/week: 2.0 standard drinks of alcohol   Types: 2 Glasses of wine per week   Comment: Occ     Social History   Substance and Sexual Activity  Drug Use No    Social History   Socioeconomic History   Marital status: Married    Spouse name:  Not on file   Number of children: Not on file   Years of education: Not on file   Highest education level: Not on file  Occupational History   Not on file  Tobacco Use   Smoking status: Never   Smokeless tobacco: Never  Substance and Sexual Activity   Alcohol use: Yes    Alcohol/week: 2.0 standard drinks of alcohol    Types: 2 Glasses of wine per week    Comment: Occ   Drug use: No   Sexual activity: Never  Other Topics Concern   Not on file  Social History Narrative   Not on file   Social Determinants of Health   Financial Resource Strain: Not on file  Food Insecurity: No Food Insecurity (02/26/2022)   Hunger Vital Sign    Worried About Running Out of Food in the Last Year: Never true    Ran Out of Food in the Last Year: Never true  Transportation Needs: No Transportation Needs (02/26/2022)   PRAPARE - Hydrologist (Medical): No    Lack of Transportation (Non-Medical): No  Physical Activity: Not on file  Stress: Not on file  Social Connections: Not on file   Additional Social History:    Pain Medications: See PTA Prescriptions: See PTA Over the Counter: See PTA History of alcohol / drug use?: No history of alcohol / drug abuse  Longest period of sobriety (when/how long): Unable to quantify                    Sleep: Good  Appetite:  Good  Current Medications: Current Facility-Administered Medications  Medication Dose Route Frequency Provider Last Rate Last Admin   acetaminophen (TYLENOL) tablet 650 mg  650 mg Oral Q6H PRN Vanetta Mulders, NP       alum & mag hydroxide-simeth (MAALOX/MYLANTA) 200-200-20 MG/5ML suspension 30 mL  30 mL Oral Q4H PRN Gabriel Cirri F, NP       ARIPiprazole (ABILIFY) tablet 10 mg  10 mg Oral q morning Sarina Ill, DO   10 mg at 03/06/22 0816   atorvastatin (LIPITOR) tablet 40 mg  40 mg Oral Daily Gabriel Cirri F, NP   40 mg at 03/06/22 0816   carbamazepine (TEGRETOL) tablet 400 mg   400 mg Oral BID PC Sarina Ill, DO   400 mg at 03/06/22 0816   cholecalciferol (VITAMIN D3) 25 MCG (1000 UNIT) tablet 2,000 Units  2,000 Units Oral Daily Gabriel Cirri F, NP   2,000 Units at 03/06/22 0816   folic acid (FOLVITE) tablet 0.5 mg  500 mcg Oral Daily Gabriel Cirri F, NP   0.5 mg at 03/06/22 0816   glipiZIDE (GLUCOTROL) tablet 5 mg  5 mg Oral Q breakfast Gabriel Cirri F, NP   5 mg at 03/06/22 0819   insulin aspart (novoLOG) injection 0-15 Units  0-15 Units Subcutaneous TID WC Gabriel Cirri F, NP   5 Units at 03/06/22 0737   insulin aspart (novoLOG) injection 0-5 Units  0-5 Units Subcutaneous QHS Gabriel Cirri F, NP   5 Units at 03/05/22 2134   insulin glargine-yfgn (SEMGLEE) injection 10 Units  10 Units Subcutaneous QHS Sarina Ill, DO   10 Units at 03/05/22 2136   LORazepam (ATIVAN) tablet 1 mg  1 mg Oral Q6H PRN Sarina Ill, DO       losartan (COZAAR) tablet 25 mg  25 mg Oral Daily Gabriel Cirri F, NP   25 mg at 03/06/22 0816   magnesium hydroxide (MILK OF MAGNESIA) suspension 30 mL  30 mL Oral Daily PRN Gabriel Cirri F, NP       metFORMIN (GLUCOPHAGE) tablet 1,000 mg  1,000 mg Oral BID WC Gabriel Cirri F, NP   1,000 mg at 03/06/22 0981   traZODone (DESYREL) tablet 100 mg  100 mg Oral QHS Gabriel Cirri F, NP   100 mg at 03/05/22 2107    Lab Results:  Results for orders placed or performed during the hospital encounter of 02/26/22 (from the past 48 hour(s))  Glucose, capillary     Status: Abnormal   Collection Time: 03/04/22 11:37 AM  Result Value Ref Range   Glucose-Capillary 263 (H) 70 - 99 mg/dL    Comment: Glucose reference range applies only to samples taken after fasting for at least 8 hours.  Glucose, capillary     Status: Abnormal   Collection Time: 03/04/22  8:03 PM  Result Value Ref Range   Glucose-Capillary 320 (H) 70 - 99 mg/dL    Comment: Glucose reference range applies only to samples taken after fasting  for at least 8 hours.   Comment 1 Notify RN   Glucose, capillary     Status: Abnormal   Collection Time: 03/05/22  7:27 AM  Result Value Ref Range   Glucose-Capillary 338 (H) 70 - 99 mg/dL    Comment: Glucose reference  range applies only to samples taken after fasting for at least 8 hours.  Glucose, capillary     Status: Abnormal   Collection Time: 03/05/22 11:30 AM  Result Value Ref Range   Glucose-Capillary 252 (H) 70 - 99 mg/dL    Comment: Glucose reference range applies only to samples taken after fasting for at least 8 hours.  Glucose, capillary     Status: Abnormal   Collection Time: 03/05/22  4:16 PM  Result Value Ref Range   Glucose-Capillary 279 (H) 70 - 99 mg/dL    Comment: Glucose reference range applies only to samples taken after fasting for at least 8 hours.  Glucose, capillary     Status: Abnormal   Collection Time: 03/05/22  7:28 PM  Result Value Ref Range   Glucose-Capillary 351 (H) 70 - 99 mg/dL    Comment: Glucose reference range applies only to samples taken after fasting for at least 8 hours.  Glucose, capillary     Status: Abnormal   Collection Time: 03/06/22  7:24 AM  Result Value Ref Range   Glucose-Capillary 204 (H) 70 - 99 mg/dL    Comment: Glucose reference range applies only to samples taken after fasting for at least 8 hours.  Lipid panel     Status: Abnormal   Collection Time: 03/06/22  8:27 AM  Result Value Ref Range   Cholesterol 194 0 - 200 mg/dL   Triglycerides 161 <096 mg/dL   HDL 63 >04 mg/dL   Total CHOL/HDL Ratio 3.1 RATIO   VLDL 23 0 - 40 mg/dL   LDL Cholesterol 540 (H) 0 - 99 mg/dL    Comment:        Total Cholesterol/HDL:CHD Risk Coronary Heart Disease Risk Table                     Men   Women  1/2 Average Risk   3.4   3.3  Average Risk       5.0   4.4  2 X Average Risk   9.6   7.1  3 X Average Risk  23.4   11.0        Use the calculated Patient Ratio above and the CHD Risk Table to determine the patient's CHD Risk.        ATP  III CLASSIFICATION (LDL):  <100     mg/dL   Optimal  981-191  mg/dL   Near or Above                    Optimal  130-159  mg/dL   Borderline  478-295  mg/dL   High  >621     mg/dL   Very High Performed at [facility], [address] [place], [place] [address]   Carbamazepine level, total     Status: None   Collection Time: 03/06/22  8:27 AM  Result Value Ref Range   Carbamazepine Lvl 4.5 4.0 - 12.0 ug/mL    Comment: Performed at [facility], [address] [place], [place] [address]  Comprehensive metabolic panel     Status: Abnormal   Collection Time: 03/06/22  8:27 AM  Result Value Ref Range   Sodium 135 135 - 145 mmol/L   Potassium 4.3 3.5 - 5.1 mmol/L   Chloride 102 98 - 111 mmol/L   CO2 22 22 - 32 mmol/L   Glucose, Bld 349 (H) 70 - 99 mg/dL    Comment: Glucose reference range applies  only to samples taken after fasting for at least 8 hours.   BUN 12 8 - 23 mg/dL   Creatinine, Ser 1.610.60 0.44 - 1.00 mg/dL   Calcium 9.5 8.9 - 09.610.3 mg/dL   Total Protein 6.4 (L) 6.5 - 8.1 g/dL   Albumin 3.9 3.5 - 5.0 g/dL   AST 22 15 - 41 U/L   ALT 19 0 - 44 U/L   Alkaline Phosphatase 84 38 - 126 U/L   Total Bilirubin 0.6 0.3 - 1.2 mg/dL   GFR, Estimated >04>60 >54>60 mL/min    Comment: (NOTE) Calculated using the CKD-EPI Creatinine Equation (2021)    Anion gap 11 5 - 15    Comment: Performed at [facility], [address] [place], [place] [address]    Blood Alcohol level:  Lab Results  Component Value Date   [facility]   ETH <10 10/18/2021    Metabolic Disorder Labs: Lab Results  Component Value Date   HGBA1C 6.3 (H) 02/20/2017   MPG 134.11 02/20/2017   MPG 177 07/31/2016   Lab Results  Component Value Date   PROLACTIN 23.3 02/20/2017   PROLACTIN 3.6 (L) 07/31/2016   Lab Results  Component Value Date   CHOL 194 03/06/2022   TRIG 113 03/06/2022   HDL 63 03/06/2022   CHOLHDL 3.1 03/06/2022   VLDL 23 03/06/2022   LDLCALC 108 (H)  03/06/2022   LDLCALC 131 (H) 02/20/2017    Physical Findings: AIMS:  , ,  ,  ,    CIWA:    COWS:     Musculoskeletal: Strength & Muscle Tone: within normal limits Gait & Station: normal Patient leans: N/A  Psychiatric Specialty Exam:  Presentation  General Appearance:  Casual  Eye Contact: Fair  Speech: Clear and Coherent  Speech Volume: Normal  Handedness: Right   Mood and Affect  Mood: Anxious  Affect: Inappropriate   Thought Process  Thought Processes: Disorganized ([place]ild)  Descriptions of Associations:Intact  Orientation:Full (Time, Place and Person)  Thought Content:Delusions  History of Schizophrenia/Schizoaffective disorder:No  Duration of Psychotic Symptoms:Greater than six months  Hallucinations:No data recorded Ideas of Reference:None  Suicidal Thoughts:No data recorded Homicidal Thoughts:No data recorded  Sensorium  Memory: Immediate Fair  Judgment: Impaired  Insight: Good   Executive Functions  Concentration: Fair  Attention Span: Fair  Recall: [employer]  Fund of Knowledge: Fair  Language: Fair   Psychomotor Activity  Psychomotor Activity:No data recorded  Assets  Assets: Resilience; Social Support; Health and safety inspectorinancial Resources/Insurance; Housing; Desire for Improvement   Sleep  Sleep:No data recorded   Physical Exam: Physical Exam Vitals and nursing note reviewed.  Constitutional:      Appearance: Normal appearance. She is normal weight.  Neurological:     General: No focal deficit present.     Mental Status: She is alert and oriented to person, place, and time.  Psychiatric:        Attention and Perception: Attention and perception normal.        Mood and Affect: Mood normal. Affect is labile and inappropriate.        Speech: Speech normal.        Behavior: Behavior normal. Behavior is cooperative.        Thought Content: Thought content normal.        Cognition and Memory: Cognition and memory normal.         Judgment: Judgment is impulsive and inappropriate.    Review of Systems  Constitutional: Negative.   HENT:  Negative.    Eyes: Negative.   Respiratory: Negative.    Cardiovascular: Negative.   Gastrointestinal: Negative.   Genitourinary: Negative.   Musculoskeletal: Negative.   Skin: Negative.   Neurological: Negative.   Endo/Heme/Allergies: Negative.   Psychiatric/Behavioral: Negative.     Blood pressure 103/88, pulse 95, temperature 98.1 F (36.7 C), temperature source Oral, resp. rate 18, height 5\' 2"  (1.575 m), weight 76.4 kg, SpO2 100 %. Body mass index is 30.82 kg/m.   Treatment Plan Summary: Daily contact with patient to assess and evaluate symptoms and progress in treatment, Medication management, and Plan increase Tegretol to 400 mg 3 times a day.  Gladyce Mcray , DO 03/06/2022, 11:01 AM

## 2022-03-07 DIAGNOSIS — F250 Schizoaffective disorder, bipolar type: Secondary | ICD-10-CM | POA: Diagnosis not present

## 2022-03-07 LAB — GLUCOSE, CAPILLARY
Glucose-Capillary: 221 mg/dL — ABNORMAL HIGH (ref 70–99)
Glucose-Capillary: 207 mg/dL — ABNORMAL HIGH (ref 70–99)
Glucose-Capillary: 131 mg/dL — ABNORMAL HIGH (ref 70–99)
Glucose-Capillary: 209 mg/dL — ABNORMAL HIGH (ref 70–99)

## 2022-03-07 NOTE — BH Assessment (Signed)
2015 Received patient in the day room socializing with some of her peers; interaction appear to be positive. She does not stay long in the and return to her room.  2230 Patient is medication compliant. She received sliding scale insulin 2 units for BS of 204 and schedule 10 units in left arm   0100 Patient has remained asleep in her bed   0430 Patient up at  to ask for a cup H20 and then returned to bed.

## 2022-03-07 NOTE — Progress Notes (Signed)
NT found dinner tray in patient's room.  Patient told this was not allowed. Tray removed.

## 2022-03-07 NOTE — Progress Notes (Signed)
[facility] MD Progress Note  03/07/2022 12:01 PM Rita Sullivan  MRN:  [id number] Subjective: Rita Sullivan continues to have an odd affect.  She states that she feels fine.  I told her I went up on her Tegretol because her level was low.  She just stared at me and did not say anything.  She does not have any complaints.  No side effects and no issues.  Principal Problem: Schizoaffective disorder, bipolar type (HCC) Diagnosis: Principal Problem:   Schizoaffective disorder, bipolar type (HCC)  Total Time spent with patient: 15 minutes  Past Psychiatric History:  She has numerous past psychiatric admissions for psychosis and mania.  She has been on Geodon.  She tends to travel to distant destinations for no reason.  She has followed up at [facility] and a past history of psychiatry in [place].  Currently att[facility].  Past Medical History:  Past Medical History:  Diagnosis Date   Bipolar disorder (HCC)    Chronic kidney disease    Diabetes mellitus without complication (HCC)    Hypercholesteremia    Manic behavior (HCC)     Past Surgical History:  Procedure Laterality Date   COLONOSCOPY WITH PROPOFOL N/A 03/11/2015   Procedure: COLONOSCOPY WITH PROPOFOL;  Surgeon: Wallace Cullens, MD;  Location: [facility] ENDOSCOPY;  Service: Gastroenterology;  Laterality: N/A;   Family History:  Family History  Problem Relation Age of Onset   Diabetes Father    Diabetes Sister     Social History:  Social History   Substance and Sexual Activity  Alcohol Use Yes   Alcohol/week: 2.0 standard drinks of alcohol   Types: 2 Glasses of wine per week   Comment: Occ     Social History   Substance and Sexual Activity  Drug Use No    Social History   Socioeconomic History   Marital status: Married    Spouse name: Not on file   Number of children: Not on file   Years of education: Not on file   Highest education level: Not on file  Occupational History   Not on file  Tobacco Use   Smoking status: Never    Smokeless tobacco: Never  Substance and Sexual Activity   Alcohol use: Yes    Alcohol/week: 2.0 standard drinks of alcohol    Types: 2 Glasses of wine per week    Comment: Occ   Drug use: No   Sexual activity: Never  Other Topics Concern   Not on file  Social History Narrative   Not on file   Social Determinants of Health   Financial Resource Strain: Not on file  Food Insecurity: No Food Insecurity (02/26/2022)   Hunger Vital Sign    Worried About Running Out of Food in the Last Year: Never true    Ran Out of Food in the Last Year: Never true  Transportation Needs: No Transportation Needs (02/26/2022)   PRAPARE - Administrator, Civil Service (Medical): No    Lack of Transportation (Non-Medical): No  Physical Activity: Not on file  Stress: Not on file  Social Connections: Not on file   Additional Social History:    Pain Medications: See PTA Prescriptions: See PTA Over the Counter: See PTA History of alcohol / drug use?: No history of alcohol / drug abuse Longest period of sobriety (when/how long): Unable to quantify                    Sleep: Good  Appetite:  Good  Current Medications: Current Facility-Administered Medications  Medication Dose Route Frequency Provider Last Rate Last Admin   acetaminophen (TYLENOL) tablet 650 mg  650 mg Oral Q6H PRN Vanetta MuldersBarthold, Louise F, NP       alum & mag hydroxide-simeth (MAALOX/MYLANTA) 200-200-20 MG/5ML suspension 30 mL  30 mL Oral Q4H PRN Gabriel CirriBarthold, Louise F, NP       ARIPiprazole (ABILIFY) tablet 10 mg  10 mg Oral q morning Sarina IllHerrick, Hermenia Fritcher Edward, DO   10 mg at 03/07/22 0801   atorvastatin (LIPITOR) tablet 40 mg  40 mg Oral Daily Gabriel CirriBarthold, Louise F, NP   40 mg at 03/07/22 0801   carbamazepine (TEGRETOL) tablet 400 mg  400 mg Oral TID Sarina IllHerrick, Renelle Stegenga Edward, DO   400 mg at 03/07/22 0800   cholecalciferol (VITAMIN D3) 25 MCG (1000 UNIT) tablet 2,000 Units  2,000 Units Oral Daily Gabriel CirriBarthold, Louise F, NP   2,000  Units at 03/07/22 0800   folic acid (FOLVITE) tablet 0.5 mg  500 mcg Oral Daily Gabriel CirriBarthold, Louise F, NP   0.5 mg at 03/07/22 0801   glipiZIDE (GLUCOTROL) tablet 5 mg  5 mg Oral Q breakfast Gabriel CirriBarthold, Louise F, NP   5 mg at 03/07/22 0759   insulin aspart (novoLOG) injection 0-15 Units  0-15 Units Subcutaneous TID WC Gabriel CirriBarthold, Louise F, NP   5 Units at 03/07/22 1127   insulin aspart (novoLOG) injection 0-5 Units  0-5 Units Subcutaneous QHS Gabriel CirriBarthold, Louise F, NP   2 Units at 03/06/22 2145   insulin glargine-yfgn (SEMGLEE) injection 10 Units  10 Units Subcutaneous QHS Sarina IllHerrick, Taysia Rivere Edward, DO   10 Units at 03/06/22 2146   LORazepam (ATIVAN) tablet 1 mg  1 mg Oral Q6H PRN Sarina IllHerrick, Verenice Westrich Edward, DO       losartan (COZAAR) tablet 25 mg  25 mg Oral Daily Gabriel CirriBarthold, Louise F, NP   25 mg at 03/07/22 0801   magnesium hydroxide (MILK OF MAGNESIA) suspension 30 mL  30 mL Oral Daily PRN Gabriel CirriBarthold, Louise F, NP       metFORMIN (GLUCOPHAGE) tablet 1,000 mg  1,000 mg Oral BID WC Gabriel CirriBarthold, Louise F, NP   1,000 mg at 03/07/22 0759   traZODone (DESYREL) tablet 100 mg  100 mg Oral QHS Gabriel CirriBarthold, Louise F, NP   100 mg at 03/06/22 2147    Lab Results:  Results for orders placed or performed during the hospital encounter of 02/26/22 (from the past 48 hour(s))  Glucose, capillary     Status: Abnormal   Collection Time: 03/05/22  4:16 PM  Result Value Ref Range   Glucose-Capillary 279 (H) 70 - 99 mg/dL    Comment: Glucose reference range applies only to samples taken after fasting for at least 8 hours.  Glucose, capillary     Status: Abnormal   Collection Time: 03/05/22  7:28 PM  Result Value Ref Range   Glucose-Capillary 351 (H) 70 - 99 mg/dL    Comment: Glucose reference range applies only to samples taken after fasting for at least 8 hours.  Glucose, capillary     Status: Abnormal   Collection Time: 03/06/22  7:24 AM  Result Value Ref Range   Glucose-Capillary 204 (H) 70 - 99 mg/dL    Comment: Glucose  reference range applies only to samples taken after fasting for at least 8 hours.  Lipid panel     Status: Abnormal   Collection Time: 03/06/22  8:27 AM  Result Value Ref Range   Cholesterol 194 0 -  200 mg/dL   Triglycerides 103 <159 mg/dL   HDL 63 >45 mg/dL   Total CHOL/HDL Ratio 3.1 RATIO   VLDL 23 0 - 40 mg/dL   LDL Cholesterol 859 (H) 0 - 99 mg/dL    Comment:        Total Cholesterol/HDL:CHD Risk Coronary Heart Disease Risk Table                     Men   Women  1/2 Average Risk   3.4   3.3  Average Risk       5.0   4.4  2 X Average Risk   9.6   7.1  3 X Average Risk  23.4   11.0        Use the calculated Patient Ratio above and the CHD Risk Table to determine the patient's CHD Risk.        ATP III CLASSIFICATION (LDL):  <100     mg/dL   Optimal  292-446  mg/dL   Near or Above                    Optimal  130-159  mg/dL   Borderline  286-381  mg/dL   High  >771     mg/dL   Very High Performed at [facility], [address] [place], [place] [address]   Hemoglobin A1c     Status: Abnormal   Collection Time: 03/06/22  8:27 AM  Result Value Ref Range   Hgb A1c MFr Bld 8.3 (H) 4.8 - 5.6 %    Comment: (NOTE) Pre diabetes:          5.7%-6.4%  Diabetes:              >6.4%  Glycemic control for   <7.0% adults with diabetes    Mean Plasma Glucose 191.51 mg/dL    Comment: Performed at [facility], [address], [place], [place] [address]  Carbamazepine level, total     Status: None   Collection Time: 03/06/22  8:27 AM  Result Value Ref Range   Carbamazepine Lvl 4.5 4.0 - 12.0 ug/mL    Comment: Performed at [facility], [address] [place], [place] [address]  Comprehensive metabolic panel     Status: Abnormal   Collection Time: 03/06/22  8:27 AM  Result Value Ref Range   Sodium 135 135 - 145 mmol/L   Potassium 4.3 3.5 - 5.1 mmol/L   Chloride 102 98 - 111 mmol/L   CO2 22 22 - 32 mmol/L   Glucose, Bld 349 (H) 70 - 99 mg/dL     Comment: Glucose reference range applies only to samples taken after fasting for at least 8 hours.   BUN 12 8 - 23 mg/dL   Creatinine, Ser 1.91 0.44 - 1.00 mg/dL   Calcium 9.5 8.9 - 66.0 mg/dL   Total Protein 6.4 (L) 6.5 - 8.1 g/dL   Albumin 3.9 3.5 - 5.0 g/dL   AST 22 15 - 41 U/L   ALT 19 0 - 44 U/L   Alkaline Phosphatase 84 38 - 126 U/L   Total Bilirubin 0.6 0.3 - 1.2 mg/dL   GFR, Estimated >60 >04 mL/min    Comment: (NOTE) Calculated using the CKD-EPI Creatinine Equation (2021)    Anion gap 11 5 - 15    Comment: Performed at [facility], [address], [place], [place] [address]  Glucose, capillary  Status: Abnormal   Collection Time: 03/06/22 11:24 AM  Result Value Ref Range   Glucose-Capillary 192 (H) 70 - 99 mg/dL    Comment: Glucose reference range applies only to samples taken after fasting for at least 8 hours.  Glucose, capillary     Status: Abnormal   Collection Time: 03/06/22  4:41 PM  Result Value Ref Range   Glucose-Capillary 209 (H) 70 - 99 mg/dL    Comment: Glucose reference range applies only to samples taken after fasting for at least 8 hours.  Glucose, capillary     Status: Abnormal   Collection Time: 03/06/22  8:10 PM  Result Value Ref Range   Glucose-Capillary 285 (H) 70 - 99 mg/dL    Comment: Glucose reference range applies only to samples taken after fasting for at least 8 hours.  Glucose, capillary     Status: Abnormal   Collection Time: 03/07/22  7:54 AM  Result Value Ref Range   Glucose-Capillary 221 (H) 70 - 99 mg/dL    Comment: Glucose reference range applies only to samples taken after fasting for at least 8 hours.  Glucose, capillary     Status: Abnormal   Collection Time: 03/07/22 11:23 AM  Result Value Ref Range   Glucose-Capillary 207 (H) 70 - 99 mg/dL    Comment: Glucose reference range applies only to samples taken after fasting for at least 8 hours.    Blood Alcohol level:  Lab Results  Component Value Date   ETH <10  02/26/2022   ETH <10 10/18/2021    Metabolic Disorder Labs: Lab Results  Component Value Date   HGBA1C 8.3 (H) 03/06/2022   MPG 191.51 03/06/2022   MPG 134.11 02/20/2017   Lab Results  Component Value Date   PROLACTIN 23.3 02/20/2017   PROLACTIN 3.6 (L) 07/31/2016   Lab Results  Component Value Date   CHOL 194 03/06/2022   TRIG 113 03/06/2022   HDL 63 03/06/2022   CHOLHDL 3.1 03/06/2022   VLDL 23 03/06/2022   LDLCALC 108 (H) 03/06/2022   LDLCALC 131 (H) 02/20/2017    Physical Findings: AIMS:  , ,  ,  ,    CIWA:    COWS:     Musculoskeletal: Strength & Muscle Tone: within normal limits Gait & Station: normal Patient leans: N/A  Psychiatric Specialty Exam:  Presentation  General Appearance:  Casual  Eye Contact: Fair  Speech: Clear and Coherent  Speech Volume: Normal  Handedness: Right   Mood and Affect  Mood: Anxious  Affect: Inappropriate   Thought Process  Thought Processes: Disorganized (Mild)  Descriptions of Associations:Intact  Orientation:Full (Time, Place and Person)  Thought Content:Delusions  History of Schizophrenia/Schizoaffective disorder:No  Duration of Psychotic Symptoms:Greater than six months  Hallucinations:No data recorded Ideas of Reference:None  Suicidal Thoughts:No data recorded Homicidal Thoughts:No data recorded  Sensorium  Memory: Immediate Fair  Judgment: Impaired  Insight: Good   Executive Functions  Concentration: Fair  Attention Span: Fair  Recall: [employer] of Knowledge: Fair  Language: Fair   Psychomotor Activity  Psychomotor Activity:No data recorded  Assets  Assets: Resilience; Social Support; Health and safety inspector; Housing; Desire for Improvement   Sleep  Sleep:No data recorded    Blood pressure 131/78, pulse (!) 124, temperature 98 F (36.7 C), temperature source Oral, resp. rate 18, height 5\' 2"  (1.575 m), weight 76.4 kg, SpO2 100 %. Body mass index  is 30.82 kg/m.   Treatment Plan Summary: Daily contact with patient to assess and evaluate  symptoms and progress in treatment, Medication management, and Plan continue current medications.  Parks Ranger, DO 03/07/2022, 12:01 PM

## 2022-03-07 NOTE — Progress Notes (Addendum)
Patient denies SI, HI, and AVH. She is pleasant and cooperative with assessment. Patient is standing up throughout assessment and while eating breakfast. When asked if she wanted to sit down, patient states that she prefers to stand. Patient ate breakfast in the dayroom and returned to her room. Patient is compliant with scheduled medications. She remains safe on the unit at this time.  At lunchtime, patient stated, "I don't think I need my insulin". Patient was informed her CBG was 209. Patient was agreeable to take the insulin. However, she told MHT, "I don't want a diet soda, I want a regular one". Patient given diet soda and ate lunch standing up.

## 2022-03-08 DIAGNOSIS — F250 Schizoaffective disorder, bipolar type: Secondary | ICD-10-CM | POA: Diagnosis not present

## 2022-03-08 LAB — GLUCOSE, CAPILLARY
Glucose-Capillary: 172 mg/dL — ABNORMAL HIGH (ref 70–99)
Glucose-Capillary: 298 mg/dL — ABNORMAL HIGH (ref 70–99)
Glucose-Capillary: 262 mg/dL — ABNORMAL HIGH (ref 70–99)
Glucose-Capillary: 234 mg/dL — ABNORMAL HIGH (ref 70–99)

## 2022-03-08 NOTE — Plan of Care (Signed)

## 2022-03-08 NOTE — Progress Notes (Signed)
[facility] MD Progress Note  03/08/2022 11:40 AM Rita Sullivan  MRN:  [id number] Subjective: Rita Sullivan is seen on rounds.  I increased her Depakote because her level was low.  She states that it is doing fine.  She denies any side effects from her medication.  She is pleasant and cooperative and compliant.  No complaints.  Principal Problem: Schizoaffective disorder, bipolar type (HCC) Diagnosis: Principal Problem:   Schizoaffective disorder, bipolar type (HCC)  Total Time spent with patient: 15 minutes  Past Psychiatric History: She has numerous past psychiatric admissions for psychosis and mania.  She has been on Geodon.  She tends to travel to distant destinations for no reason.  She has followed up at [facility] and a past history of psychiatry in [place].  Currently att[facility].  Past Medical History:  Past Medical History:  Diagnosis Date   Bipolar disorder (HCC)    Chronic kidney disease    Diabetes mellitus without complication (HCC)    Hypercholesteremia    Manic behavior (HCC)     Past Surgical History:  Procedure Laterality Date   COLONOSCOPY WITH PROPOFOL N/A 03/11/2015   Procedure: COLONOSCOPY WITH PROPOFOL;  Surgeon: Wallace Cullens, MD;  Location: [facility] ENDOSCOPY;  Service: Gastroenterology;  Laterality: N/A;   Family History:  Family History  Problem Relation Age of Onset   Diabetes Father    Diabetes Sister     Social History:  Social History   Substance and Sexual Activity  Alcohol Use Yes   Alcohol/week: 2.0 standard drinks of alcohol   Types: 2 Glasses of wine per week   Comment: Occ     Social History   Substance and Sexual Activity  Drug Use No    Social History   Socioeconomic History   Marital status: Married    Spouse name: Not on file   Number of children: Not on file   Years of education: Not on file   Highest education level: Not on file  Occupational History   Not on file  Tobacco Use   Smoking status: Never   Smokeless tobacco:  Never  Substance and Sexual Activity   Alcohol use: Yes    Alcohol/week: 2.0 standard drinks of alcohol    Types: 2 Glasses of wine per week    Comment: Occ   Drug use: No   Sexual activity: Never  Other Topics Concern   Not on file  Social History Narrative   Not on file   Social Determinants of Health   Financial Resource Strain: Not on file  Food Insecurity: No Food Insecurity (02/26/2022)   Hunger Vital Sign    Worried About Running Out of Food in the Last Year: Never true    Ran Out of Food in the Last Year: Never true  Transportation Needs: No Transportation Needs (02/26/2022)   PRAPARE - Administrator, Civil Service (Medical): No    Lack of Transportation (Non-Medical): No  Physical Activity: Not on file  Stress: Not on file  Social Connections: Not on file   Additional Social History:    Pain Medications: See PTA Prescriptions: See PTA Over the Counter: See PTA History of alcohol / drug use?: No history of alcohol / drug abuse Longest period of sobriety (when/how long): Unable to quantify                    Sleep: Good  Appetite:  Good  Current Medications: Current Facility-Administered Medications  Medication Dose Route  Frequency Provider Last Rate Last Admin   acetaminophen (TYLENOL) tablet 650 mg  650 mg Oral Q6H PRN Sherlon Handing, NP       alum & mag hydroxide-simeth (MAALOX/MYLANTA) 200-200-20 MG/5ML suspension 30 mL  30 mL Oral Q4H PRN Waldon Merl F, NP       ARIPiprazole (ABILIFY) tablet 10 mg  10 mg Oral q morning Parks Ranger, DO   10 mg at 03/08/22 8250   atorvastatin (LIPITOR) tablet 40 mg  40 mg Oral Daily Waldon Merl F, NP   40 mg at 03/08/22 0940   carbamazepine (TEGRETOL) tablet 400 mg  400 mg Oral TID Parks Ranger, DO   400 mg at 03/08/22 0940   cholecalciferol (VITAMIN D3) 25 MCG (1000 UNIT) tablet 2,000 Units  2,000 Units Oral Daily Waldon Merl F, NP   2,000 Units at 53/97/67 3419    folic acid (FOLVITE) tablet 0.5 mg  500 mcg Oral Daily Waldon Merl F, NP   0.5 mg at 03/08/22 0939   glipiZIDE (GLUCOTROL) tablet 5 mg  5 mg Oral Q breakfast Waldon Merl F, NP   5 mg at 03/08/22 0813   insulin aspart (novoLOG) injection 0-15 Units  0-15 Units Subcutaneous TID WC Sherlon Handing, NP   3 Units at 03/08/22 0813   insulin aspart (novoLOG) injection 0-5 Units  0-5 Units Subcutaneous QHS Waldon Merl F, NP   2 Units at 03/07/22 2139   insulin glargine-yfgn (SEMGLEE) injection 10 Units  10 Units Subcutaneous QHS Parks Ranger, DO   10 Units at 03/07/22 2141   LORazepam (ATIVAN) tablet 1 mg  1 mg Oral Q6H PRN Parks Ranger, DO       losartan (COZAAR) tablet 25 mg  25 mg Oral Daily Waldon Merl F, NP   25 mg at 03/08/22 3790   magnesium hydroxide (MILK OF MAGNESIA) suspension 30 mL  30 mL Oral Daily PRN Waldon Merl F, NP       metFORMIN (GLUCOPHAGE) tablet 1,000 mg  1,000 mg Oral BID WC Waldon Merl F, NP   1,000 mg at 03/08/22 0813   traZODone (DESYREL) tablet 100 mg  100 mg Oral QHS Waldon Merl F, NP   100 mg at 03/07/22 2142    Lab Results:  Results for orders placed or performed during the hospital encounter of 02/26/22 (from the past 48 hour(s))  Glucose, capillary     Status: Abnormal   Collection Time: 03/06/22  4:41 PM  Result Value Ref Range   Glucose-Capillary 209 (H) 70 - 99 mg/dL    Comment: Glucose reference range applies only to samples taken after fasting for at least 8 hours.  Glucose, capillary     Status: Abnormal   Collection Time: 03/06/22  8:10 PM  Result Value Ref Range   Glucose-Capillary 285 (H) 70 - 99 mg/dL    Comment: Glucose reference range applies only to samples taken after fasting for at least 8 hours.  Glucose, capillary     Status: Abnormal   Collection Time: 03/07/22  7:54 AM  Result Value Ref Range   Glucose-Capillary 221 (H) 70 - 99 mg/dL    Comment: Glucose reference range applies only to  samples taken after fasting for at least 8 hours.  Glucose, capillary     Status: Abnormal   Collection Time: 03/07/22 11:23 AM  Result Value Ref Range   Glucose-Capillary 207 (H) 70 - 99 mg/dL    Comment: Glucose reference range applies  only to samples taken after fasting for at least 8 hours.  Glucose, capillary     Status: Abnormal   Collection Time: 03/07/22  4:04 PM  Result Value Ref Range   Glucose-Capillary 131 (H) 70 - 99 mg/dL    Comment: Glucose reference range applies only to samples taken after fasting for at least 8 hours.   Comment 1 Notify RN   Glucose, capillary     Status: Abnormal   Collection Time: 03/07/22  8:03 PM  Result Value Ref Range   Glucose-Capillary 209 (H) 70 - 99 mg/dL    Comment: Glucose reference range applies only to samples taken after fasting for at least 8 hours.   Comment 1 Notify RN   Glucose, capillary     Status: Abnormal   Collection Time: 03/08/22  7:51 AM  Result Value Ref Range   Glucose-Capillary 172 (H) 70 - 99 mg/dL    Comment: Glucose reference range applies only to samples taken after fasting for at least 8 hours.  Glucose, capillary     Status: Abnormal   Collection Time: 03/08/22 11:34 AM  Result Value Ref Range   Glucose-Capillary 298 (H) 70 - 99 mg/dL    Comment: Glucose reference range applies only to samples taken after fasting for at least 8 hours.    Blood Alcohol level:  Lab Results  Component Value Date   ETH <10 02/26/2022   ETH <10 10/18/2021    Metabolic Disorder Labs: Lab Results  Component Value Date   HGBA1C 8.3 (H) 03/06/2022   MPG 191.51 03/06/2022   MPG 134.11 02/20/2017   Lab Results  Component Value Date   PROLACTIN 23.3 02/20/2017   PROLACTIN 3.6 (L) 07/31/2016   Lab Results  Component Value Date   CHOL 194 03/06/2022   TRIG 113 03/06/2022   HDL 63 03/06/2022   CHOLHDL 3.1 03/06/2022   VLDL 23 03/06/2022   LDLCALC 108 (H) 03/06/2022   LDLCALC 131 (H) 02/20/2017    Physical  Findings: AIMS:  , ,  ,  ,    CIWA:    COWS:     Musculoskeletal: Strength & Muscle Tone: within normal limits Gait & Station: normal Patient leans: N/A  Psychiatric Specialty Exam:  Presentation  General Appearance:  Casual  Eye Contact: Fair  Speech: Clear and Coherent  Speech Volume: Normal  Handedness: Right   Mood and Affect  Mood: Anxious  Affect: Inappropriate   Thought Process  Thought Processes: Disorganized (Mild)  Descriptions of Associations:Intact  Orientation:Full (Time, Place and Person)  Thought Content:Delusions  History of Schizophrenia/Schizoaffective disorder:No  Duration of Psychotic Symptoms:Greater than six months  Hallucinations:No data recorded Ideas of Reference:None  Suicidal Thoughts:No data recorded Homicidal Thoughts:No data recorded  Sensorium  Memory: Immediate Fair  Judgment: Impaired  Insight: Good   Executive Functions  Concentration: Fair  Attention Span: Fair  Recall: [employer] of Knowledge: Fair  Language: Fair   Psychomotor Activity  Psychomotor Activity:No data recorded  Assets  Assets: Resilience; Social Support; Health and safety inspector; Housing; Desire for Improvement   Sleep  Sleep:No data recorded    Blood pressure 120/88, pulse 99, temperature 97.8 F (36.6 C), resp. rate 16, height 5\' 2"  (1.575 m), weight 76.4 kg, SpO2 99 %. Body mass index is 30.82 kg/m.   Treatment Plan Summary: Daily contact with patient to assess and evaluate symptoms and progress in treatment, Medication management, and Plan continue current medications.  , DO 03/08/2022, 11:40 AM

## 2022-03-08 NOTE — Progress Notes (Signed)
Patient attended music therapy and actively engaged in group discussion on how music can be used as a Technical sales engineer. Patient requested the song "girls just want to have fun" and was seen dancing and singing along.

## 2022-03-08 NOTE — Progress Notes (Signed)
Patient is alert and oriented. Vital signs are stable. Denies SI/HI/AVH. Compliant to medications. Cooperative. Patient is lethargic. Denies pain. No other issues. Patient went to bed after night medications. Patient is on bed with eyes closed. Patient slept most of the shift with eyes closed. Q15 minutes checks are maintained. We will continue to monitor.

## 2022-03-08 NOTE — Progress Notes (Signed)
Patient is A+O x 3. Disoriented to situation. She denies SI/HI/AVH, anxiety and depression. Patient agrees to contract for safety. Pain 0/10. Appetite good. Exhibits medication and CBG compliance but only while standing up straight. Patient states that any type of assessment performed on her is more accurate while erect. Patient was even noted to be consuming food while standing. Q15 minute checks in place.

## 2022-03-09 LAB — GLUCOSE, CAPILLARY
Glucose-Capillary: 175 mg/dL — ABNORMAL HIGH (ref 70–99)
Glucose-Capillary: 233 mg/dL — ABNORMAL HIGH (ref 70–99)
Glucose-Capillary: 231 mg/dL — ABNORMAL HIGH (ref 70–99)
Glucose-Capillary: 224 mg/dL — ABNORMAL HIGH (ref 70–99)

## 2022-03-09 NOTE — Inpatient Diabetes Management (Signed)
Inpatient Diabetes Program Recommendations  AACE/ADA: New Consensus Statement on Inpatient Glycemic Control   Target Ranges:  Prepandial:   less than 140 mg/dL      Peak postprandial:   less than 180 mg/dL (1-2 hours)      Critically ill patients:  140 - 180 mg/dL    Latest Reference Range & Units 03/08/22 07:51 03/08/22 11:34 03/08/22 16:18 03/08/22 19:33 03/09/22 07:36  Glucose-Capillary 70 - 99 mg/dL 172 (H) 298 (H) 262 (H) 234 (H) 175 (H)   Review of Glycemic Control  Diabetes history: DM2 Outpatient Diabetes medications: Metformin 1000 mg BID, Jardiance 10 mg daily, Glipizide 5 mg daily Current orders for Inpatient glycemic control: Semglee 10 units QHS, Novolog 0-15 units TID with meals, Novolog 0-5 units QHS, Glipizide 5 mg QAM, Metformin 1000 mg BID  Inpatient Diabetes Program Recommendations:    Insulin: Please consider ordering Novolog 4 units TID with meals for meal coverage if patient eats at least 50% of meals.  Thanks, Barnie Alderman, RN, MSN, [place] Diabetes Coordinator Inpatient Diabetes Program [phone] (Team Pager from 8am to [place])

## 2022-03-09 NOTE — Progress Notes (Addendum)
Patient presents with flat affect but cooperative and med compliant. Patient ate breakfast and lunch so far. Patient joined in dayroom for snacks and music. Patient denies SI,HI, and A/V/H. Educated on diabetic management. Patient appears calmed and appeared to enjoy listening to music in dayroom with other peers.

## 2022-03-09 NOTE — Group Note (Signed)
[facility] LCSW Group Therapy Note    Group Date: 03/09/2022 Start Time: 1300 End Time: 1400  Type of Therapy and Topic:  Group Therapy:  Overcoming Obstacles  Participation Level:  [facility] PARTICIPATION LEVEL: Did Not Attend  Mood:  Description of Group:   In this group patients will be encouraged to explore what they see as obstacles to their own wellness and recovery. They will be guided to discuss their thoughts, feelings, and behaviors related to these obstacles. The group will process together ways to cope with barriers, with attention given to specific choices patients can make. Each patient will be challenged to identify changes they are motivated to make in order to overcome their obstacles. This group will be process-oriented, with patients participating in exploration of their own experiences as well as giving and receiving support and challenge from other group members.  Therapeutic Goals: 1. Patient will identify personal and current obstacles as they relate to admission. 2. Patient will identify barriers that currently interfere with their wellness or overcoming obstacles.  3. Patient will identify feelings, thought process and behaviors related to these barriers. 4. Patient will identify two changes they are willing to make to overcome these obstacles:    Summary of Patient Progress   X   Therapeutic Modalities:   Cognitive Behavioral Therapy Solution Focused Therapy Motivational Interviewing Relapse Prevention Therapy   Burman Bruington J Shebra Muldrow, LCSW 

## 2022-03-09 NOTE — Progress Notes (Addendum)
[facility] MD Progress Note  03/09/2022 1:47 PM Rita Sullivan  MRN:  [id number] Subjective: "I think I am doing a lot better."  Patient is approaching her room where she is lying down.  She sits up quickly.  She looks a little distracted but is cooperative.  She denies any auditory or visual hallucinations.  Denies suicidal ideations.  Her affect is a bit odd.  She says she is not depressed and states that her mood is "good."  Denies any problems with her medications.  Denies any physical symptoms and states that she is tired now and wants to take a nap.  Principal Problem: Schizoaffective disorder, bipolar type ([place]) Diagnosis: Principal Problem:   Schizoaffective disorder, bipolar type ([place])  Total Time spent with patient: 15 minutes  Past Psychiatric History: Per Dr. Carlton Adam note of 03/07/2022: "She has numerous past psychiatric admissions for psychosis and mania.  She has been on Geodon.  She tends to travel to distant destinations for no reason.  She has followed up at [facility] and a past history of psychiatry in [place].  Currently attends [facility] [employer]."  Past Medical History:  Past Medical History:  Diagnosis Date   Bipolar disorder ([place])    Chronic kidney disease    Diabetes mellitus without complication ([place])    Hypercholesteremia    Manic behavior ([place])     Past Surgical History:  Procedure Laterality Date   COLONOSCOPY WITH PROPOFOL N/A 03/11/2015   Procedure: COLONOSCOPY WITH PROPOFOL;  Surgeon: Hulen Luster, MD;  Location: [facility] ENDOSCOPY;  Service: Gastroenterology;  Laterality: N/A;   Family History:  Family History  Problem Relation Age of Onset   Diabetes Father    Diabetes Sister    Family Psychiatric  History:  Social History:  Social History   Substance and Sexual Activity  Alcohol Use Yes   Alcohol/week: 2.0 standard drinks of alcohol   Types: 2 Glasses of wine per week   Comment: Occ     Social History   Substance and Sexual Activity  Drug Use No     Social History   Socioeconomic History   Marital status: Married    Spouse name: Not on file   Number of children: Not on file   Years of education: Not on file   Highest education level: Not on file  Occupational History   Not on file  Tobacco Use   Smoking status: Never   Smokeless tobacco: Never  Substance and Sexual Activity   Alcohol use: Yes    Alcohol/week: 2.0 standard drinks of alcohol    Types: 2 Glasses of wine per week    Comment: Occ   Drug use: No   Sexual activity: Never  Other Topics Concern   Not on file  Social History Narrative   Not on file   Social Determinants of Health   Financial Resource Strain: Not on file  Food Insecurity: No Food Insecurity (02/26/2022)   Hunger Vital Sign    Worried About Running Out of Food in the Last Year: Never true    Ran Out of Food in the Last Year: Never true  Transportation Needs: No Transportation Needs (02/26/2022)   PRAPARE - Hydrologist (Medical): No    Lack of Transportation (Non-Medical): No  Physical Activity: Not on file  Stress: Not on file  Social Connections: Not on file   Additional Social History:    Pain Medications: See PTA Prescriptions: See PTA Over the Counter: See  PTA History of alcohol / drug use?: No history of alcohol / drug abuse Longest period of sobriety (when/how long): Unable to quantify   Sleep:  Not indicated on chart  Appetite:  Fair  Current Medications: Current Facility-Administered Medications  Medication Dose Route Frequency Provider Last Rate Last Admin   acetaminophen (TYLENOL) tablet 650 mg  650 mg Oral Q6H PRN Vanetta Mulders, NP       alum & mag hydroxide-simeth (MAALOX/MYLANTA) 200-200-20 MG/5ML suspension 30 mL  30 mL Oral Q4H PRN Gabriel Cirri F, NP       ARIPiprazole (ABILIFY) tablet 10 mg  10 mg Oral q morning Sarina Ill, DO   10 mg at 03/09/22 0935   atorvastatin (LIPITOR) tablet 40 mg  40 mg Oral Daily Gabriel Cirri F, NP   40 mg at 03/09/22 0935   carbamazepine (TEGRETOL) tablet 400 mg  400 mg Oral TID Sarina Ill, DO   400 mg at 03/09/22 0934   cholecalciferol (VITAMIN D3) 25 MCG (1000 UNIT) tablet 2,000 Units  2,000 Units Oral Daily Gabriel Cirri F, NP   2,000 Units at 03/09/22 0934   folic acid (FOLVITE) tablet 0.5 mg  500 mcg Oral Daily Gabriel Cirri F, NP   0.5 mg at 03/09/22 0934   glipiZIDE (GLUCOTROL) tablet 5 mg  5 mg Oral Q breakfast Gabriel Cirri F, NP   5 mg at 03/09/22 0746   insulin aspart (novoLOG) injection 0-15 Units  0-15 Units Subcutaneous TID WC Gabriel Cirri F, NP   5 Units at 03/09/22 1146   insulin aspart (novoLOG) injection 0-5 Units  0-5 Units Subcutaneous QHS Gabriel Cirri F, NP   2 Units at 03/08/22 2132   insulin glargine-yfgn (SEMGLEE) injection 10 Units  10 Units Subcutaneous QHS Sarina Ill, DO   10 Units at 03/08/22 2134   LORazepam (ATIVAN) tablet 1 mg  1 mg Oral Q6H PRN Sarina Ill, DO       losartan (COZAAR) tablet 25 mg  25 mg Oral Daily Gabriel Cirri F, NP   25 mg at 03/09/22 0934   magnesium hydroxide (MILK OF MAGNESIA) suspension 30 mL  30 mL Oral Daily PRN Gabriel Cirri F, NP       metFORMIN (GLUCOPHAGE) tablet 1,000 mg  1,000 mg Oral BID WC Gabriel Cirri F, NP   1,000 mg at 03/09/22 0746   traZODone (DESYREL) tablet 100 mg  100 mg Oral QHS Gabriel Cirri F, NP   100 mg at 03/08/22 2131    Lab Results:  Results for orders placed or performed during the hospital encounter of 02/26/22 (from the past 48 hour(s))  Glucose, capillary     Status: Abnormal   Collection Time: 03/07/22  4:04 PM  Result Value Ref Range   Glucose-Capillary 131 (H) 70 - 99 mg/dL    Comment: Glucose reference range applies only to samples taken after fasting for at least 8 hours.   Comment 1 Notify RN   Glucose, capillary     Status: Abnormal   Collection Time: 03/07/22  8:03 PM  Result Value Ref Range   Glucose-Capillary  209 (H) 70 - 99 mg/dL    Comment: Glucose reference range applies only to samples taken after fasting for at least 8 hours.   Comment 1 Notify RN   Glucose, capillary     Status: Abnormal   Collection Time: 03/08/22  7:51 AM  Result Value Ref Range   Glucose-Capillary 172 (H) 70 -  99 mg/dL    Comment: Glucose reference range applies only to samples taken after fasting for at least 8 hours.  Glucose, capillary     Status: Abnormal   Collection Time: 03/08/22 11:34 AM  Result Value Ref Range   Glucose-Capillary 298 (H) 70 - 99 mg/dL    Comment: Glucose reference range applies only to samples taken after fasting for at least 8 hours.  Glucose, capillary     Status: Abnormal   Collection Time: 03/08/22  4:18 PM  Result Value Ref Range   Glucose-Capillary 262 (H) 70 - 99 mg/dL    Comment: Glucose reference range applies only to samples taken after fasting for at least 8 hours.  Glucose, capillary     Status: Abnormal   Collection Time: 03/08/22  7:33 PM  Result Value Ref Range   Glucose-Capillary 234 (H) 70 - 99 mg/dL    Comment: Glucose reference range applies only to samples taken after fasting for at least 8 hours.  Glucose, capillary     Status: Abnormal   Collection Time: 03/09/22  7:36 AM  Result Value Ref Range   Glucose-Capillary 175 (H) 70 - 99 mg/dL    Comment: Glucose reference range applies only to samples taken after fasting for at least 8 hours.  Glucose, capillary     Status: Abnormal   Collection Time: 03/09/22 11:32 AM  Result Value Ref Range   Glucose-Capillary 233 (H) 70 - 99 mg/dL    Comment: Glucose reference range applies only to samples taken after fasting for at least 8 hours.    Blood Alcohol level:  Lab Results  Component Value Date   ETH <10 02/26/2022   ETH <10 10/18/2021    Metabolic Disorder Labs: Lab Results  Component Value Date   HGBA1C 8.3 (H) 03/06/2022   MPG 191.51 03/06/2022   MPG 134.11 02/20/2017   Lab Results  Component Value Date    PROLACTIN 23.3 02/20/2017   PROLACTIN 3.6 (L) 07/31/2016   Lab Results  Component Value Date   CHOL 194 03/06/2022   TRIG 113 03/06/2022   HDL 63 03/06/2022   CHOLHDL 3.1 03/06/2022   VLDL 23 03/06/2022   LDLCALC 108 (H) 03/06/2022   LDLCALC 131 (H) 02/20/2017    Physical Findings: AIMS:  , ,  ,  ,    CIWA:    COWS:     Musculoskeletal: Strength & Muscle Tone: WNL Gait & Station:  Did not observe Patient leans: N/A  Psychiatric Specialty Exam:  Presentation  General Appearance:  Casual  Eye Contact: Fair  Speech: Clear and Coherent  Speech Volume: Normal  Handedness: Right   Mood and Affect  Mood: Anxious  Affect: Inappropriate   Thought Process  Thought Processes: Disorganized (Mild)  Descriptions of Associations:Intact  Orientation:Full (Time, Place and Person)  Thought Content:Delusions  History of Schizophrenia/Schizoaffective disorder:No  Duration of Psychotic Symptoms:Greater than six months  Hallucinations:No data recorded Ideas of Reference:None  Suicidal Thoughts:No data recorded Homicidal Thoughts:No data recorded  Sensorium  Memory: Immediate Fair  Judgment: Impaired  Insight: Good   Executive Functions  Concentration: Fair  Attention Span: Fair  Recall: [employer] of Knowledge: Fair  Language: Fair   Psychomotor Activity  Psychomotor Activity:No data recorded  Assets  Assets: Resilience; Social Support; Health and safety inspector; Housing; Desire for Improvement   Sleep  Sleep:No data recorded   Physical Exam: Physical Exam Vitals and nursing note reviewed.  HENT:     Head: Normocephalic.  Nose: No congestion or rhinorrhea.  Eyes:     General:        Right eye: No discharge.        Left eye: No discharge.  Cardiovascular:     Rate and Rhythm: Normal rate.  Pulmonary:     Effort: Pulmonary effort is normal.  Musculoskeletal:        General: Normal range of motion.      Cervical back: Normal range of motion.  Skin:    General: Skin is dry.  Neurological:     Mental Status: She is alert and oriented to person, place, and time.  Psychiatric:        Attention and Perception: Attention normal.        Mood and Affect: Affect is inappropriate (odd).        Speech: Speech normal.        Behavior: Behavior is cooperative.        Thought Content: Thought content is not paranoid. Thought content does not include homicidal or suicidal ideation.        Cognition and Memory: Cognition normal.        Judgment: Judgment normal.    Review of Systems  Constitutional: Negative.   HENT: Negative.    Eyes: Negative.   Respiratory: Negative.    Musculoskeletal: Negative.   Psychiatric/Behavioral:         Schizoaffective disorder bipolar type   Blood pressure 114/87, pulse 88, temperature 98.3 F (36.8 C), resp. rate 16, height 5\' 2"  (1.575 m), weight 76.4 kg, SpO2 99 %. Body mass index is 30.82 kg/m.   Treatment Plan Summary: Daily contact with patient to assess and evaluate symptoms and progress in treatment and Medication management  , NP 03/09/2022, 1:47 PM

## 2022-03-09 NOTE — Progress Notes (Signed)
Patient is alert and oriented. Calm and cooperative. Communicates well with staff and peers. Stays in room mostly.  Vital signs are stable. Denies SI/HI/AVH. Compliant to medications. Patient is lethargic. Denies pain. No other issues. Patient went to bed after night medications. Patient is on bed with eyes closed. Patient slept most of the shift with eyes closed. Q15 minutes checks are maintained. We will continue to monitor

## 2022-03-09 NOTE — BH IP Treatment Plan (Addendum)
Interdisciplinary Treatment and Diagnostic Plan Update  03/09/2022 Time of Session: 8:50AM Kersten Salmons MRN: [id number]  Principal Diagnosis: Schizoaffective disorder, bipolar type [facility])  Secondary Diagnoses: Principal Problem:   Schizoaffective disorder, bipolar type (HCC)   Current Medications:  Current Facility-Administered Medications  Medication Dose Route Frequency Provider Last Rate Last Admin   acetaminophen (TYLENOL) tablet 650 mg  650 mg Oral Q6H PRN Vanetta Mulders, NP       alum & mag hydroxide-simeth (MAALOX/MYLANTA) 200-200-20 MG/5ML suspension 30 mL  30 mL Oral Q4H PRN Gabriel Cirri F, NP       ARIPiprazole (ABILIFY) tablet 10 mg  10 mg Oral q morning Sarina Ill, DO   10 mg at 03/08/22 0939   atorvastatin (LIPITOR) tablet 40 mg  40 mg Oral Daily Gabriel Cirri F, NP   40 mg at 03/08/22 0940   carbamazepine (TEGRETOL) tablet 400 mg  400 mg Oral TID Sarina Ill, DO   400 mg at 03/08/22 2131   cholecalciferol (VITAMIN D3) 25 MCG (1000 UNIT) tablet 2,000 Units  2,000 Units Oral Daily Gabriel Cirri F, NP   2,000 Units at 03/08/22 0300   folic acid (FOLVITE) tablet 0.5 mg  500 mcg Oral Daily Gabriel Cirri F, NP   0.5 mg at 03/08/22 0939   glipiZIDE (GLUCOTROL) tablet 5 mg  5 mg Oral Q breakfast Gabriel Cirri F, NP   5 mg at 03/09/22 0746   insulin aspart (novoLOG) injection 0-15 Units  0-15 Units Subcutaneous TID WC Vanetta Mulders, NP   3 Units at 03/09/22 0743   insulin aspart (novoLOG) injection 0-5 Units  0-5 Units Subcutaneous QHS Gabriel Cirri F, NP   2 Units at 03/08/22 2132   insulin glargine-yfgn (SEMGLEE) injection 10 Units  10 Units Subcutaneous QHS Sarina Ill, DO   10 Units at 03/08/22 2134   LORazepam (ATIVAN) tablet 1 mg  1 mg Oral Q6H PRN Sarina Ill, DO       losartan (COZAAR) tablet 25 mg  25 mg Oral Daily Gabriel Cirri F, NP   25 mg at 03/08/22 9233   magnesium hydroxide (MILK OF  MAGNESIA) suspension 30 mL  30 mL Oral Daily PRN Gabriel Cirri F, NP       metFORMIN (GLUCOPHAGE) tablet 1,000 mg  1,000 mg Oral BID WC Gabriel Cirri F, NP   1,000 mg at 03/09/22 0746   traZODone (DESYREL) tablet 100 mg  100 mg Oral QHS Gabriel Cirri F, NP   100 mg at 03/08/22 2131   PTA Medications: Medications Prior to Admission  Medication Sig Dispense Refill Last Dose   ARIPiprazole (ABILIFY) 10 MG tablet Take 5 mg by mouth every morning.      atorvastatin (LIPITOR) 40 MG tablet Take 40 mg by mouth daily.      carbamazepine (TEGRETOL) 200 MG tablet Take 2 tablets (400 mg total) by mouth 2 (two) times daily after a meal. 120 tablet 0    cholecalciferol (VITAMIN D3) 25 MCG (1000 UNIT) tablet Take 2,000 Units by mouth daily.      folic acid (FOLVITE) 400 MCG tablet Take 400 mcg by mouth daily.      glipiZIDE (GLUCOTROL) 5 MG tablet Take 5 mg by mouth every morning.      JARDIANCE 10 MG TABS tablet Take 10 mg by mouth daily.      losartan (COZAAR) 25 MG tablet Take 25 mg by mouth daily.      metFORMIN (GLUCOPHAGE)  500 MG tablet Take 1 tablet (500 mg total) by mouth 2 (two) times daily with a meal. (Patient taking differently: Take 1,000 mg by mouth 2 (two) times daily with a meal.) 60 tablet 0    Multiple Vitamins-Minerals (HAIR SKIN AND NAILS FORMULA) TABS Take 1 tablet by mouth daily.      traZODone (DESYREL) 100 MG tablet Take 1 tablet (100 mg total) by mouth at bedtime as needed for sleep. (Patient taking differently: Take 100 mg by mouth at bedtime.) 30 tablet 0    zinc sulfate 220 (50 Zn) MG capsule Take 220 mg by mouth daily.       Patient Stressors: Other: Pt states she has AH's telling her to continually go to the store.    Patient Strengths: Active sense of humor  Supportive family/friends   Treatment Modalities: Medication Management, Group therapy, Case management,  1 to 1 session with clinician, Psychoeducation, Recreational therapy.   Physician Treatment Plan for  Primary Diagnosis: Schizoaffective disorder, bipolar type (HCC) Long Term Goal(s): Improvement in symptoms so as ready for discharge   Short Term Goals: Ability to identify changes in lifestyle to reduce recurrence of condition will improve Ability to verbalize feelings will improve Ability to disclose and discuss suicidal ideas Ability to demonstrate self-control will improve Ability to identify and develop effective coping behaviors will improve Ability to maintain clinical measurements within normal limits will improve Compliance with prescribed medications will improve Ability to identify triggers associated with substance abuse/mental health issues will improve  Medication Management: Evaluate patient's response, side effects, and tolerance of medication regimen.  Therapeutic Interventions: 1 to 1 sessions, Unit Group sessions and Medication administration.  Evaluation of Outcomes: Not Progressing  Physician Treatment Plan for Secondary Diagnosis: Principal Problem:   Schizoaffective disorder, bipolar type (HCC)  Long Term Goal(s): Improvement in symptoms so as ready for discharge   Short Term Goals: Ability to identify changes in lifestyle to reduce recurrence of condition will improve Ability to verbalize feelings will improve Ability to disclose and discuss suicidal ideas Ability to demonstrate self-control will improve Ability to identify and develop effective coping behaviors will improve Ability to maintain clinical measurements within normal limits will improve Compliance with prescribed medications will improve Ability to identify triggers associated with substance abuse/mental health issues will improve     Medication Management: Evaluate patient's response, side effects, and tolerance of medication regimen.  Therapeutic Interventions: 1 to 1 sessions, Unit Group sessions and Medication administration.  Evaluation of Outcomes: Not Progressing   RN Treatment Plan  for Primary Diagnosis: Schizoaffective disorder, bipolar type (HCC) Long Term Goal(s): Knowledge of disease and therapeutic regimen to maintain health will improve  Short Term Goals: Ability to demonstrate self-control, Ability to participate in decision making will improve, Ability to verbalize feelings will improve, Ability to disclose and discuss suicidal ideas, Ability to identify and develop effective coping behaviors will improve, and Compliance with prescribed medications will improve  Medication Management: RN will administer medications as ordered by provider, will assess and evaluate patient's response and provide education to patient for prescribed medication. RN will report any adverse and/or side effects to prescribing provider.  Therapeutic Interventions: 1 on 1 counseling sessions, Psychoeducation, Medication administration, Evaluate responses to treatment, Monitor vital signs and CBGs as ordered, Perform/monitor CIWA, COWS, AIMS and Fall Risk screenings as ordered, Perform wound care treatments as ordered.  Evaluation of Outcomes: Not Progressing   LCSW Treatment Plan for Primary Diagnosis: Schizoaffective disorder, bipolar type (HCC) Long Term Goal(s):  Safe transition to appropriate next level of care at discharge, Engage patient in therapeutic group addressing interpersonal concerns.  Short Term Goals: Engage patient in aftercare planning with referrals and resources, Increase social support, Increase ability to appropriately verbalize feelings, Increase emotional regulation, Facilitate acceptance of mental health diagnosis and concerns, and Increase skills for wellness and recovery  Therapeutic Interventions: Assess for all discharge needs, 1 to 1 time with Social worker, Explore available resources and support systems, Assess for adequacy in community support network, Educate family and significant other(s) on suicide prevention, Complete Psychosocial Assessment, Interpersonal  group therapy.  Evaluation of Outcomes: Not Progressing   Progress in Treatment: Attending groups: Yes. Participating in groups: No. Taking medication as prescribed: Yes. Toleration medication: Yes. Family/Significant other contact made: Yes, individual(s) contacted:  SPE completed with the patients spouse Patient understands diagnosis: Yes. Discussing patient identified problems/goals with staff: Yes. Medical problems stabilized or resolved: Yes. Denies suicidal/homicidal ideation: Yes. Issues/concerns per patient self-inventory: No. Other: none  New Short Term/Long Term Goal(s): elimination of symptoms of psychosis, medication management for mood stabilization; elimination of SI thoughts; development of comprehensive mental wellness plan.  Update 03/04/2022:  No changes at this time. Update 03/09/2022:  No changes at this time.    Patient Goals:  "get better"  Update 03/04/2022:  No changes at this time. Update 03/09/2022:  No changes at this time.    Discharge Plan or Barriers: CSW to assist patient in development of appropriate discharge plans.  Patient currently thinking of returning home and maintaining current mental health provider. Update 03/04/2022:  Patient remains on the unit and safe at this time.  Patient reports plans to continue with her current mental health providers. Update 03/09/2022:  Patient remains somewhat disoriented on the unit.  She has flashes of anger and defiance that can become problematic.  She remains safe.  CSW will continue to develop appropriate discharge plans.    Reason for Continuation of Hospitalization: Anxiety Depression Hallucinations Mania Medication stabilization   Estimated Length of Stay:  1-7 days Update 03/04/2022:  No changes at this time.   Last 3 [place] Suicide Severity Risk Score: Flowsheet Row Admission (Current) from 02/26/2022 in [place] Most recent reading at 03/08/2022  7:29 PM ED from 02/26/2022 in  [place] Most recent reading at 02/26/2022 10:35 AM ED from 10/18/2021 in [place] Most recent reading at 10/19/2021 10:02 AM  C-SSRS RISK CATEGORY No Risk No Risk No Risk       Last PHQ 2/9 Scores:    05/02/2015    1:50 PM  Depression screen PHQ 2/9  Decreased Interest 0  Down, Depressed, Hopeless 0  PHQ - 2 Score 0    Scribe for Treatment Team: [name], LCSW 03/09/2022 8:50 AM

## 2022-03-09 NOTE — Plan of Care (Signed)
?  Problem: Coping: ?Goal: Level of anxiety will decrease ?Outcome: Progressing ?  ?Problem: Safety: ?Goal: Ability to remain free from injury will improve ?Outcome: Progressing ?  ?

## 2022-03-10 DIAGNOSIS — F250 Schizoaffective disorder, bipolar type: Secondary | ICD-10-CM | POA: Diagnosis not present

## 2022-03-10 LAB — GLUCOSE, CAPILLARY
Glucose-Capillary: 132 mg/dL — ABNORMAL HIGH (ref 70–99)
Glucose-Capillary: 200 mg/dL — ABNORMAL HIGH (ref 70–99)
Glucose-Capillary: 193 mg/dL — ABNORMAL HIGH (ref 70–99)
Glucose-Capillary: 197 mg/dL — ABNORMAL HIGH (ref 70–99)

## 2022-03-10 MED ORDER — ARIPIPRAZOLE 5 MG PO TABS
20.0000 mg | ORAL_TABLET | Freq: Every morning | ORAL | Status: DC
  Administered 2022-03-11 – 2022-03-13 (×3): 20 mg via ORAL
  Filled 2022-03-10 (×3): qty 4

## 2022-03-10 NOTE — Progress Notes (Signed)
Pt is alert and oriented X4. Calm and cooperative. Vital signs are stable. Compliant to medications. Denies SI/HI/AVH.  No other issues. Patient is on Q 15 minutes checks. Patient went to bed after ate snacks and took night medications. Patient is on bed with eyes closed. Patient spent on bed with eyes closed most of the shift. No other issues. We will continue to monitor

## 2022-03-10 NOTE — Progress Notes (Signed)
Patient did not participate in group with LCSW. Patient was in her room.

## 2022-03-10 NOTE — Group Note (Signed)
[facility] LCSW Group Therapy Note   Group Date: 03/10/2022 Start Time: 1300 End Time: 1400   Type of Therapy and Topic: Group Therapy: Avoiding Self-Sabotaging and Enabling Behaviors  Participation Level: Did Not Attend  Mood:  Description of Group:  In this group, patients will learn how to identify obstacles, self-sabotaging and enabling behaviors, as well as: what are they, why do we do them and what needs these behaviors meet. Discuss unhealthy relationships and how to have positive healthy boundaries with those that sabotage and enable. Explore aspects of self-sabotage and enabling in yourself and how to limit these self-destructive behaviors in everyday life.   Therapeutic Goals: 1. Patient will identify one obstacle that relates to self-sabotage and enabling behaviors 2. Patient will identify one personal self-sabotaging or enabling behavior they did prior to admission 3. Patient will state a plan to change the above identified behavior 4. Patient will demonstrate ability to communicate their needs through discussion and/or role play.    Summary of Patient Progress:   Patient did not attend group despite encouraged participation.    Therapeutic Modalities:  Cognitive Behavioral Therapy Person-Centered Therapy Motivational Interviewing    Aunica Dauphinee W Lindalou Soltis, LCSWA 

## 2022-03-10 NOTE — Inpatient Diabetes Management (Signed)
Inpatient Diabetes Program Recommendations  AACE/ADA: New Consensus Statement on Inpatient Glycemic Control   Target Ranges:  Prepandial:   less than 140 mg/dL      Peak postprandial:   less than 180 mg/dL (1-2 hours)      Critically ill patients:  140 - 180 mg/dL    Latest Reference Range & Units 03/09/22 07:36 03/09/22 11:32 03/09/22 16:37 03/09/22 19:56 03/10/22 07:33  Glucose-Capillary 70 - 99 mg/dL 175 (H) 233 (H) 231 (H) 224 (H) 132 (H)   Review of Glycemic Control  Diabetes history: DM2 Outpatient Diabetes medications: Metformin 1000 mg BID, Jardiance 10 mg daily, Glipizide 5 mg daily Current orders for Inpatient glycemic control: Semglee 10 units QHS, Novolog 0-15 units TID with meals, Novolog 0-5 units QHS, Glipizide 5 mg QAM, Metformin 1000 mg BID   Inpatient Diabetes Program Recommendations:     Insulin: Please consider ordering Novolog 4 units TID with meals for meal coverage if patient eats at least 50% of meals.   Thanks, Barnie Alderman, RN, MSN, [place] Diabetes Coordinator Inpatient Diabetes Program [phone] (Team Pager from 8am to [place])

## 2022-03-10 NOTE — Plan of Care (Signed)
D- Patient alert and oriented. Patient presents with a flat mood and affect. Patient is not very verbal or assertive but will respond and engage if spoken to. Patient denies SI, HI, AVH, and pain. Patient states that she does not have goal for the day.    A- Scheduled medications administered to patient, per MD orders. Support and encouragement provided.  Routine safety checks conducted every 15 minutes.  Patient informed to notify staff with problems or concerns.  R- No adverse drug reactions noted. Patient contracts for safety at this time. Patient compliant with medications and treatment plan. Patient receptive, calm, and cooperative. Patient did not interact with others on the unit. Patient in her room the majority of the shift. Patient only out of her room for meal time and snack. Writer encouraged patient to choose healthier food options at snack time for better blood sugar management. Patient remains safe at this time.   Problem: Clinical Measurements: Goal: Will remain free from infection Outcome: Progressing   Problem: Nutrition: Goal: Adequate nutrition will be maintained Outcome: Progressing   Problem: Coping: Goal: Level of anxiety will decrease Outcome: Progressing   Problem: Education: Goal: Emotional status will improve Outcome: Progressing Goal: Mental status will improve Outcome: Progressing

## 2022-03-10 NOTE — Progress Notes (Signed)
[facility] MD Progress Note  03/10/2022 5:32 PM Rita Sullivan  MRN:  [id number] Subjective: Patient seen for follow-up.  This is a 66 year old woman with schizoaffective disorder.  Patient tells me she is not feeling as good as she would like to.  She has a hard time putting it in words.  She does acknowledge having hallucinations.  Denies suicidal or homicidal ideation but says she just does not feel right and feels very irritable.  Nurses have also noticed that she is more irritable and seems more paranoid than when she first came in.  More grumpy on the unit although not physically violent Principal Problem: Schizoaffective disorder, bipolar type (HCC) Diagnosis: Principal Problem:   Schizoaffective disorder, bipolar type (HCC)  Total Time spent with patient: 30 minutes  Past Psychiatric History: Past history of schizophrenia  Past Medical History:  Past Medical History:  Diagnosis Date   Bipolar disorder (HCC)    Chronic kidney disease    Diabetes mellitus without complication (HCC)    Hypercholesteremia    Manic behavior (HCC)     Past Surgical History:  Procedure Laterality Date   COLONOSCOPY WITH PROPOFOL N/A 03/11/2015   Procedure: COLONOSCOPY WITH PROPOFOL;  Surgeon: Wallace Cullens, MD;  Location: [facility] ENDOSCOPY;  Service: Gastroenterology;  Laterality: N/A;   Family History:  Family History  Problem Relation Age of Onset   Diabetes Father    Diabetes Sister    Family Psychiatric  History: See previous Social History:  Social History   Substance and Sexual Activity  Alcohol Use Yes   Alcohol/week: 2.0 standard drinks of alcohol   Types: 2 Glasses of wine per week   Comment: Occ     Social History   Substance and Sexual Activity  Drug Use No    Social History   Socioeconomic History   Marital status: Married    Spouse name: Not on file   Number of children: Not on file   Years of education: Not on file   Highest education level: Not on file  Occupational  History   Not on file  Tobacco Use   Smoking status: Never   Smokeless tobacco: Never  Substance and Sexual Activity   Alcohol use: Yes    Alcohol/week: 2.0 standard drinks of alcohol    Types: 2 Glasses of wine per week    Comment: Occ   Drug use: No   Sexual activity: Never  Other Topics Concern   Not on file  Social History Narrative   Not on file   Social Determinants of Health   Financial Resource Strain: Not on file  Food Insecurity: No Food Insecurity (02/26/2022)   Hunger Vital Sign    Worried About Running Out of Food in the Last Year: Never true    Ran Out of Food in the Last Year: Never true  Transportation Needs: No Transportation Needs (02/26/2022)   PRAPARE - Administrator, Civil Service (Medical): No    Lack of Transportation (Non-Medical): No  Physical Activity: Not on file  Stress: Not on file  Social Connections: Not on file   Additional Social History:    Pain Medications: See PTA Prescriptions: See PTA Over the Counter: See PTA History of alcohol / drug use?: No history of alcohol / drug abuse Longest period of sobriety (when/how long): Unable to quantify                    Sleep: Fair  Appetite:  Fair  Current Medications: Current Facility-Administered Medications  Medication Dose Route Frequency Provider Last Rate Last Admin   acetaminophen (TYLENOL) tablet 650 mg  650 mg Oral Q6H PRN Sherlon Handing, NP       alum & mag hydroxide-simeth (MAALOX/MYLANTA) 200-200-20 MG/5ML suspension 30 mL  30 mL Oral Q4H PRN Sherlon Handing, NP       [START ON 03/11/2022] ARIPiprazole (ABILIFY) tablet 20 mg  20 mg Oral q morning Brandilee Pies T, MD       atorvastatin (LIPITOR) tablet 40 mg  40 mg Oral Daily Waldon Merl F, NP   40 mg at 03/10/22 1018   carbamazepine (TEGRETOL) tablet 400 mg  400 mg Oral TID Parks Ranger, DO   400 mg at 03/10/22 1655   cholecalciferol (VITAMIN D3) 25 MCG (1000 UNIT) tablet 2,000 Units   2,000 Units Oral Daily Waldon Merl F, NP   2,000 Units at 55/73/22 0254   folic acid (FOLVITE) tablet 0.5 mg  500 mcg Oral Daily Waldon Merl F, NP   0.5 mg at 03/10/22 1019   glipiZIDE (GLUCOTROL) tablet 5 mg  5 mg Oral Q breakfast Waldon Merl F, NP   5 mg at 03/10/22 0807   insulin aspart (novoLOG) injection 0-15 Units  0-15 Units Subcutaneous TID WC Sherlon Handing, NP   3 Units at 03/10/22 1654   insulin aspart (novoLOG) injection 0-5 Units  0-5 Units Subcutaneous QHS Waldon Merl F, NP   2 Units at 03/09/22 2126   insulin glargine-yfgn (SEMGLEE) injection 10 Units  10 Units Subcutaneous QHS Parks Ranger, DO   10 Units at 03/09/22 2127   LORazepam (ATIVAN) tablet 1 mg  1 mg Oral Q6H PRN Parks Ranger, DO       losartan (COZAAR) tablet 25 mg  25 mg Oral Daily Waldon Merl F, NP   25 mg at 03/10/22 1020   magnesium hydroxide (MILK OF MAGNESIA) suspension 30 mL  30 mL Oral Daily PRN Waldon Merl F, NP       metFORMIN (GLUCOPHAGE) tablet 1,000 mg  1,000 mg Oral BID WC Waldon Merl F, NP   1,000 mg at 03/10/22 1655   traZODone (DESYREL) tablet 100 mg  100 mg Oral QHS Waldon Merl F, NP   100 mg at 03/09/22 2124    Lab Results:  Results for orders placed or performed during the hospital encounter of 02/26/22 (from the past 48 hour(s))  Glucose, capillary     Status: Abnormal   Collection Time: 03/08/22  7:33 PM  Result Value Ref Range   Glucose-Capillary 234 (H) 70 - 99 mg/dL    Comment: Glucose reference range applies only to samples taken after fasting for at least 8 hours.  Glucose, capillary     Status: Abnormal   Collection Time: 03/09/22  7:36 AM  Result Value Ref Range   Glucose-Capillary 175 (H) 70 - 99 mg/dL    Comment: Glucose reference range applies only to samples taken after fasting for at least 8 hours.  Glucose, capillary     Status: Abnormal   Collection Time: 03/09/22 11:32 AM  Result Value Ref Range    Glucose-Capillary 233 (H) 70 - 99 mg/dL    Comment: Glucose reference range applies only to samples taken after fasting for at least 8 hours.  Glucose, capillary     Status: Abnormal   Collection Time: 03/09/22  4:37 PM  Result Value Ref Range   Glucose-Capillary 231 (H) 70 - 99  mg/dL    Comment: Glucose reference range applies only to samples taken after fasting for at least 8 hours.  Glucose, capillary     Status: Abnormal   Collection Time: 03/09/22  7:56 PM  Result Value Ref Range   Glucose-Capillary 224 (H) 70 - 99 mg/dL    Comment: Glucose reference range applies only to samples taken after fasting for at least 8 hours.   Comment 1 Notify RN   Glucose, capillary     Status: Abnormal   Collection Time: 03/10/22  7:33 AM  Result Value Ref Range   Glucose-Capillary 132 (H) 70 - 99 mg/dL    Comment: Glucose reference range applies only to samples taken after fasting for at least 8 hours.  Glucose, capillary     Status: Abnormal   Collection Time: 03/10/22 11:21 AM  Result Value Ref Range   Glucose-Capillary 200 (H) 70 - 99 mg/dL    Comment: Glucose reference range applies only to samples taken after fasting for at least 8 hours.  Glucose, capillary     Status: Abnormal   Collection Time: 03/10/22  4:34 PM  Result Value Ref Range   Glucose-Capillary 193 (H) 70 - 99 mg/dL    Comment: Glucose reference range applies only to samples taken after fasting for at least 8 hours.    Blood Alcohol level:  Lab Results  Component Value Date   ETH <10 02/26/2022   ETH <10 10/18/2021    Metabolic Disorder Labs: Lab Results  Component Value Date   HGBA1C 8.3 (H) 03/06/2022   MPG 191.51 03/06/2022   MPG 134.11 02/20/2017   Lab Results  Component Value Date   PROLACTIN 23.3 02/20/2017   PROLACTIN 3.6 (L) 07/31/2016   Lab Results  Component Value Date   CHOL 194 03/06/2022   TRIG 113 03/06/2022   HDL 63 03/06/2022   CHOLHDL 3.1 03/06/2022   VLDL 23 03/06/2022   LDLCALC 108 (H)  03/06/2022   LDLCALC 131 (H) 02/20/2017    Physical Findings: AIMS:  , ,  ,  ,    CIWA:    COWS:     Musculoskeletal: Strength & Muscle Tone: within normal limits Gait & Station: normal Patient leans: N/A  Psychiatric Specialty Exam:  Presentation  General Appearance:  Casual  Eye Contact: Fair  Speech: Clear and Coherent  Speech Volume: Normal  Handedness: Right   Mood and Affect  Mood: -- (Stated "good")  Affect: Congruent   Thought Process  Thought Processes: Coherent  Descriptions of Associations:Intact  Orientation:Full (Time, Place and Person)  Thought Content:WDL  History of Schizophrenia/Schizoaffective disorder:No  Duration of Psychotic Symptoms:Greater than six months  Hallucinations:Hallucinations: None (Denies)  Ideas of Reference:None  Suicidal Thoughts:Suicidal Thoughts: No  Homicidal Thoughts:Homicidal Thoughts: No   Sensorium  Memory: Immediate Good  Judgment: Impaired  Insight: Poor   Art therapist  Concentration: Fair  Attention Span: Fair  Recall: [employer] of Knowledge: Fair  Language: Fair   Psychomotor Activity  Psychomotor Activity: Psychomotor Activity: Normal   Assets  Assets: Resilience; Social Support; Health and safety inspector; Housing; Desire for Improvement   Sleep  Sleep:No data recorded   Physical Exam: Physical Exam Vitals and nursing note reviewed.  Constitutional:      Appearance: Normal appearance.  HENT:     Head: Normocephalic and atraumatic.     Mouth/Throat:     Pharynx: Oropharynx is clear.  Eyes:     Pupils: Pupils are equal, round, and reactive to light.  Cardiovascular:     Rate and Rhythm: Normal rate and regular rhythm.  Pulmonary:     Effort: Pulmonary effort is normal.     Breath sounds: Normal breath sounds.  Abdominal:     General: Abdomen is flat.     Palpations: Abdomen is soft.  Musculoskeletal:        General: Normal range of  motion.  Skin:    General: Skin is warm and dry.  Neurological:     General: No focal deficit present.     Mental Status: She is alert. Mental status is at baseline.  Psychiatric:        Attention and Perception: Attention normal.        Mood and Affect: Mood normal.        Speech: Speech normal.        Behavior: Behavior normal.        Thought Content: Thought content normal.        Cognition and Memory: Cognition normal.    Review of Systems  Constitutional: Negative.   HENT: Negative.    Eyes: Negative.   Respiratory: Negative.    Cardiovascular: Negative.   Gastrointestinal: Negative.   Musculoskeletal: Negative.   Skin: Negative.   Neurological: Negative.   Psychiatric/Behavioral:  The patient is nervous/anxious.    Blood pressure 131/84, pulse 99, temperature 97.9 F (36.6 C), temperature source Oral, resp. rate 18, height 5\' 2"  (1.575 m), weight 76.4 kg, SpO2 97 %. Body mass index is 30.82 kg/m.   Treatment Plan Summary: Plan patient may be switching into a manic episode from depression or may just be revealing more psychotic symptoms of baseline.  Plan to increase Abilify to 20 mg a day.  Continue to monitor symptoms  , MD 03/10/2022, 5:32 PM

## 2022-03-11 DIAGNOSIS — F250 Schizoaffective disorder, bipolar type: Secondary | ICD-10-CM | POA: Diagnosis not present

## 2022-03-11 LAB — GLUCOSE, CAPILLARY
Glucose-Capillary: 160 mg/dL — ABNORMAL HIGH (ref 70–99)
Glucose-Capillary: 247 mg/dL — ABNORMAL HIGH (ref 70–99)
Glucose-Capillary: 189 mg/dL — ABNORMAL HIGH (ref 70–99)
Glucose-Capillary: 255 mg/dL — ABNORMAL HIGH (ref 70–99)

## 2022-03-11 NOTE — Progress Notes (Signed)
Patient is alert and oriented times 3. Mood and affect flat and childlike. Patient denies pain. She denies SI, HI, and AVH. Also denies feelings of anxiety and depression at this time. States she slept good last night. Morning meds given whole by mouth W/O difficulty. Ate breakfast in day room- appetite good. Blood sugar 160- 3U Novolog given as ordered. Patient remains safe on unit with Q15 minute checks in place.

## 2022-03-11 NOTE — Group Note (Signed)
[facility] LCSW Group Therapy Note   Group Date: 03/11/2022 Start Time: 6789 End Time: 1415   Type of Therapy/Topic:  Group Therapy:  Emotion Regulation  Participation Level:  Active   Mood:  Description of Group:    The purpose of this group is to assist patients in learning to regulate negative emotions and experience positive emotions. Patients will be guided to discuss ways in which they have been vulnerable to their negative emotions. These vulnerabilities will be juxtaposed with experiences of positive emotions or situations, and patients challenged to use positive emotions to combat negative ones. Special emphasis will be placed on coping with negative emotions in conflict situations, and patients will process healthy conflict resolution skills.  Therapeutic Goals: Patient will identify two positive emotions or experiences to reflect on in order to balance out negative emotions:  Patient will label two or more emotions that they find the most difficult to experience:  Patient will be able to demonstrate positive conflict resolution skills through discussion or role plays:   Summary of Patient Progress: Patient was present in group.  Patient shared that she has been feeling "discouragement" lately.  She reports that she has been upset that she has been hospitalized for 13 days and feels she is ready to be dishcarged.  CSW encouraged patient to discuss this with the psychiatrist.  Therapeutic Modalities:   Cognitive Behavioral Therapy Feelings Identification Dialectical Behavioral Therapy   [name], LCSW

## 2022-03-11 NOTE — Plan of Care (Signed)
  Problem: Education: Goal: Knowledge of General Education information will improve Description: Including pain rating scale, medication(s)/side effects and non-pharmacologic comfort measures Outcome: Progressing   Problem: Health Behavior/Discharge Planning: Goal: Ability to manage health-related needs will improve Outcome: Progressing   Problem: Clinical Measurements: Goal: Ability to maintain clinical measurements within normal limits will improve Outcome: Progressing Goal: Will remain free from infection Outcome: Progressing Goal: Diagnostic test results will improve Outcome: Progressing Goal: Respiratory complications will improve Outcome: Progressing Goal: Cardiovascular complication will be avoided Outcome: Progressing   Problem: Coping: Goal: Level of anxiety will decrease Outcome: Progressing   Problem: Nutrition: Goal: Adequate nutrition will be maintained Outcome: Progressing   Problem: Elimination: Goal: Will not experience complications related to bowel motility Outcome: Progressing Goal: Will not experience complications related to urinary retention Outcome: Progressing   Problem: Skin Integrity: Goal: Risk for impaired skin integrity will decrease Outcome: Progressing   Problem: Education: Goal: Knowledge of [place] [facility] Education information/materials will improve Outcome: Progressing Goal: Emotional status will improve Outcome: Progressing Goal: Mental status will improve Outcome: Progressing Goal: Verbalization of understanding the information provided will improve Outcome: Progressing   Problem: Activity: Goal: Interest or engagement in activities will improve Outcome: Progressing Goal: Sleeping patterns will improve Outcome: Progressing   Problem: Coping: Goal: Ability to verbalize frustrations and anger appropriately will improve Outcome: Progressing Goal: Ability to demonstrate self-control will improve Outcome: Progressing    Problem: Physical Regulation: Goal: Ability to maintain clinical measurements within normal limits will improve Outcome: Progressing   Problem: Safety: Goal: Periods of time without injury will increase Outcome: Progressing

## 2022-03-11 NOTE — Progress Notes (Signed)
[facility] MD Progress Note  03/11/2022 4:44 PM Rita Sullivan  MRN:  [id number] Subjective: Follow-up with this 66 year old woman with schizoaffective disorder.  Patient seen and chart reviewed.  Patient states that she is feeling grumpy today because someone gave her a full sugared ginger ale when she asked for a diet ginger ale.  Denies hallucinations.  Denies suicidal or homicidal thought.  Says that she feels like she wants to be discharged. Principal Problem: Schizoaffective disorder, bipolar type (HCC) Diagnosis: Principal Problem:   Schizoaffective disorder, bipolar type (HCC)  Total Time spent with patient: 30 minutes  Past Psychiatric History: Past history of schizoaffective disorder  Past Medical History:  Past Medical History:  Diagnosis Date   Bipolar disorder (HCC)    Chronic kidney disease    Diabetes mellitus without complication (HCC)    Hypercholesteremia    Manic behavior (HCC)     Past Surgical History:  Procedure Laterality Date   COLONOSCOPY WITH PROPOFOL N/A 03/11/2015   Procedure: COLONOSCOPY WITH PROPOFOL;  Surgeon: Wallace Cullens, MD;  Location: [facility]COPY;  Service: Gastroenterology;  Laterality: N/A;   Family History:  Family History  Problem Relation Age of Onset   Diabetes Father    Diabetes Sister    Family Psychiatric  History: See previous Social History:  Social History   Substance and Sexual Activity  Alcohol Use Yes   Alcohol/week: 2.0 standard drinks of alcohol   Types: 2 Glasses of wine per week   Comment: Occ     Social History   Substance and Sexual Activity  Drug Use No    Social History   Socioeconomic History   Marital status: Married    Spouse name: Not on file   Number of children: Not on file   Years of education: Not on file   Highest education level: Not on file  Occupational History   Not on file  Tobacco Use   Smoking status: Never   Smokeless tobacco: Never  Substance and Sexual Activity   Alcohol use: Yes     Alcohol/week: 2.0 standard drinks of alcohol    Types: 2 Glasses of wine per week    Comment: Occ   Drug use: No   Sexual activity: Never  Other Topics Concern   Not on file  Social History Narrative   Not on file   Social Determinants of Health   Financial Resource Strain: Not on file  Food Insecurity: No Food Insecurity (02/26/2022)   Hunger Vital Sign    Worried About Running Out of Food in the Last Year: Never true    Ran Out of Food in the Last Year: Never true  Transportation Needs: No Transportation Needs (02/26/2022)   PRAPARE - Administrator, Civil Service (Medical): No    Lack of Transportation (Non-Medical): No  Physical Activity: Not on file  Stress: Not on file  Social Connections: Not on file   Additional Social History:    Pain Medications: See PTA Prescriptions: See PTA Over the Counter: See PTA History of alcohol / drug use?: No history of alcohol / drug abuse Longest period of sobriety (when/how long): Unable to quantify                    Sleep: Fair  Appetite:  Fair  Current Medications: Current Facility-Administered Medications  Medication Dose Route Frequency Provider Last Rate Last Admin   acetaminophen (TYLENOL) tablet 650 mg  650 mg Oral Q6H PRN Gabriel Cirri  F, NP       alum & mag hydroxide-simeth (MAALOX/MYLANTA) 200-200-20 MG/5ML suspension 30 mL  30 mL Oral Q4H PRN Waldon Merl F, NP       ARIPiprazole (ABILIFY) tablet 20 mg  20 mg Oral q morning Honest Safranek T, MD   20 mg at 03/11/22 0845   atorvastatin (LIPITOR) tablet 40 mg  40 mg Oral Daily Waldon Merl F, NP   40 mg at 03/11/22 0846   carbamazepine (TEGRETOL) tablet 400 mg  400 mg Oral TID Parks Ranger, DO   400 mg at 03/11/22 0846   cholecalciferol (VITAMIN D3) 25 MCG (1000 UNIT) tablet 2,000 Units  2,000 Units Oral Daily Waldon Merl F, NP   2,000 Units at 09/32/35 5732   folic acid (FOLVITE) tablet 0.5 mg  500 mcg Oral Daily Waldon Merl F, NP   0.5 mg at 03/11/22 0846   glipiZIDE (GLUCOTROL) tablet 5 mg  5 mg Oral Q breakfast Waldon Merl F, NP   5 mg at 03/11/22 0846   insulin aspart (novoLOG) injection 0-15 Units  0-15 Units Subcutaneous TID WC Waldon Merl F, NP   5 Units at 03/11/22 1136   insulin aspart (novoLOG) injection 0-5 Units  0-5 Units Subcutaneous QHS Waldon Merl F, NP   2 Units at 03/09/22 2126   insulin glargine-yfgn (SEMGLEE) injection 10 Units  10 Units Subcutaneous QHS Parks Ranger, DO   10 Units at 03/10/22 2150   LORazepam (ATIVAN) tablet 1 mg  1 mg Oral Q6H PRN Parks Ranger, DO   1 mg at 03/11/22 0846   losartan (COZAAR) tablet 25 mg  25 mg Oral Daily Waldon Merl F, NP   25 mg at 03/11/22 0846   magnesium hydroxide (MILK OF MAGNESIA) suspension 30 mL  30 mL Oral Daily PRN Waldon Merl F, NP       metFORMIN (GLUCOPHAGE) tablet 1,000 mg  1,000 mg Oral BID WC Waldon Merl F, NP   1,000 mg at 03/11/22 0846   traZODone (DESYREL) tablet 100 mg  100 mg Oral QHS Waldon Merl F, NP   100 mg at 03/10/22 2150    Lab Results:  Results for orders placed or performed during the hospital encounter of 02/26/22 (from the past 48 hour(s))  Glucose, capillary     Status: Abnormal   Collection Time: 03/09/22  7:56 PM  Result Value Ref Range   Glucose-Capillary 224 (H) 70 - 99 mg/dL    Comment: Glucose reference range applies only to samples taken after fasting for at least 8 hours.   Comment 1 Notify RN   Glucose, capillary     Status: Abnormal   Collection Time: 03/10/22  7:33 AM  Result Value Ref Range   Glucose-Capillary 132 (H) 70 - 99 mg/dL    Comment: Glucose reference range applies only to samples taken after fasting for at least 8 hours.  Glucose, capillary     Status: Abnormal   Collection Time: 03/10/22 11:21 AM  Result Value Ref Range   Glucose-Capillary 200 (H) 70 - 99 mg/dL    Comment: Glucose reference range applies only to samples taken after  fasting for at least 8 hours.  Glucose, capillary     Status: Abnormal   Collection Time: 03/10/22  4:34 PM  Result Value Ref Range   Glucose-Capillary 193 (H) 70 - 99 mg/dL    Comment: Glucose reference range applies only to samples taken after fasting for at least 8 hours.  Glucose, capillary     Status: Abnormal   Collection Time: 03/10/22  8:26 PM  Result Value Ref Range   Glucose-Capillary 197 (H) 70 - 99 mg/dL    Comment: Glucose reference range applies only to samples taken after fasting for at least 8 hours.   Comment 1 Notify RN   Glucose, capillary     Status: Abnormal   Collection Time: 03/11/22  7:38 AM  Result Value Ref Range   Glucose-Capillary 160 (H) 70 - 99 mg/dL    Comment: Glucose reference range applies only to samples taken after fasting for at least 8 hours.  Glucose, capillary     Status: Abnormal   Collection Time: 03/11/22 11:30 AM  Result Value Ref Range   Glucose-Capillary 247 (H) 70 - 99 mg/dL    Comment: Glucose reference range applies only to samples taken after fasting for at least 8 hours.  Glucose, capillary     Status: Abnormal   Collection Time: 03/11/22  4:13 PM  Result Value Ref Range   Glucose-Capillary 189 (H) 70 - 99 mg/dL    Comment: Glucose reference range applies only to samples taken after fasting for at least 8 hours.    Blood Alcohol level:  Lab Results  Component Value Date   ETH <10 02/26/2022   ETH <10 10/18/2021    Metabolic Disorder Labs: Lab Results  Component Value Date   HGBA1C 8.3 (H) 03/06/2022   MPG 191.51 03/06/2022   MPG 134.11 02/20/2017   Lab Results  Component Value Date   PROLACTIN 23.3 02/20/2017   PROLACTIN 3.6 (L) 07/31/2016   Lab Results  Component Value Date   CHOL 194 03/06/2022   TRIG 113 03/06/2022   HDL 63 03/06/2022   CHOLHDL 3.1 03/06/2022   VLDL 23 03/06/2022   LDLCALC 108 (H) 03/06/2022   LDLCALC 131 (H) 02/20/2017    Physical Findings: AIMS:  , ,  ,  ,    CIWA:    COWS:      Musculoskeletal: Strength & Muscle Tone: within normal limits Gait & Station: normal Patient leans: N/A  Psychiatric Specialty Exam:  Presentation  General Appearance:  Casual  Eye Contact: Fair  Speech: Clear and Coherent  Speech Volume: Normal  Handedness: Right   Mood and Affect  Mood: -- (Stated "good")  Affect: Congruent   Thought Process  Thought Processes: Coherent  Descriptions of Associations:Intact  Orientation:Full (Time, Place and Person)  Thought Content:WDL  History of Schizophrenia/Schizoaffective disorder:No  Duration of Psychotic Symptoms:Greater than six months  Hallucinations:No data recorded Ideas of Reference:None  Suicidal Thoughts:No data recorded Homicidal Thoughts:No data recorded  Sensorium  Memory: Immediate Good  Judgment: Impaired  Insight: Poor   Art therapist  Concentration: Fair  Attention Span: Fair  Recall: [employer] of Knowledge: Fair  Language: Fair   Psychomotor Activity  Psychomotor Activity:No data recorded  Assets  Assets: Resilience; Social Support; Health and safety inspector; Housing; Desire for Improvement   Sleep  Sleep:No data recorded   Physical Exam: Physical Exam Vitals and nursing note reviewed.  Constitutional:      Appearance: Normal appearance.  HENT:     Head: Normocephalic and atraumatic.     Mouth/Throat:     Pharynx: Oropharynx is clear.  Eyes:     Pupils: Pupils are equal, round, and reactive to light.  Cardiovascular:     Rate and Rhythm: Normal rate and regular rhythm.  Pulmonary:     Effort: Pulmonary effort is normal.  Breath sounds: Normal breath sounds.  Abdominal:     General: Abdomen is flat.     Palpations: Abdomen is soft.  Musculoskeletal:        General: Normal range of motion.  Skin:    General: Skin is warm and dry.  Neurological:     General: No focal deficit present.     Mental Status: She is alert. Mental status  is at baseline.  Psychiatric:        Attention and Perception: Attention normal.        Mood and Affect: Mood normal. Affect is blunt.        Speech: Speech normal.        Behavior: Behavior normal.        Thought Content: Thought content normal.        Cognition and Memory: Cognition normal.    Review of Systems  Constitutional: Negative.   HENT: Negative.    Eyes: Negative.   Respiratory: Negative.    Cardiovascular: Negative.   Gastrointestinal: Negative.   Musculoskeletal: Negative.   Skin: Negative.   Neurological: Negative.   Psychiatric/Behavioral: Negative.     Blood pressure (!) 122/92, pulse (!) 124, temperature 97.8 F (36.6 C), temperature source Oral, resp. rate 20, height 5\' 2"  (1.575 m), weight 76.4 kg, SpO2 100 %. Body mass index is 30.82 kg/m.   Treatment Plan Summary: Medication management and Plan patient certainly seems out of sorts today but not psychotic and denies being suicidal.  Does not appear to be severely depressed or manic right now although may still be slipping into a little more depression.  Increase dose of Abilify yesterday.  No change to medicine today.  Continue daily assessment  , MD 03/11/2022, 4:44 PM

## 2022-03-12 DIAGNOSIS — F250 Schizoaffective disorder, bipolar type: Secondary | ICD-10-CM | POA: Diagnosis not present

## 2022-03-12 LAB — GLUCOSE, CAPILLARY
Glucose-Capillary: 178 mg/dL — ABNORMAL HIGH (ref 70–99)
Glucose-Capillary: 266 mg/dL — ABNORMAL HIGH (ref 70–99)
Glucose-Capillary: 154 mg/dL — ABNORMAL HIGH (ref 70–99)
Glucose-Capillary: 233 mg/dL — ABNORMAL HIGH (ref 70–99)

## 2022-03-12 NOTE — B[facility] Counselor (Signed)
CSW spoke with the patient's husband, Rita Sullivan, [phone].  He reports that he has not spoke with her since admission.  He reports that he took patient's clothing home at admission.  He reports that he is unsure of patient returning home as he has not spoken with her.  He reports that if she does not speak with him that means that she is unwell and should not return home.  He reports that if she asks for "fancy shoes or clothes" she is unwell.  He declined to make plans for pick up until he has spoken with the patient.  Assunta Curtis, MSW, LCSW 03/12/2022 12:48 PM

## 2022-03-12 NOTE — Progress Notes (Signed)
   03/11/22 1925  Psych Admission Type (Psych Patients Only)  Admission Status Voluntary  Psychosocial Assessment  Patient Complaints None  Eye Contact Fair  Facial Expression Flat  Affect Flat  Speech Logical/coherent  Interaction Minimal  Motor Activity Slow  Appearance/Hygiene Unremarkable;In scrubs  Behavior Characteristics Cooperative  Mood Pleasant  Thought Process  Coherency WDL  Content WDL  Delusions None reported or observed  Perception WDL  Hallucination None reported or observed  Judgment Impaired  Confusion WDL  Danger to Self  Current suicidal ideation? Denies  Danger to Others  Danger to Others None reported or observed   Progress note   D: Pt seen in room. Pt denies SI, HI, AVH. Pt rates pain  0/10. Pt rates anxiety  0/10 and depression  0/10. Pt stated that the one good thing about her day was that she got to eat something new - pinto beans. Pt is minimal this evening, staying in her room. No other concerns noted at this time.  A: Pt provided support and encouragement. Pt given scheduled medication as prescribed. PRNs as appropriate. Q15 min checks for safety.   R: Pt safe on the unit. Will continue to monitor.

## 2022-03-12 NOTE — Inpatient Diabetes Management (Signed)
Inpatient Diabetes Program Recommendations  AACE/ADA: New Consensus Statement on Inpatient Glycemic Control   Target Ranges:  Prepandial:   less than 140 mg/dL      Peak postprandial:   less than 180 mg/dL (1-2 hours)      Critically ill patients:  140 - 180 mg/dL    Latest Reference Range & Units 03/11/22 07:38 03/11/22 11:30 03/11/22 16:13 03/11/22 20:25 03/12/22 07:36  Glucose-Capillary 70 - 99 mg/dL 160 (H) 247 (H) 189 (H) 255 (H) 178 (H)   Review of Glycemic Control  Diabetes history: DM2 Outpatient Diabetes medications: Metformin 1000 mg BID, Jardiance 10 mg daily, Glipizide 5 mg daily Current orders for Inpatient glycemic control: Semglee 10 units QHS, Novolog 0-15 units TID with meals, Novolog 0-5 units QHS, Glipizide 5 mg QAM, Metformin 1000 mg BID   Inpatient Diabetes Program Recommendations:     Insulin: Please consider ordering Novolog 3 units TID with meals for meal coverage if patient eats at least 50% of meals.   Thanks, Barnie Alderman, RN, MSN, [place] [facility] Coordinator Inpatient Diabetes Program [phone] (Team Pager from 8am to [place])

## 2022-03-12 NOTE — Progress Notes (Signed)
Patient walked in the dayroom when prompted by Probation officer for CBG check. Patient in the dayroom corner shouting that she does not need her CBG checked. Patient shouting, "my sugar is fine and does not need to be checked. You will not check my sugar". Patient finally compliant and cooperative after a few minutes and allowed writer to perform CBG check.

## 2022-03-12 NOTE — Progress Notes (Signed)
   03/12/22 1920  Psych Admission Type (Psych Patients Only)  Admission Status Voluntary  Psychosocial Assessment  Patient Complaints None  Eye Contact Fair  Facial Expression Flat  Affect Flat  Speech Logical/coherent  Interaction Minimal  Motor Activity Slow  Appearance/Hygiene Unremarkable;In scrubs  Behavior Characteristics Cooperative  Mood Pleasant  Thought Process  Coherency WDL  Content WDL  Delusions None reported or observed  Perception WDL  Hallucination None reported or observed  Judgment WDL  Confusion None  Danger to Self  Current suicidal ideation? Denies  Danger to Others  Danger to Others None reported or observed   Progress note   D: Pt seen in her room in the bed. Pt denies SI, HI, AVH. Pt rates pain  0/10. Pt rates anxiety  0/10 and depression  0/10. Pt states that she is excited because she is going home tomorrow. Pt states that a good thing about her day is that she had Crystal Light in her water to give it a different taste. Pt asking about snack time. No other concerns noted at this time.  A: Pt provided support and encouragement. Pt given scheduled medication as prescribed. PRNs as appropriate. Q15 min checks for safety.   R: Pt safe on the unit. Will continue to monitor.

## 2022-03-12 NOTE — Progress Notes (Signed)
Pt asking for more snacks. Pt CBG=233 this evening. Pt had 2 cheese sticks and 2 sugar free puddings. Pt cautioned against more snacks d/t her blood glucose level. Pt given water with sugar free flavoring in it. Pt went back to her room.

## 2022-03-12 NOTE — Plan of Care (Signed)
D- Patient alert and oriented. Patient presenting with flat mood and affect. Patient denies SI, HI, AVH, and pain. Patient expressed that the unit was a magical place. Patient insist that someone told her that the unit was the magical place. Patient states her goal is to go outside if weather permits or look out the window more and not just lay in the bed all day. Patient in her room laying in bed most of the shift.   A- Scheduled medications administered to patient, per MD orders. Support and encouragement provided.  Patient education reminders on glucose management throughout the shift. Routine safety checks conducted every 15 minutes.  Patient informed to notify staff with problems or concerns.  R- No adverse drug reactions noted. Patient contracts for safety at this time. Patient compliant with medications and treatment plan. Patient receptive, calm, and cooperative. Patient interacts well with others on the unit.  Patient remains safe at this time.   Problem: Health Behavior/Discharge Planning: Goal: Ability to manage health-related needs will improve Outcome: Progressing   Problem: Clinical Measurements: Goal: Will remain free from infection Outcome: Progressing   Problem: Nutrition: Goal: Adequate nutrition will be maintained Outcome: Progressing   Problem: Coping: Goal: Level of anxiety will decrease Outcome: Progressing   Problem: Elimination: Goal: Will not experience complications related to bowel motility Outcome: Progressing   Problem: Pain Managment: Goal: General experience of comfort will improve Outcome: Progressing   Problem: Education: Goal: Emotional status will improve Outcome: Progressing Goal: Mental status will improve Outcome: Progressing

## 2022-03-12 NOTE — Progress Notes (Signed)
[facility] MD Progress Note  03/12/2022 4:32 PM Rita Sullivan  MRN:  [id number] Subjective: Patient says she is feeling better today.  Denies hallucinations.  Denies feeling depressed.  Denies any suicidal or homicidal thought.  She has been less grumpy today and less agitated.  Smiling and appropriate in her conversation. Principal Problem: Schizoaffective disorder, bipolar type (HCC) Diagnosis: Principal Problem:   Schizoaffective disorder, bipolar type (HCC)  Total Time spent with patient: 30 minutes  Past Psychiatric History: Past history of schizoaffective disorder  Past Medical History:  Past Medical History:  Diagnosis Date   Bipolar disorder (HCC)    Chronic kidney disease    Diabetes mellitus without complication (HCC)    Hypercholesteremia    Manic behavior (HCC)     Past Surgical History:  Procedure Laterality Date   COLONOSCOPY WITH PROPOFOL N/A 03/11/2015   Procedure: COLONOSCOPY WITH PROPOFOL;  Surgeon: Wallace Cullens, MD;  Location: [facility] ENDOSCOPY;  Service: Gastroenterology;  Laterality: N/A;   Family History:  Family History  Problem Relation Age of Onset   Diabetes Father    Diabetes Sister    Family Psychiatric  History: See previous Social History:  Social History   Substance and Sexual Activity  Alcohol Use Yes   Alcohol/week: 2.0 standard drinks of alcohol   Types: 2 Glasses of wine per week   Comment: Occ     Social History   Substance and Sexual Activity  Drug Use No    Social History   Socioeconomic History   Marital status: Married    Spouse name: Not on file   Number of children: Not on file   Years of education: Not on file   Highest education level: Not on file  Occupational History   Not on file  Tobacco Use   Smoking status: Never   Smokeless tobacco: Never  Substance and Sexual Activity   Alcohol use: Yes    Alcohol/week: 2.0 standard drinks of alcohol    Types: 2 Glasses of wine per week    Comment: Occ   Drug use: No    Sexual activity: Never  Other Topics Concern   Not on file  Social History Narrative   Not on file   Social Determinants of Health   Financial Resource Strain: Not on file  Food Insecurity: No Food Insecurity (02/26/2022)   Hunger Vital Sign    Worried About Running Out of Food in the Last Year: Never true    Ran Out of Food in the Last Year: Never true  Transportation Needs: No Transportation Needs (02/26/2022)   PRAPARE - Administrator, Civil Service (Medical): No    Lack of Transportation (Non-Medical): No  Physical Activity: Not on file  Stress: Not on file  Social Connections: Not on file   Additional Social History:    Pain Medications: See PTA Prescriptions: See PTA Over the Counter: See PTA History of alcohol / drug use?: No history of alcohol / drug abuse Longest period of sobriety (when/how long): Unable to quantify                    Sleep: Fair  Appetite:  Negative  Current Medications: Current Facility-Administered Medications  Medication Dose Route Frequency Provider Last Rate Last Admin   acetaminophen (TYLENOL) tablet 650 mg  650 mg Oral Q6H PRN Vanetta Mulders, NP       alum & mag hydroxide-simeth (MAALOX/MYLANTA) 200-200-20 MG/5ML suspension 30 mL  30 mL Oral  Q4H PRN Sherlon Handing, NP       ARIPiprazole (ABILIFY) tablet 20 mg  20 mg Oral q morning Rashidah Belleville T, MD   20 mg at 03/12/22 1033   atorvastatin (LIPITOR) tablet 40 mg  40 mg Oral Daily Waldon Merl F, NP   40 mg at 03/12/22 1033   carbamazepine (TEGRETOL) tablet 400 mg  400 mg Oral TID Parks Ranger, DO   400 mg at 03/12/22 1615   cholecalciferol (VITAMIN D3) 25 MCG (1000 UNIT) tablet 2,000 Units  2,000 Units Oral Daily Waldon Merl F, NP   2,000 Units at 02/58/52 7782   folic acid (FOLVITE) tablet 0.5 mg  500 mcg Oral Daily Waldon Merl F, NP   0.5 mg at 03/12/22 1033   glipiZIDE (GLUCOTROL) tablet 5 mg  5 mg Oral Q breakfast Waldon Merl F,  NP   5 mg at 03/12/22 4235   insulin aspart (novoLOG) injection 0-15 Units  0-15 Units Subcutaneous TID WC Sherlon Handing, NP   3 Units at 03/12/22 1628   insulin aspart (novoLOG) injection 0-5 Units  0-5 Units Subcutaneous QHS Waldon Merl F, NP   3 Units at 03/11/22 2146   insulin glargine-yfgn (SEMGLEE) injection 10 Units  10 Units Subcutaneous QHS Parks Ranger, DO   10 Units at 03/11/22 2146   LORazepam (ATIVAN) tablet 1 mg  1 mg Oral Q6H PRN Parks Ranger, DO   1 mg at 03/11/22 0846   losartan (COZAAR) tablet 25 mg  25 mg Oral Daily Waldon Merl F, NP   25 mg at 03/12/22 1033   magnesium hydroxide (MILK OF MAGNESIA) suspension 30 mL  30 mL Oral Daily PRN Waldon Merl F, NP       metFORMIN (GLUCOPHAGE) tablet 1,000 mg  1,000 mg Oral BID WC Waldon Merl F, NP   1,000 mg at 03/12/22 1615   traZODone (DESYREL) tablet 100 mg  100 mg Oral QHS Waldon Merl F, NP   100 mg at 03/11/22 2145    Lab Results:  Results for orders placed or performed during the hospital encounter of 02/26/22 (from the past 48 hour(s))  Glucose, capillary     Status: Abnormal   Collection Time: 03/10/22  4:34 PM  Result Value Ref Range   Glucose-Capillary 193 (H) 70 - 99 mg/dL    Comment: Glucose reference range applies only to samples taken after fasting for at least 8 hours.  Glucose, capillary     Status: Abnormal   Collection Time: 03/10/22  8:26 PM  Result Value Ref Range   Glucose-Capillary 197 (H) 70 - 99 mg/dL    Comment: Glucose reference range applies only to samples taken after fasting for at least 8 hours.   Comment 1 Notify RN   Glucose, capillary     Status: Abnormal   Collection Time: 03/11/22  7:38 AM  Result Value Ref Range   Glucose-Capillary 160 (H) 70 - 99 mg/dL    Comment: Glucose reference range applies only to samples taken after fasting for at least 8 hours.  Glucose, capillary     Status: Abnormal   Collection Time: 03/11/22 11:30 AM  Result  Value Ref Range   Glucose-Capillary 247 (H) 70 - 99 mg/dL    Comment: Glucose reference range applies only to samples taken after fasting for at least 8 hours.  Glucose, capillary     Status: Abnormal   Collection Time: 03/11/22  4:13 PM  Result Value Ref Range  Glucose-Capillary 189 (H) 70 - 99 mg/dL    Comment: Glucose reference range applies only to samples taken after fasting for at least 8 hours.  Glucose, capillary     Status: Abnormal   Collection Time: 03/11/22  8:25 PM  Result Value Ref Range   Glucose-Capillary 255 (H) 70 - 99 mg/dL    Comment: Glucose reference range applies only to samples taken after fasting for at least 8 hours.   Comment 1 Notify RN   Glucose, capillary     Status: Abnormal   Collection Time: 03/12/22  7:36 AM  Result Value Ref Range   Glucose-Capillary 178 (H) 70 - 99 mg/dL    Comment: Glucose reference range applies only to samples taken after fasting for at least 8 hours.  Glucose, capillary     Status: Abnormal   Collection Time: 03/12/22 11:23 AM  Result Value Ref Range   Glucose-Capillary 266 (H) 70 - 99 mg/dL    Comment: Glucose reference range applies only to samples taken after fasting for at least 8 hours.  Glucose, capillary     Status: Abnormal   Collection Time: 03/12/22  4:14 PM  Result Value Ref Range   Glucose-Capillary 154 (H) 70 - 99 mg/dL    Comment: Glucose reference range applies only to samples taken after fasting for at least 8 hours.    Blood Alcohol level:  Lab Results  Component Value Date   ETH <10 02/26/2022   ETH <10 10/18/2021    Metabolic Disorder Labs: Lab Results  Component Value Date   HGBA1C 8.3 (H) 03/06/2022   MPG 191.51 03/06/2022   MPG 134.11 02/20/2017   Lab Results  Component Value Date   PROLACTIN 23.3 02/20/2017   PROLACTIN 3.6 (L) 07/31/2016   Lab Results  Component Value Date   CHOL 194 03/06/2022   TRIG 113 03/06/2022   HDL 63 03/06/2022   CHOLHDL 3.1 03/06/2022   VLDL 23 03/06/2022    LDLCALC 108 (H) 03/06/2022   LDLCALC 131 (H) 02/20/2017    Physical Findings: AIMS:  , ,  ,  ,    CIWA:    COWS:     Musculoskeletal: Strength & Muscle Tone: within normal limits Gait & Station: normal Patient leans: N/A  Psychiatric Specialty Exam:  Presentation  General Appearance:  Casual  Eye Contact: Fair  Speech: Clear and Coherent  Speech Volume: Normal  Handedness: Right   Mood and Affect  Mood: -- (Stated "good")  Affect: Congruent   Thought Process  Thought Processes: Coherent  Descriptions of Associations:Intact  Orientation:Full (Time, Place and Person)  Thought Content:WDL  History of Schizophrenia/Schizoaffective disorder:No  Duration of Psychotic Symptoms:Greater than six months  Hallucinations:No data recorded Ideas of Reference:None  Suicidal Thoughts:No data recorded Homicidal Thoughts:No data recorded  Sensorium  Memory: Immediate Good  Judgment: Impaired  Insight: Poor   Art therapist  Concentration: Fair  Attention Span: Fair  Recall: [employer] of Knowledge: Fair  Language: Fair   Psychomotor Activity  Psychomotor Activity:No data recorded  Assets  Assets: Resilience; Social Support; Health and safety inspector; Housing; Desire for Improvement   Sleep  Sleep:No data recorded   Physical Exam: Physical Exam Vitals and nursing note reviewed.  Constitutional:      Appearance: Normal appearance.  HENT:     Head: Normocephalic and atraumatic.     Mouth/Throat:     Pharynx: Oropharynx is clear.  Eyes:     Pupils: Pupils are equal, round, and reactive to light.  Cardiovascular:     Rate and Rhythm: Normal rate and regular rhythm.  Pulmonary:     Effort: Pulmonary effort is normal.     Breath sounds: Normal breath sounds.  Abdominal:     General: Abdomen is flat.     Palpations: Abdomen is soft.  Musculoskeletal:        General: Normal range of motion.  Skin:    General:  Skin is warm and dry.  Neurological:     General: No focal deficit present.     Mental Status: She is alert. Mental status is at baseline.  Psychiatric:        Attention and Perception: Attention normal.        Mood and Affect: Mood normal.        Speech: Speech normal.        Behavior: Behavior normal.        Thought Content: Thought content normal.        Cognition and Memory: Cognition normal.    Review of Systems  Constitutional: Negative.   HENT: Negative.    Eyes: Negative.   Respiratory: Negative.    Cardiovascular: Negative.   Gastrointestinal: Negative.   Musculoskeletal: Negative.   Skin: Negative.   Neurological: Negative.   Psychiatric/Behavioral: Negative.     Blood pressure 123/77, pulse (!) 106, temperature 98.8 F (37.1 C), temperature source Oral, resp. rate 18, height 5\' 2"  (1.575 m), weight 76.4 kg, SpO2 99 %. Body mass index is 30.82 kg/m.   Treatment Plan Summary: Medication management and Plan really seems to be doing quite a bit better.  Pleasant today and denies depression.  Acute depression seems to be resolved and the irritability under better control.  I am recommending discharge from the hospital tomorrow.  Treatment team will follow-up with family.  , MD 03/12/2022, 4:32 PM

## 2022-03-12 NOTE — Group Note (Signed)
[facility] LCSW Group Therapy Note   Group Date: 03/12/2022 Start Time: 1300 End Time: 1400   Type of Therapy/Topic:  Group Therapy:  Balance in Life  Participation Level:  None   Description of Group:    This group will address the concept of balance and how it feels and looks when one is unbalanced. Patients will be encouraged to process areas in their lives that are out of balance, and identify reasons for remaining unbalanced. Facilitators will guide patients utilizing problem- solving interventions to address and correct the stressor making their life unbalanced. Understanding and applying boundaries will be explored and addressed for obtaining  and maintaining a balanced life. Patients will be encouraged to explore ways to assertively make their unbalanced needs known to significant others in their lives, using other group members and facilitator for support and feedback.  Therapeutic Goals: Patient will identify two or more emotions or situations they have that consume much of in their lives. Patient will identify signs/triggers that life has become out of balance:  Patient will identify two ways to set boundaries in order to achieve balance in their lives:  Patient will demonstrate ability to communicate their needs through discussion and/or role plays  Summary of Patient Progress: Patient was present in group at the beginning, however, she left early and d did not engage in group discussion.    Therapeutic Modalities:   Cognitive Behavioral Therapy Solution-Focused Therapy Assertiveness Training   [name], LCSW

## 2022-03-12 NOTE — B[facility] Suicide Risk Assessment (Signed)
[facility] Discharge Suicide Risk Assessment   Principal Problem: Schizoaffective disorder, bipolar type ([place]) Discharge Diagnoses: Principal Problem:   Schizoaffective disorder, bipolar type ([place])   Total Time spent with patient: 30 minutes  Musculoskeletal: Strength & Muscle Tone: within normal limits Gait & Station: normal Patient leans: N/A  Psychiatric Specialty Exam  Presentation  General Appearance:  Casual  Eye Contact: Fair  Speech: Clear and Coherent  Speech Volume: Normal  Handedness: Right   Mood and Affect  Mood: -- (Stated "good")  Duration of Depression Symptoms: Greater than two weeks  Affect: Congruent   Thought Process  Thought Processes: Coherent  Descriptions of Associations:Intact  Orientation:Full (Time, Place and Person)  Thought Content:WDL  History of Schizophrenia/Schizoaffective disorder:No  Duration of Psychotic Symptoms:Greater than six months  Hallucinations:No data recorded Ideas of Reference:None  Suicidal Thoughts:No data recorded Homicidal Thoughts:No data recorded  Sensorium  Memory: Immediate Good  Judgment: Impaired  Insight: Poor   Community education officer  Concentration: Fair  Attention Span: Fair  Recall: [employer] of Knowledge: Fair  Language: Fair   Psychomotor Activity  Psychomotor Activity:No data recorded  Assets  Assets: Resilience; Social Support; Catering manager; Housing; Desire for Improvement   Sleep  Sleep:No data recorded  Physical Exam: Physical Exam Vitals and nursing note reviewed.  Constitutional:      Appearance: Normal appearance.  HENT:     Head: Normocephalic and atraumatic.     Mouth/Throat:     Pharynx: Oropharynx is clear.  Eyes:     Pupils: Pupils are equal, round, and reactive to light.  Cardiovascular:     Rate and Rhythm: Normal rate and regular rhythm.  Pulmonary:     Effort: Pulmonary effort is normal.     Breath sounds: Normal  breath sounds.  Abdominal:     General: Abdomen is flat.     Palpations: Abdomen is soft.  Musculoskeletal:        General: Normal range of motion.  Skin:    General: Skin is warm and dry.  Neurological:     General: No focal deficit present.     Mental Status: She is alert. Mental status is at baseline.  Psychiatric:        Attention and Perception: Attention normal.        Mood and Affect: Mood normal.        Speech: Speech normal.        Behavior: Behavior normal.        Thought Content: Thought content normal.        Cognition and Memory: Cognition normal.    Review of Systems  Constitutional: Negative.   HENT: Negative.    Eyes: Negative.   Respiratory: Negative.    Cardiovascular: Negative.   Gastrointestinal: Negative.   Musculoskeletal: Negative.   Skin: Negative.   Neurological: Negative.   Psychiatric/Behavioral: Negative.     Blood pressure 123/77, pulse (!) 106, temperature 98.8 F (37.1 C), temperature source Oral, resp. rate 18, height 5\' 2"  (1.575 m), weight 76.4 kg, SpO2 99 %. Body mass index is 30.82 kg/m.  Mental Status Per Nursing Assessment::   On Admission:  NA  Demographic Factors:  Caucasian  Loss Factors: NA  Historical Factors: NA  Risk Reduction Factors:   Living with another person, especially a relative and Positive social support  Continued Clinical Symptoms:  Schizophrenia:   Paranoid or undifferentiated type  Cognitive Features That Contribute To Risk:  None    Suicide Risk:  Minimal: No identifiable  suicidal ideation.  Patients presenting with no risk factors but with morbid ruminations; may be classified as minimal risk based on the severity of the depressive symptoms   Follow-up [place], [place], [place], [place] Follow up.   Why: Follow up appointment is scheduled for 03/18/2022 at 1:40PM with Almyra Free.  Please bring updated insurance and call ahead if changes to  appointment.  Appointment is IN PERSON. Contact information: Phone (appointments): [phone]  Phone (general inquiries): [phone] Address: [address], [place], [place] [address]                Plan Of Care/Follow-up recommendations:  Other:  Patient is pleasant and calm and denies any suicidal thought at all.  Has not shown any violence or aggression or made any suicide attempts.  Seems lucid and feeling better.  Has appropriate outpatient treatment arranged in a safe place to live  Alethia Berthold, MD 03/12/2022, 4:36 PM

## 2022-03-13 LAB — GLUCOSE, CAPILLARY
Glucose-Capillary: 177 mg/dL — ABNORMAL HIGH (ref 70–99)
Glucose-Capillary: 216 mg/dL — ABNORMAL HIGH (ref 70–99)

## 2022-03-13 MED ORDER — INSULIN GLARGINE-YFGN 100 UNIT/ML ~~LOC~~ SOLN: 10.0000 [IU] | Freq: Every day | SUBCUTANEOUS | 1 refills | Status: DC

## 2022-03-13 MED ORDER — TRAZODONE HCL 50 MG PO TABS
50.0000 mg | ORAL_TABLET | Freq: Every evening | ORAL | Status: DC | PRN
  Administered 2022-03-13: 50 mg via ORAL
  Filled 2022-03-13: qty 1

## 2022-03-13 MED ORDER — LOSARTAN POTASSIUM 25 MG PO TABS: 25.0000 mg | ORAL_TABLET | Freq: Every day | ORAL | 1 refills | Status: AC

## 2022-03-13 MED ORDER — FOLIC ACID 1 MG PO TABS: 500.0000 ug | ORAL_TABLET | Freq: Every day | ORAL | 1 refills | Status: AC

## 2022-03-13 MED ORDER — ARIPIPRAZOLE 20 MG PO TABS: 20.0000 mg | ORAL_TABLET | Freq: Every morning | ORAL | 1 refills | Status: DC

## 2022-03-13 MED ORDER — CARBAMAZEPINE 200 MG PO TABS: 400.0000 mg | ORAL_TABLET | Freq: Three times a day (TID) | ORAL | 1 refills | Status: DC

## 2022-03-13 MED ORDER — METFORMIN HCL 1000 MG PO TABS: 1000.0000 mg | ORAL_TABLET | Freq: Two times a day (BID) | ORAL | 1 refills | Status: DC

## 2022-03-13 MED ORDER — TRAZODONE HCL 100 MG PO TABS: 100.0000 mg | ORAL_TABLET | Freq: Every day | ORAL | 1 refills | Status: AC

## 2022-03-13 MED ORDER — ATORVASTATIN CALCIUM 40 MG PO TABS: 40.0000 mg | ORAL_TABLET | Freq: Every day | ORAL | 1 refills | Status: AC

## 2022-03-13 MED ORDER — VITAMIN D 25 MCG (1000 UNIT) PO TABS: 2000.0000 [IU] | ORAL_TABLET | Freq: Every day | ORAL | 1 refills | Status: AC

## 2022-03-13 MED ORDER — GLIPIZIDE 5 MG PO TABS: 5.0000 mg | ORAL_TABLET | Freq: Every day | ORAL | 1 refills | Status: DC

## 2022-03-13 NOTE — Progress Notes (Signed)
Patient at the nurse's station saying that she can't sleep. Denies anxiety, just insomnia. Provider notified.

## 2022-03-13 NOTE — Progress Notes (Signed)
Pt was educated on prescriptions and follow up care. Pt questions were answered and pt verbalized understanding and did not voice any concerns. Pt's belongings were returned. [facility]. ° °

## 2022-03-13 NOTE — Care Management Important Message (Signed)
Important Message  Patient Details  Name: Rita Sullivan MRN: [id number] Date of Birth: 1955/08/08   Medicare Important Message Given:  Yes     [name], LCSW 03/13/2022, 10:42 AM

## 2022-03-13 NOTE — Progress Notes (Signed)
Patient denies SI, HI, and AVH. She denies pain and other physical problems. Patient is compliant with scheduled medications. Patient remains safe on the unit at this time.

## 2022-03-13 NOTE — Progress Notes (Signed)
  [facility] Adult Case Management Discharge Plan :  Will you be returning to the same living situation after discharge:  Yes,  pt is returning home. At discharge, do you have transportation home?: Yes,  pt reports that her husband will provide transportation. Do you have the ability to pay for your medications: Yes AETNA MEDICARE / AETNA MEDICARE HMO/PPO  Release of information consent forms completed and in the chart;  Patient's signature needed at discharge.  Patient to Follow up at:  [place], [place], [place], [place] Follow up.   Why: Follow up appointment is scheduled for 03/18/2022 at 1:40PM with Almyra Free.  Please bring updated insurance and call ahead if changes to appointment.  Appointment is IN PERSON. Contact information: Phone (appointments): [phone]  Phone (general inquiries): [phone] Address: [address] [place], [place] [address]                Next level of care provider has access to [place] and Suicide Prevention discussed: Yes,  SPE completed with the patient husband.      Has patient been referred to the Quitline?: N/A patient is not a smoker  Patient has been referred for addiction treatment: [place], LCSW 03/13/2022, 11:03 AM

## 2022-03-13 NOTE — Discharge Summary (Signed)
Physician Discharge Summary Note  Patient:  Rita Sullivan is an 66 y.o., female MRN:  [id number] DOB:  Sep 27, 1955 Patient phone:  [phone] (home)  Patient address:   [place] [address],  Total Time spent with patient: 30 minutes  Date of Admission:  02/26/2022 Date of Discharge: 03/13/2022  Reason for Admission: Patient with a history of schizoaffective disorder was admitted because of worsening manic symptoms with psychotic features  Principal Problem: Schizoaffective disorder, bipolar type [facility]) Discharge Diagnoses: Principal Problem:   Schizoaffective disorder, bipolar type ([place])   Past Psychiatric History: Past history of bipolar disorder recurrent episodes both with depressive and manic features  Past Medical History:  Past Medical History:  Diagnosis Date   Bipolar disorder ([place])    Chronic kidney disease    Diabetes mellitus without complication ([place])    Hypercholesteremia    Manic behavior ([place])     Past Surgical History:  Procedure Laterality Date   COLONOSCOPY WITH PROPOFOL N/A 03/11/2015   Procedure: COLONOSCOPY WITH PROPOFOL;  Surgeon: Hulen Luster, MD;  Location: [facility] ENDOSCOPY;  Service: Gastroenterology;  Laterality: N/A;   Family History:  Family History  Problem Relation Age of Onset   Diabetes Father    Diabetes Sister    Family Psychiatric  History: See previous Social History:  Social History   Substance and Sexual Activity  Alcohol Use Yes   Alcohol/week: 2.0 standard drinks of alcohol   Types: 2 Glasses of wine per week   Comment: Occ     Social History   Substance and Sexual Activity  Drug Use No    Social History   Socioeconomic History   Marital status: Married    Spouse name: Not on file   Number of children: Not on file   Years of education: Not on file   Highest education level: Not on file  Occupational History   Not on file  Tobacco Use   Smoking status: Never   Smokeless tobacco: Never   Substance and Sexual Activity   Alcohol use: Yes    Alcohol/week: 2.0 standard drinks of alcohol    Types: 2 Glasses of wine per week    Comment: Occ   Drug use: No   Sexual activity: Never  Other Topics Concern   Not on file  Social History Narrative   Not on file   Social Determinants of Health   Financial Resource Strain: Not on file  Food Insecurity: No Food Insecurity (02/26/2022)   Hunger Vital Sign    Worried About Running Out of Food in the Last Year: Never true    Ran Out of Food in the Last Year: Never true  Transportation Needs: No Transportation Needs (02/26/2022)   PRAPARE - Hydrologist (Medical): No    Lack of Transportation (Non-Medical): No  Physical Activity: Not on file  Stress: Not on file  Social Connections: Not on file    Hospital Course: Admitted to psychiatric hospital.  15-minute checks continued.  Patient was prominently showing psychotic symptoms and somewhat disorganized early on.  Cooperative generally with medication and treatment.  Gradually showed improvement with better thought organization and less irritable behavior.  By the last several days of hospitalization was denying psychotic symptoms denying irritability denying suicidal ideation.  Cooperative with medicine.  At the time of discharge was taking care of her ADLs neatly sleeping well and very pleasant.  Husband spoke with her and agreed that she seemed appropriate for  discharge.  Patient discharged on medication with outpatient follow-up.  Physical Findings: AIMS:  , ,  ,  ,    CIWA:    COWS:     Musculoskeletal: Strength & Muscle Tone: within normal limits Gait & Station: normal Patient leans: N/A   Psychiatric Specialty Exam:  Presentation  General Appearance:  Casual  Eye Contact: Fair  Speech: Clear and Coherent  Speech Volume: Normal  Handedness: Right   Mood and Affect  Mood: -- (Stated "good")  Affect: Congruent   Thought  Process  Thought Processes: Coherent  Descriptions of Associations:Intact  Orientation:Full (Time, Place and Person)  Thought Content:WDL  History of Schizophrenia/Schizoaffective disorder:No  Duration of Psychotic Symptoms:Greater than six months  Hallucinations:No data recorded Ideas of Reference:None  Suicidal Thoughts:No data recorded Homicidal Thoughts:No data recorded  Sensorium  Memory: Immediate Good  Judgment: Impaired  Insight: Poor   Executive Functions  Concentration: Fair  Attention Span: Fair  Recall: [employer] of Knowledge: Fair  Language: Fair   Psychomotor Activity  Psychomotor Activity:No data recorded  Assets  Assets: Resilience; Social Support; Health and safety inspector; Housing; Desire for Improvement   Sleep  Sleep:No data recorded   Physical Exam: Physical Exam Vitals and nursing note reviewed.  Constitutional:      Appearance: Normal appearance.  HENT:     Head: Normocephalic and atraumatic.     Mouth/Throat:     Pharynx: Oropharynx is clear.  Eyes:     Pupils: Pupils are equal, round, and reactive to light.  Cardiovascular:     Rate and Rhythm: Normal rate and regular rhythm.  Pulmonary:     Effort: Pulmonary effort is normal.     Breath sounds: Normal breath sounds.  Abdominal:     General: Abdomen is flat.     Palpations: Abdomen is soft.  Musculoskeletal:        General: Normal range of motion.  Skin:    General: Skin is warm and dry.  Neurological:     General: No focal deficit present.     Mental Status: She is alert. Mental status is at baseline.  Psychiatric:        Attention and Perception: Attention normal.        Mood and Affect: Mood normal.        Speech: Speech normal.        Behavior: Behavior normal.        Thought Content: Thought content normal.        Cognition and Memory: Cognition normal.    Review of Systems  Constitutional: Negative.   HENT: Negative.    Eyes:  Negative.   Respiratory: Negative.    Cardiovascular: Negative.   Gastrointestinal: Negative.   Musculoskeletal: Negative.   Skin: Negative.   Neurological: Negative.   Psychiatric/Behavioral: Negative.     Blood pressure 110/71, pulse 94, temperature 97.7 F (36.5 C), resp. rate 18, height 5\' 2"  (1.575 m), weight 76.4 kg, SpO2 100 %. Body mass index is 30.82 kg/m.   Social History   Tobacco Use  Smoking Status Never  Smokeless Tobacco Never   Tobacco Cessation:  N/A, patient does not currently use tobacco products   Blood Alcohol level:  Lab Results  Component Value Date   ETH <10 02/26/2022   ETH <10 10/18/2021    Metabolic Disorder Labs:  Lab Results  Component Value Date   HGBA1C 8.3 (H) 03/06/2022   MPG 191.51 03/06/2022   MPG 134.11 02/20/2017   Lab Results  Component Value Date   PROLACTIN 23.3 02/20/2017   PROLACTIN 3.6 (L) 07/31/2016   Lab Results  Component Value Date   CHOL 194 03/06/2022   TRIG 113 03/06/2022   HDL 63 03/06/2022   CHOLHDL 3.1 03/06/2022   VLDL 23 03/06/2022   LDLCALC 108 (H) 03/06/2022   LDLCALC 131 (H) 02/20/2017    See Psychiatric Specialty Exam and Suicide Risk Assessment completed by Attending Physician prior to discharge.  Discharge destination:  Home  Is patient on multiple antipsychotic therapies at discharge:  No   Has Patient had three or more failed trials of antipsychotic monotherapy by history:  No  Recommended Plan for Multiple Antipsychotic Therapies: NA  Discharge Instructions     Diet - low sodium heart healthy   Complete by: As directed    Increase activity slowly   Complete by: As directed       Allergies as of 03/13/2022       Reactions   Beef-derived Products Other (See Comments)   Does not eat beef  Update: Pt states that she does eat beef   Divalproex Sodium Other (See Comments)   "bad for my liver"   Pork-derived Products Other (See Comments)   Does not eat pork Patient states that  she does eat pork   Penicillins Rash        Medication List     STOP taking these medications    Hair Skin and Nails Formula Tabs   Jardiance 10 MG Tabs tablet Generic drug: empagliflozin   zinc sulfate 220 (50 Zn) MG capsule       TAKE these medications      Indication  ARIPiprazole 20 MG tablet Commonly known as: ABILIFY Take 1 tablet (20 mg total) by mouth every morning. Start taking on: March 14, 2022 What changed:  medication strength how much to take  Indication: MIXED BIPOLAR AFFECTIVE DISORDER   atorvastatin 40 MG tablet Commonly known as: LIPITOR Take 1 tablet (40 mg total) by mouth daily.  Indication: High Amount of Fats in the Blood   carbamazepine 200 MG tablet Commonly known as: TEGRETOL Take 2 tablets (400 mg total) by mouth 3 (three) times daily. What changed: when to take this  Indication: Manic-Depression   cholecalciferol 25 MCG (1000 UNIT) tablet Commonly known as: VITAMIN D3 Take 2 tablets (2,000 Units total) by mouth daily.  Indication: Vitamin D Deficiency   folic acid 1 MG tablet Commonly known as: FOLVITE Take 0.5 tablets (0.5 mg total) by mouth daily. Start taking on: March 14, 2022 What changed:  medication strength how much to take  Indication: Anemia From Inadequate Folic Acid   glipiZIDE 5 MG tablet Commonly known as: GLUCOTROL Take 1 tablet (5 mg total) by mouth daily with breakfast. Start taking on: March 14, 2022 What changed: when to take this  Indication: Type 2 Diabetes   insulin glargine-yfgn 100 UNIT/ML injection Commonly known as: SEMGLEE Inject 0.1 mLs (10 Units total) into the skin at bedtime.  Indication: Type 2 Diabetes   losartan 25 MG tablet Commonly known as: COZAAR Take 1 tablet (25 mg total) by mouth daily.  Indication: High Blood Pressure Disorder   metFORMIN 1000 MG tablet Commonly known as: GLUCOPHAGE Take 1 tablet (1,000 mg total) by mouth 2 (two) times daily with a meal. What  changed:  medication strength how much to take  Indication: Type 2 Diabetes   traZODone 100 MG tablet Commonly known as: DESYREL Take 1 tablet (100 mg total)  by mouth at bedtime.  Indication: Trouble Sleeping        Follow-up Information     [facility], [place], [place], [place] Follow up.   Why: Follow up appointment is scheduled for 03/18/2022 at 1:40PM with Raynelle Fanning.  Please bring updated insurance and call ahead if changes to appointment.  Appointment is IN PERSON. Contact information: Phone (appointments): [phone]  Phone (general inquiries): [phone] Address: [address] [place], [place] [address]                Follow-up recommendations:  Other:  [facility]up with [facility].  Prescriptions provided and sent to pharmacy.  Comments: See note above  Signed: Mordecai Rasmussen, MD 03/13/2022, 3:24 PM

## 2022-05-04 ENCOUNTER — Telehealth: Payer: Self-pay

## 2022-05-04 NOTE — Telephone Encounter (Signed)
  Fax received from [facility] pharmacy requesting a 90 day supply of the following medication please advise         Disp Refills Start End    metFORMIN (GLUCOPHAGE) 1000 MG tablet 60 tablet 1 03/13/2022    Sig - Route: Take 1 tablet (1,000 mg total) by mouth 2 (two) times daily with a meal. - Oral   Sent to pharmacy as: metFORMIN (GLUCOPHAGE) 1000 MG tablet   E-Prescribing Status: Receipt confirmed by pharmacy (03/13/2022 10:50 AM EDT)

## 2023-09-12 ENCOUNTER — Encounter: Payer: Self-pay | Admitting: Emergency Medicine

## 2023-09-12 ENCOUNTER — Other Ambulatory Visit: Payer: Self-pay

## 2023-09-12 ENCOUNTER — Emergency Department
Admission: EM | Admit: 2023-09-12 | Discharge: 2023-09-14 | Disposition: A | Discharge status: Other Acute Inpatient Hospital | Attending: Emergency Medicine | Admitting: Emergency Medicine

## 2023-09-12 DIAGNOSIS — R440 Auditory hallucinations: Secondary | ICD-10-CM

## 2023-09-12 DIAGNOSIS — R443 Hallucinations, unspecified: Secondary | ICD-10-CM | POA: Diagnosis present

## 2023-09-12 DIAGNOSIS — R45851 Suicidal ideations: Secondary | ICD-10-CM | POA: Diagnosis not present

## 2023-09-12 DIAGNOSIS — N189 Chronic kidney disease, unspecified: Secondary | ICD-10-CM | POA: Diagnosis not present

## 2023-09-12 DIAGNOSIS — F250 Schizoaffective disorder, bipolar type: Secondary | ICD-10-CM | POA: Insufficient documentation

## 2023-09-12 DIAGNOSIS — E1122 Type 2 diabetes mellitus with diabetic chronic kidney disease: Secondary | ICD-10-CM | POA: Insufficient documentation

## 2023-09-12 LAB — ETHANOL: Alcohol, Ethyl (B): <10 mg/dL (ref <10)

## 2023-09-12 LAB — SALICYLATE LEVEL: Salicylate Lvl: <7 mg/dL — ABNORMAL LOW (ref 7.0–30.0)

## 2023-09-12 LAB — URINE DRUG SCREEN, QUALITATIVE (ARMC ONLY)
Amphetamines, Ur Screen: NOT DETECTED
Barbiturates, Ur Screen: NOT DETECTED
Benzodiazepine, Ur Scrn: NOT DETECTED
Cannabinoid 50 Ng, Ur ~~LOC~~: NOT DETECTED
Cocaine Metabolite,Ur ~~LOC~~: NOT DETECTED
MDMA (Ecstasy)Ur Screen: NOT DETECTED
Methadone Scn, Ur: NOT DETECTED
Opiate, Ur Screen: NOT DETECTED
Phencyclidine (PCP) Ur S: NOT DETECTED
Tricyclic, Ur Screen: NOT DETECTED

## 2023-09-12 LAB — CBC
HCT: 43 % (ref 36.0–46.0)
Hemoglobin: 14.5 g/dL (ref 12.0–15.0)
MCH: 31.3 pg (ref 26.0–34.0)
MCHC: 33.7 g/dL (ref 30.0–36.0)
MCV: 92.9 fL (ref 80.0–100.0)
Platelets: 385 10*3/uL (ref 150–400)
RBC: 4.63 MIL/uL (ref 3.87–5.11)
RDW: 13.1 % (ref 11.5–15.5)
WBC: 13.8 10*3/uL — ABNORMAL HIGH (ref 4.0–10.5)
nRBC: 0 % (ref 0.0–0.2)

## 2023-09-12 LAB — ACETAMINOPHEN LEVEL: Acetaminophen (Tylenol), Serum: <10 ug/mL — ABNORMAL LOW (ref 10–30)

## 2023-09-12 LAB — COMPREHENSIVE METABOLIC PANEL WITH GFR
ALT: 21 U/L (ref 0–44)
AST: 23 U/L (ref 15–41)
Albumin: 4.8 g/dL (ref 3.5–5.0)
Alkaline Phosphatase: 134 U/L — ABNORMAL HIGH (ref 38–126)
Anion gap: 12 (ref 5–15)
BUN: 14 mg/dL (ref 8–23)
CO2: 19 mmol/L — ABNORMAL LOW (ref 22–32)
Calcium: 10 mg/dL (ref 8.9–10.3)
Chloride: 101 mmol/L (ref 98–111)
Creatinine, Ser: 0.77 mg/dL (ref 0.44–1.00)
GFR, Estimated: >60 mL/min (ref >60)
Glucose, Bld: 256 mg/dL — ABNORMAL HIGH (ref 70–99)
Potassium: 4 mmol/L (ref 3.5–5.1)
Sodium: 132 mmol/L — ABNORMAL LOW (ref 135–145)
Total Bilirubin: 0.5 mg/dL (ref 0.0–1.2)
Total Protein: 7.9 g/dL (ref 6.5–8.1)

## 2023-09-12 NOTE — ED Notes (Signed)
  Patient dressed out in paper scrubs and belongings placed in bag.  Husband given patient belongings.

## 2023-09-12 NOTE — ED Triage Notes (Signed)
  Patient BIB husband for psychiatric issues.  Patient states she feels manic and has been hearing voices telling her to do stuff for about 2 weeks.  Patient states voices have been telling her to not take her medication and to hurt herself.  Patient denies any SI/HI.  Denies any ETOH or drug use.  Denies any pain at this time.  Hx bipolar disorder.

## 2023-09-12 NOTE — ED Notes (Signed)
 IVC/Psych Consult ordered/Pending/Legal paper work completed

## 2023-09-12 NOTE — ED Provider Notes (Signed)
 [facility] Provider Note    Event Date/Time   First MD Initiated Contact with Patient 09/12/23 2011     (approximate)   History   Medical Clearance   HPI  Rita Sullivan is a 68 year old female with history of schizoaffective disorder presenting with hallucinations.  Patient reports for the past few weeks she has had worsening command hallucinations.  They have told her not to take her medication and to kill herself.  No specific plan.  Denies any pain currently.  Unsure if she has had similar symptoms in the past.     Physical Exam   Triage Vital Signs: ED Triage Vitals  Encounter Vitals Group     BP 09/12/23 2000 (!) 153/102     Systolic BP Percentile --      Diastolic BP Percentile --      Pulse Rate 09/12/23 2000 (!) 126     Resp 09/12/23 2000 20     Temp 09/12/23 2000 98.6 F (37 C)     Temp Source 09/12/23 2000 Oral     SpO2 09/12/23 2000 97 %     Weight 09/12/23 2001 164 lb (74.4 kg)     Height 09/12/23 2001 5\' 2"  (1.575 m)     Head Circumference --      Peak Flow --      Pain Score 09/12/23 2001 0     Pain Loc --      Pain Education --      Exclude from Growth Chart --     Most recent vital signs: Vitals:   09/12/23 2000  BP: (!) 153/102  Pulse: (!) 126  Resp: 20  Temp: 98.6 F (37 C)  SpO2: 97%     General: Awake, interactive  CV:  Tachycardic, good peripheral perfusion.  Resp:  Unlabored respirations.  Abd:  Nondistended.  Neuro:  Symmetric facial movement, fluid speech   ED Results / Procedures / Treatments   Labs (all labs ordered are listed, but only abnormal results are displayed) Labs Reviewed  COMPREHENSIVE METABOLIC PANEL WITH GFR - Abnormal; Notable for the following components:      Result Value   Sodium 132 (*)    CO2 19 (*)    Glucose, Bld 256 (*)    Alkaline Phosphatase 134 (*)    All other components within normal limits  CBC - Abnormal; Notable for the following components:   WBC 13.8 (*)     All other components within normal limits  ACETAMINOPHEN  LEVEL - Abnormal; Notable for the following components:   Acetaminophen  (Tylenol ), Serum <10 (*)    All other components within normal limits  SALICYLATE LEVEL - Abnormal; Notable for the following components:   Salicylate Lvl <7.0 (*)    All other components within normal limits  URINE DRUG SCREEN, QUALITATIVE ([facility] ONLY)  ETHANOL     EKG EKG independently reviewed interpreted by myself (ER attending) demonstrates:    RADIOLOGY Imaging independently reviewed and interpreted by myself demonstrates:   Formal Radiology Read:  No results found.  PROCEDURES:  Critical Care performed: No  Procedures   MEDICATIONS ORDERED IN ED: Medications - No data to display   IMPRESSION / MDM / ASSESSMENT AND PLAN / ED COURSE  I reviewed the triage vital signs and the nursing notes.  Differential diagnosis includes, but is not limited to, primary psychiatric disorder, substance-induced mood disorder, acute stress response  Patient's presentation is most consistent with acute presentation with potential threat  to life or bodily function.  68 year old presenting with command hallucinations including voices telling her to kill herself and not take her medication.  Psychiatry and TTS consulted.  Will place under IVC.  The patient has been placed in psychiatric observation due to the need to provide a safe environment for the patient while obtaining psychiatric consultation and evaluation, as well as ongoing medical and medication management to treat the patient's condition.  The patient has been placed under full IVC at this time.       FINAL CLINICAL IMPRESSION(S) / ED DIAGNOSES   Final diagnoses:  Auditory hallucination     Rx / DC Orders   ED Discharge Orders     None        Note:  This document was prepared using Dragon voice recognition software and may include unintentional dictation errors.   Claria Crofts,  MD 09/12/23 2250

## 2023-09-13 ENCOUNTER — Encounter: Payer: Self-pay | Admitting: Psychiatry

## 2023-09-13 DIAGNOSIS — R45851 Suicidal ideations: Secondary | ICD-10-CM | POA: Diagnosis not present

## 2023-09-13 DIAGNOSIS — F250 Schizoaffective disorder, bipolar type: Secondary | ICD-10-CM

## 2023-09-13 DIAGNOSIS — R440 Auditory hallucinations: Secondary | ICD-10-CM

## 2023-09-13 LAB — CARBAMAZEPINE LEVEL, TOTAL: Carbamazepine Lvl: 10.9 ug/mL (ref 4.0–12.0)

## 2023-09-13 MED ORDER — ARIPIPRAZOLE 10 MG PO TABS
20.0000 mg | ORAL_TABLET | Freq: Every day | ORAL | Status: DC
  Administered 2023-09-13: 20 mg via ORAL
  Filled 2023-09-13: qty 2

## 2023-09-13 MED ORDER — TRAZODONE HCL 100 MG PO TABS: 100.0000 mg | ORAL_TABLET | Freq: Every day | ORAL | Status: DC

## 2023-09-13 NOTE — ED Notes (Signed)
 Pt ambulated to bathroom to preform ADL's no assistance required

## 2023-09-13 NOTE — ED Notes (Signed)
 Hospital meal provided, pt tolerated w/o complaints.  Waste discarded appropriately.

## 2023-09-13 NOTE — ED Provider Notes (Signed)
 Emergency Medicine Observation Re-evaluation Note  Rita Sullivan is a 68 y.o. female, seen on rounds today.  Pt initially presented to the ED for complaints of Medical Clearance  Currently, the patient is is no acute distress. Denies any concerns at this time.  Physical Exam  Blood pressure 113/79, pulse (!) 126, temperature 98.6 F (37 C), temperature source Oral, resp. rate 20, height 5\' 2"  (1.575 m), weight 74.4 kg, SpO2 97%.  Physical Exam: General: No apparent distress Pulm: Normal WOB Neuro: Moving all extremities Psych: Resting comfortably     ED Course / MDM     I have reviewed the labs performed to date as well as medications administered while in observation.  Recent changes in the last 24 hours include: No acute events overnight.  Plan   Current plan: Patient awaiting psychiatric disposition. Patient is under full IVC at this time.    Cortny Bambach, Clover Dao, DO 09/13/23 [phone]

## 2023-09-13 NOTE — ED Notes (Signed)
 IVC/in-pt psych admit

## 2023-09-13 NOTE — ED Notes (Signed)
Pt ask for ice water, provided.

## 2023-09-13 NOTE — ED Notes (Signed)
 Pt given snack.

## 2023-09-13 NOTE — Consult Note (Signed)
 [facility] Consult Note  Patient Name: Rita Sullivan MRN: [id number] DOB: 1956/03/01 DATE OF Consult: 09/13/2023  PRIMARY PSYCHIATRIC DIAGNOSES  1.  Schizoaffective D/O, bipolar type 2.  SI   RECOMMENDATIONS  Inpt psych admission recommended:    [x] YES       []  NO   If yes:       [x]   Pt meets involuntary commitment criteria if not voluntary       []    Pt does not meet involuntary commitment criteria and must be         voluntary. If patient is not voluntary, then discharge is recommended.   Medication recommendations:   Agree with continuation of home medications Aripiprazole  20mg  po daily for psychosis Recommend hold carbamazepine   until obtain level to determine reliability of compliance, may need to titrate back on dose Trazodone  100mg  po bedtime for mood/sleep  Non-Medication recommendations:  obtain tegretol  level   Communication: Treatment team members (and family members if applicable) who were involved in treatment/care discussions and planning, and with whom we spoke or engaged with via secure text/chat, include the following: epic chat Nurse Carroll Clamp   I have discussed my assessment and treatment recommendations with the patient. Possible medication side effects/risks/benefits of current regimen.   Importance of medication adherence for medication to be beneficial.   Follow-Up Telepsychiatry C/L services:            []  We will continue to follow this patient with you.             [x]  Will sign off for now. Please re-consult our service as necessary.  Thank you for involving us  in the care of this patient. If you have any additional questions or concerns, please call [phone] and ask for me or the provider on-call.  TELEPSYCHIATRY ATTESTATION & CONSENT  As the provider for this telehealth consult, I attest that I verified the patient's identity using two separate identifiers, introduced myself to the patient, provided my credentials,  disclosed my location, and performed this encounter via a HIPAA-compliant, real-time, face-to-face, two-way, interactive audio and video platform and with the full consent and agreement of the patient (or guardian as applicable.)  Patient physical location: [place] [facility]. Telehealth provider physical location: home office in state of [place]  Video start time: 05:45am  (Central Time) Video end time: 06:04am (Central Time)  IDENTIFYING DATA  Rita Sullivan is a 68 y.o. year-old female for whom a psychiatric consultation has been ordered by the primary provider. The patient was identified using two separate identifiers.  CHIEF COMPLAINT/REASON FOR CONSULT  "I felt like I was getting manic and hearing voices, throwing pills in the toilet everyday".    HISTORY OF PRESENT ILLNESS (HPI)  The patient presented with command auditory hallucinations telling her to kill self.   Hx of treatment for  Schizoaffective disorder  Currently prescribed: carbamazepine , aripiprazole , trazodone ; reports was taking lithium orotate  "it feel like it was helping me".  mal compliance  Reports hearing voices telling her to kill self, and not take her medications  Today, client reports symptoms of "mania", some depression with anergia, anhedonia, amotivation, no anxiety,  feeling restlessness, no reported panic symptoms, no reported obsessive/compulsive behaviors. She is noted to be responding to internal stimuli during encounter;  Client denied past episodes of hypomania, hyperactivity, erratic/excessive spending, involvement in dangerous activities, self-inflated ego, grandiosity, or promiscuity.  sleeping 4 hrs/24hrs, appetite decreased, concentration decreased, exhibits thought blocking, racy thoughts, Reviewed active medication  list/reviewed labs. Obtained Collateral information from medical record.  No Carbamazepine  lab level available for this encounter to review; no recent EKG; noted hyponatremia    PAST PSYCHIATRIC  HISTORY    Previous Psychiatric Hospitalizations: several, last was Nov 2024 Previous Detox/Residential treatments:denied Outpt treatment:  [facility] Previous psychotropic medication trials: depakote, ziprasidone ,  Previous mental health diagnosis per client/MEDICAL RECORD NUMBERschizoaffective, bipolar  Suicide attempts/self-injurious behaviors:  denied history of suicidal/homicidal ideation/gestures; denied history of self-harm behaviors  History of trauma/abuse/neglect/exploitation:  denied   PAST MEDICAL HISTORY  Past Medical History:  Diagnosis Date   Bipolar disorder (HCC)    Chronic kidney disease    Diabetes mellitus without complication (HCC)    Hypercholesteremia    Manic behavior (HCC)      HOME MEDICATIONS  PTA Medications  Medication Sig   atorvastatin  (LIPITOR) 40 MG tablet Take 1 tablet (40 mg total) by mouth daily.   losartan  (COZAAR ) 25 MG tablet Take 1 tablet (25 mg total) by mouth daily.   ARIPiprazole  (ABILIFY ) 20 MG tablet Take 1 tablet (20 mg total) by mouth every morning.   traZODone  (DESYREL ) 100 MG tablet Take 1 tablet (100 mg total) by mouth at bedtime.   glipiZIDE  (GLUCOTROL ) 5 MG tablet Take 1 tablet (5 mg total) by mouth daily with breakfast.   insulin  glargine-yfgn (SEMGLEE ) 100 UNIT/ML injection Inject 0.1 mLs (10 Units total) into the skin at bedtime.   metFORMIN  (GLUCOPHAGE ) 1000 MG tablet Take 1 tablet (1,000 mg total) by mouth 2 (two) times daily with a meal.   folic acid  (FOLVITE ) 1 MG tablet Take 0.5 tablets (0.5 mg total) by mouth daily.   carbamazepine  (TEGRETOL ) 200 MG tablet Take 2 tablets (400 mg total) by mouth 3 (three) times daily.   cholecalciferol  (VITAMIN D3) 25 MCG (1000 UNIT) tablet Take 2 tablets (2,000 Units total) by mouth daily.     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   Social Drivers of Health Y/N   Physicist, medical Strain: N  Food Insecurity: N  Transportation Needs: N  Physical Activity: N  Stress: N  Social Connections: N  Intimate Partner Violence: N  Housing Stability: N      Have you used/abused any of the following (include frequency/amt/last use):  a. Tobacco products N   b. ETOH  Y  last drink 6-8 months ago Denied illicit drug use        FAMILY HISTORY  Family History  Problem Relation Age of Onset   Diabetes Father    Diabetes Sister    Family Psychiatric History (if known):  second cousin schizophrenia, aunt alcoholism; paternal uncle committed suicide   MENTAL STATUS EXAM (MSE)  Mental Status Exam: General Appearance: Disheveled  Orientation:  Full (Time, Place, and Person)  Memory:  Immediate;   Fair Recent;  Fair Remote;   Fair  Concentration:  Concentration: Fair  Recall:  Fair  Attention  Poor  Eye Contact:  Good  Speech:  Pressured  Language:  Good  Volume:  Normal  Mood: anxious  Affect:  Constricted  Thought Process:  Descriptions of Associations: Circumstantial noted thought blocking  Thought Content:  Hallucinations: Auditory and Rumination  Suicidal Thoughts:  Yes.  without intent/plan  Homicidal Thoughts:  No  Judgement:  Impaired  Insight:  Lacking  Psychomotor Activity:  Decreased  Akathisia:  Negative  Fund of Knowledge:  Good    Assets:  Desire for Improvement Housing Social Support  Cognition:  WNL  ADL's:  Intact  AIMS (if indicated):       VITALS  Blood pressure 113/79, pulse 99, temperature 98.6 F (37 C), temperature source Oral, resp.  rate 16, height 5\' 2"  (1.575 m), weight 74.4 kg, SpO2 98%.  LABS  Admission on 09/12/2023  Component Date Value Ref Range Status   Sodium 09/12/2023 132 (L)  135 - 145 mmol/L Final   Potassium 09/12/2023 4.0  3.5 - 5.1 mmol/L Final   Chloride 09/12/2023 101  98 - 111 mmol/L Final   CO2 09/12/2023 19 (L)  22 - 32 mmol/L Final   Glucose, Bld 09/12/2023 256 (H)  70 - 99 mg/dL Final   Glucose reference range applies only to samples taken after fasting for at least 8 hours.   BUN 09/12/2023 14  8 - 23 mg/dL Final   Creatinine, Ser 09/12/2023 0.77  0.44 - 1.00 mg/dL Final   Calcium  09/12/2023 10.0  8.9 - 10.3 mg/dL Final   Total Protein 16/02/9603 7.9  6.5 - 8.1 g/dL Final   Albumin 54/01/8118 4.8  3.5 - 5.0 g/dL Final   AST 14/78/2956 23  15 - 41 U/L Final   ALT 09/12/2023 21  0 - 44 U/L Final   Alkaline Phosphatase 09/12/2023 134 (H)  38 - 126 U/L Final   Total Bilirubin 09/12/2023 0.5  0.0 - 1.2 mg/dL Final   GFR, Estimated 09/12/2023 >60  >60 mL/min Final   Comment: (NOTE) Calculated using the CKD-EPI Creatinine Equation (2021)    Anion gap 09/12/2023 12  5 - 15 Final   Performed at [facility], [address] [place], [place] [address]   WBC 09/12/2023 13.8 (H)  4.0 - 10.5 K/uL Final   RBC 09/12/2023 4.63  3.87 - 5.11 MIL/uL Final   Hemoglobin 09/12/2023 14.5  12.0 - 15.0 g/dL Final   HCT 65/78/4696 43.0  36.0 - 46.0 % Final   MCV 09/12/2023 92.9  80.0 - 100.0 fL Final   MCH 09/12/2023 31.3  26.0 - 34.0 pg Final   MCHC 09/12/2023 33.7  30.0 - 36.0 g/dL Final   RDW 29/52/8413 13.1  11.5 - 15.5 % Final   Platelets 09/12/2023 385  150 - 400 K/uL Final   nRBC 09/12/2023 0.0  0.0 - 0.2 % Final   Performed at [facility], [address] [place], [place] [address]   Tricyclic, Ur Screen 09/12/2023 NONE DETECTED  NONE DETECTED Final   Amphetamines, Ur Screen 09/12/2023 NONE DETECTED  NONE DETECTED Final   MDMA (Ecstasy)Ur Screen 09/12/2023 NONE DETECTED  NONE  DETECTED Final   Cocaine Metabolite,Ur [place] 09/12/2023 NONE DETECTED  NONE DETECTED Final   Opiate, Ur Screen 09/12/2023 NONE DETECTED  NONE DETECTED Final   Phencyclidine (PCP) Ur S 09/12/2023 NONE DETECTED  NONE DETECTED Final   Cannabinoid 50 Ng, Ur  [place] 09/12/2023 NONE DETECTED  NONE DETECTED Final   Barbiturates, Ur Screen 09/12/2023 NONE DETECTED  NONE DETECTED Final   Benzodiazepine, Ur Scrn 09/12/2023 NONE DETECTED  NONE DETECTED Final   Methadone Scn, Ur 09/12/2023 NONE DETECTED  NONE DETECTED Final   Comment: (NOTE) Tricyclics + metabolites, urine    Cutoff 1000 ng/mL Amphetamines + metabolites, urine  Cutoff 1000 ng/mL MDMA (Ecstasy), urine              Cutoff 500 ng/mL Cocaine Metabolite, urine          Cutoff 300 ng/mL Opiate + metabolites, urine        Cutoff 300 ng/mL Phencyclidine (PCP), urine         Cutoff 25 ng/mL Cannabinoid, urine                 Cutoff 50 ng/mL Barbiturates + metabolites, urine  Cutoff 200 ng/mL Benzodiazepine, urine              Cutoff 200 ng/mL Methadone, urine                   Cutoff 300 ng/mL  The urine drug screen provides only a preliminary, unconfirmed analytical test result and should not be used for non-medical purposes. Clinical consideration and professional judgment should be applied to any positive drug screen result due to possible interfering substances. A more specific alternate chemical method must be used in order to obtain a confirmed analytical result. Gas chromatography / mass spectrometry (GC/MS) is the preferred confirm                          atory method. Performed at [facility], [address] [place], [place] [address]    Acetaminophen  (Tylenol ), Serum 09/12/2023 <10 (L)  10 - 30 ug/mL Final   Comment: (NOTE) Therapeutic concentrations vary significantly. A range of 10-30 ug/mL  may be an effective concentration for many patients. However, some  are best treated at concentrations outside of this  range. Acetaminophen  concentrations >150 ug/mL at 4 hours after ingestion  and >50 ug/mL at 12 hours after ingestion are often associated with  toxic reactions.  Performed at [facility], [address] [place], [place] [address]    Salicylate Lvl 09/12/2023 <7.0 (L)  7.0 - 30.0 mg/dL Final   Performed at [facility], [address] [place], [place] [address]   Alcohol, Ethyl (B) 09/12/2023 <10  <10 mg/dL Final   Comment: (NOTE) For medical purposes only. Performed at [facility], [address], [place], [place] [address]     PSYCHIATRIC REVIEW OF SYSTEMS (ROS)  Depression:      []  Denies all symptoms of depression [x] Depressed mood       [] Insomnia/hypersomnia              [x] Fatigue        [] Change in appetite     [x] Anhedonia                                [x] Difficulty concentrating      [] Hopelessness             [] Worthlessness [] Guilt/shame                [x] Psychomotor agitation/retardation   Mania:     [] Denies all symptoms of mania [] Elevated mood           []   Irritability         [] Pressured speech         []  Grandiosity         []  Decreased need for sleep                                                 [] Increased energy          []  Increase in goal directed activity                                       [] Flight of ideas    []  Excessive involvement in high-risk behaviors                   [x]  Distractibility     Psychosis:     [] Denies all symptoms of psychosis [] Paranoia         [x]  Auditory Hallucinations          [] Visual hallucinations         [] ELOC        [] IOR                [] Delusions   Suicide:    []  Denies SI/plan/intent []  Passive SI         [x]   Active SI         [] Plan           [] Intent   Homicide:  [x]   Denies HI/plan/intent []  Passive HI         []  Active HI         [] Plan            [] Intent           [] Identified Target    Additional findings:      Musculoskeletal: No abnormal movements observed      Gait &  Station: Normal      Pain Screening: Denies      Nutrition & Dental Concerns: none reported  RISK FORMULATION/ASSESSMENT  Is the patient experiencing any suicidal or homicidal ideations: Yes       Explain if yes: auditory command hallucinations to stop medication and kill self; denied plan but fears will worsen  Protective factors considered for safety management:     Access to adequate health care Advice& help seeking Resourcefulness/Survival skills Children    Risk factors/concerns considered for safety management:   Family history of suicide Depression Access to lethal means Age over 84 Impulsivity  Is there a safety management plan with the patient and treatment team to minimize risk factors and promote protective factors: Yes           Explain: safety obs Is crisis care placement or psychiatric hospitalization recommended: Yes     Based on my current evaluation and risk assessment, patient is determined at this time to be at:  High risk  *RISK ASSESSMENT Risk assessment is a dynamic process; it is possible that this patient's condition, and risk level, may change. This should be re-evaluated and managed over time as appropriate. Please re-consult psychiatric consult services if additional assistance is needed in terms of risk assessment and management. If your team decides to discharge this patient, please advise the patient how to best access emergency psychiatric  services, or to call 911, if their condition worsens or they feel unsafe in any way.  Total time spent in this encounter was 60 minutes with greater than 50% of time spent in counseling and coordination of care.     Dr. Ethyl Hering, PhD, MSN, APRN, PMHNP-BC, MCJ Shayaan Parke  Ainsley Alfred, NP Telepsychiatry Consult Services

## 2023-09-13 NOTE — ED Notes (Signed)
 BMU called for bed update - advised call would be returned.

## 2023-09-13 NOTE — ED Notes (Signed)
 IVC/Consult completed/Inpt psych admission recommended:

## 2023-09-14 ENCOUNTER — Encounter: Payer: Self-pay | Admitting: Psychiatry

## 2023-09-14 ENCOUNTER — Other Ambulatory Visit: Payer: Self-pay

## 2023-09-14 ENCOUNTER — Inpatient Hospital Stay
Admission: AD | Admit: 2023-09-14 | Discharge: 2023-09-29 | DRG: 885 | Disposition: A | Discharge status: Home / Self Care | Source: Intra-hospital | Attending: Psychiatry | Admitting: Psychiatry

## 2023-09-14 DIAGNOSIS — T383X6A Underdosing of insulin and oral hypoglycemic [antidiabetic] drugs, initial encounter: Secondary | ICD-10-CM | POA: Diagnosis present

## 2023-09-14 DIAGNOSIS — Z7984 Long term (current) use of oral hypoglycemic drugs: Secondary | ICD-10-CM | POA: Diagnosis not present

## 2023-09-14 DIAGNOSIS — F250 Schizoaffective disorder, bipolar type: Secondary | ICD-10-CM | POA: Diagnosis present

## 2023-09-14 DIAGNOSIS — E7800 Pure hypercholesterolemia, unspecified: Secondary | ICD-10-CM | POA: Diagnosis present

## 2023-09-14 DIAGNOSIS — Z794 Long term (current) use of insulin: Secondary | ICD-10-CM | POA: Diagnosis not present

## 2023-09-14 DIAGNOSIS — Z91148 Patient's other noncompliance with medication regimen for other reason: Secondary | ICD-10-CM

## 2023-09-14 DIAGNOSIS — Z604 Social exclusion and rejection: Secondary | ICD-10-CM | POA: Diagnosis present

## 2023-09-14 DIAGNOSIS — Z880 Allergy status to penicillin: Secondary | ICD-10-CM | POA: Diagnosis not present

## 2023-09-14 DIAGNOSIS — Z833 Family history of diabetes mellitus: Secondary | ICD-10-CM

## 2023-09-14 DIAGNOSIS — Z91014 Allergy to mammalian meats: Secondary | ICD-10-CM

## 2023-09-14 DIAGNOSIS — E1110 Type 2 diabetes mellitus with ketoacidosis without coma: Secondary | ICD-10-CM | POA: Diagnosis present

## 2023-09-14 DIAGNOSIS — Z79899 Other long term (current) drug therapy: Secondary | ICD-10-CM | POA: Diagnosis not present

## 2023-09-14 DIAGNOSIS — R440 Auditory hallucinations: Secondary | ICD-10-CM

## 2023-09-14 DIAGNOSIS — Z888 Allergy status to other drugs, medicaments and biological substances status: Secondary | ICD-10-CM | POA: Diagnosis not present

## 2023-09-14 LAB — GLUCOSE, CAPILLARY
Glucose-Capillary: 311 mg/dL — ABNORMAL HIGH (ref 70–99)
Glucose-Capillary: 538 mg/dL (ref 70–99)
Glucose-Capillary: 555 mg/dL (ref 70–99)
Glucose-Capillary: 332 mg/dL — ABNORMAL HIGH (ref 70–99)
Glucose-Capillary: 320 mg/dL — ABNORMAL HIGH (ref 70–99)
Glucose-Capillary: 210 mg/dL — ABNORMAL HIGH (ref 70–99)

## 2023-09-14 LAB — BASIC METABOLIC PANEL WITH GFR
Anion gap: 16 — ABNORMAL HIGH (ref 5–15)
BUN: 22 mg/dL (ref 8–23)
CO2: 14 mmol/L — ABNORMAL LOW (ref 22–32)
Calcium: 10 mg/dL (ref 8.9–10.3)
Chloride: 105 mmol/L (ref 98–111)
Creatinine, Ser: 0.82 mg/dL (ref 0.44–1.00)
GFR, Estimated: >60 mL/min (ref >60)
Glucose, Bld: 481 mg/dL — ABNORMAL HIGH (ref 70–99)
Potassium: 4 mmol/L (ref 3.5–5.1)
Sodium: 135 mmol/L (ref 135–145)

## 2023-09-14 LAB — HEMOGLOBIN A1C
Hgb A1c MFr Bld: 7.2 % — ABNORMAL HIGH (ref 4.8–5.6)
Mean Plasma Glucose: 159.94 mg/dL

## 2023-09-14 MED ORDER — INSULIN GLARGINE-YFGN 100 UNIT/ML ~~LOC~~ SOLN: 10.0000 [IU] | Freq: Every day | SUBCUTANEOUS | Status: DC

## 2023-09-14 MED ORDER — LINAGLIPTIN 5 MG PO TABS
5.0000 mg | ORAL_TABLET | Freq: Every day | ORAL | Status: DC
Start: 2023-09-14 — End: 2023-09-14

## 2023-09-14 MED ORDER — ACETAMINOPHEN 325 MG PO TABS: 650.0000 mg | ORAL_TABLET | Freq: Four times a day (QID) | ORAL | Status: DC | PRN

## 2023-09-14 MED ORDER — INSULIN GLARGINE-YFGN 100 UNIT/ML ~~LOC~~ SOLN
10.0000 [IU] | Freq: Every day | SUBCUTANEOUS | Status: DC
  Administered 2023-09-14: 10 [IU] via SUBCUTANEOUS
  Filled 2023-09-14 (×3): qty 0.1

## 2023-09-14 MED ORDER — ARIPIPRAZOLE 5 MG PO TABS
10.0000 mg | ORAL_TABLET | Freq: Every day | ORAL | Status: DC
  Administered 2023-09-14 – 2023-09-15 (×2): 10 mg via ORAL
  Filled 2023-09-14 (×2): qty 2

## 2023-09-14 MED ORDER — INSULIN ASPART 100 UNIT/ML IJ SOLN
5.0000 [IU] | Freq: Three times a day (TID) | INTRAMUSCULAR | Status: DC
  Administered 2023-09-14: 5 [IU] via SUBCUTANEOUS
  Filled 2023-09-14: qty 1

## 2023-09-14 MED ORDER — METFORMIN HCL 500 MG PO TABS
1000.0000 mg | ORAL_TABLET | Freq: Two times a day (BID) | ORAL | Status: DC
  Administered 2023-09-15 – 2023-09-29 (×28): 1000 mg via ORAL
  Filled 2023-09-14 (×31): qty 2

## 2023-09-14 MED ORDER — INSULIN GLARGINE-YFGN 100 UNIT/ML ~~LOC~~ SOLN
15.0000 [IU] | Freq: Every day | SUBCUTANEOUS | Status: DC
  Filled 2023-09-14: qty 0.15

## 2023-09-14 MED ORDER — GLIPIZIDE 5 MG PO TABS
5.0000 mg | ORAL_TABLET | Freq: Every day | ORAL | Status: DC
  Administered 2023-09-14 – 2023-09-27 (×14): 5 mg via ORAL
  Filled 2023-09-14 (×16): qty 1

## 2023-09-14 MED ORDER — LINAGLIPTIN 5 MG PO TABS
5.0000 mg | ORAL_TABLET | Freq: Every day | ORAL | Status: DC
  Administered 2023-09-14 – 2023-09-29 (×16): 5 mg via ORAL
  Filled 2023-09-14 (×19): qty 1

## 2023-09-14 MED ORDER — ALUM & MAG HYDROXIDE-SIMETH 200-200-20 MG/5ML PO SUSP: 15.0000 mL | Freq: Four times a day (QID) | ORAL | Status: DC | PRN

## 2023-09-14 MED ORDER — MAGNESIUM HYDROXIDE 400 MG/5ML PO SUSP: 15.0000 mL | Freq: Every day | ORAL | Status: DC | PRN

## 2023-09-14 MED ORDER — OLANZAPINE 10 MG IM SOLR: 5.0000 mg | Freq: Three times a day (TID) | INTRAMUSCULAR | Status: DC | PRN

## 2023-09-14 MED ORDER — TRAZODONE HCL 100 MG PO TABS
100.0000 mg | ORAL_TABLET | Freq: Every day | ORAL | Status: DC
  Administered 2023-09-14 – 2023-09-28 (×15): 100 mg via ORAL
  Filled 2023-09-14 (×15): qty 1

## 2023-09-14 MED ORDER — INSULIN GLARGINE 100 UNITS/ML SOLOSTAR PEN
15.0000 [IU] | PEN_INJECTOR | SUBCUTANEOUS | Status: DC
Start: 2023-09-14 — End: 2023-09-14

## 2023-09-14 MED ORDER — INSULIN GLARGINE-YFGN 100 UNIT/ML ~~LOC~~ SOLN
10.0000 [IU] | Freq: Every day | SUBCUTANEOUS | Status: DC
  Administered 2023-09-14: 10 [IU] via SUBCUTANEOUS
  Filled 2023-09-14: qty 0.1

## 2023-09-14 MED ORDER — INSULIN ASPART 100 UNIT/ML IJ SOLN
0.0000 [IU] | Freq: Three times a day (TID) | INTRAMUSCULAR | Status: DC
  Administered 2023-09-14 (×2): 11 [IU] via SUBCUTANEOUS
  Administered 2023-09-14: 15 [IU] via SUBCUTANEOUS
  Administered 2023-09-15: 11 [IU] via SUBCUTANEOUS
  Administered 2023-09-15: 8 [IU] via SUBCUTANEOUS
  Administered 2023-09-15: 11 [IU] via SUBCUTANEOUS
  Administered 2023-09-16 (×2): 8 [IU] via SUBCUTANEOUS
  Administered 2023-09-16 – 2023-09-17 (×2): 3 [IU] via SUBCUTANEOUS
  Administered 2023-09-17: 11 [IU] via SUBCUTANEOUS
  Administered 2023-09-17 – 2023-09-18 (×2): 3 [IU] via SUBCUTANEOUS
  Administered 2023-09-18: 5 [IU] via SUBCUTANEOUS
  Administered 2023-09-19: 2 [IU] via SUBCUTANEOUS
  Administered 2023-09-20: 3 [IU] via SUBCUTANEOUS
  Administered 2023-09-21: 5 [IU] via SUBCUTANEOUS
  Administered 2023-09-21 – 2023-09-22 (×2): 2 [IU] via SUBCUTANEOUS
  Administered 2023-09-22: 3 [IU] via SUBCUTANEOUS
  Administered 2023-09-23: 2 [IU] via SUBCUTANEOUS
  Administered 2023-09-23 – 2023-09-25 (×3): 3 [IU] via SUBCUTANEOUS
  Administered 2023-09-26: 1 [IU] via SUBCUTANEOUS
  Administered 2023-09-26 – 2023-09-28 (×2): 2 [IU] via SUBCUTANEOUS
  Administered 2023-09-28: 3 [IU] via SUBCUTANEOUS
  Filled 2023-09-14 (×14): qty 1

## 2023-09-14 MED ORDER — OLANZAPINE 5 MG PO TBDP
5.0000 mg | ORAL_TABLET | Freq: Three times a day (TID) | ORAL | Status: DC | PRN
  Administered 2023-09-14 – 2023-09-19 (×3): 5 mg via ORAL
  Filled 2023-09-14 (×3): qty 1

## 2023-09-14 NOTE — Group Note (Signed)
 Date:  09/14/2023 Time:  10:46 AM  Group Topic/Focus:  Goals Group:   The focus of this group is to help patients establish daily goals to achieve during treatment and discuss how the patient can incorporate goal setting into their daily lives to aide in recovery.    Participation Level:  Present did not engage  Participation Quality:  Appropriate  Affect:    Cognitive:    Insight:   Engagement in Group:    Modes of Intervention:    Additional Comments:    Rita Sullivan 09/14/2023, 10:46 AM

## 2023-09-14 NOTE — Progress Notes (Signed)
 Sliding scale insulin  given and oral glipizide  given. Pt initially refused. Pt was educated on importance and hesitantly allowed administration of medication. Pt then stated she was not going to take anymore insulin  or glipizide . Pt educated on what could happen if she did not take her medications and allow us  to get her CBG WNL. Pt yelled at this writer she was refusing any further medications and would refuse to be transferred for further treatment.

## 2023-09-14 NOTE — Progress Notes (Signed)
   09/14/23 0600  15 Minute Checks  Location Bedroom  Visual Appearance Calm  Behavior Sleeping  Sleep (Behavioral Health Patients Only)  Calculate sleep? (Click Yes once per 24 hr at 0600 safety check) Yes  Documented sleep last 24 hours 3

## 2023-09-14 NOTE — Plan of Care (Signed)
  Problem: Education: Goal: Ability to describe self-care measures that may prevent or decrease complications (Diabetes Survival Skills Education) will improve Outcome: Not Progressing Goal: Individualized Educational Video(s) Outcome: Not Progressing   Problem: Coping: Goal: Ability to adjust to condition or change in health will improve Outcome: Not Progressing   Problem: Health Behavior/Discharge Planning: Goal: Ability to identify and utilize available resources and services will improve Outcome: Not Progressing Goal: Ability to manage health-related needs will improve Outcome: Not Progressing   Problem: Metabolic: Goal: Ability to maintain appropriate glucose levels will improve Outcome: Not Progressing   Problem: Nutritional: Goal: Maintenance of adequate nutrition will improve Outcome: Not Progressing Goal: Progress toward achieving an optimal weight will improve Outcome: Not Progressing

## 2023-09-14 NOTE — Plan of Care (Signed)
 D: Pt alert and oriented. Pt denies experiencing any anxiety/depression at this time. Pt denies experiencing any pain at this time. Pt denies experiencing any SI/HI, or AVH at this time.   A: Scheduled medications administered to pt, per MD orders. Support and encouragement provided. Frequent verbal contact made. Routine safety checks conducted q15 minutes.   R: No adverse drug reactions noted. Pt verbally contracts for safety at this time. Pt compliant with medications and treatment plan. Pt interacts poorly with others on the unit. Pt remains safe at this time. Plan of care ongoing.  Pt continues to be confused and approach exit doors after being educated about expected behaviors. Pt follows staff and other patients around on the unit. Pt went into another pt's room. She has grabbed the arm of another pt in attempt to guide her to her room. She has also stepped in her own urine from not getting all her urine in the toilet. Pt was encouraged to shower.   Problem: Coping: Goal: Coping ability will improve Outcome: Not Progressing   Problem: Coping: Goal: Coping ability will improve Outcome: Not Progressing

## 2023-09-14 NOTE — Progress Notes (Signed)
 Admission Note:  68 yr female who presents IVC in no acute distress for the treatment of Auditory Hallucinations. Pt appears animated and anxious. Pt was calm and cooperative with admission process. Pt presents with passive SI and contracts for safety upon admission. Pt denies AVH, but are appeared to be responding to internal stimuli. Pt expressed disbelief of admission to hospital, "I can't believe he brought me to the hospital". Skin was assessed and found sweaty, but no abnormal marks found on body. PT searched and no contraband found, POC and unit policies explained and understanding verbalized. Consents obtained. Food and fluids offered, and accepted. Pt had no additional questions or concerns.

## 2023-09-14 NOTE — Group Note (Signed)
 Date:  09/14/2023 Time:  3:17 AM  Group Topic/Focus:  Wrap-Up Group:   The focus of this group is to help patients review their daily goal of treatment and discuss progress on daily workbooks.    Participation Level:  Did Not Attend  Participation Quality:      Affect:      Cognitive:      Insight: None  Engagement in Group:  None  Modes of Intervention:      Additional Comments:    Rolland Cline 09/14/2023, 3:17 AM

## 2023-09-14 NOTE — [facility] Suicide Risk Assessment (Signed)
 [facility] Admission Suicide Risk Assessment   Nursing information obtained from:  Patient Demographic factors:  Age 68 or older, Caucasian Current Mental Status:  Suicidal ideation indicated by others Loss Factors:  NA Historical Factors:  Impulsivity Risk Reduction Factors:  Positive social support  Total Time spent with patient: 30 minutes Principal Problem: Schizoaffective disorder, bipolar type (HCC) Diagnosis:  Principal Problem:   Schizoaffective disorder, bipolar type (HCC) Active Problems:   Auditory hallucinations  Subjective Data: Patient is a 68 year old female with history of schizoaffective disorder, bipolar type presented with command auditory hallucinations telling her to kill self.  Patient currently admitted to geropsych unit for further stabilization.  Continued Clinical Symptoms:  Alcohol Use Disorder Identification Test Final Score (AUDIT): 0 The "Alcohol Use Disorders Identification Test", Guidelines for Use in Primary Care, Second Edition.  World Science writer [facility]). Score between 0-7:  no or low risk or alcohol related problems. Score between 8-15:  moderate risk of alcohol related problems. Score between 16-19:  high risk of alcohol related problems. Score 20 or above:  warrants further diagnostic evaluation for alcohol dependence and treatment.   CLINICAL FACTORS:   Bipolar Disorder:   Mixed State   Musculoskeletal: Strength & Muscle Tone: within normal limits Gait & Station: normal Patient leans: N/A  Psychiatric Specialty Exam:  Presentation  General Appearance:  Bizarre; Disheveled  Eye Contact: Fleeting  Speech: Garbled  Speech Volume: Decreased  Handedness: Right   Mood and Affect  Mood: Irritable  Affect: Labile   Thought Process  Thought Processes: Disorganized  Descriptions of Associations:Tangential  Orientation:Partial  Thought Content:Illogical; Tangential; Paranoid Ideation; Delusions  History of  Schizophrenia/Schizoaffective disorder:No data recorded Duration of Psychotic Symptoms:No data recorded Hallucinations:Hallucinations: Auditory Description of Auditory Hallucinations: command type  Ideas of Reference:Delusions  Suicidal Thoughts:Suicidal Thoughts: No  Homicidal Thoughts:Homicidal Thoughts: No   Sensorium  Memory: Immediate Poor; Recent Poor; Remote Poor  Judgment: Impaired  Insight: Shallow   Executive Functions  Concentration: Poor  Attention Span: Poor  Recall: Poor  Fund of Knowledge: Poor  Language: Poor   Psychomotor Activity  Psychomotor Activity: Psychomotor Activity: Normal   Assets  Assets: Housing; Resilience; Social Support   Sleep  Sleep: Sleep: Poor    Physical Exam: Physical Exam ROS Blood pressure (!) 126/91, pulse (!) 135, temperature (!) 97.2 F (36.2 C), resp. rate 18, height 5\' 2"  (1.575 m), weight 71.4 kg, SpO2 99%. Body mass index is 28.81 kg/m.   COGNITIVE FEATURES THAT CONTRIBUTE TO RISK:  Polarized thinking    SUICIDE RISK:   Mild:  Suicidal ideation of limited frequency, intensity, duration, and specificity.  There are no identifiable plans, no associated intent, mild dysphoria and related symptoms, good self-control (both objective and subjective assessment), few other risk factors, and identifiable protective factors, including available and accessible social support.  PLAN OF CARE: Patient is admitted to [facility] psych unit with Q15 min safety monitoring. Multidisciplinary team approach is offered. Medication management; group/milieu therapy is offered.   I certify that inpatient services furnished can reasonably be expected to improve the patient's condition.   Aurelia Blotter, MD 09/14/2023, 1:37 PM

## 2023-09-14 NOTE — Progress Notes (Signed)
 CBG was rechecked and has increased from prior check to now 555. MD was immediately notified of further elevation. Hospitalist is being notified for patient's further evaluation.

## 2023-09-14 NOTE — Progress Notes (Signed)
   09/14/23 0052  Psych Admission Type (Psych Patients Only)  Admission Status Involuntary  Psychosocial Assessment  Patient Complaints Confusion  Eye Contact Fair  Facial Expression Animated;Anxious  Affect Preoccupied  Speech Logical/coherent  Interaction Assertive;Avoidant;Guarded  Motor Activity Slow  Appearance/Hygiene In scrubs  Behavior Characteristics Appropriate to situation;Anxious  Mood Anxious;Suspicious;Preoccupied  Aggressive Behavior  Effect No apparent injury  Thought Process  Coherency WDL  Content WDL  Delusions None reported or observed  Perception WDL  Hallucination Command;Auditory  Judgment Impaired  Confusion Mild  Danger to Self  Current suicidal ideation? Passive  Description of Suicide Plan "No plan at this time"  Agreement Not to Harm Self Yes  Description of Agreement Verbal  Danger to Others  Danger to Others None reported or observed

## 2023-09-14 NOTE — [facility] Group Notes (Signed)
 [facility] Group Notes:  (Nursing/MHT/Case Management/Adjunct)  Date:  09/14/2023  Time:  8:35 PM  Type of Therapy:  Group Therapy  Participation Level:  Minimal  Participation Quality:  Inattentive  Affect:  Labile  Cognitive:  Confused  Insight:  None  Engagement in Group:  None  Modes of Intervention:   None  Summary of Progress/Problems: Wrap up group (discussion/Music)  Anniece Kind 09/14/2023, 8:35 PM

## 2023-09-14 NOTE — H&P (Signed)
 Psychiatric Admission Assessment Adult  Patient Identification: Rita Sullivan MRN:  [id number] Date of Evaluation:  09/14/2023 Chief Complaint:  Auditory hallucinations [R44.0]   History of Present Illness: Patient is a 68 year old female with history of schizoaffective disorder, bipolar type presented with command auditory hallucinations telling her to kill self.  Patient currently admitted to geropsych unit for further stabilization.  Per ED chart patient presented with command type auditory hallucinations telling her to kill herself and displayed manic symptoms of feeling restless, responding to internal stimuli during assessment.  Today provider was informed by the nursing staff that patient has been irritable and refusing to take her insulin  and has blood sugars checked and the recent CBG was more than 550.  Patient is noted to be responding to internal stimuli and pacing the hallways.  Hospitalist was consulted and patient was evaluated.  With a lot of prompting patient allowed the nurse to give her insulin .  On today's assessment patient refused to sit down and continue to pace in the hallway.  She is extremely disorganized she is unable to acknowledge that she is in a hospital.  She made disorganized statements saying Mathew Solomon, mute here.  She then stated she came through back door for a meeting at be 45.  When provider asked what the meeting is about she stated it is a common her group.  She then came very close to the provider asking if she can boost up the provider's IQ by 25 points.  Provider encouraged her to stay back and respect the personal space.  Patient is able to answer today's date as September 14, 2023.  When asked about her husband patient stated that he is dead and that she has no family but in reality she asked her husband to bring her to the emergency room as the voices were telling her to kill herself.  Patient denies feeling depressed or anxious.  When asked about the suicidal  thoughts she remained silent and pacing for some time and then finally answered "no".  When asked about the voices she refused to answer but is noted to be internally preoccupied and responding.  Patient is noted to be going after patients into their personal space making gestures with her hands mumbling to herself and acting bizarre in the environment.  Called her husband Ruperto Zolayvar [phone].  Husband confirmed the diagnosis of schizoaffective disorder since their marriage.  Patient had multiple episodes of psychotic breakdowns.  She he reports that November 2024 she disappeared for 3 weeks as she drove around and nobody knew where she went and she came back home.  Husband did confirm that patient constantly hear voices telling her to do random things.  He reports that yesterday patient disappeared from home and when husband was looking for her she came home driving.  She informed the husband that she is hearing voices to drive to [place], just sit in the parking lot and then come back home.  Husband reports that she insisted husband to take her to emergency room "otherwise something bad is going to happen".  Which she explained to him later that voices are telling her to kill herself.  Has been is a poor historian to and is unable to recall the name of her medications, name of the current psychiatric provider.  Total Time spent with patient: 1 hour Sleep  Sleep:Sleep: Poor  Past Psychiatric History:  Psychiatric History:  Information collected from patient schizoaffective disorder  Prev Dx/Sx: Schizoaffective disorder Current Psych Provider: Dorris Gaul  to recall, per chart used to follow-up at [facility] Meds (current): Abilify , Tegretol  Previous Med Trials: Per chart Geodon  Therapy: None reported  Prior Psych Hospitalization: Most recent admission at [facility] October 2023 Prior Self Harm: None reported Prior Violence: None reported  Family Psych History: None reported Family Hx  suicide: None reported  Social History:  Developmental Hx: Unknown Educational Hx: Unknown Occupational Hx: On disability Legal Hx: Unknown Living Situation: With husband and has 3 adult children living out of the house Spiritual Hx: Unknown Access to weapons/lethal means: Denies  Substance History-per husband Alcohol: Denies  Illicit drugs: Denies Prescription drug abuse: Denies Rehab hx: Denies Is the patient at risk to self? No.  Has the patient been a risk to self in the past 6 months? No.  Has the patient been a risk to self within the distant past? No.  Is the patient a risk to others? No.  Has the patient been a risk to others in the past 6 months? No.  Has the patient been a risk to others within the distant past? No.   [place] Scale:  Flowsheet Row Admission (Current) from 09/14/2023 in [facility] BE[facility]AL MED[facility] ED from 09/12/2023 in [facility] Emergency Department at [facility] Admission (Discharged) from 02/26/2022 in [facility] BEHAVIORAL MEDICINE  C-SSRS RISK CATEGORY Moderate Risk No Risk No Risk        Past Medical History:  Past Medical History:  Diagnosis Date   Bipolar disorder (HCC)    Chronic kidney disease    Diabetes mellitus without complication (HCC)    Hypercholesteremia    Manic behavior (HCC)     Past Surgical History:  Procedure Laterality Date   COLONOSCOPY WITH PROPOFOL  N/A 03/11/2015   Procedure: COLONOSCOPY WITH PROPOFOL ;  Surgeon: Stephens Eis, MD;  Location: [facility] ENDOSCOPY;  Service: Gastroenterology;  Laterality: N/A;   Family History:  Family History  Problem Relation Age of Onset   Diabetes Father    Diabetes Sister     Social History:  Social History   Substance and Sexual Activity  Alcohol Use Yes   Alcohol/week: 2.0 standard drinks of alcohol   Types: 2 Glasses of wine per week   Comment: Occ     Social History   Substance and Sexual Activity  Drug Use No      Allergies:   Allergies  Allergen  Reactions   Beef-Derived Drug Products Other (See Comments)    Does not eat beef  Update: Pt states that she does eat beef   Divalproex Sodium Other (See Comments)    "bad for my liver"   Pork-Derived Products Other (See Comments)    Does not eat pork Patient states that she does eat pork   Penicillins Rash   Lab Results:  Results for orders placed or performed during the hospital encounter of 09/14/23 (from the past 48 hours)  Glucose, capillary     Status: Abnormal   Collection Time: 09/14/23  1:28 AM  Result Value Ref Range   Glucose-Capillary 311 (H) 70 - 99 mg/dL    Comment: Glucose reference range applies only to samples taken after fasting for at least 8 hours.  Glucose, capillary     Status: Abnormal   Collection Time: 09/14/23  7:21 AM  Result Value Ref Range   Glucose-Capillary 538 (HH) 70 - 99 mg/dL    Comment: Glucose reference range applies only to samples taken after fasting for at least 8 hours.   Comment 1 Notify  RN   Glucose, capillary     Status: Abnormal   Collection Time: 09/14/23  7:52 AM  Result Value Ref Range   Glucose-Capillary 555 (HH) 70 - 99 mg/dL    Comment: Glucose reference range applies only to samples taken after fasting for at least 8 hours.   Comment 1 Notify RN   Basic metabolic panel     Status: Abnormal   Collection Time: 09/14/23  9:57 AM  Result Value Ref Range   Sodium 135 135 - 145 mmol/L   Potassium 4.0 3.5 - 5.1 mmol/L   Chloride 105 98 - 111 mmol/L   CO2 14 (L) 22 - 32 mmol/L   Glucose, Bld 481 (H) 70 - 99 mg/dL    Comment: Glucose reference range applies only to samples taken after fasting for at least 8 hours.   BUN 22 8 - 23 mg/dL   Creatinine, Ser 1.61 0.44 - 1.00 mg/dL   Calcium  10.0 8.9 - 10.3 mg/dL   GFR, Estimated >09 >60 mL/min    Comment: (NOTE) Calculated using the CKD-EPI Creatinine Equation (2021)    Anion gap 16 (H) 5 - 15    Comment: Performed at [facility], [address] [place], [place]  [address]  Glucose, capillary     Status: Abnormal   Collection Time: 09/14/23 11:21 AM  Result Value Ref Range   Glucose-Capillary 332 (H) 70 - 99 mg/dL    Comment: Glucose reference range applies only to samples taken after fasting for at least 8 hours.    Blood Alcohol level:  Lab Results  Component Value Date   ETH <10 09/12/2023   ETH <10 02/26/2022    Metabolic Disorder Labs:  Lab Results  Component Value Date   HGBA1C 8.3 (H) 03/06/2022   MPG 191.51 03/06/2022   MPG 134.11 02/20/2017   Lab Results  Component Value Date   PROLACTIN 23.3 02/20/2017   PROLACTIN 3.6 (L) 07/31/2016   Lab Results  Component Value Date   CHOL 194 03/06/2022   TRIG 113 03/06/2022   HDL 63 03/06/2022   CHOLHDL 3.1 03/06/2022   VLDL 23 03/06/2022   LDLCALC 108 (H) 03/06/2022   LDLCALC 131 (H) 02/20/2017    Current Medications: Current Facility-Administered Medications  Medication Dose Route Frequency Provider Last Rate Last Admin   acetaminophen  (TYLENOL ) tablet 650 mg  650 mg Oral Q6H PRN Onuoha, Chinwendu V, NP       alum & mag hydroxide-simeth (MAALOX/MYLANTA) 200-200-20 MG/5ML suspension 15 mL  15 mL Oral Q6H PRN Onuoha, Chinwendu V, NP       ARIPiprazole  (ABILIFY ) tablet 10 mg  10 mg Oral Daily Ebbie Cherry, MD       glipiZIDE  (GLUCOTROL ) tablet 5 mg  5 mg Oral QAC breakfast Onuoha, Chinwendu V, NP   5 mg at 09/14/23 0739   insulin  aspart (novoLOG ) injection 0-15 Units  0-15 Units Subcutaneous TID WC Onuoha, Chinwendu V, NP   11 Units at 09/14/23 1210   [START ON 09/15/2023] insulin  glargine-yfgn (SEMGLEE ) injection 10 Units  10 Units Subcutaneous QHS Antoniette Batty T, MD       linagliptin  (TRADJENTA ) tablet 5 mg  5 mg Oral Daily Antoniette Batty T, MD   5 mg at 09/14/23 8119   magnesium  hydroxide (MILK OF MAGNESIA) suspension 15 mL  15 mL Oral Daily PRN Onuoha, Chinwendu V, NP       [START ON 09/15/2023] metFORMIN  (GLUCOPHAGE ) tablet 1,000 mg  1,000 mg Oral BID WC  [place], MD        OLANZapine  (ZYPREXA ) injection 5 mg  5 mg Intramuscular TID PRN Onuoha, Chinwendu V, NP       OLANZapine  zydis (ZYPREXA ) disintegrating tablet 5 mg  5 mg Oral TID PRN Onuoha, Chinwendu V, NP   5 mg at 09/14/23 0636   traZODone  (DESYREL ) tablet 100 mg  100 mg Oral QHS Deshone Lyssy, MD       PTA Medications: Medications Prior to Admission  Medication Sig Dispense Refill Last Dose/Taking   ARIPiprazole  (ABILIFY ) 20 MG tablet Take 1 tablet (20 mg total) by mouth every morning. 30 tablet 1 Unknown   atorvastatin  (LIPITOR) 40 MG tablet Take 1 tablet (40 mg total) by mouth daily. 30 tablet 1 Unknown   carbamazepine  (TEGRETOL ) 200 MG tablet Take 2 tablets (400 mg total) by mouth 3 (three) times daily. 90 tablet 1 Unknown   cholecalciferol  (VITAMIN D3) 25 MCG (1000 UNIT) tablet Take 2 tablets (2,000 Units total) by mouth daily. (Patient not taking: Reported on 09/13/2023) 60 tablet 1 Unknown   folic acid  (FOLVITE ) 1 MG tablet Take 0.5 tablets (0.5 mg total) by mouth daily. (Patient not taking: Reported on 09/13/2023) 30 tablet 1 Unknown   glipiZIDE  (GLUCOTROL ) 5 MG tablet Take 1 tablet (5 mg total) by mouth daily with breakfast. 30 tablet 1 Unknown   insulin  glargine-yfgn (SEMGLEE ) 100 UNIT/ML injection Inject 0.1 mLs (10 Units total) into the skin at bedtime. (Patient not taking: Reported on 09/13/2023) 10 mL 1 Unknown   JANUVIA 50 MG tablet Take 50 mg by mouth daily.   Unknown   losartan  (COZAAR ) 25 MG tablet Take 1 tablet (25 mg total) by mouth daily. 30 tablet 1 Unknown   metFORMIN  (GLUCOPHAGE ) 500 MG tablet Take 1,000 mg by mouth 2 (two) times daily with a meal.   Unknown   traZODone  (DESYREL ) 100 MG tablet Take 1 tablet (100 mg total) by mouth at bedtime. 30 tablet 1 Unknown    Psychiatric Specialty Exam:  Presentation  General Appearance:  Bizarre; Disheveled  Eye Contact: Fleeting  Speech: Garbled  Speech Volume: Decreased    Mood and Affect   Mood: Irritable  Affect: Labile   Thought Process  Thought Processes: Disorganized  Descriptions of Associations:Tangential  Orientation:Partial  Thought Content:Illogical; Tangential; Paranoid Ideation; Delusions  Hallucinations:Hallucinations: Auditory Description of Auditory Hallucinations: command type  Ideas of Reference:Delusions  Suicidal Thoughts:Suicidal Thoughts: No  Homicidal Thoughts:Homicidal Thoughts: No   Sensorium  Memory: Immediate Poor; Recent Poor; Remote Poor  Judgment: Impaired  Insight: Shallow   Executive Functions  Concentration: Poor  Attention Span: Poor  Recall: Poor  Fund of Knowledge: Poor  Language: Poor   Psychomotor Activity  Psychomotor Activity: Psychomotor Activity: Normal   Assets  Assets: Housing; Resilience; Social Support    Musculoskeletal: Strength & Muscle Tone: within normal limits Gait & Station: normal  Physical Exam: Physical Exam Vitals and nursing note reviewed.  HENT:     Head: Normocephalic.  Cardiovascular:     Pulses: Normal pulses.  Pulmonary:     Effort: Pulmonary effort is normal.  Skin:    General: Skin is warm.  Neurological:     Mental Status: She is alert.    Review of Systems  Unable to perform ROS: Psychiatric disorder   Blood pressure (!) 126/91, pulse (!) 135, temperature (!) 97.2 F (36.2 C), resp. rate 18, height 5\' 2"  (1.575 m), weight 71.4 kg, SpO2 99%. Body mass index is 28.81 kg/m.  Principal Diagnosis: Schizoaffective disorder, bipolar type (HCC) Diagnosis:  Principal Problem:   Schizoaffective disorder, bipolar type (HCC) Active Problems:   Auditory hallucinations   Clinical Decision Making: Patient with history of schizoaffective disorder currently noncompliant with medication, displaying significant amount of disorganized behavior, leaving the house and wandering/driving on the streets aimlessly, endorsing command type auditory hallucinations  telling her to kill herself, lacks insight and refusing treatment.  Patient is currently admitted to geropsych unit on IVC for further stabilization.  Treatment Plan Summary:  Safety and Monitoring:             -- Voluntary admission to inpatient psychiatric unit for safety, stabilization and treatment             -- Daily contact with patient to assess and evaluate symptoms and progress in treatment             -- Patient's case to be discussed in multi-disciplinary team meeting             -- Observation Level: q15 minute checks             -- Vital signs:  q12 hours             -- Precautions: suicide, elopement, and assault   2. Psychiatric Diagnoses and Treatment:              Restarted home medication Abilify  10 mg daily If patient refuses oral medication will initiate enforced medication consult     -- The risks/benefits/side-effects/alternatives to this medication were discussed in detail with the patient and time was given for questions. The patient consents to medication trial.                -- Metabolic profile and EKG monitoring obtained while on an atypical antipsychotic (BMI: Lipid Panel: HbgA1c: QTc:)              -- Encouraged patient to participate in unit milieu and in scheduled group therapies                            3. Medical Issues Being Addressed:  Hospitalist consult is made given her blood sugars more than 550 and her insulin  is adjusted   4. Discharge Planning:              -- Social work and case management to assist with discharge planning and identification of hospital follow-up needs prior to discharge             -- Estimated LOS: 5-7 days             -- Discharge Concerns: Need to establish a safety plan; Medication compliance and effectiveness             -- Discharge Goals: Return home with outpatient referrals follow ups  Physician Treatment Plan for Primary Diagnosis: Schizoaffective disorder, bipolar type (HCC) Long Term Goal(s): Improvement  in symptoms so as ready for discharge  Short Term Goals: Ability to identify changes in lifestyle to reduce recurrence of condition will improve, Ability to verbalize feelings will improve, Ability to disclose and discuss suicidal ideas, Ability to demonstrate self-control will improve, and Ability to identify and develop effective coping behaviors will improve  Physician Treatment Plan for Secondary Diagnosis: Principal Problem:   Schizoaffective disorder, bipolar type (HCC) Active Problems:   Auditory hallucinations  Long Term Goal(s): Improvement in symptoms so as ready for discharge  Short Term  Goals: Ability to identify changes in lifestyle to reduce recurrence of condition will improve, Ability to verbalize feelings will improve, Ability to disclose and discuss suicidal ideas, Ability to demonstrate self-control will improve, and Ability to identify and develop effective coping behaviors will improve  I certify that inpatient services furnished can reasonably be expected to improve the patient's condition.    Prudie Guthridge, MD 4/22/20251:52 PM

## 2023-09-14 NOTE — Tx Team (Signed)
 Initial Treatment Plan 09/14/2023 3:43 AM Rita Sullivan FIE:[id number]    PATIENT STRESSORS: Medication change or noncompliance     PATIENT STRENGTHS: Average or above average intelligence  Communication skills  Supportive family/friends    PATIENT IDENTIFIED PROBLEMS: "I felt like I was getting manic and hearing voices, throwing pills in the toilet everyday".  Decreased appetite  Decreased sleep                 DISCHARGE CRITERIA:  Ability to meet basic life and health needs Adequate post-discharge living arrangements Medical problems require only outpatient monitoring Motivation to continue treatment in a less acute level of care  PRELIMINARY DISCHARGE PLAN: Outpatient therapy Return to previous living arrangement  PATIENT/FAMILY INVOLVEMENT: This treatment plan has been presented to and reviewed with the patient, Rita Sullivan. The patient has been given the opportunity to ask questions and make suggestions.  Kelly-Savage, Shai Mckenzie Terrell, RN 09/14/2023, 3:43 AM

## 2023-09-14 NOTE — Consult Note (Signed)
 Initial Consultation Note   Patient: Rita Sullivan UJW:[id number] DOB: Jun 07, 1955 PCP: Jimmy Moulding, MD DOA: 09/14/2023 DOS: the patient was seen and examined on 09/14/2023 Primary service: Aurelia Blotter, MD  Referring physician: Dr. Belvie Boyers Reason for consult: Hyperglycemia  Assessment/Plan:  IIDM with hyperglycemia Mild DKA -Secondary to noncompliant with diabetic medications as nurse at the psychiatry unit told me that the patient refused insulin  this morning.  Blood glucose initially trending up.  I went to talk to the patient regarding important of compliance with diabetic medications to avoid worsening of DKA, patient agreed with current regimen including sliding scale and Lantus . -Check A1C, I called patient's pharmacy [facility], confirmed with patient supposed to be on glimepiride and metformin  as well as Januvia.  There was also a prescription for Lantus  10 unit daily however patient never filled the prescription. -Blood work showed mild DKA picture, and patient does not have any symptoms of DKA, as patient blood sugar has been trending down, and patient agreed with compliant with diabetic medications including p.o. medications and insulin , recommend that patient can stay at psychiatry unit if her glucose continue to trending down. - Plan to restart metformin  tomorrow once DKA improved. - Continue glimepiride - Continue Januvia - Once patient's glucose better controlled, can discontinue sliding scale and leave patient on p.o. diabetic medication plus low-dose of Lantus .  We will continue to follow with you.   Schizoaffective disorder -As per primary team.   TRH will continue to follow the patient.  HPI: Rita Sullivan is a 68 y.o. female with past medical history of IIDM presented with schizoaffective disorder.  Patient has history of insulin  independent diabetes, she confirms insulin  she takes glimepiride, metformin  and Januvia and her sugar has been fairly  controlled.  During this hospitalization it was noticed patient blood sugar in the 308 sliding scale and Lantus  10 unit was added.  However patient has been refusing any insulin  injection this morning and also rejected morning dose of glimepiride.  7 2 totaled patient and stressed importance of compliance with diabetic medication regimen to avoid DKA.  Currently patient has no abdominal pain no nauseous vomiting or diarrhea and she is eating her breakfast as usual.  Blood work showed bicarb 14, mild elevation of anion gap 16.  Review of Systems: As mentioned in the history of present illness. All other systems reviewed and are negative. Past Medical History:  Diagnosis Date   Bipolar disorder (HCC)    Chronic kidney disease    Diabetes mellitus without complication (HCC)    Hypercholesteremia    Manic behavior (HCC)    Past Surgical History:  Procedure Laterality Date   COLONOSCOPY WITH PROPOFOL  N/A 03/11/2015   Procedure: COLONOSCOPY WITH PROPOFOL ;  Surgeon: Stephens Eis, MD;  Location: [facility] ENDOSCOPY;  Service: Gastroenterology;  Laterality: N/A;   Social History:  reports that she has never smoked. She has never used smokeless tobacco. She reports current alcohol use of about 2.0 standard drinks of alcohol per week. She reports that she does not use drugs.  Allergies  Allergen Reactions   Beef-Derived Drug Products Other (See Comments)    Does not eat beef  Update: Pt states that she does eat beef   Divalproex Sodium Other (See Comments)    "bad for my liver"   Pork-Derived Products Other (See Comments)    Does not eat pork Patient states that she does eat pork   Penicillins Rash    Family History  Problem Relation Age of  Onset   Diabetes Father    Diabetes Sister     Prior to Admission medications   Medication Sig Start Date End Date Taking? Authorizing Provider  ARIPiprazole  (ABILIFY ) 20 MG tablet Take 1 tablet (20 mg total) by mouth every morning. 03/14/22   Clapacs, Elida Grounds,  MD  atorvastatin  (LIPITOR) 40 MG tablet Take 1 tablet (40 mg total) by mouth daily. 03/13/22   Clapacs, Elida Grounds, MD  carbamazepine  (TEGRETOL ) 200 MG tablet Take 2 tablets (400 mg total) by mouth 3 (three) times daily. 03/13/22   Clapacs, Elida Grounds, MD  cholecalciferol  (VITAMIN D3) 25 MCG (1000 UNIT) tablet Take 2 tablets (2,000 Units total) by mouth daily. Patient not taking: Reported on 09/13/2023 03/13/22   Clapacs, Elida Grounds, MD  folic acid  (FOLVITE ) 1 MG tablet Take 0.5 tablets (0.5 mg total) by mouth daily. Patient not taking: Reported on 09/13/2023 03/14/22   Clapacs, Elida Grounds, MD  glipiZIDE  (GLUCOTROL ) 5 MG tablet Take 1 tablet (5 mg total) by mouth daily with breakfast. 03/14/22   Clapacs, Elida Grounds, MD  insulin  glargine-yfgn (SEMGLEE ) 100 UNIT/ML injection Inject 0.1 mLs (10 Units total) into the skin at bedtime. Patient not taking: Reported on 09/13/2023 03/13/22   Clapacs, Elida Grounds, MD  JANUVIA 50 MG tablet Take 50 mg by mouth daily. 08/24/23 08/23/24  [provider]  losartan  (COZAAR ) 25 MG tablet Take 1 tablet (25 mg total) by mouth daily. 03/13/22   Clapacs, Elida Grounds, MD  metFORMIN  (GLUCOPHAGE ) 500 MG tablet Take 1,000 mg by mouth 2 (two) times daily with a meal. 04/08/23   [provider]  traZODone  (DESYREL ) 100 MG tablet Take 1 tablet (100 mg total) by mouth at bedtime. 03/13/22   Clapacs, Elida Grounds, MD    Physical Exam: Vitals:   09/14/23 0052 09/14/23 0054 09/14/23 0725  BP: (!) 149/106 (!) 121/91 (!) 126/91  Pulse: (!) 116 (!) 113 (!) 135  Resp: 18 18 18   Temp: (!) 97.5 F (36.4 C)  (!) 97.2 F (36.2 C)  TempSrc: Oral    SpO2: 98% 99% 99%  Weight: 71.4 kg    Height: 5\' 2"  (1.575 m)     Eyes: PERRL, lids and conjunctivae normal ENMT: Mucous membranes are moist. Posterior pharynx clear of any exudate or lesions.Normal dentition.  Neck: normal, supple, no masses, no thyromegaly Respiratory: clear to auscultation bilaterally, no wheezing, no crackles. Normal respiratory  effort. No accessory muscle use.  Cardiovascular: Regular rate and rhythm, no murmurs / rubs / gallops. No extremity edema. 2+ pedal pulses. No carotid bruits.  Abdomen: no tenderness, no masses palpated. No hepatosplenomegaly. Bowel sounds positive.  Musculoskeletal: no clubbing / cyanosis. No joint deformity upper and lower extremities. Good ROM, no contractures. Normal muscle tone.  Skin: no rashes, lesions, ulcers. No induration Neurologic: CN 2-12 grossly intact. Sensation intact, DTR normal.  Muscle strength 5/5 on both sides Psychiatric: Normal judgment and insight. Alert and oriented x 3. Normal mood.    Data Reviewed:  BMP, recent diabetic study including A1c revealed  Family Communication: None at bedside Primary team communication: Psychiatry team Thank you very much for involving us  in the care of your patient.  Author: [place], MD 09/14/2023 12:59 PM  For on call review [email or web address].

## 2023-09-14 NOTE — [facility] Counselor (Signed)
 CSW contacted Ruperto Zolayvar, husband, [phone] per provider's request to get documentation of POA.   Ruperto reports that he does not have a POA but that he has a living will instead.   Rupeto reports in the past the hospital has had to get a court order to give pt forced medications.   CSW informed provider of conversation.   Derrill Flirt, MSW, [place] 09/14/2023 10:25 AM

## 2023-09-14 NOTE — Group Note (Signed)
 Recreation Therapy Group Note   Group Topic:Stress Management  Group Date: 09/14/2023 Start Time: 1400 End Time: 1500 Facilitators: Deatrice Factor, LRT, CTRS Location: Courtyard  Group Description: Meditation. LRT and patients discussed what they know about meditation and mindfulness. LRT played a Deep Breathing Meditation exercise script for patients to follow along to. LRT and patients discussed how meditation and deep breathing can be used as a coping skill post--discharge to help manage symptoms of stress.   Goal Area(s) Addressed: Patient will practice using relaxation technique. Patient will identify a new coping skill.  Patient will follow multistep directions to reduce anxiety and stress.   Affect/Mood: Full range   Participation Level: Minimal   Participation Quality: Moderate Cues    Clinical Observations/Individualized Feedback: Lilyann was present in group. Pt was observed getting out of her seat and walking over to the raised garden beds. Pt then began to hike up one of her legs and begin to put her foot into the garden bed. LRT redirected pt immediately and pt did not end up putting her foot into the raised garden bed. Pt then asked LRT if she could look over the side of the courtyard walls. Pt was also heard randomly yelling out things at inappropriate times.   Plan: Continue to engage patient in RT group sessions 2-3x/week.   Deatrice Factor, LRT, CTRS 09/14/2023 4:46 PM

## 2023-09-14 NOTE — Progress Notes (Signed)
 MD notified of elevated CBG of 538. MD notified this writer to give 15 units and oral medication glipizide  and recheck CBG.

## 2023-09-15 DIAGNOSIS — F250 Schizoaffective disorder, bipolar type: Secondary | ICD-10-CM | POA: Diagnosis not present

## 2023-09-15 LAB — GLUCOSE, CAPILLARY
Glucose-Capillary: 271 mg/dL — ABNORMAL HIGH (ref 70–99)
Glucose-Capillary: 328 mg/dL — ABNORMAL HIGH (ref 70–99)
Glucose-Capillary: 310 mg/dL — ABNORMAL HIGH (ref 70–99)
Glucose-Capillary: 200 mg/dL — ABNORMAL HIGH (ref 70–99)

## 2023-09-15 LAB — BASIC METABOLIC PANEL WITH GFR
Anion gap: 10 (ref 5–15)
BUN: 21 mg/dL (ref 8–23)
CO2: 19 mmol/L — ABNORMAL LOW (ref 22–32)
Calcium: 9.9 mg/dL (ref 8.9–10.3)
Chloride: 109 mmol/L (ref 98–111)
Creatinine, Ser: 0.82 mg/dL (ref 0.44–1.00)
GFR, Estimated: >60 mL/min (ref >60)
Glucose, Bld: 287 mg/dL — ABNORMAL HIGH (ref 70–99)
Potassium: 4 mmol/L (ref 3.5–5.1)
Sodium: 138 mmol/L (ref 135–145)

## 2023-09-15 MED ORDER — OXCARBAZEPINE 150 MG PO TABS
150.0000 mg | ORAL_TABLET | Freq: Two times a day (BID) | ORAL | Status: DC
  Administered 2023-09-15 – 2023-09-18 (×6): 150 mg via ORAL
  Filled 2023-09-15 (×6): qty 1

## 2023-09-15 MED ORDER — INSULIN GLARGINE-YFGN 100 UNIT/ML ~~LOC~~ SOLN
15.0000 [IU] | Freq: Every day | SUBCUTANEOUS | Status: DC
Start: 2023-09-15 — End: 2023-09-15
  Filled 2023-09-15: qty 0.15

## 2023-09-15 MED ORDER — ARIPIPRAZOLE 5 MG PO TABS
10.0000 mg | ORAL_TABLET | Freq: Two times a day (BID) | ORAL | Status: DC
  Administered 2023-09-15 – 2023-09-29 (×28): 10 mg via ORAL
  Filled 2023-09-15 (×29): qty 2

## 2023-09-15 MED ORDER — INSULIN GLARGINE-YFGN 100 UNIT/ML ~~LOC~~ SOLN
15.0000 [IU] | Freq: Two times a day (BID) | SUBCUTANEOUS | Status: DC
  Administered 2023-09-15: 15 [IU] via SUBCUTANEOUS
  Filled 2023-09-15 (×2): qty 0.15

## 2023-09-15 NOTE — Progress Notes (Signed)
 Progress Note   Patient: Rita Sullivan JJO:[id number] DOB: 1956/05/09 DOA: 09/14/2023     1 DOS: the patient was seen and examined on 09/15/2023   Brief hospital course: HPI: Rita Sullivan is a 68 y.o. female with past medical history of IIDM presented with schizoaffective disorder.  Patient has history of insulin  independent diabetes, she confirms insulin  she takes glimepiride, metformin  and Januvia and her sugar has been fairly controlled.  During this hospitalization it was noticed patient blood sugar in the 308 sliding scale and Lantus  10 unit was added.  However patient has been refusing any insulin  injection this morning and also rejected morning dose of glimepiride.  7 2 totaled patient and stressed importance of compliance with diabetic medication regimen to avoid DKA.  Currently patient has no abdominal pain no nauseous vomiting or diarrhea and she is eating her breakfast as usual.  Blood work showed bicarb 14, mild elevation   Assessment and Plan:  IIDM with hyperglycemia Mild DKA -Secondary to noncompliant with diabetic medications I have adjusted Lantus  from 10 to 15 units twice daily Continue to monitor glucose closely No overt DKA and no need to transfer out of psych unit - Once patient's glucose better controlled, can discontinue sliding scale and leave patient on p.o. diabetic medication plus low-dose of Lantus .  We will continue to follow with you.     Schizoaffective disorder -As per primary team.     TRH will continue to follow the patient.    Subjective:  Patient seen and examined at bedside this morning She denied any acute overnight event Glucose level still on the higher side Lantus  have been increased from daily to twice daily  Physical Exam:  Eyes: PERRL, lids and conjunctivae normal ENMT: Mucous membranes are moist. Posterior pharynx clear of any exudate or lesions.Normal dentition.  Neck: normal, supple, no masses, no thyromegaly Respiratory:  clear to auscultation bilaterally, no wheezing, no crackles. Normal respiratory effort. No accessory muscle use.  Cardiovascular: Regular rate and rhythm, no murmurs / rubs / gallops. No extremity edema. 2+ pedal pulses. No carotid bruits.  Abdomen: no tenderness, no masses palpated. No hepatosplenomegaly. Bowel sounds positive.  Musculoskeletal: no clubbing / cyanosis. No joint deformity upper and lower extremities. Good ROM, no contractures. Normal muscle tone.  Skin: no rashes, lesions, ulcers. No induration Neurologic: CN 2-12 grossly intact. Sensation intact, DTR normal.  Muscle strength 5/5 on both sides   Family Communication: None at bedside Primary team communication: Psychiatry team Thank you very much for involving us  in the care of your patient.  Vitals:   09/14/23 0054 09/14/23 0725 09/14/23 1931 09/15/23 0737  BP: (!) 121/91 (!) 126/91 127/86 113/82  Pulse: (!) 113 (!) 135 (!) 142 (!) 128  Resp: 18 18    Temp:  (!) 97.2 F (36.2 C) 97.9 F (36.6 C)   TempSrc:      SpO2: 99% 99% 94% 97%  Weight:      Height:        Data Reviewed:    Latest Ref Rng & Units 09/15/2023    6:39 AM 09/14/2023    9:57 AM 09/12/2023    8:04 PM  BMP  Glucose 70 - 99 mg/dL 160  109  323   BUN 8 - 23 mg/dL 21  22  14    Creatinine 0.44 - 1.00 mg/dL 5.57  3.22  0.25   Sodium 135 - 145 mmol/L 138  135  132   Potassium 3.5 - 5.1 mmol/L  4.0  4.0  4.0   Chloride 98 - 111 mmol/L 109  105  101   CO2 22 - 32 mmol/L 19  14  19    Calcium  8.9 - 10.3 mg/dL 9.9  09.8  11.9        Latest Ref Rng & Units 09/12/2023    8:04 PM 02/26/2022   10:39 AM 10/18/2021    2:40 PM  CBC  WBC 4.0 - 10.5 K/uL 13.8  9.2  9.0   Hemoglobin 12.0 - 15.0 g/dL 14.7  82.9  56.2   Hematocrit 36.0 - 46.0 % 43.0  38.6  43.6   Platelets 150 - 400 K/uL 385  332  367      Time spent: 40 minutes  Author: Ezzard Holms, MD 09/15/2023 6:20 PM  For on call review [email or web address].

## 2023-09-15 NOTE — BH IP Treatment Plan (Signed)
 Interdisciplinary Treatment and Diagnostic Plan Update  09/15/2023 Time of Session: 10:10 AM  Rita Sullivan MRN: [id number]  Principal Diagnosis: Schizoaffective disorder, bipolar type (HCC)  Secondary Diagnoses: Principal Problem:   Schizoaffective disorder, bipolar type (HCC) Active Problems:   Auditory hallucinations   Current Medications:  Current Facility-Administered Medications  Medication Dose Route Frequency Provider Last Rate Last Admin   acetaminophen  (TYLENOL ) tablet 650 mg  650 mg Oral Q6H PRN Onuoha, Chinwendu V, NP       alum & mag hydroxide-simeth (MAALOX/MYLANTA) 200-200-20 MG/5ML suspension 15 mL  15 mL Oral Q6H PRN Onuoha, Chinwendu V, NP       ARIPiprazole  (ABILIFY ) tablet 10 mg  10 mg Oral Daily Jadapalle, Sree, MD   10 mg at 09/15/23 0941   glipiZIDE  (GLUCOTROL ) tablet 5 mg  5 mg Oral QAC breakfast Onuoha, Chinwendu V, NP   5 mg at 09/15/23 0855   insulin  aspart (novoLOG ) injection 0-15 Units  0-15 Units Subcutaneous TID WC Onuoha, Chinwendu V, NP   8 Units at 09/15/23 0846   insulin  glargine-yfgn (SEMGLEE ) injection 10 Units  10 Units Subcutaneous QHS Meegan, Eryn, RPH   10 Units at 09/14/23 2230   linagliptin  (TRADJENTA ) tablet 5 mg  5 mg Oral Daily Antoniette Batty T, MD   5 mg at 09/15/23 5621   magnesium  hydroxide (MILK OF MAGNESIA) suspension 15 mL  15 mL Oral Daily PRN Onuoha, Chinwendu V, NP       metFORMIN  (GLUCOPHAGE ) tablet 1,000 mg  1,000 mg Oral BID WC Zhang, Ping T, MD   1,000 mg at 09/15/23 3086   OLANZapine  (ZYPREXA ) injection 5 mg  5 mg Intramuscular TID PRN Onuoha, Chinwendu V, NP       OLANZapine  zydis (ZYPREXA ) disintegrating tablet 5 mg  5 mg Oral TID PRN Onuoha, Chinwendu V, NP   5 mg at 09/14/23 2045   traZODone  (DESYREL ) tablet 100 mg  100 mg Oral QHS Jadapalle, Sree, MD   100 mg at 09/14/23 2127   PTA Medications: Medications Prior to Admission  Medication Sig Dispense Refill Last Dose/Taking   ARIPiprazole  (ABILIFY ) 20 MG tablet Take 1  tablet (20 mg total) by mouth every morning. 30 tablet 1 Unknown   atorvastatin  (LIPITOR) 40 MG tablet Take 1 tablet (40 mg total) by mouth daily. 30 tablet 1 Unknown   carbamazepine  (TEGRETOL ) 200 MG tablet Take 2 tablets (400 mg total) by mouth 3 (three) times daily. 90 tablet 1 Unknown   cholecalciferol  (VITAMIN D3) 25 MCG (1000 UNIT) tablet Take 2 tablets (2,000 Units total) by mouth daily. (Patient not taking: Reported on 09/13/2023) 60 tablet 1 Unknown   folic acid  (FOLVITE ) 1 MG tablet Take 0.5 tablets (0.5 mg total) by mouth daily. (Patient not taking: Reported on 09/13/2023) 30 tablet 1 Unknown   glipiZIDE  (GLUCOTROL ) 5 MG tablet Take 1 tablet (5 mg total) by mouth daily with breakfast. 30 tablet 1 Unknown   insulin  glargine-yfgn (SEMGLEE ) 100 UNIT/ML injection Inject 0.1 mLs (10 Units total) into the skin at bedtime. (Patient not taking: Reported on 09/13/2023) 10 mL 1 Unknown   JANUVIA 50 MG tablet Take 50 mg by mouth daily.   Unknown   losartan  (COZAAR ) 25 MG tablet Take 1 tablet (25 mg total) by mouth daily. 30 tablet 1 Unknown   metFORMIN  (GLUCOPHAGE ) 500 MG tablet Take 1,000 mg by mouth 2 (two) times daily with a meal.   Unknown   traZODone  (DESYREL ) 100 MG tablet Take 1 tablet (100 mg  total) by mouth at bedtime. 30 tablet 1 Unknown    Patient Stressors: Medication change or noncompliance    Patient Strengths: Average or above average intelligence  Communication skills  Supportive family/friends   Treatment Modalities: Medication Management, Group therapy, Case management,  1 to 1 session with clinician, Psychoeducation, Recreational therapy.   Physician Treatment Plan for Primary Diagnosis: Schizoaffective disorder, bipolar type (HCC) Long Term Goal(s): Improvement in symptoms so as ready for discharge   Short Term Goals: Ability to identify changes in lifestyle to reduce recurrence of condition will improve Ability to verbalize feelings will improve Ability to disclose and  discuss suicidal ideas Ability to demonstrate self-control will improve Ability to identify and develop effective coping behaviors will improve  Medication Management: Evaluate patient's response, side effects, and tolerance of medication regimen.  Therapeutic Interventions: 1 to 1 sessions, Unit Group sessions and Medication administration.  Evaluation of Outcomes: Not Progressing  Physician Treatment Plan for Secondary Diagnosis: Principal Problem:   Schizoaffective disorder, bipolar type (HCC) Active Problems:   Auditory hallucinations  Long Term Goal(s): Improvement in symptoms so as ready for discharge   Short Term Goals: Ability to identify changes in lifestyle to reduce recurrence of condition will improve Ability to verbalize feelings will improve Ability to disclose and discuss suicidal ideas Ability to demonstrate self-control will improve Ability to identify and develop effective coping behaviors will improve     Medication Management: Evaluate patient's response, side effects, and tolerance of medication regimen.  Therapeutic Interventions: 1 to 1 sessions, Unit Group sessions and Medication administration.  Evaluation of Outcomes: Not Progressing   RN Treatment Plan for Primary Diagnosis: Schizoaffective disorder, bipolar type (HCC) Long Term Goal(s): Knowledge of disease and therapeutic regimen to maintain health will improve  Short Term Goals: Ability to remain free from injury will improve, Ability to verbalize frustration and anger appropriately will improve, Ability to demonstrate self-control, Ability to participate in decision making will improve, Ability to verbalize feelings will improve, Ability to disclose and discuss suicidal ideas, Ability to identify and develop effective coping behaviors will improve, and Compliance with prescribed medications will improve  Medication Management: RN will administer medications as ordered by provider, will assess and  evaluate patient's response and provide education to patient for prescribed medication. RN will report any adverse and/or side effects to prescribing provider.  Therapeutic Interventions: 1 on 1 counseling sessions, Psychoeducation, Medication administration, Evaluate responses to treatment, Monitor vital signs and CBGs as ordered, Perform/monitor CIWA, COWS, AIMS and Fall Risk screenings as ordered, Perform wound care treatments as ordered.  Evaluation of Outcomes: Not Progressing   LCSW Treatment Plan for Primary Diagnosis: Schizoaffective disorder, bipolar type (HCC) Long Term Goal(s): Safe transition to appropriate next level of care at discharge, Engage patient in therapeutic group addressing interpersonal concerns.  Short Term Goals: Engage patient in aftercare planning with referrals and resources, Increase social support, Increase ability to appropriately verbalize feelings, Increase emotional regulation, Facilitate acceptance of mental health diagnosis and concerns, Facilitate patient progression through stages of change regarding substance use diagnoses and concerns, Identify triggers associated with mental health/substance abuse issues, and Increase skills for wellness and recovery  Therapeutic Interventions: Assess for all discharge needs, 1 to 1 time with Social worker, Explore available resources and support systems, Assess for adequacy in community support network, Educate family and significant other(s) on suicide prevention, Complete Psychosocial Assessment, Interpersonal group therapy.  Evaluation of Outcomes: Not Progressing   Progress in Treatment: Attending groups: Yes. and No. Participating in  groups: Yes. and No. Taking medication as prescribed: Yes. Toleration medication: Yes. Family/Significant other contact made: Yes, individual(s) contacted:  Ruperto Zolayvar, husband, [phone]  Patient understands diagnosis: No. Discussing patient identified problems/goals with  staff: No. Medical problems stabilized or resolved: Yes. Denies suicidal/homicidal ideation: Yes. Issues/concerns per patient self-inventory: No. Other: None   New problem(s) identified: No, Describe:  None identified   New Short Term/Long Term Goal(s):  elimination of symptoms of psychosis, medication management for mood stabilization; elimination of SI thoughts; development of comprehensive mental wellness plan.   Patient Goals:  "I'm not really sick, I don't really need to be here. I'm not a psychiatric patient in this hospital am I?   Discharge Plan or Barriers: CSW will assist with appropriate discharge planning   Reason for Continuation of Hospitalization: Hallucinations Medication stabilization  Estimated Length of Stay: 1 to 7 days  Last 3 [place] Suicide Severity Risk Score: Flowsheet Row Admission (Current) from 09/14/2023 in [facility] BEHAVIORAL MEDICINE ED from 09/12/2023 in [facility] Emergency Department at [facility] Admission (Discharged) from 02/26/2022 in [facility] BEH[facility]IORAL MEDICINE  C-SSRS RISK CATEGORY Moderate Risk No Risk No Risk       Last PHQ 2/9 Scores:    05/02/2015    1:50 PM  Depression screen PHQ 2/9  Decreased Interest 0  Down, Depressed, Hopeless 0  PHQ - 2 Score 0    Scribe for Treatment Team: Claudio Culver, [place] 09/15/2023 11:19 AM

## 2023-09-15 NOTE — Progress Notes (Signed)
   09/15/23 1400  Psych Admission Type (Psych Patients Only)  Admission Status Involuntary  Psychosocial Assessment  Patient Complaints Confusion;Agitation  Eye Contact Avoids  Facial Expression Animated;Anxious  Affect Anxious;Preoccupied  Speech Aggressive;Soft  Interaction Guarded;Avoidant;Minimal  Motor Activity Fidgety;Slow  Appearance/Hygiene Disheveled  Behavior Characteristics Anxious;Guarded  Mood Anxious;Preoccupied  Aggressive Behavior  Effect No apparent injury  Thought Process  Coherency Loose associations  Content WDL  Delusions None reported or observed  Perception Hallucinations  Hallucination Auditory;Command;Visual  Judgment Impaired  Danger to Self  Current suicidal ideation? Passive  Description of Suicide Plan None verbalized  Agreement Not to Harm Self Yes  Description of Agreement Verbally  Danger to Others  Danger to Others None reported or observed   Pt. Very slow to respond and is very nervous, fidgety and jumpy. Follows commands after they are repeated several times. Patient not very sociable but is cooperative.

## 2023-09-15 NOTE — [facility] Suicide Risk Assessment (Signed)
 [facility] INPATIENT:  Family/Significant Other Suicide Prevention Education  Suicide Prevention Education:  Patient Refusal for Family/Significant Other Suicide Prevention Education: The patient Rita Sullivan has refused to provide written consent for family/significant other to be provided Family/Significant Other Suicide Prevention Education during admission and/or prior to discharge.  Physician notified.  Claudio Culver 09/15/2023, 2:39 PM
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
## 2023-09-15 NOTE — Progress Notes (Signed)
 [facility] MD Progress Note  09/15/2023 11:37 AM Rita Sullivan  MRN:  [id number]  Patient is a 68 year old female with history of schizoaffective disorder, bipolar type presented with command auditory hallucinations telling her to kill self.  Patient currently admitted to geropsych unit for further stabilization  Subjective:  Chart reviewed, case discussed in multidisciplinary meeting, patient seen during rounds.  Patient is noted to be very disorganized with significant thought blocking.  She met the multidisciplinary team and was unable to engage in any meaningful conversation.  She remains delusional stating this is not a hospital and she is here for other things.  Patient is noted to be actively responding to internal stimuli and stops talking or responding to the questions, staring off sometimes.  She lacks insight into her mental health and remains superficial denying SI/HI and denying hallucinations.  Per nursing report patient is noted to be responding to internal stimuli, needs a lot of redirection and self-care including ADLs.   Sleep: Fair  Appetite:  Poor  Past Psychiatric History: see h&P Family History:  Family History  Problem Relation Age of Onset   Diabetes Father    Diabetes Sister    Social History:  Social History   Substance and Sexual Activity  Alcohol Use Yes   Alcohol/week: 2.0 standard drinks of alcohol   Types: 2 Glasses of wine per week   Comment: Occ     Social History   Substance and Sexual Activity  Drug Use No    Social History   Socioeconomic History   Marital status: Married    Spouse name: Not on file   Number of children: Not on file   Years of education: Not on file   Highest education level: Not on file  Occupational History   Not on file  Tobacco Use   Smoking status: Never   Smokeless tobacco: Never  Vaping Use   Vaping status: Not on file  Substance and Sexual Activity   Alcohol use: Yes    Alcohol/week: 2.0 standard drinks of  alcohol    Types: 2 Glasses of wine per week    Comment: Occ   Drug use: No   Sexual activity: Never  Other Topics Concern   Not on file  Social History Narrative   Not on file   Social Drivers of Health   Financial Resource Strain: Low Risk  (02/23/2023)   Received from [facility]   Overall Financial Resource Strain (CARDIA)    Difficulty of Paying Living Expenses: Not very hard  Food Insecurity: No Food Insecurity (09/14/2023)   Hunger Vital Sign    Worried About Running Out of Food in the Last Year: Never true    Ran Out of Food in the Last Year: Never true  Transportation Needs: No Transportation Needs (09/14/2023)   PRAPARE - Administrator, Civil Service (Medical): No    Lack of Transportation (Non-Medical): No  Physical Activity: Not on file  Stress: Not on file  Social Connections: Socially Isolated (09/14/2023)   Social Connection and Isolation Panel [NHANES]    Frequency of Communication with Friends and Family: Never    Frequency of Social Gatherings with Friends and Family: Never    Attends Religious Services: Never    Database administrator or Organizations: No    Attends Banker Meetings: Never    Marital Status: Married   Past Medical History:  Past Medical History:  Diagnosis Date   Bipolar disorder (  HCC)    Chronic kidney disease    Diabetes mellitus without complication (HCC)    Hypercholesteremia    Manic behavior (HCC)     Past Surgical History:  Procedure Laterality Date   COLONOSCOPY WITH PROPOFOL  N/A 03/11/2015   Procedure: COLONOSCOPY WITH PROPOFOL ;  Surgeon: Stephens Eis, MD;  Location: [facility] ENDOSCOPY;  Service: Gastroenterology;  Laterality: N/A;    Current Medications: Current Facility-Administered Medications  Medication Dose Route Frequency Provider Last Rate Last Admin   acetaminophen  (TYLENOL ) tablet 650 mg  650 mg Oral Q6H PRN Onuoha, Chinwendu V, NP       alum & mag hydroxide-simeth  (MAALOX/MYLANTA) 200-200-20 MG/5ML suspension 15 mL  15 mL Oral Q6H PRN Onuoha, Chinwendu V, NP       ARIPiprazole  (ABILIFY ) tablet 10 mg  10 mg Oral Daily Jarius Dieudonne, MD   10 mg at 09/15/23 0941   glipiZIDE  (GLUCOTROL ) tablet 5 mg  5 mg Oral QAC breakfast Onuoha, Chinwendu V, NP   5 mg at 09/15/23 0855   insulin  aspart (novoLOG ) injection 0-15 Units  0-15 Units Subcutaneous TID WC Onuoha, Chinwendu V, NP   8 Units at 09/15/23 0846   insulin  glargine-yfgn (SEMGLEE ) injection 10 Units  10 Units Subcutaneous QHS Meegan, Eryn, RPH   10 Units at 09/14/23 2230   linagliptin  (TRADJENTA ) tablet 5 mg  5 mg Oral Daily Antoniette Batty T, MD   5 mg at 09/15/23 1308   magnesium  hydroxide (MILK OF MAGNESIA) suspension 15 mL  15 mL Oral Daily PRN Onuoha, Chinwendu V, NP       metFORMIN  (GLUCOPHAGE ) tablet 1,000 mg  1,000 mg Oral BID WC Antoniette Batty T, MD   1,000 mg at 09/15/23 6578   OLANZapine  (ZYPREXA ) injection 5 mg  5 mg Intramuscular TID PRN Onuoha, Chinwendu V, NP       OLANZapine  zydis (ZYPREXA ) disintegrating tablet 5 mg  5 mg Oral TID PRN Onuoha, Chinwendu V, NP   5 mg at 09/14/23 2045   traZODone  (DESYREL ) tablet 100 mg  100 mg Oral QHS Fedra Lanter, MD   100 mg at 09/14/23 2127    Lab Results:  Results for orders placed or performed during the hospital encounter of 09/14/23 (from the past 48 hours)  Glucose, capillary     Status: Abnormal   Collection Time: 09/14/23  1:28 AM  Result Value Ref Range   Glucose-Capillary 311 (H) 70 - 99 mg/dL    Comment: Glucose reference range applies only to samples taken after fasting for at least 8 hours.  Glucose, capillary     Status: Abnormal   Collection Time: 09/14/23  7:21 AM  Result Value Ref Range   Glucose-Capillary 538 (HH) 70 - 99 mg/dL    Comment: Glucose reference range applies only to samples taken after fasting for at least 8 hours.   Comment 1 Notify RN   Glucose, capillary     Status: Abnormal   Collection Time: 09/14/23  7:52 AM  Result  Value Ref Range   Glucose-Capillary 555 (HH) 70 - 99 mg/dL    Comment: Glucose reference range applies only to samples taken after fasting for at least 8 hours.   Comment 1 Notify RN   Basic metabolic panel     Status: Abnormal   Collection Time: 09/14/23  9:57 AM  Result Value Ref Range   Sodium 135 135 - 145 mmol/L   Potassium 4.0 3.5 - 5.1 mmol/L   Chloride 105 98 - 111 mmol/L  CO2 14 (L) 22 - 32 mmol/L   Glucose, Bld 481 (H) 70 - 99 mg/dL    Comment: Glucose reference range applies only to samples taken after fasting for at least 8 hours.   BUN 22 8 - 23 mg/dL   Creatinine, Ser 9.14 0.44 - 1.00 mg/dL   Calcium  10.0 8.9 - 10.3 mg/dL   GFR, Estimated >78 >29 mL/min    Comment: (NOTE) Calculated using the CKD-EPI Creatinine Equation (2021)    Anion gap 16 (H) 5 - 15    Comment: Performed at [facility], [address] [place], [place] [address]  Glucose, capillary     Status: Abnormal   Collection Time: 09/14/23 11:21 AM  Result Value Ref Range   Glucose-Capillary 332 (H) 70 - 99 mg/dL    Comment: Glucose reference range applies only to samples taken after fasting for at least 8 hours.  Glucose, capillary     Status: Abnormal   Collection Time: 09/14/23  4:33 PM  Result Value Ref Range   Glucose-Capillary 320 (H) 70 - 99 mg/dL    Comment: Glucose reference range applies only to samples taken after fasting for at least 8 hours.  Glucose, capillary     Status: Abnormal   Collection Time: 09/14/23  7:37 PM  Result Value Ref Range   Glucose-Capillary 210 (H) 70 - 99 mg/dL    Comment: Glucose reference range applies only to samples taken after fasting for at least 8 hours.   Comment 1 Notify RN   Basic metabolic panel     Status: Abnormal   Collection Time: 09/15/23  6:39 AM  Result Value Ref Range   Sodium 138 135 - 145 mmol/L   Potassium 4.0 3.5 - 5.1 mmol/L   Chloride 109 98 - 111 mmol/L   CO2 19 (L) 22 - 32 mmol/L   Glucose, Bld 287 (H) 70 - 99 mg/dL     Comment: Glucose reference range applies only to samples taken after fasting for at least 8 hours.   BUN 21 8 - 23 mg/dL   Creatinine, Ser 0.86 0.44 - 1.00 mg/dL   Calcium  9.9 8.9 - 10.3 mg/dL   GFR, Estimated >57 >84 mL/min    Comment: (NOTE) Calculated using the CKD-EPI Creatinine Equation (2021)    Anion gap 10 5 - 15    Comment: Performed at [facility], [address] [place], [place] [address]  Glucose, capillary     Status: Abnormal   Collection Time: 09/15/23  7:33 AM  Result Value Ref Range   Glucose-Capillary 271 (H) 70 - 99 mg/dL    Comment: Glucose reference range applies only to samples taken after fasting for at least 8 hours.    Blood Alcohol level:  Lab Results  Component Value Date   ETH <10 09/12/2023   ETH <10 02/26/2022    Metabolic Disorder Labs: Lab Results  Component Value Date   HGBA1C 7.2 (H) 09/12/2023   MPG 159.94 09/12/2023   MPG 191.51 03/06/2022   Lab Results  Component Value Date   PROLACTIN 23.3 02/20/2017   PROLACTIN 3.6 (L) 07/31/2016   Lab Results  Component Value Date   CHOL 194 03/06/2022   TRIG 113 03/06/2022   HDL 63 03/06/2022   CHOLHDL 3.1 03/06/2022   VLDL 23 03/06/2022   LDLCALC 108 (H) 03/06/2022   LDLCALC 131 (H) 02/20/2017    Physical Findings: AIMS:  , ,  ,  ,    CIWA:  COWS:      Psychiatric Specialty Exam:  Presentation  General Appearance:  Bizarre; Disheveled  Eye Contact: Fleeting  Speech: Blocked  Speech Volume: Decreased    Mood and Affect  Mood: Irritable  Affect: Flat   Thought Process  Thought Processes: Disorganized  Descriptions of Associations:Loose  Orientation:Partial  Thought Content:Illogical; Paranoid Ideation; Delusions  Hallucinations:Hallucinations: Auditory Description of Auditory Hallucinations: command type  Ideas of Reference:Paranoia; Delusions  Suicidal Thoughts:Suicidal Thoughts: No  Homicidal Thoughts:Homicidal Thoughts:  No   Sensorium  Memory: -- (unable to asses as pt is thought blocking)  Judgment: Impaired  Insight: Shallow   Executive Functions  Concentration: Poor  Attention Span: Poor  Recall: Poor  Fund of Knowledge: Poor  Language: Poor   Psychomotor Activity  Psychomotor Activity: Psychomotor Activity: Normal  Musculoskeletal: Strength & Muscle Tone: within normal limits Gait & Station: normal Assets  Assets: Manufacturing systems engineer; Desire for Improvement; Resilience; Social Support    Physical Exam: Physical Exam Vitals and nursing note reviewed.    Review of Systems  Unable to perform ROS: Psychiatric disorder   Blood pressure 113/82, pulse (!) 128, temperature 97.9 F (36.6 C), resp. rate 18, height 5\' 2"  (1.575 m), weight 71.4 kg, SpO2 97%. Body mass index is 28.81 kg/m.  Diagnosis: Principal Problem:   Schizoaffective disorder, bipolar type (HCC) Active Problems:   Auditory hallucinations   PLAN: Clinical Decision Making: Patient with history of schizoaffective disorder currently noncompliant with medication, displaying significant amount of disorganized behavior, leaving the house and wandering/driving on the streets aimlessly, endorsing command type auditory hallucinations telling her to kill herself, lacks insight and refusing treatment.  Patient is currently admitted to geropsych unit on IVC for further stabilization.   Treatment Plan Summary:   Safety and Monitoring:             -- Voluntary admission to inpatient psychiatric unit for safety, stabilization and treatment             -- Daily contact with patient to assess and evaluate symptoms and progress in treatment             -- Patient's case to be discussed in multi-disciplinary team meeting             -- Observation Level: q15 minute checks             -- Vital signs:  q12 hours             -- Precautions: suicide, elopement, and assault   2. Psychiatric Diagnoses and Treatment:           Increase    Abilify  10 mg BID and will consider mood stabilizer If patient refuses oral medication will initiate enforced medication consult     -- The risks/benefits/side-effects/alternatives to this medication were discussed in detail with the patient and time was given for questions. The patient consents to medication trial.                -- Metabolic profile and EKG monitoring obtained while on an atypical antipsychotic (BMI: Lipid Panel: HbgA1c: QTc:)              -- Encouraged patient to participate in unit milieu and in scheduled group therapies                            3. Medical Issues Being Addressed:  Hospitalist consult is made given her blood sugars more than  550 and her insulin  is adjusted   4. Discharge Planning:              -- Social work and case management to assist with discharge planning and identification of hospital follow-up needs prior to discharge             -- Estimated LOS: 5-7 days             -- Discharge Concerns: Need to establish a safety plan; Medication compliance and effectiveness             -- Discharge Goals: Return home with outpatient referrals follow ups   Physician Treatment Plan for Primary Diagnosis: Schizoaffective disorder, bipolar type (HCC) Long Term Goal(s): Improvement in symptoms so as ready for discharge   Short Term Goals: Ability to identify changes in lifestyle to reduce recurrence of condition will improve, Ability to verbalize feelings will improve, Ability to disclose and discuss suicidal ideas, Ability to demonstrate self-control will improve, and Ability to identify and develop effective coping behaviors will improve   Physician Treatment Plan for Secondary Diagnosis: Principal Problem:   Schizoaffective disorder, bipolar type (HCC) Active Problems:   Auditory hallucinations   Long Term Goal(s): Improvement in symptoms so as ready for discharge   Short Term Goals: Ability to identify changes in lifestyle to reduce  recurrence of condition will improve, Ability to verbalize feelings will improve, Ability to disclose and discuss suicidal ideas, Ability to demonstrate self-control will improve, and Ability to identify and develop effective coping behaviors will improve    Aurelia Blotter, MD 09/15/2023, 11:37 AM

## 2023-09-15 NOTE — Group Note (Signed)
 Therapy Group Note  Group Topic:Other  Group Date: 09/15/2023 Start Time: 1316 End Time: 1338 Facilitators: Jasenia Weilbacher, Otelia Blew, PT    Group Description:  Group participated with series of yoga poses, designed to emphasize functional standing balance, core stability, generalized flexibility and overall posture.  Incorporated deep breathing techniques with poses, working to promote relaxation, mindfulness and focus with targeted activities.   Discussed benefits of yoga in improving mood and self-esteem, reducing stress and anxiety, and promoting functional strength and balance for each participant.  Discussed ways to integrate into each participant's daily routine.  Provided handout with written and pictorial descriptions of included yoga movements to be utilized as appropriate outside of group time.  Therapeutic Goal(s): Demonstrate safe ability to participate with yoga poses during group activity. Identify one benefit of participation with yoga poses as part of each participant's exercise/movement routine. Identify 1-2 individual poses that participant feels most beneficial to his/her needs and that he/she can easily replicate outside of group.  Individual Participation: Pt engaged, appropriate, and participated actively throughout the session.       Participation Level: Active and Engaged   Participation Quality: Minimal Cues   Behavior: Alert, Appropriate, Attentive , and Cooperative   Speech/Thought Process: Coherent   Affect/Mood: Appropriate   Insight: Fair   Judgement: Fair   Individualization: Per above   Modes of Intervention: Activity, Discussion, and Education  Patient Response to Interventions:  Attentive, Engaged, Interested , and Receptive   Plan: Continue to engage patient in PT groups 1 - 2x/week.  Lavenia Post PT, DPT 09/15/23, 2:18 PM

## 2023-09-15 NOTE — Plan of Care (Signed)
 Patient alert and oriented x4, yet confused, delayed responses. Passive SI w/no plan. Denies HI, AVH and pain. Requires ample amount of time to respond to questions or directions. Scheduled and PRN meds administered per MAR. Support and encouragement provided.  Routine safety checks conducted every 15 minutes.  Patient informed to notify staff with problems or concerns.  Patient verbally contracts for safety at this time. Patient can be intrusive with others on the unit and requires frequent reorientation.  Patient remains safe at this time.  Problem: Education: Goal: Ability to describe self-care measures that may prevent or decrease complications (Diabetes Survival Skills Education) will improve Outcome: Not Progressing Goal: Individualized Educational Video(s) Outcome: Not Progressing   Problem: Coping: Goal: Ability to adjust to condition or change in health will improve Outcome: Not Progressing   Problem: Health Behavior/Discharge Planning: Goal: Ability to identify and utilize available resources and services will improve Outcome: Not Progressing Goal: Ability to manage health-related needs will improve Outcome: Not Progressing   Problem: Metabolic: Goal: Ability to maintain appropriate glucose levels will improve Outcome: Not Progressing   Problem: Nutritional: Goal: Maintenance of adequate nutrition will improve Outcome: Not Progressing Goal: Progress toward achieving an optimal weight will improve Outcome: Not Progressing

## 2023-09-15 NOTE — Plan of Care (Signed)
  Problem: Coping: Goal: Ability to adjust to condition or change in health will improve Outcome: Not Progressing   Problem: Health Behavior/Discharge Planning: Goal: Ability to manage health-related needs will improve Outcome: Not Progressing   Problem: Education: Goal: Ability to make informed decisions regarding treatment will improve Outcome: Not Progressing

## 2023-09-15 NOTE — Progress Notes (Signed)
   09/14/23 2045  Psych Admission Type (Psych Patients Only)  Admission Status Involuntary  Psychosocial Assessment  Patient Complaints Confusion;Agitation  Eye Contact Avoids  Facial Expression Animated;Anxious;Worried  Affect Anxious;Angry;Frightened;Irritable;Preoccupied  Speech Aggressive;Soft;Pressured  Interaction Guarded;Avoidant;Minimal  Motor Activity Fidgety;Slow  Appearance/Hygiene Disheveled  Behavior Characteristics Agitated;Anxious;Guarded;Impulsive;Intrusive;Pacing;Irritable  Mood Anxious;Preoccupied  Aggressive Behavior  Effect No apparent injury  Thought Process  Coherency WDL  Content WDL  Delusions None reported or observed  Perception WDL  Hallucination Auditory;Command  Judgment Impaired  Confusion Mild  Danger to Self  Current suicidal ideation? Passive  Description of Suicide Plan "No"  Agreement Not to Harm Self Yes  Description of Agreement Verbally  Danger to Others  Danger to Others None reported or observed

## 2023-09-16 LAB — BASIC METABOLIC PANEL WITH GFR
Anion gap: 10 (ref 5–15)
BUN: 29 mg/dL — ABNORMAL HIGH (ref 8–23)
CO2: 20 mmol/L — ABNORMAL LOW (ref 22–32)
Calcium: 9.5 mg/dL (ref 8.9–10.3)
Chloride: 109 mmol/L (ref 98–111)
Creatinine, Ser: 0.9 mg/dL (ref 0.44–1.00)
GFR, Estimated: >60 mL/min (ref >60)
Glucose, Bld: 299 mg/dL — ABNORMAL HIGH (ref 70–99)
Potassium: 3.8 mmol/L (ref 3.5–5.1)
Sodium: 139 mmol/L (ref 135–145)

## 2023-09-16 LAB — GLUCOSE, CAPILLARY
Glucose-Capillary: 279 mg/dL — ABNORMAL HIGH (ref 70–99)
Glucose-Capillary: 298 mg/dL — ABNORMAL HIGH (ref 70–99)
Glucose-Capillary: 171 mg/dL — ABNORMAL HIGH (ref 70–99)
Glucose-Capillary: 285 mg/dL — ABNORMAL HIGH (ref 70–99)

## 2023-09-16 MED ORDER — OXCARBAZEPINE 150 MG PO TABS: 150.0000 mg | ORAL_TABLET | Freq: Two times a day (BID) | ORAL | Status: DC

## 2023-09-16 MED ORDER — INSULIN GLARGINE-YFGN 100 UNIT/ML ~~LOC~~ SOLN
20.0000 [IU] | Freq: Two times a day (BID) | SUBCUTANEOUS | Status: DC
  Administered 2023-09-16 – 2023-09-17 (×3): 20 [IU] via SUBCUTANEOUS
  Filled 2023-09-16 (×4): qty 0.2

## 2023-09-16 NOTE — Group Note (Signed)
 Date:  09/16/2023 Time:  11:15 AM  Group Topic/Focus:  Overcoming Stress:   The focus of this group is to define stress and help patients assess their triggers.    Participation Level:  Active  Participation Quality:  Appropriate  Affect:  Appropriate  Cognitive:  Appropriate  Insight: Appropriate  Engagement in Group:  Engaged  Modes of Intervention:  Discussion   Dow Gemma 09/16/2023, 11:15 AM

## 2023-09-16 NOTE — Progress Notes (Signed)
 Progress Note   Patient: Rita Sullivan ZOX:[id number] DOB: [birth year] DOA: 09/14/2023     2 DOS: the patient was seen and examined on 09/16/2023     Brief hospital course: HPI: Rita Sullivan is a 68 y.o. female with past medical history of IIDM presented with schizoaffective disorder.  Patient has history of insulin  independent diabetes, she confirms insulin  she takes glimepiride, metformin  and Januvia and her sugar has been fairly controlled.  During this hospitalization it was noticed patient blood sugar in the 308 sliding scale and Lantus  10 unit was added.  However patient has been refusing any insulin  injection this morning and also rejected morning dose of glimepiride.  7 2 totaled patient and stressed importance of compliance with diabetic medication regimen to avoid DKA.  Currently patient has no abdominal pain no nauseous vomiting or diarrhea and she is eating her breakfast as usual.  Blood work showed bicarb 14, mild elevation    Assessment and Plan:   IIDM with hyperglycemia Mild DKA -Secondary to noncompliant with diabetic medications Lantus  increased to 20 twice daily today Glucose level improving Continue to monitor glucose closely No overt DKA and no need to transfer out of psych unit     Schizoaffective disorder -As per primary team.     TRH will continue to follow the patient.     Subjective:  Patient seen and examined at bedside this morning Denied worsening shortness of breath abdominal pain or chest pain   Physical Exam:   Eyes: PERRL, lids and conjunctivae normal ENMT: Mucous membranes are moist. Posterior pharynx clear of any exudate or lesions.Normal dentition.  Neck: normal, supple, no masses, no thyromegaly Respiratory: clear to auscultation bilaterally, no wheezing, no crackles. Normal respiratory effort. No accessory muscle use.  Cardiovascular: Regular rate and rhythm, no murmurs / rubs / gallops. No extremity edema. 2+ pedal pulses. No  carotid bruits.  Abdomen: no tenderness, no masses palpated. No hepatosplenomegaly. Bowel sounds positive.  Musculoskeletal: no clubbing / cyanosis. No joint deformity upper and lower extremities. Good ROM, no contractures. Normal muscle tone.  Skin: no rashes, lesions, ulcers. No induration Neurologic: CN 2-12 grossly intact. Sensation intact, DTR normal.  Muscle strength 5/5 on both sides     Family Communication: None at bedside Primary team communication: Psychiatry team Thank you very much for involving us  in the care of your patient.    Data Reviewed:    Latest Ref Rng & Units 09/12/2023    8:04 PM 02/26/2022   10:39 AM 10/18/2021    2:40 PM  CBC  WBC 4.0 - 10.5 K/uL 13.8  9.2  9.0   Hemoglobin 12.0 - 15.0 g/dL 81.1  91.4  78.2   Hematocrit 36.0 - 46.0 % 43.0  38.6  43.6   Platelets 150 - 400 K/uL 385  332  367        Latest Ref Rng & Units 09/16/2023    7:01 AM 09/15/2023    6:39 AM 09/14/2023    9:57 AM  BMP  Glucose 70 - 99 mg/dL 956  213  086   BUN 8 - 23 mg/dL 29  21  22    Creatinine 0.44 - 1.00 mg/dL 5.78  4.69  6.29   Sodium 135 - 145 mmol/L 139  138  135   Potassium 3.5 - 5.1 mmol/L 3.8  4.0  4.0   Chloride 98 - 111 mmol/L 109  109  105   CO2 22 - 32 mmol/L 20  19  14   Calcium  8.9 - 10.3 mg/dL 9.5  9.9  96.0      Vitals:   09/14/23 1931 09/15/23 0737 09/15/23 1930 09/16/23 0748  BP: 127/86 113/82 104/63 106/78  Pulse: (!) 142 (!) 128 (!) 108 (!) 119  Resp:   17 18  Temp: 97.9 F (36.6 C)  98.4 F (36.9 C) 97.7 F (36.5 C)  TempSrc:      SpO2: 94% 97% 99% 99%  Weight:      Height:         Author: Ezzard Holms, MD 09/16/2023 5:11 PM  For on call review [email or web address].

## 2023-09-16 NOTE — Progress Notes (Signed)
 [facility] MD Progress Note  09/16/2023 11:10 AM Rita Sullivan  MRN:  [id number]  Patient is a 68 year old female with history of schizoaffective disorder, bipolar type presented with command auditory hallucinations telling her to kill self.  Patient currently admitted to geropsych unit for further stabilization  Subjective:  Chart reviewed, case discussed in multidisciplinary meeting, patient seen during rounds.  Patient is noted to be resting in her room. She states that its not a hospital and that she doesn't have mental health problems.  Provided discussed about her previous trial of medications.  She reports taking Depakote in the past and got furious about the side effects of weight gain that she suffered through.  She absolutely did not want to try Depakote.  We talked about Abilify  maintainer she informed that it was too costly and her insurance did not cover it.  She prefers to take pills.  She denies hallucinations but throughout the interview she is display significant thought blocking and responding to internal stimuli.  Provider had to ask questions multiple times for the patient to stay focused.  She is not endorsing any suicidal ideation or homicidal ideation.  She is taking her medications.  Sleep: Fair  Appetite:  Poor  Past Psychiatric History: see h&P Family History:  Family History  Problem Relation Age of Onset   Diabetes Father    Diabetes Sister    Social History:  Social History   Substance and Sexual Activity  Alcohol Use Yes   Alcohol/week: 2.0 standard drinks of alcohol   Types: 2 Glasses of wine per week   Comment: Occ     Social History   Substance and Sexual Activity  Drug Use No    Social History   Socioeconomic History   Marital status: Married    Spouse name: Not on file   Number of children: Not on file   Years of education: Not on file   Highest education level: Not on file  Occupational History   Not on file  Tobacco Use   Smoking status:  Never   Smokeless tobacco: Never  Vaping Use   Vaping status: Not on file  Substance and Sexual Activity   Alcohol use: Yes    Alcohol/week: 2.0 standard drinks of alcohol    Types: 2 Glasses of wine per week    Comment: Occ   Drug use: No   Sexual activity: Never  Other Topics Concern   Not on file  Social History Narrative   Not on file   Social Drivers of Health   Financial Resource Strain: Low Risk  (02/23/2023)   Received from [facility]   Overall Financial Resource Strain (CARDIA)    Difficulty of Paying Living Expenses: Not very hard  Food Insecurity: No Food Insecurity (09/14/2023)   Hunger Vital Sign    Worried About Running Out of Food in the Last Year: Never true    Ran Out of Food in the Last Year: Never true  Transportation Needs: No Transportation Needs (09/14/2023)   PRAPARE - Administrator, Civil Service (Medical): No    Lack of Transportation (Non-Medical): No  Physical Activity: Not on file  Stress: Not on file  Social Connections: Socially Isolated (09/14/2023)   Social Connection and Isolation Panel [NHANES]    Frequency of Communication with Friends and Family: Never    Frequency of Social Gatherings with Friends and Family: Never    Attends Religious Services: Never    Active Member  of Clubs or Organizations: No    Attends Banker Meetings: Never    Marital Status: Married   Past Medical History:  Past Medical History:  Diagnosis Date   Bipolar disorder (HCC)    Chronic kidney disease    Diabetes mellitus without complication (HCC)    Hypercholesteremia    Manic behavior (HCC)     Past Surgical History:  Procedure Laterality Date   COLONOSCOPY WITH PROPOFOL  N/A 03/11/2015   Procedure: COLONOSCOPY WITH PROPOFOL ;  Surgeon: Stephens Eis, MD;  Location: [facility] ENDOSCOPY;  Service: Gastroenterology;  Laterality: N/A;    Current Medications: Current Facility-Administered Medications  Medication Dose Route  Frequency Provider Last Rate Last Admin   acetaminophen  (TYLENOL ) tablet 650 mg  650 mg Oral Q6H PRN Onuoha, Chinwendu V, NP       alum & mag hydroxide-simeth (MAALOX/MYLANTA) 200-200-20 MG/5ML suspension 15 mL  15 mL Oral Q6H PRN Onuoha, Chinwendu V, NP       ARIPiprazole  (ABILIFY ) tablet 10 mg  10 mg Oral BID Gottfried Standish, MD   10 mg at 09/16/23 0856   glipiZIDE  (GLUCOTROL ) tablet 5 mg  5 mg Oral QAC breakfast Onuoha, Chinwendu V, NP   5 mg at 09/16/23 0857   insulin  aspart (novoLOG ) injection 0-15 Units  0-15 Units Subcutaneous TID WC Onuoha, Chinwendu V, NP   8 Units at 09/16/23 0858   insulin  glargine-yfgn (SEMGLEE ) injection 20 Units  20 Units Subcutaneous BID Ezzard Holms, MD   20 Units at 09/16/23 0931   linagliptin  (TRADJENTA ) tablet 5 mg  5 mg Oral Daily Antoniette Batty T, MD   5 mg at 09/16/23 1610   magnesium  hydroxide (MILK OF MAGNESIA) suspension 15 mL  15 mL Oral Daily PRN Onuoha, Chinwendu V, NP       metFORMIN  (GLUCOPHAGE ) tablet 1,000 mg  1,000 mg Oral BID WC Antoniette Batty T, MD   1,000 mg at 09/16/23 9604   OLANZapine  (ZYPREXA ) injection 5 mg  5 mg Intramuscular TID PRN Onuoha, Chinwendu V, NP       OLANZapine  zydis (ZYPREXA ) disintegrating tablet 5 mg  5 mg Oral TID PRN Onuoha, Chinwendu V, NP   5 mg at 09/14/23 2045   OXcarbazepine  (TRILEPTAL ) tablet 150 mg  150 mg Oral BID Ghali Morissette, MD   150 mg at 09/16/23 5409   traZODone  (DESYREL ) tablet 100 mg  100 mg Oral QHS Shahin Knierim, MD   100 mg at 09/15/23 2144    Lab Results:  Results for orders placed or performed during the hospital encounter of 09/14/23 (from the past 48 hours)  Glucose, capillary     Status: Abnormal   Collection Time: 09/14/23 11:21 AM  Result Value Ref Range   Glucose-Capillary 332 (H) 70 - 99 mg/dL    Comment: Glucose reference range applies only to samples taken after fasting for at least 8 hours.  Glucose, capillary     Status: Abnormal   Collection Time: 09/14/23  4:33 PM  Result Value Ref  Range   Glucose-Capillary 320 (H) 70 - 99 mg/dL    Comment: Glucose reference range applies only to samples taken after fasting for at least 8 hours.  Glucose, capillary     Status: Abnormal   Collection Time: 09/14/23  7:37 PM  Result Value Ref Range   Glucose-Capillary 210 (H) 70 - 99 mg/dL    Comment: Glucose reference range applies only to samples taken after fasting for at least 8 hours.   Comment  1 Notify RN   Basic metabolic panel     Status: Abnormal   Collection Time: 09/15/23  6:39 AM  Result Value Ref Range   Sodium 138 135 - 145 mmol/L   Potassium 4.0 3.5 - 5.1 mmol/L   Chloride 109 98 - 111 mmol/L   CO2 19 (L) 22 - 32 mmol/L   Glucose, Bld 287 (H) 70 - 99 mg/dL    Comment: Glucose reference range applies only to samples taken after fasting for at least 8 hours.   BUN 21 8 - 23 mg/dL   Creatinine, Ser 1.61 0.44 - 1.00 mg/dL   Calcium  9.9 8.9 - 10.3 mg/dL   GFR, Estimated >09 >60 mL/min    Comment: (NOTE) Calculated using the CKD-EPI Creatinine Equation (2021)    Anion gap 10 5 - 15    Comment: Performed at [facility], [address] [place], [place] [address]  Glucose, capillary     Status: Abnormal   Collection Time: 09/15/23  7:33 AM  Result Value Ref Range   Glucose-Capillary 271 (H) 70 - 99 mg/dL    Comment: Glucose reference range applies only to samples taken after fasting for at least 8 hours.  Glucose, capillary     Status: Abnormal   Collection Time: 09/15/23 12:03 PM  Result Value Ref Range   Glucose-Capillary 328 (H) 70 - 99 mg/dL    Comment: Glucose reference range applies only to samples taken after fasting for at least 8 hours.  Glucose, capillary     Status: Abnormal   Collection Time: 09/15/23  4:10 PM  Result Value Ref Range   Glucose-Capillary 310 (H) 70 - 99 mg/dL    Comment: Glucose reference range applies only to samples taken after fasting for at least 8 hours.  Glucose, capillary     Status: Abnormal   Collection Time:  09/15/23  8:15 PM  Result Value Ref Range   Glucose-Capillary 200 (H) 70 - 99 mg/dL    Comment: Glucose reference range applies only to samples taken after fasting for at least 8 hours.  Basic metabolic panel     Status: Abnormal   Collection Time: 09/16/23  7:01 AM  Result Value Ref Range   Sodium 139 135 - 145 mmol/L   Potassium 3.8 3.5 - 5.1 mmol/L   Chloride 109 98 - 111 mmol/L   CO2 20 (L) 22 - 32 mmol/L   Glucose, Bld 299 (H) 70 - 99 mg/dL    Comment: Glucose reference range applies only to samples taken after fasting for at least 8 hours.   BUN 29 (H) 8 - 23 mg/dL   Creatinine, Ser 8.11 0.44 - 1.00 mg/dL   Calcium  9.5 8.9 - 10.3 mg/dL   GFR, Estimated >91 >47 mL/min    Comment: (NOTE) Calculated using the CKD-EPI Creatinine Equation (2021)    Anion gap 10 5 - 15    Comment: Performed at [facility], [address] [place], [place] [address]  Glucose, capillary     Status: Abnormal   Collection Time: 09/16/23  7:36 AM  Result Value Ref Range   Glucose-Capillary 279 (H) 70 - 99 mg/dL    Comment: Glucose reference range applies only to samples taken after fasting for at least 8 hours.    Blood Alcohol level:  Lab Results  Component Value Date   [facility] <10 09/12/2023   ETH <10 02/26/2022    Metabolic Disorder Labs: Lab Results  Component Value Date  HGBA1C 7.2 (H) 09/12/2023   MPG 159.94 09/12/2023   MPG 191.51 03/06/2022   Lab Results  Component Value Date   PROLACTIN 23.3 02/20/2017   PROLACTIN 3.6 (L) 07/31/2016   Lab Results  Component Value Date   CHOL 194 03/06/2022   TRIG 113 03/06/2022   HDL 63 03/06/2022   CHOLHDL 3.1 03/06/2022   VLDL 23 03/06/2022   LDLCALC 108 (H) 03/06/2022   LDLCALC 131 (H) 02/20/2017    Physical Findings: AIMS:  , ,  ,  ,    CIWA:    COWS:      Psychiatric Specialty Exam:  Presentation  General Appearance:  Bizarre; Disheveled  Eye Contact: Minimal  Speech: Blocked  Speech  Volume: Decreased    Mood and Affect  Mood: Irritable  Affect: Labile   Thought Process  Thought Processes: Disorganized  Descriptions of Associations:Loose  Orientation:Partial  Thought Content:Illogical; Delusions; Paranoid Ideation  Hallucinations:denies Ideas of Reference:Delusions; Paranoia  Suicidal Thoughts:Suicidal Thoughts: No  Homicidal Thoughts:Homicidal Thoughts: No   Sensorium  Memory: Immediate Fair; Recent Fair; Remote Poor  Judgment: Impaired  Insight: Shallow   Executive Functions  Concentration: Poor  Attention Span: Poor  Recall: Poor  Fund of Knowledge: Poor  Language: Fair   Psychomotor Activity  Psychomotor Activity: Psychomotor Activity: Normal  Musculoskeletal: Strength & Muscle Tone: within normal limits Gait & Station: normal Assets  Assets: Physical Health; Resilience; Social Support    Physical Exam: Physical Exam Vitals and nursing note reviewed.    Review of Systems  Unable to perform ROS: Psychiatric disorder   Blood pressure 106/78, pulse (!) 119, temperature 97.7 F (36.5 C), resp. rate 18, height 5\' 2"  (1.575 m), weight 71.4 kg, SpO2 99%. Body mass index is 28.81 kg/m.  Diagnosis: Principal Problem:   Schizoaffective disorder, bipolar type (HCC) Active Problems:   Auditory hallucinations   PLAN: Clinical Decision Making: Patient with history of schizoaffective disorder currently noncompliant with medication, displaying significant amount of disorganized behavior, leaving the house and wandering/driving on the streets aimlessly, endorsing command type auditory hallucinations telling her to kill herself, lacks insight and refusing treatment.  Patient is currently admitted to geropsych unit on IVC for further stabilization.   Treatment Plan Summary:   Safety and Monitoring:             -- Voluntary admission to inpatient psychiatric unit for safety, stabilization and treatment              -- Daily contact with patient to assess and evaluate symptoms and progress in treatment             -- Patient's case to be discussed in multi-disciplinary team meeting             -- Observation Level: q15 minute checks             -- Vital signs:  q12 hours             -- Precautions: suicide, elopement, and assault   2. Psychiatric Diagnoses and Treatment:             Abilify  10 mg BID  Will add Trileptal  150 mg bID for mood stabilization If patient refuses oral medication will initiate enforced medication consult     -- The risks/benefits/side-effects/alternatives to this medication were discussed in detail with the patient and time was given for questions. The patient consents to medication trial.                --  Metabolic profile and EKG monitoring obtained while on an atypical antipsychotic (BMI: Lipid Panel: HbgA1c: QTc:)              -- Encouraged patient to participate in unit milieu and in scheduled group therapies                            3. Medical Issues Being Addressed:  Hospitalist consult is made given her blood sugars more than 550 and her insulin  is adjusted   4. Discharge Planning:              -- Social work and case management to assist with discharge planning and identification of hospital follow-up needs prior to discharge             -- Estimated LOS: 5-7 days             -- Discharge Concerns: Need to establish a safety plan; Medication compliance and effectiveness             -- Discharge Goals: Return home with outpatient referrals follow ups   Physician Treatment Plan for Primary Diagnosis: Schizoaffective disorder, bipolar type (HCC) Long Term Goal(s): Improvement in symptoms so as ready for discharge   Short Term Goals: Ability to identify changes in lifestyle to reduce recurrence of condition will improve, Ability to verbalize feelings will improve, Ability to disclose and discuss suicidal ideas, Ability to demonstrate self-control will improve, and  Ability to identify and develop effective coping behaviors will improve   Physician Treatment Plan for Secondary Diagnosis: Principal Problem:   Schizoaffective disorder, bipolar type (HCC) Active Problems:   Auditory hallucinations   Long Term Goal(s): Improvement in symptoms so as ready for discharge   Short Term Goals: Ability to identify changes in lifestyle to reduce recurrence of condition will improve, Ability to verbalize feelings will improve, Ability to disclose and discuss suicidal ideas, Ability to demonstrate self-control will improve, and Ability to identify and develop effective coping behaviors will improve    Aurelia Blotter, MD 09/16/2023, 11:10 AM

## 2023-09-16 NOTE — BH Assessment (Signed)
 Recreation Therapy Notes  INPATIENT RECREATION THERAPY ASSESSMENT  Patient Details Name: Rita Sullivan MRN: [id number] DOB: May 16, 1956 Today's Date: 09/16/2023       Able to Participate in Assessment/Interview: No   Deatrice Factor 09/16/2023, 9:02 AM

## 2023-09-16 NOTE — Progress Notes (Signed)
   09/16/23 2000  Psych Admission Type (Psych Patients Only)  Admission Status Involuntary  Psychosocial Assessment  Patient Complaints Confusion;Disorientation  Eye Contact Brief  Facial Expression Anxious  Affect Anxious  Speech Soft  Interaction Isolative  Motor Activity Pacing;Fidgety  Appearance/Hygiene In scrubs  Behavior Characteristics Anxious  Mood Anxious  Thought Process  Coherency Disorganized  Content WDL  Delusions None reported or observed  Perception Hallucinations  Hallucination Auditory  Judgment Impaired  Confusion Mild  Danger to Self  Current suicidal ideation? Denies  Agreement Not to Harm Self Yes  Description of Agreement Verbal  Danger to Others  Danger to Others None reported or observed

## 2023-09-16 NOTE — Plan of Care (Signed)
   Problem: Coping: Goal: Coping ability will improve Outcome: Progressing

## 2023-09-16 NOTE — Plan of Care (Signed)
   Problem: Fluid Volume: Goal: Ability to maintain a balanced intake and output will improve Outcome: Progressing   Problem: Coping: Goal: Ability to adjust to condition or change in health will improve Outcome: Progressing

## 2023-09-16 NOTE — Progress Notes (Signed)
   09/16/23 1100  Psych Admission Type (Psych Patients Only)  Admission Status Involuntary  Psychosocial Assessment  Patient Complaints Confusion  Eye Contact Brief  Facial Expression Anxious  Affect Anxious  Speech Soft  Interaction Minimal  Motor Activity Fidgety  Appearance/Hygiene In scrubs  Behavior Characteristics Anxious  Mood Anxious  Thought Process  Coherency Disorganized  Content WDL  Delusions None reported or observed  Perception Hallucinations  Hallucination Auditory  Judgment Impaired  Confusion Mild  Danger to Self  Current suicidal ideation? Denies  Agreement Not to Harm Self Yes  Description of Agreement verbal  Danger to Others  Danger to Others None reported or observed

## 2023-09-16 NOTE — Progress Notes (Signed)
 Patient was cooperative with treatment on the shift, she spent most of the evening in the dayroom with peers, she was compliant with medications. She was visible in the dayroom interacting, talking, laughing with peers. She seemed to sleep well through out the night. No new behavioral issue to report on shift at this time.  She voices passive SI, and she voice hallucination. She seemed to sleep well through out the night.

## 2023-09-16 NOTE — Group Note (Signed)
 Date:  09/16/2023 Time:  8:39 PM  Group Topic/Focus:  Making Healthy Choices:   The focus of this group is to help patients identify negative/unhealthy choices they were using prior to admission and identify positive/healthier coping strategies to replace them upon discharge.    Participation Level:  Active  Participation Quality:  Appropriate  Affect:  Appropriate  Cognitive:  Appropriate  Insight: Good  Engagement in Group:  Engaged  Modes of Intervention:  Discussion  Additional Comments:    Lynette Saras 09/16/2023, 8:39 PM

## 2023-09-16 NOTE — Group Note (Signed)
 Recreation Therapy Group Note   Group Topic:Coping Skills  Group Date: 09/16/2023 Start Time: 1400 End Time: 1500 Facilitators: Deatrice Factor, LRT, CTRS Location: Courtyard  Group Description: Outdoor Recreation. Patients had the option to play corn hole, ring toss, bowling or listening to music while outside in the courtyard getting fresh air and sunlight. LRT and patients discussed things that they enjoy doing in their free time outside of the hospital. LRT encouraged patients to drink water after being active and getting their heart rate up.   Goal Area(s) Addressed: Patient will identify leisure interests.  Patient will practice healthy decision making. Patient will engage in recreation activity.   Affect/Mood: N/A   Participation Level: Did not attend    Clinical Observations/Individualized Feedback: Patient did not attend group.   Plan: Continue to engage patient in RT group sessions 2-3x/week.   Deatrice Factor, LRT, CTRS 09/16/2023 4:55 PM

## 2023-09-17 DIAGNOSIS — F250 Schizoaffective disorder, bipolar type: Secondary | ICD-10-CM | POA: Diagnosis not present

## 2023-09-17 LAB — BASIC METABOLIC PANEL WITH GFR
Anion gap: 8 (ref 5–15)
BUN: 19 mg/dL (ref 8–23)
CO2: 19 mmol/L — ABNORMAL LOW (ref 22–32)
Calcium: 9 mg/dL (ref 8.9–10.3)
Chloride: 108 mmol/L (ref 98–111)
Creatinine, Ser: 0.77 mg/dL (ref 0.44–1.00)
GFR, Estimated: >60 mL/min (ref >60)
Glucose, Bld: 319 mg/dL — ABNORMAL HIGH (ref 70–99)
Potassium: 3.8 mmol/L (ref 3.5–5.1)
Sodium: 135 mmol/L (ref 135–145)

## 2023-09-17 LAB — GLUCOSE, CAPILLARY
Glucose-Capillary: 313 mg/dL — ABNORMAL HIGH (ref 70–99)
Glucose-Capillary: 191 mg/dL — ABNORMAL HIGH (ref 70–99)
Glucose-Capillary: 164 mg/dL — ABNORMAL HIGH (ref 70–99)
Glucose-Capillary: 274 mg/dL — ABNORMAL HIGH (ref 70–99)

## 2023-09-17 MED ORDER — INSULIN GLARGINE-YFGN 100 UNIT/ML ~~LOC~~ SOLN
25.0000 [IU] | Freq: Two times a day (BID) | SUBCUTANEOUS | Status: DC
  Administered 2023-09-17: 25 [IU] via SUBCUTANEOUS
  Filled 2023-09-17 (×2): qty 0.25

## 2023-09-17 MED ORDER — ARIPIPRAZOLE ER 400 MG IM SRER
400.0000 mg | Freq: Once | INTRAMUSCULAR | Status: AC
  Administered 2023-09-17: 400 mg via INTRAMUSCULAR
  Filled 2023-09-17: qty 2

## 2023-09-17 NOTE — Plan of Care (Signed)
  Problem: Coping: Goal: Ability to adjust to condition or change in health will improve Outcome: Progressing   Problem: Health Behavior/Discharge Planning: Goal: Ability to identify and utilize available resources and services will improve Outcome: Progressing  Patient compliant with medications appears to be responding to internal stimuli. Patient believes she is pregnant. Reoriented to place and time. Denies SI/HI. Support and encouragement provided.

## 2023-09-17 NOTE — Group Note (Signed)
 Date:  09/17/2023 Time:  1:26 PM  Group Topic/Focus:  Movie Group: The purpose of this group is for patients to pick a relaxing movie of their choice     Participation Level:  Did Not Attend  Participation Quality:    Affect:    Cognitive:    Insight:   Engagement in Group:    Modes of Intervention:    Additional Comments:  Did not attend   Adeleine Pask T Navin Dogan 09/17/2023, 1:26 PM

## 2023-09-17 NOTE — Group Note (Signed)
 Recreation Therapy Group Note   Group Topic:Leisure Education  Group Date: 09/17/2023 Start Time: 1400 End Time: 1500 Facilitators: Deatrice Factor, LRT, CTRS Location:  Dayroom  Group Description: Leisure. Patients were given the option to choose from singing karaoke, coloring mandalas, using oil pastels, journaling, or playing with play-doh. LRT and pts discussed the meaning of leisure, the importance of participating in leisure during their free time/when they're outside of the hospital, as well as how our leisure interests can also serve as coping skills.   Goal Area(s) Addressed:  Patient will identify a current leisure interest.  Patient will learn the definition of "leisure". Patient will practice making a positive decision. Patient will have the opportunity to try a new leisure activity. Patient will communicate with peers and LRT.    Affect/Mood: N/A   Participation Level: Did not attend    Clinical Observations/Individualized Feedback: Patient did not attend group.   Plan: Continue to engage patient in RT group sessions 2-3x/week.   Deatrice Factor, LRT, CTRS 09/17/2023 4:46 PM

## 2023-09-17 NOTE — Progress Notes (Signed)
   09/17/23 0732  Psych Admission Type (Psych Patients Only)  Admission Status Involuntary  Psychosocial Assessment  Patient Complaints Confusion  Eye Contact Brief  Facial Expression Anxious  Affect Anxious  Speech Soft  Interaction Isolative  Motor Activity Slow  Appearance/Hygiene In scrubs  Behavior Characteristics Calm  Mood Anxious  Thought Process  Coherency WDL  Content WDL  Delusions None reported or observed  Perception WDL  Hallucination None reported or observed  Judgment Impaired  Confusion Mild  Danger to Self  Current suicidal ideation? Denies  Danger to Others  Danger to Others None reported or observed

## 2023-09-17 NOTE — Progress Notes (Signed)
 [facility] MD Progress Note  09/17/2023 10:38 AM Rita Sullivan  MRN:  [id number]  Patient is a 68 year old female with history of schizoaffective disorder, bipolar type presented with command auditory hallucinations telling her to kill self.  Patient currently admitted to geropsych unit for further stabilization  Subjective:  Chart reviewed, case discussed in multidisciplinary meeting, patient seen during rounds.  Patient is noted to be resting in bed.  She reports that she had a decent night.  Per nursing report patient slept for 4 hours which is a slight improvement from before.  Patient continues to display thought blocking and denies hearing voices or seeing things unusual.  She denies feeling depressed.  She denies auditory/visual hallucinations.  She still takes time and responding to the questions due to thought blocking.  She reports she feels her mind is slowing down.  Provided discussed about Abilify  maintaina LAI to be able to give it to her on the unit.  Patient agreed with the plan Sleep: Fair  Appetite:  Poor  Past Psychiatric History: see h&P Family History:  Family History  Problem Relation Age of Onset   Diabetes Father    Diabetes Sister    Social History:  Social History   Substance and Sexual Activity  Alcohol Use Yes   Alcohol/week: 2.0 standard drinks of alcohol   Types: 2 Glasses of wine per week   Comment: Occ     Social History   Substance and Sexual Activity  Drug Use No    Social History   Socioeconomic History   Marital status: Married    Spouse name: Not on file   Number of children: Not on file   Years of education: Not on file   Highest education level: Not on file  Occupational History   Not on file  Tobacco Use   Smoking status: Never   Smokeless tobacco: Never  Vaping Use   Vaping status: Not on file  Substance and Sexual Activity   Alcohol use: Yes    Alcohol/week: 2.0 standard drinks of alcohol    Types: 2 Glasses of wine per week     Comment: Occ   Drug use: No   Sexual activity: Never  Other Topics Concern   Not on file  Social History Narrative   Not on file   Social Drivers of Health   Financial Resource Strain: Low Risk  (02/23/2023)   Received from [facility]   Overall Financial Resource Strain (CARDIA)    Difficulty of Paying Living Expenses: Not very hard  Food Insecurity: No Food Insecurity (09/14/2023)   Hunger Vital Sign    Worried About Running Out of Food in the Last Year: Never true    Ran Out of Food in the Last Year: Never true  Transportation Needs: No Transportation Needs (09/14/2023)   PRAPARE - Administrator, Civil Service (Medical): No    Lack of Transportation (Non-Medical): No  Physical Activity: Not on file  Stress: Not on file  Social Connections: Socially Isolated (09/14/2023)   Social Connection and Isolation Panel [NHANES]    Frequency of Communication with Friends and Family: Never    Frequency of Social Gatherings with Friends and Family: Never    Attends Religious Services: Never    Database administrator or Organizations: No    Attends Banker Meetings: Never    Marital Status: Married   Past Medical History:  Past Medical History:  Diagnosis Date   Bipolar  disorder (HCC)    Chronic kidney disease    Diabetes mellitus without complication (HCC)    Hypercholesteremia    Manic behavior (HCC)     Past Surgical History:  Procedure Laterality Date   COLONOSCOPY WITH PROPOFOL  N/A 03/11/2015   Procedure: COLONOSCOPY WITH PROPOFOL ;  Surgeon: Stephens Eis, MD;  Location: [facility] ENDOSCOPY;  Service: Gastroenterology;  Laterality: N/A;    Current Medications: Current Facility-Administered Medications  Medication Dose Route Frequency Provider Last Rate Last Admin   acetaminophen  (TYLENOL ) tablet 650 mg  650 mg Oral Q6H PRN Onuoha, Chinwendu V, NP       alum & mag hydroxide-simeth (MAALOX/MYLANTA) 200-200-20 MG/5ML suspension 15 mL  15 mL  Oral Q6H PRN Onuoha, Chinwendu V, NP       ARIPiprazole  (ABILIFY ) tablet 10 mg  10 mg Oral BID Khush Pasion, MD   10 mg at 09/17/23 0849   ARIPiprazole  ER (ABILIFY  MAINTENA) injection 400 mg  400 mg Intramuscular Once Kasey Ewings, MD       glipiZIDE  (GLUCOTROL ) tablet 5 mg  5 mg Oral QAC breakfast Onuoha, Chinwendu V, NP   5 mg at 09/17/23 0848   insulin  aspart (novoLOG ) injection 0-15 Units  0-15 Units Subcutaneous TID WC Onuoha, Chinwendu V, NP   11 Units at 09/17/23 1610   insulin  glargine-yfgn (SEMGLEE ) injection 20 Units  20 Units Subcutaneous BID Ezzard Holms, MD   20 Units at 09/17/23 9604   linagliptin  (TRADJENTA ) tablet 5 mg  5 mg Oral Daily Antoniette Batty T, MD   5 mg at 09/16/23 5409   magnesium  hydroxide (MILK OF MAGNESIA) suspension 15 mL  15 mL Oral Daily PRN Onuoha, Chinwendu V, NP       metFORMIN  (GLUCOPHAGE ) tablet 1,000 mg  1,000 mg Oral BID WC Antoniette Batty T, MD   1,000 mg at 09/17/23 0848   OLANZapine  (ZYPREXA ) injection 5 mg  5 mg Intramuscular TID PRN Onuoha, Chinwendu V, NP       OLANZapine  zydis (ZYPREXA ) disintegrating tablet 5 mg  5 mg Oral TID PRN Onuoha, Chinwendu V, NP   5 mg at 09/14/23 2045   OXcarbazepine  (TRILEPTAL ) tablet 150 mg  150 mg Oral BID Marda Breidenbach, MD   150 mg at 09/17/23 0848   traZODone  (DESYREL ) tablet 100 mg  100 mg Oral QHS Bronwen Pendergraft, MD   100 mg at 09/16/23 2117    Lab Results:  Results for orders placed or performed during the hospital encounter of 09/14/23 (from the past 48 hours)  Glucose, capillary     Status: Abnormal   Collection Time: 09/15/23 12:03 PM  Result Value Ref Range   Glucose-Capillary 328 (H) 70 - 99 mg/dL    Comment: Glucose reference range applies only to samples taken after fasting for at least 8 hours.  Glucose, capillary     Status: Abnormal   Collection Time: 09/15/23  4:10 PM  Result Value Ref Range   Glucose-Capillary 310 (H) 70 - 99 mg/dL    Comment: Glucose reference range applies only to samples  taken after fasting for at least 8 hours.  Glucose, capillary     Status: Abnormal   Collection Time: 09/15/23  8:15 PM  Result Value Ref Range   Glucose-Capillary 200 (H) 70 - 99 mg/dL    Comment: Glucose reference range applies only to samples taken after fasting for at least 8 hours.  Basic metabolic panel     Status: Abnormal   Collection Time: 09/16/23  7:01  AM  Result Value Ref Range   Sodium 139 135 - 145 mmol/L   Potassium 3.8 3.5 - 5.1 mmol/L   Chloride 109 98 - 111 mmol/L   CO2 20 (L) 22 - 32 mmol/L   Glucose, Bld 299 (H) 70 - 99 mg/dL    Comment: Glucose reference range applies only to samples taken after fasting for at least 8 hours.   BUN 29 (H) 8 - 23 mg/dL   Creatinine, Ser 4.54 0.44 - 1.00 mg/dL   Calcium  9.5 8.9 - 10.3 mg/dL   GFR, Estimated >09 >81 mL/min    Comment: (NOTE) Calculated using the CKD-EPI Creatinine Equation (2021)    Anion gap 10 5 - 15    Comment: Performed at [facility], [address] [place], [place] [address]  Glucose, capillary     Status: Abnormal   Collection Time: 09/16/23  7:36 AM  Result Value Ref Range   Glucose-Capillary 279 (H) 70 - 99 mg/dL    Comment: Glucose reference range applies only to samples taken after fasting for at least 8 hours.  Glucose, capillary     Status: Abnormal   Collection Time: 09/16/23 11:32 AM  Result Value Ref Range   Glucose-Capillary 298 (H) 70 - 99 mg/dL    Comment: Glucose reference range applies only to samples taken after fasting for at least 8 hours.  Glucose, capillary     Status: Abnormal   Collection Time: 09/16/23  4:50 PM  Result Value Ref Range   Glucose-Capillary 171 (H) 70 - 99 mg/dL    Comment: Glucose reference range applies only to samples taken after fasting for at least 8 hours.  Glucose, capillary     Status: Abnormal   Collection Time: 09/16/23  7:26 PM  Result Value Ref Range   Glucose-Capillary 285 (H) 70 - 99 mg/dL    Comment: Glucose reference range applies only  to samples taken after fasting for at least 8 hours.  Basic metabolic panel     Status: Abnormal   Collection Time: 09/17/23  7:18 AM  Result Value Ref Range   Sodium 135 135 - 145 mmol/L   Potassium 3.8 3.5 - 5.1 mmol/L   Chloride 108 98 - 111 mmol/L   CO2 19 (L) 22 - 32 mmol/L   Glucose, Bld 319 (H) 70 - 99 mg/dL    Comment: Glucose reference range applies only to samples taken after fasting for at least 8 hours.   BUN 19 8 - 23 mg/dL   Creatinine, Ser 8.29 0.44 - 1.00 mg/dL   Calcium  9.0 8.9 - 10.3 mg/dL   GFR, Estimated >56 >21 mL/min    Comment: (NOTE) Calculated using the CKD-EPI Creatinine Equation (2021)    Anion gap 8 5 - 15    Comment: Performed at [facility], [address] [place], [place] [address]  Glucose, capillary     Status: Abnormal   Collection Time: 09/17/23  7:26 AM  Result Value Ref Range   Glucose-Capillary 313 (H) 70 - 99 mg/dL    Comment: Glucose reference range applies only to samples taken after fasting for at least 8 hours.    Blood Alcohol level:  Lab Results  Component Value Date   [facility] <10 09/12/2023   ETH <10 02/26/2022    Metabolic Disorder Labs: Lab Results  Component Value Date   HGBA1C 7.2 (H) 09/12/2023   MPG 159.94 09/12/2023   MPG 191.51 03/06/2022   Lab Results  Component Value  Date   PROLACTIN 23.3 02/20/2017   PROLACTIN 3.6 (L) 07/31/2016   Lab Results  Component Value Date   CHOL 194 03/06/2022   TRIG 113 03/06/2022   HDL 63 03/06/2022   CHOLHDL 3.1 03/06/2022   VLDL 23 03/06/2022   LDLCALC 108 (H) 03/06/2022   LDLCALC 131 (H) 02/20/2017    Physical Findings: AIMS:  , ,  ,  ,    CIWA:    COWS:      Psychiatric Specialty Exam:  Presentation  General Appearance:  Bizarre  Eye Contact: Fleeting  Speech: Blocked  Speech Volume: Decreased    Mood and Affect  Mood: Irritable  Affect: Labile   Thought Process  Thought Processes: Disorganized  Descriptions of  Associations:Loose  Orientation:Partial  Thought Content:Illogical; Paranoid Ideation  Hallucinations:denies Ideas of Reference:Delusions  Suicidal Thoughts:Suicidal Thoughts: No  Homicidal Thoughts:Homicidal Thoughts: No   Sensorium  Memory: Immediate Fair; Recent Poor; Remote Poor  Judgment: Impaired  Insight: Shallow   Executive Functions  Concentration: Poor  Attention Span: Poor  Recall: Poor  Fund of Knowledge: Poor  Language: Fair   Psychomotor Activity  Psychomotor Activity: Psychomotor Activity: Normal  Musculoskeletal: Strength & Muscle Tone: within normal limits Gait & Station: normal Assets  Assets: Manufacturing systems engineer; Desire for Improvement; Resilience; Social Support    Physical Exam: Physical Exam Vitals and nursing note reviewed.    Review of Systems  Unable to perform ROS: Psychiatric disorder   Blood pressure 104/79, pulse 99, temperature (!) 97.5 F (36.4 C), resp. rate 18, height 5\' 2"  (1.575 m), weight 71.4 kg, SpO2 99%. Body mass index is 28.81 kg/m.  Diagnosis: Principal Problem:   Schizoaffective disorder, bipolar type (HCC) Active Problems:   Auditory hallucinations   PLAN: Clinical Decision Making: Patient with history of schizoaffective disorder currently noncompliant with medication, displaying significant amount of disorganized behavior, leaving the house and wandering/driving on the streets aimlessly, endorsing command type auditory hallucinations telling her to kill herself, lacks insight and refusing treatment.  Patient is currently admitted to geropsych unit on IVC for further stabilization.   Treatment Plan Summary:   Safety and Monitoring:             -- Voluntary admission to inpatient psychiatric unit for safety, stabilization and treatment             -- Daily contact with patient to assess and evaluate symptoms and progress in treatment             -- Patient's case to be discussed in  multi-disciplinary team meeting             -- Observation Level: q15 minute checks             -- Vital signs:  q12 hours             -- Precautions: suicide, elopement, and assault   2. Psychiatric Diagnoses and Treatment:             Abilify  10 mg BID  Abilify  maintaina 400 mg IM/ on 4 /25/25 Trileptal  150 mg bID for mood stabilization If patient refuses oral medication will initiate enforced medication consult     -- The risks/benefits/side-effects/alternatives to this medication were discussed in detail with the patient and time was given for questions. The patient consents to medication trial.                -- Metabolic profile and EKG monitoring obtained while on an atypical antipsychotic (BMI:  Lipid Panel: HbgA1c: QTc:)              -- Encouraged patient to participate in unit milieu and in scheduled group therapies                            3. Medical Issues Being Addressed:  Hospitalist consult is made given her blood sugars more than 550 and her insulin  is adjusted   4. Discharge Planning:              -- Social work and case management to assist with discharge planning and identification of hospital follow-up needs prior to discharge             -- Estimated LOS: 5-7 days             -- Discharge Concerns: Need to establish a safety plan; Medication compliance and effectiveness             -- Discharge Goals: Return home with outpatient referrals follow ups   Physician Treatment Plan for Primary Diagnosis: Schizoaffective disorder, bipolar type (HCC) Long Term Goal(s): Improvement in symptoms so as ready for discharge   Short Term Goals: Ability to identify changes in lifestyle to reduce recurrence of condition will improve, Ability to verbalize feelings will improve, Ability to disclose and discuss suicidal ideas, Ability to demonstrate self-control will improve, and Ability to identify and develop effective coping behaviors will improve   Physician Treatment Plan for  Secondary Diagnosis: Principal Problem:   Schizoaffective disorder, bipolar type (HCC) Active Problems:   Auditory hallucinations   Long Term Goal(s): Improvement in symptoms so as ready for discharge   Short Term Goals: Ability to identify changes in lifestyle to reduce recurrence of condition will improve, Ability to verbalize feelings will improve, Ability to disclose and discuss suicidal ideas, Ability to demonstrate self-control will improve, and Ability to identify and develop effective coping behaviors will improve    Aurelia Blotter, MD 09/17/2023, 10:38 AM

## 2023-09-17 NOTE — Progress Notes (Signed)
 Progress Note   Patient: Rita Sullivan ZOX:[id number] DOB: 08/18/1955 DOA: 09/14/2023     3 DOS: the patient was seen and examined on 09/17/2023     Brief hospital course: HPI: Rita Sullivan is a 68 y.o. female with past medical history of IIDM presented with schizoaffective disorder.  Patient has history of insulin  independent diabetes, she confirms insulin  she takes glimepiride, metformin  and Januvia and her sugar has been fairly controlled.  During this hospitalization it was noticed patient blood sugar in the 308 sliding scale and Lantus  10 unit was added.  However patient has been refusing any insulin  injection this morning and also rejected morning dose of glimepiride.  7 2 totaled patient and stressed importance of compliance with diabetic medication regimen to avoid DKA.  Currently patient has no abdominal pain no nauseous vomiting or diarrhea and she is eating her breakfast as usual.  Blood work showed bicarb 14, mild elevation    Assessment and Plan:   Diabetes mellitus type 2 with hyperglycemia -Secondary to noncompliant with diabetic medications Lantus  increased to 25 units twice a day Glucose level improving Continue to monitor glucose closely No overt DKA and no need to transfer out of psych unit     Schizoaffective disorder -As per primary team.     TRH will continue to follow the patient.     Subjective:  Seen and examined Denies any acute complaints Blood glucose level improving with adjusted dosing of Lantus   Physical Exam:   Eyes: PERRL, lids and conjunctivae normal ENMT: Mucous membranes are moist. Posterior pharynx clear of any exudate or lesions.Normal dentition.  Neck: normal, supple, no masses, no thyromegaly Respiratory: clear to auscultation bilaterally, no wheezing, no crackles. Normal respiratory effort. No accessory muscle use.  Cardiovascular: Regular rate and rhythm, no murmurs / rubs / gallops. No extremity edema. 2+ pedal pulses. No  carotid bruits.  Abdomen: no tenderness, no masses palpated. No hepatosplenomegaly. Bowel sounds positive.  Musculoskeletal: no clubbing / cyanosis. No joint deformity upper and lower extremities. Good ROM, no contractures. Normal muscle tone.  Skin: no rashes, lesions, ulcers. No induration Neurologic: CN 2-12 grossly intact. Sensation intact, DTR normal.  Muscle strength 5/5 on both sides     Family Communication: None at bedside Primary team communication: Psychiatry team  Thank you very much for involving us  in the care of your patient.     Data Reviewed:  Have reviewed patient's fingerstick glucose levels  Vitals:   09/15/23 1930 09/16/23 0748 09/16/23 1939 09/17/23 0732  BP: 104/63 106/78 125/86 104/79  Pulse: (!) 108 (!) 119 (!) 112 99  Resp: 17 18 18 18   Temp: 98.4 F (36.9 C) 97.7 F (36.5 C) (!) 97.3 F (36.3 C) (!) 97.5 F (36.4 C)  TempSrc:      SpO2: 99% 99% 99% 99%  Weight:      Height:          Latest Ref Rng & Units 09/12/2023    8:04 PM 02/26/2022   10:39 AM 10/18/2021    2:40 PM  CBC  WBC 4.0 - 10.5 K/uL 13.8  9.2  9.0   Hemoglobin 12.0 - 15.0 g/dL 81.1  91.4  78.2   Hematocrit 36.0 - 46.0 % 43.0  38.6  43.6   Platelets 150 - 400 K/uL 385  332  367        Latest Ref Rng & Units 09/17/2023    7:18 AM 09/16/2023    7:01 AM 09/15/2023  6:39 AM  BMP  Glucose 70 - 99 mg/dL 161  096  045   BUN 8 - 23 mg/dL 19  29  21    Creatinine 0.44 - 1.00 mg/dL 4.09  8.11  9.14   Sodium 135 - 145 mmol/L 135  139  138   Potassium 3.5 - 5.1 mmol/L 3.8  3.8  4.0   Chloride 98 - 111 mmol/L 108  109  109   CO2 22 - 32 mmol/L 19  20  19    Calcium  8.9 - 10.3 mg/dL 9.0  9.5  9.9      Author: Ezzard Holms, MD 09/17/2023 5:28 PM  For on call review [email or web address].

## 2023-09-17 NOTE — Group Note (Signed)
 Date:  09/17/2023 Time:  9:15 PM  Group Topic/Focus:  Wrap-Up Group:   The focus of this group is to help patients review their daily goal of treatment and discuss progress on daily workbooks.    Participation Level:  Active  Participation Quality:  Appropriate  Affect:  Appropriate  Cognitive:  Oriented  Insight: Good  Engagement in Group:  Engaged  Modes of Intervention:  Discussion  Additional Comments:    Rolland Cline 09/17/2023, 9:15 PM

## 2023-09-17 NOTE — Group Note (Signed)
 Therapy Group Note  Group Topic: Upper Extremity Theraband Exercises Group Date: 09/17/2023 Start Time: 1305 End Time: 1345 Facilitators: Hilma Lucks, OT     Group Description: Group instructed in series of upper extremities exercises, aimed to promote strength, flexibility, range of motion and functional endurance.  Patients provided cuing for proper mechanics and proper pace of exercise; exercises adjusted as necessary for individualized patient needs.  Patient also engaged in cognitive components throughout session, working to integrate attention to task, command following, turn-taking and appropriate social interaction throughout session.  Allowed to ask questions as appropriate, and encouraged to identify specific exercises that they could complete independently outside of group sessions.   Therapeutic Goal(s): Demonstrate appropriate performance of upper extremity exercises to promote strength, flexibility, range of motion and functional endurance Identify 2-3 specific upper extremity exercises to complete as home exercise program outside of group session   Individual Participation: Pt did not attend.    Participation Level: Pt did not attend.   Participation Quality:   Behavior:   Speech/Thought Process:   Affect/Mood:   Insight:   Judgement:   Modes of Intervention:   Patient Response to Interventions:    Plan: Continue to engage patient in OT groups 1-2x/week.  Rita Poage R., MPH, MS, OTR/L ascom [phone] 09/17/23, 2:39 PM

## 2023-09-18 LAB — BASIC METABOLIC PANEL WITH GFR
Anion gap: 9 (ref 5–15)
BUN: 19 mg/dL (ref 8–23)
CO2: 19 mmol/L — ABNORMAL LOW (ref 22–32)
Calcium: 9.2 mg/dL (ref 8.9–10.3)
Chloride: 109 mmol/L (ref 98–111)
Creatinine, Ser: 0.67 mg/dL (ref 0.44–1.00)
GFR, Estimated: >60 mL/min (ref >60)
Glucose, Bld: 183 mg/dL — ABNORMAL HIGH (ref 70–99)
Potassium: 4 mmol/L (ref 3.5–5.1)
Sodium: 137 mmol/L (ref 135–145)

## 2023-09-18 LAB — GLUCOSE, CAPILLARY
Glucose-Capillary: 187 mg/dL — ABNORMAL HIGH (ref 70–99)
Glucose-Capillary: 220 mg/dL — ABNORMAL HIGH (ref 70–99)
Glucose-Capillary: 105 mg/dL — ABNORMAL HIGH (ref 70–99)
Glucose-Capillary: 313 mg/dL — ABNORMAL HIGH (ref 70–99)

## 2023-09-18 MED ORDER — INSULIN GLARGINE-YFGN 100 UNIT/ML ~~LOC~~ SOLN
30.0000 [IU] | Freq: Two times a day (BID) | SUBCUTANEOUS | Status: DC
  Administered 2023-09-18 – 2023-09-19 (×3): 30 [IU] via SUBCUTANEOUS
  Filled 2023-09-18 (×4): qty 0.3

## 2023-09-18 MED ORDER — OXCARBAZEPINE 300 MG PO TABS
300.0000 mg | ORAL_TABLET | Freq: Two times a day (BID) | ORAL | Status: DC
  Administered 2023-09-18 – 2023-09-29 (×22): 300 mg via ORAL
  Filled 2023-09-18 (×23): qty 1

## 2023-09-18 NOTE — Progress Notes (Signed)
 Progress Note   Patient: Rita Sullivan OZH:[id number] DOB: 1955-11-13 DOA: 09/14/2023     4 DOS: the patient was seen and examined on 09/18/2023     Brief hospital course: HPI: Rita Sullivan is a 68 y.o. female with past medical history of IIDM presented with schizoaffective disorder.  Patient has history of insulin  independent diabetes, she confirms insulin  she takes glimepiride, metformin  and Januvia and her sugar has been fairly controlled.  During this hospitalization it was noticed patient blood sugar in the 308 sliding scale and Lantus  10 unit was added.  However patient has been refusing any insulin  injection this morning and also rejected morning dose of glimepiride.  7 2 totaled patient and stressed importance of compliance with diabetic medication regimen to avoid DKA.  Currently patient has no abdominal pain no nauseous vomiting or diarrhea and she is eating her breakfast as usual.  Blood work showed bicarb 14, mild elevation    Assessment and Plan:   Diabetes mellitus type 2 with hyperglycemia -Secondary to noncompliant with diabetic medications Lantus  has been increased to 30 units twice daily Continue sliding scale insulin  Glucose level improving Continue to monitor glucose closely No overt DKA and no need to transfer out of psych unit Monitor glucose level closely     Schizoaffective disorder -As per primary team.     TR[facility] will continue to follow the patient.     Subjective:  Seen and examined this afternoon Denies nausea vomiting abdominal pain or chest pain Glucose level improving   Physical Exam:   Eyes: PERRL, lids and conjunctivae normal ENMT: Mucous membranes are moist. Posterior pharynx clear of any exudate or lesions.Normal dentition.  Neck: normal, supple, no masses, no thyromegaly Respiratory: clear to auscultation bilaterally, no wheezing, no crackles. Normal respiratory effort. No accessory muscle use.  Cardiovascular: Regular rate and rhythm,  no murmurs / rubs / gallops. No extremity edema. 2+ pedal pulses. No carotid bruits.  Abdomen: no tenderness, no masses palpated. No hepatosplenomegaly. Bowel sounds positive.  Musculoskeletal: no clubbing / cyanosis. No joint deformity upper and lower extremities. Good ROM, no contractures. Normal muscle tone.  Skin: no rashes, lesions, ulcers. No induration Neurologic: CN 2-12 grossly intact. Sensation intact, DTR normal.  Muscle strength 5/5 on both sides     Family Communication: None at bedside Primary team communication: Psychiatry team   Thank you very much for involving us  in the care of your patient.   Data Reviewed:      Latest Ref Rng & Units 09/12/2023    8:04 PM 02/26/2022   10:39 AM 10/18/2021    2:40 PM  CBC  WBC 4.0 - 10.5 K/uL 13.8  9.2  9.0   Hemoglobin 12.0 - 15.0 g/dL 62.9  52.8  41.3   Hematocrit 36.0 - 46.0 % 43.0  38.6  43.6   Platelets 150 - 400 K/uL 385  332  367        Latest Ref Rng & Units 09/18/2023    7:20 AM 09/17/2023    7:18 AM 09/16/2023    7:01 AM  BMP  Glucose 70 - 99 mg/dL 244  010  272   BUN 8 - 23 mg/dL 19  19  29    Creatinine 0.44 - 1.00 mg/dL 5.36  6.44  0.34   Sodium 135 - 145 mmol/L 137  135  139   Potassium 3.5 - 5.1 mmol/L 4.0  3.8  3.8   Chloride 98 - 111 mmol/L 109  108  109   CO2 22 - 32 mmol/L 19  19  20    Calcium  8.9 - 10.3 mg/dL 9.2  9.0  9.5     Have reviewed patient's fingerstick glucose levels  Vitals:   09/16/23 1939 09/17/23 0732 09/17/23 1935 09/18/23 0721  BP: 125/86 104/79 138/87 126/79  Pulse: (!) 112 99 (!) 111 92  Resp: 18 18 16 18   Temp: (!) 97.3 F (36.3 C) (!) 97.5 F (36.4 C) 98.6 F (37 C) 98.2 F (36.8 C)  TempSrc:      SpO2: 99% 99% 99% 98%  Weight:      Height:         Author: Ezzard Holms, MD 09/18/2023 3:26 PM  For on call review [email or web address].

## 2023-09-18 NOTE — Progress Notes (Signed)
 [facility] MD Progress Note  09/18/2023 1:04 PM Rita Sullivan  MRN:  [id number] Patient is a 68 year old female with history of schizoaffective disorder, bipolar type presented with command auditory hallucinations telling her to kill self. Patient currently admitted to geropsych unit for further stabilization   Subjective:  Chart reviewed, case discussed in multidisciplinary meeting, patient seen during rounds.  Patient is noted to be pacing in the hallway.  She is noted to be very irritable today morning.  She is displaying significant thought blocking.  When asked about her sleep she was not able to answer when provider asked other questions she got very irritated and said she needs to be left alone right now and walked away from the provider.  Per nursing report today morning she walked to the end of the hallway and sat on the floor demanding to be allowed into the Courtyard where it is not time.  Nurses tried to convince her that it is raining outside but patient insisted and sat on the floor.  No physical aggression or events occurred.  Eventually patient was able to be redirected and got back to her room.   Sleep: Fair  Appetite:  Fair  Past Psychiatric History: see h&P Family History:  Family History  Problem Relation Age of Onset   Diabetes Father    Diabetes Sister    Social History:  Social History   Substance and Sexual Activity  Alcohol Use Yes   Alcohol/week: 2.0 standard drinks of alcohol   Types: 2 Glasses of wine per week   Comment: Occ     Social History   Substance and Sexual Activity  Drug Use No    Social History   Socioeconomic History   Marital status: Married    Spouse name: Not on file   Number of children: Not on file   Years of education: Not on file   Highest education level: Not on file  Occupational History   Not on file  Tobacco Use   Smoking status: Never   Smokeless tobacco: Never  Vaping Use   Vaping status: Not on file  Substance and  Sexual Activity   Alcohol use: Yes    Alcohol/week: 2.0 standard drinks of alcohol    Types: 2 Glasses of wine per week    Comment: Occ   Drug use: No   Sexual activity: Never  Other Topics Concern   Not on file  Social History Narrative   Not on file   Social Drivers of Health   Financial Resource Strain: Low Risk  (02/23/2023)   Received from [facility]   Overall Financial Resource Strain (CARDIA)    Difficulty of Paying Living Expenses: Not very hard  Food Insecurity: No Food Insecurity (09/14/2023)   Hunger Vital Sign    Worried About Running Out of Food in the Last Year: Never true    Ran Out of Food in the Last Year: Never true  Transportation Needs: No Transportation Needs (09/14/2023)   PRAPARE - Administrator, Civil Service (Medical): No    Lack of Transportation (Non-Medical): No  Physical Activity: Not on file  Stress: Not on file  Social Connections: Socially Isolated (09/14/2023)   Social Connection and Isolation Panel [NHANES]    Frequency of Communication with Friends and Family: Never    Frequency of Social Gatherings with Friends and Family: Never    Attends Religious Services: Never    Database administrator or Organizations: No  Attends Banker Meetings: Never    Marital Status: Married   Past Medical History:  Past Medical History:  Diagnosis Date   Bipolar disorder (HCC)    Chronic kidney disease    Diabetes mellitus without complication (HCC)    Hypercholesteremia    Manic behavior (HCC)     Past Surgical History:  Procedure Laterality Date   COLONOSCOPY WITH PROPOFOL  N/A 03/11/2015   Procedure: COLONOSCOPY WITH PROPOFOL ;  Surgeon: Stephens Eis, MD;  Location: [facility] ENDOSCOPY;  Service: Gastroenterology;  Laterality: N/A;    Current Medications: Current Facility-Administered Medications  Medication Dose Route Frequency Provider Last Rate Last Admin   acetaminophen  (TYLENOL ) tablet 650 mg  650 mg Oral  Q6H PRN Onuoha, Chinwendu V, NP       alum & mag hydroxide-simeth (MAALOX/MYLANTA) 200-200-20 MG/5ML suspension 15 mL  15 mL Oral Q6H PRN Onuoha, Chinwendu V, NP       ARIPiprazole  (ABILIFY ) tablet 10 mg  10 mg Oral BID Shavona Gunderman, MD   10 mg at 09/18/23 0934   glipiZIDE  (GLUCOTROL ) tablet 5 mg  5 mg Oral QAC breakfast Onuoha, Chinwendu V, NP   5 mg at 09/18/23 0935   insulin  aspart (novoLOG ) injection 0-15 Units  0-15 Units Subcutaneous TID WC Onuoha, Chinwendu V, NP   5 Units at 09/18/23 1245   insulin  glargine-yfgn (SEMGLEE ) injection 30 Units  30 Units Subcutaneous BID Ezzard Holms, MD   30 Units at 09/18/23 0935   linagliptin  (TRADJENTA ) tablet 5 mg  5 mg Oral Daily Antoniette Batty T, MD   5 mg at 09/18/23 0933   magnesium  hydroxide (MILK OF MAGNESIA) suspension 15 mL  15 mL Oral Daily PRN Onuoha, Chinwendu V, NP       metFORMIN  (GLUCOPHAGE ) tablet 1,000 mg  1,000 mg Oral BID WC Antoniette Batty T, MD   1,000 mg at 09/18/23 9562   OLANZapine  (ZYPREXA ) injection 5 mg  5 mg Intramuscular TID PRN Onuoha, Chinwendu V, NP       OLANZapine  zydis (ZYPREXA ) disintegrating tablet 5 mg  5 mg Oral TID PRN Onuoha, Chinwendu V, NP   5 mg at 09/14/23 2045   Oxcarbazepine  (TRILEPTAL ) tablet 300 mg  300 mg Oral BID Aisa Schoeppner, MD       traZODone  (DESYREL ) tablet 100 mg  100 mg Oral QHS Nezzie Manera, MD   100 mg at 09/17/23 2115    Lab Results:  Results for orders placed or performed during the hospital encounter of 09/14/23 (from the past 48 hours)  Glucose, capillary     Status: Abnormal   Collection Time: 09/16/23  4:50 PM  Result Value Ref Range   Glucose-Capillary 171 (H) 70 - 99 mg/dL    Comment: Glucose reference range applies only to samples taken after fasting for at least 8 hours.  Glucose, capillary     Status: Abnormal   Collection Time: 09/16/23  7:26 PM  Result Value Ref Range   Glucose-Capillary 285 (H) 70 - 99 mg/dL    Comment: Glucose reference range applies only to samples taken  after fasting for at least 8 hours.  Basic metabolic panel     Status: Abnormal   Collection Time: 09/17/23  7:18 AM  Result Value Ref Range   Sodium 135 135 - 145 mmol/L   Potassium 3.8 3.5 - 5.1 mmol/L   Chloride 108 98 - 111 mmol/L   CO2 19 (L) 22 - 32 mmol/L   Glucose, Bld 319 (H) 70 -  99 mg/dL    Comment: Glucose reference range applies only to samples taken after fasting for at least 8 hours.   BUN 19 8 - 23 mg/dL   Creatinine, Ser 1.61 0.44 - 1.00 mg/dL   Calcium  9.0 8.9 - 10.3 mg/dL   GFR, Estimated >09 >60 mL/min    Comment: (NOTE) Calculated using the CKD-EPI Creatinine Equation (2021)    Anion gap 8 5 - 15    Comment: Performed at [facility], [address] [place], [place] [address]  Glucose, capillary     Status: Abnormal   Collection Time: 09/17/23  7:26 AM  Result Value Ref Range   Glucose-Capillary 313 (H) 70 - 99 mg/dL    Comment: Glucose reference range applies only to samples taken after fasting for at least 8 hours.  Glucose, capillary     Status: Abnormal   Collection Time: 09/17/23 11:27 AM  Result Value Ref Range   Glucose-Capillary 191 (H) 70 - 99 mg/dL    Comment: Glucose reference range applies only to samples taken after fasting for at least 8 hours.  Glucose, capillary     Status: Abnormal   Collection Time: 09/17/23  4:20 PM  Result Value Ref Range   Glucose-Capillary 164 (H) 70 - 99 mg/dL    Comment: Glucose reference range applies only to samples taken after fasting for at least 8 hours.  Glucose, capillary     Status: Abnormal   Collection Time: 09/17/23  7:46 PM  Result Value Ref Range   Glucose-Capillary 274 (H) 70 - 99 mg/dL    Comment: Glucose reference range applies only to samples taken after fasting for at least 8 hours.  Basic metabolic panel     Status: Abnormal   Collection Time: 09/18/23  7:20 AM  Result Value Ref Range   Sodium 137 135 - 145 mmol/L   Potassium 4.0 3.5 - 5.1 mmol/L   Chloride 109 98 - 111 mmol/L    CO2 19 (L) 22 - 32 mmol/L   Glucose, Bld 183 (H) 70 - 99 mg/dL    Comment: Glucose reference range applies only to samples taken after fasting for at least 8 hours.   BUN 19 8 - 23 mg/dL   Creatinine, Ser 8.11 0.44 - 1.00 mg/dL   Calcium  9.2 8.9 - 10.3 mg/dL   GFR, Estimated >91 >47 mL/min    Comment: (NOTE) Calculated using the CKD-EPI Creatinine Equation (2021)    Anion gap 9 5 - 15    Comment: Performed at [facility], [address] [place], [place] [address]  Glucose, capillary     Status: Abnormal   Collection Time: 09/18/23  7:51 AM  Result Value Ref Range   Glucose-Capillary 187 (H) 70 - 99 mg/dL    Comment: Glucose reference range applies only to samples taken after fasting for at least 8 hours.  Glucose, capillary     Status: Abnormal   Collection Time: 09/18/23 11:53 AM  Result Value Ref Range   Glucose-Capillary 220 (H) 70 - 99 mg/dL    Comment: Glucose reference range applies only to samples taken after fasting for at least 8 hours.    Blood Alcohol level:  Lab Results  Component Value Date   [facility] <10 09/12/2023   ETH <10 02/26/2022    Metabolic Disorder Labs: Lab Results  Component Value Date   HGBA1C 7.2 (H) 09/12/2023   MPG 159.94 09/12/2023   MPG 191.51 03/06/2022   Lab Results  Component  Value Date   PROLACTIN 23.3 02/20/2017   PROLACTIN 3.6 (L) 07/31/2016   Lab Results  Component Value Date   CHOL 194 03/06/2022   TRIG 113 03/06/2022   HDL 63 03/06/2022   CHOLHDL 3.1 03/06/2022   VLDL 23 03/06/2022   LDLCALC 108 (H) 03/06/2022   LDLCALC 131 (H) 02/20/2017    Physical Findings: AIMS:  , ,  ,  ,    CIWA:    COWS:      Psychiatric Specialty Exam:  Presentation  General Appearance:  Bizarre; Casual  Eye Contact: Minimal  Speech: Blocked  Speech Volume: Decreased    Mood and Affect  Mood: Irritable  Affect: Labile   Thought Process  Thought Processes: Disorganized  Descriptions of  Associations:Loose  Orientation:Partial  Thought Content:Illogical; Paranoid Ideation; Delusions  Hallucinations:Hallucinations: Auditory  Ideas of Reference:Delusions; Paranoia  Suicidal Thoughts:Suicidal Thoughts: -- (refused to answer)  Homicidal Thoughts:Homicidal Thoughts: -- (refused ot answer)   Sensorium  Memory: -- (unable to assess)  Judgment: Impaired  Insight: Shallow   Executive Functions  Concentration: Poor  Attention Span: Poor  Recall: Poor  Fund of Knowledge: Poor  Language: Poor   Psychomotor Activity  Psychomotor Activity: Psychomotor Activity: Normal  Musculoskeletal: Strength & Muscle Tone: within normal limits Gait & Station: normal Assets  Assets: Resilience; Social Support    Physical Exam: Physical Exam Vitals and nursing note reviewed.    Review of Systems  Unable to perform ROS: Psychiatric disorder   Blood pressure 126/79, pulse 92, temperature 98.2 F (36.8 C), resp. rate 18, height 5\' 2"  (1.575 m), weight 71.4 kg, SpO2 98%. Body mass index is 28.81 kg/m.  Diagnosis: Principal Problem:   Schizoaffective disorder, bipolar type (HCC) Active Problems:   Auditory hallucinations  Clinical Decision Making: Patient with history of schizoaffective disorder currently noncompliant with medication, displaying significant amount of disorganized behavior, leaving the house and wandering/driving on the streets aimlessly, endorsing command type auditory hallucinations telling her to kill herself, lacks insight and refusing treatment.  Patient is currently admitted to geropsych unit on IVC for further stabilization.   Treatment Plan Summary:   Safety and Monitoring:             -- Voluntary admission to inpatient psychiatric unit for safety, stabilization and treatment             -- Daily contact with patient to assess and evaluate symptoms and progress in treatment             -- Patient's case to be discussed in  multi-disciplinary team meeting             -- Observation Level: q15 minute checks             -- Vital signs:  q12 hours             -- Precautions: suicide, elopement, and assault   2. Psychiatric Diagnoses and Treatment:             Abilify  10 mg BID  Abilify  maintaina 400 mg IM/ on 4 /25/25 Increase Trileptal  300mg  bID for mood stabilization If patient refuses oral medication will initiate enforced medication consult     -- The risks/benefits/side-effects/alternatives to this medication were discussed in detail with the patient and time was given for questions. The patient consents to medication trial.                -- Metabolic profile and EKG monitoring obtained while on an atypical  antipsychotic (BMI: Lipid Panel: HbgA1c: QTc:)              -- Encouraged patient to participate in unit milieu and in scheduled group therapies                            3. Medical Issues Being Addressed:  Hospitalist consult is made given her blood sugars more than 550 and her insulin  is adjusted   4. Discharge Planning:              -- Social work and case management to assist with discharge planning and identification of hospital follow-up needs prior to discharge             -- Estimated LOS: 4-5 days             -- Discharge Concerns: Need to establish a safety plan; Medication compliance and effectiveness             -- Discharge Goals: Return home with outpatient referrals follow ups   Physician Treatment Plan for Primary Diagnosis: Schizoaffective disorder, bipolar type (HCC) Long Term Goal(s): Improvement in symptoms so as ready for discharge   Short Term Goals: Ability to identify changes in lifestyle to reduce recurrence of condition will improve, Ability to verbalize feelings will improve, Ability to disclose and discuss suicidal ideas, Ability to demonstrate self-control will improve, and Ability to identify and develop effective coping behaviors will improve   Physician Treatment  Plan for Secondary Diagnosis: Principal Problem:   Schizoaffective disorder, bipolar type (HCC) Active Problems:   Auditory hallucinations   Long Term Goal(s): Improvement in symptoms so as ready for discharge   Short Term Goals: Ability to identify changes in lifestyle to reduce recurrence of condition will improve, Ability to verbalize feelings will improve, Ability to disclose and discuss suicidal ideas, Ability to demonstrate self-control will improve, and Ability to identify and develop effective coping behaviors will improve  Aurelia Blotter, MD 09/18/2023, 1:04 PM

## 2023-09-18 NOTE — Group Note (Signed)
 Date:  09/18/2023 Time:  3:29 PM  Group Topic/Focus:  Orientation:   The focus of this group is to educate the patient on the purpose and policies of crisis stabilization and provide a format to answer questions about their admission.  The group details unit policies and expectations of patients while admitted.    Participation Level:  Active  Participation Quality:  Appropriate and Attentive  Affect:  Appropriate  Cognitive:  Oriented  Insight: Appropriate and Good  Engagement in Group:  Engaged  Modes of Intervention:  Orientation  Additional Comments:     Fleet Huh 09/18/2023, 3:29 PM

## 2023-09-18 NOTE — Progress Notes (Signed)
   09/18/23 0721  Psych Admission Type (Psych Patients Only)  Admission Status Involuntary  Psychosocial Assessment  Patient Complaints Confusion  Eye Contact Brief  Facial Expression Anxious  Affect Anxious  Speech Soft  Interaction Isolative  Motor Activity Slow  Appearance/Hygiene In scrubs  Behavior Characteristics Impulsive;Intrusive;Irritable  Mood Anxious;Irritable  Thought Process  Coherency WDL  Content WDL  Delusions None reported or observed  Perception WDL  Hallucination None reported or observed  Judgment Impaired  Confusion Mild  Danger to Self  Current suicidal ideation? Denies  Danger to Others  Danger to Others None reported or observed

## 2023-09-18 NOTE — Plan of Care (Signed)
  Problem: Education: Goal: Ability to describe self-care measures that may prevent or decrease complications (Diabetes Survival Skills Education) will improve Outcome: Progressing Goal: Individualized Educational Video(s) Outcome: Progressing   Problem: Coping: Goal: Ability to adjust to condition or change in health will improve Outcome: Progressing   Problem: Fluid Volume: Goal: Ability to maintain a balanced intake and output will improve Outcome: Progressing   Problem: Health Behavior/Discharge Planning: Goal: Ability to identify and utilize available resources and services will improve Outcome: Progressing Goal: Ability to manage health-related needs will improve Outcome: Progressing   Problem: Metabolic: Goal: Ability to maintain appropriate glucose levels will improve Outcome: Progressing   Problem: Nutritional: Goal: Maintenance of adequate nutrition will improve Outcome: Progressing Goal: Progress toward achieving an optimal weight will improve Outcome: Progressing   Problem: Skin Integrity: Goal: Risk for impaired skin integrity will decrease Outcome: Progressing   Problem: Tissue Perfusion: Goal: Adequacy of tissue perfusion will improve Outcome: Progressing   Problem: Education: Goal: Ability to make informed decisions regarding treatment will improve Outcome: Progressing   Problem: Coping: Goal: Coping ability will improve Outcome: Progressing   Problem: Health Behavior/Discharge Planning: Goal: Identification of resources available to assist in meeting health care needs will improve Outcome: Progressing   Problem: Medication: Goal: Compliance with prescribed medication regimen will improve Outcome: Progressing   Problem: Self-Concept: Goal: Ability to disclose and discuss suicidal ideas will improve Outcome: Progressing Goal: Will verbalize positive feelings about self Outcome: Progressing Note: Patient is not on track and improving. Patient  will work on increased adherence    Problem: Activity: Goal: Will verbalize the importance of balancing activity with adequate rest periods Outcome: Progressing   Problem: Education: Goal: Will be free of psychotic symptoms Outcome: Progressing Goal: Knowledge of the prescribed therapeutic regimen will improve Outcome: Progressing   Problem: Coping: Goal: Coping ability will improve Outcome: Progressing   Problem: Nutritional: Goal: Ability to achieve adequate nutritional intake will improve Outcome: Progressing   Problem: Role Relationship: Goal: Ability to communicate needs accurately will improve Outcome: Progressing   Problem: Self-Care: Goal: Ability to participate in self-care as condition permits will improve Outcome: Progressing   Problem: Self-Concept: Goal: Will verbalize positive feelings about self Outcome: Progressing

## 2023-09-19 LAB — BASIC METABOLIC PANEL WITH GFR
Anion gap: 6 (ref 5–15)
BUN: 18 mg/dL (ref 8–23)
CO2: 22 mmol/L (ref 22–32)
Calcium: 9.1 mg/dL (ref 8.9–10.3)
Chloride: 111 mmol/L (ref 98–111)
Creatinine, Ser: 0.74 mg/dL (ref 0.44–1.00)
GFR, Estimated: >60 mL/min (ref >60)
Glucose, Bld: 123 mg/dL — ABNORMAL HIGH (ref 70–99)
Potassium: 4.1 mmol/L (ref 3.5–5.1)
Sodium: 139 mmol/L (ref 135–145)

## 2023-09-19 LAB — GLUCOSE, CAPILLARY
Glucose-Capillary: 116 mg/dL — ABNORMAL HIGH (ref 70–99)
Glucose-Capillary: 138 mg/dL — ABNORMAL HIGH (ref 70–99)
Glucose-Capillary: 108 mg/dL — ABNORMAL HIGH (ref 70–99)
Glucose-Capillary: 183 mg/dL — ABNORMAL HIGH (ref 70–99)

## 2023-09-19 MED ORDER — INSULIN GLARGINE-YFGN 100 UNIT/ML ~~LOC~~ SOLN
25.0000 [IU] | Freq: Two times a day (BID) | SUBCUTANEOUS | Status: DC
  Administered 2023-09-19 – 2023-09-27 (×16): 25 [IU] via SUBCUTANEOUS
  Filled 2023-09-19 (×17): qty 0.25

## 2023-09-19 NOTE — Progress Notes (Signed)
 [facility] MD Progress Note  09/19/2023 10:19 PM Becki Cornely  MRN:  [id number] Patient is a 68 year old female with history of schizoaffective disorder, bipolar type presented with command auditory hallucinations telling her to kill self. Patient currently admitted to geropsych unit for further stabilization   Subjective:  Chart reviewed, case discussed in multidisciplinary meeting, patient seen during rounds.  Patient is noted to be sleeping in her bed.  She responded to verbal commands.  She is able to tell month as April and the year is 2025.  She has some thought blocking but is.  She does endorse at times having conversations in her mind but denies the voices being command type and states that they just ask her questions.  She denies visual hallucinations//HI/intent/plan.  She is tolerating the LAI well with no reported side effects. Sleep: Fair  Appetite:  Fair  Past Psychiatric History: see h&P Family History:  Family History  Problem Relation Age of Onset   Diabetes Father    Diabetes Sister    Social History:  Social History   Substance and Sexual Activity  Alcohol Use Yes   Alcohol/week: 2.0 standard drinks of alcohol   Types: 2 Glasses of wine per week   Comment: Occ     Social History   Substance and Sexual Activity  Drug Use No    Social History   Socioeconomic History   Marital status: Married    Spouse name: Not on file   Number of children: Not on file   Years of education: Not on file   Highest education level: Not on file  Occupational History   Not on file  Tobacco Use   Smoking status: Never   Smokeless tobacco: Never  Vaping Use   Vaping status: Not on file  Substance and Sexual Activity   Alcohol use: Yes    Alcohol/week: 2.0 standard drinks of alcohol    Types: 2 Glasses of wine per week    Comment: Occ   Drug use: No   Sexual activity: Never  Other Topics Concern   Not on file  Social History Narrative   Not on file   Social Drivers of  Health   Financial Resource Strain: Low Risk  (02/23/2023)   Received from [facility]   Overall Financial Resource Strain (CARDIA)    Difficulty of Paying Living Expenses: Not very hard  Food Insecurity: No Food Insecurity (09/14/2023)   Hunger Vital Sign    Worried About Running Out of Food in the Last Year: Never true    Ran Out of Food in the Last Year: Never true  Transportation Needs: No Transportation Needs (09/14/2023)   PRAPARE - Administrator, Civil Service (Medical): No    Lack of Transportation (Non-Medical): No  Physical Activity: Not on file  Stress: Not on file  Social Connections: Socially Isolated (09/14/2023)   Social Connection and Isolation Panel [NHANES]    Frequency of Communication with Friends and Family: Never    Frequency of Social Gatherings with Friends and Family: Never    Attends Religious Services: Never    Database administrator or Organizations: No    Attends Engineer, structural: Never    Marital Status: Married   Past Medical History:  Past Medical History:  Diagnosis Date   Bipolar disorder (HCC)    Chronic kidney disease    Diabetes mellitus without complication (HCC)    Hypercholesteremia    Manic behavior (HCC)  Past Surgical History:  Procedure Laterality Date   COLONOSCOPY WITH PROPOFOL  N/A 03/11/2015   Procedure: COLONOSCOPY WITH PROPOFOL ;  Surgeon: Stephens Eis, MD;  Location: [facility] ENDOSCOPY;  Service: Gastroenterology;  Laterality: N/A;    Current Medications: Current Facility-Administered Medications  Medication Dose Route Frequency Provider Last Rate Last Admin   acetaminophen  (TYLENOL ) tablet 650 mg  650 mg Oral Q6H PRN Onuoha, Chinwendu V, NP       alum & mag hydroxide-simeth (MAALOX/MYLANTA) 200-200-20 MG/5ML suspension 15 mL  15 mL Oral Q6H PRN Onuoha, Chinwendu V, NP       ARIPiprazole  (ABILIFY ) tablet 10 mg  10 mg Oral BID Lolita Faulds, MD   10 mg at 09/19/23 2204   glipiZIDE   (GLUCOTROL ) tablet 5 mg  5 mg Oral QAC breakfast Onuoha, Chinwendu V, NP   5 mg at 09/19/23 0934   insulin  aspart (novoLOG ) injection 0-15 Units  0-15 Units Subcutaneous TID WC Onuoha, Chinwendu V, NP   2 Units at 09/19/23 1234   insulin  glargine-yfgn (SEMGLEE ) injection 25 Units  25 Units Subcutaneous BID Djan, Prince T, MD   25 Units at 09/19/23 2205   linagliptin  (TRADJENTA ) tablet 5 mg  5 mg Oral Daily Antoniette Batty T, MD   5 mg at 09/19/23 0935   magnesium  hydroxide (MILK OF MAGNESIA) suspension 15 mL  15 mL Oral Daily PRN Onuoha, Chinwendu V, NP       metFORMIN  (GLUCOPHAGE ) tablet 1,000 mg  1,000 mg Oral BID WC Antoniette Batty T, MD   1,000 mg at 09/19/23 1652   OLANZapine  (ZYPREXA ) injection 5 mg  5 mg Intramuscular TID PRN Onuoha, Chinwendu V, NP       OLANZapine  zydis (ZYPREXA ) disintegrating tablet 5 mg  5 mg Oral TID PRN Onuoha, Chinwendu V, NP   5 mg at 09/19/23 0141   Oxcarbazepine  (TRILEPTAL ) tablet 300 mg  300 mg Oral BID Maura Braaten, MD   300 mg at 09/19/23 2204   traZODone  (DESYREL ) tablet 100 mg  100 mg Oral QHS Janijah Symons, MD   100 mg at 09/19/23 2204    Lab Results:  Results for orders placed or performed during the hospital encounter of 09/14/23 (from the past 48 hours)  Basic metabolic panel     Status: Abnormal   Collection Time: 09/18/23  7:20 AM  Result Value Ref Range   Sodium 137 135 - 145 mmol/L   Potassium 4.0 3.5 - 5.1 mmol/L   Chloride 109 98 - 111 mmol/L   CO2 19 (L) 22 - 32 mmol/L   Glucose, Bld 183 (H) 70 - 99 mg/dL    Comment: Glucose reference range applies only to samples taken after fasting for at least 8 hours.   BUN 19 8 - 23 mg/dL   Creatinine, Ser 1.61 0.44 - 1.00 mg/dL   Calcium  9.2 8.9 - 10.3 mg/dL   GFR, Estimated >09 >60 mL/min    Comment: (NOTE) Calculated using the CKD-EPI Creatinine Equation (2021)    Anion gap 9 5 - 15    Comment: Performed at [facility], [address] [place], [place] [address]  Glucose, capillary      Status: Abnormal   Collection Time: 09/18/23  7:51 AM  Result Value Ref Range   Glucose-Capillary 187 (H) 70 - 99 mg/dL    Comment: Glucose reference range applies only to samples taken after fasting for at least 8 hours.  Glucose, capillary     Status: Abnormal   Collection Time:  09/18/23 11:53 AM  Result Value Ref Range   Glucose-Capillary 220 (H) 70 - 99 mg/dL    Comment: Glucose reference range applies only to samples taken after fasting for at least 8 hours.  Glucose, capillary     Status: Abnormal   Collection Time: 09/18/23  4:25 PM  Result Value Ref Range   Glucose-Capillary 105 (H) 70 - 99 mg/dL    Comment: Glucose reference range applies only to samples taken after fasting for at least 8 hours.  Glucose, capillary     Status: Abnormal   Collection Time: 09/18/23  8:10 PM  Result Value Ref Range   Glucose-Capillary 313 (H) 70 - 99 mg/dL    Comment: Glucose reference range applies only to samples taken after fasting for at least 8 hours.  Basic metabolic panel     Status: Abnormal   Collection Time: 09/19/23  6:11 AM  Result Value Ref Range   Sodium 139 135 - 145 mmol/L   Potassium 4.1 3.5 - 5.1 mmol/L   Chloride 111 98 - 111 mmol/L   CO2 22 22 - 32 mmol/L   Glucose, Bld 123 (H) 70 - 99 mg/dL    Comment: Glucose reference range applies only to samples taken after fasting for at least 8 hours.   BUN 18 8 - 23 mg/dL   Creatinine, Ser 1.61 0.44 - 1.00 mg/dL   Calcium  9.1 8.9 - 10.3 mg/dL   GFR, Estimated >09 >60 mL/min    Comment: (NOTE) Calculated using the CKD-EPI Creatinine Equation (2021)    Anion gap 6 5 - 15    Comment: Performed at [facility], [address] [place], [place] [address]  Glucose, capillary     Status: Abnormal   Collection Time: 09/19/23  7:40 AM  Result Value Ref Range   Glucose-Capillary 116 (H) 70 - 99 mg/dL    Comment: Glucose reference range applies only to samples taken after fasting for at least 8 hours.  Glucose, capillary      Status: Abnormal   Collection Time: 09/19/23 11:49 AM  Result Value Ref Range   Glucose-Capillary 138 (H) 70 - 99 mg/dL    Comment: Glucose reference range applies only to samples taken after fasting for at least 8 hours.  Glucose, capillary     Status: Abnormal   Collection Time: 09/19/23  4:31 PM  Result Value Ref Range   Glucose-Capillary 108 (H) 70 - 99 mg/dL    Comment: Glucose reference range applies only to samples taken after fasting for at least 8 hours.  Glucose, capillary     Status: Abnormal   Collection Time: 09/19/23  7:50 PM  Result Value Ref Range   Glucose-Capillary 183 (H) 70 - 99 mg/dL    Comment: Glucose reference range applies only to samples taken after fasting for at least 8 hours.    Blood Alcohol level:  Lab Results  Component Value Date   ETH <10 09/12/2023   ETH <10 02/26/2022    Metabolic Disorder Labs: Lab Results  Component Value Date   HGBA1C 7.2 (H) 09/12/2023   MPG 159.94 09/12/2023   MPG 191.51 03/06/2022   Lab Results  Component Value Date   PROLACTIN 23.3 02/20/2017   PROLACTIN 3.6 (L) 07/31/2016   Lab Results  Component Value Date   CHOL 194 03/06/2022   TRIG 113 03/06/2022   HDL 63 03/06/2022   CHOLHDL 3.1 03/06/2022   VLDL 23 03/06/2022   LDLCALC 108 (H) 03/06/2022  LDLCALC 131 (H) 02/20/2017    Physical Findings: AIMS:  , ,  ,  ,    CIWA:    COWS:      Psychiatric Specialty Exam:  Presentation  General Appearance:  Bizarre; Casual  Eye Contact: Minimal  Speech: Blocked  Speech Volume: Decreased    Mood and Affect  Mood: Irritable  Affect: Labile   Thought Process  Thought Processes: Disorganized  Descriptions of Associations:Loose  Orientation:Partial  Thought Content:Illogical; Paranoid Ideation; Delusions  Hallucinations:Hallucinations: Auditory  Ideas of Reference:Delusions; Paranoia  Suicidal Thoughts:Suicidal Thoughts: -- (refused to answer)  Homicidal Thoughts:Homicidal  Thoughts: -- (refused ot answer)   Sensorium  Memory: -- (unable to assess)  Judgment: Impaired  Insight: Shallow   Executive Functions  Concentration: Poor  Attention Span: Poor  Recall: Poor  Fund of Knowledge: Poor  Language: Poor   Psychomotor Activity  Psychomotor Activity: Psychomotor Activity: Normal  Musculoskeletal: Strength & Muscle Tone: within normal limits Gait & Station: normal Assets  Assets: Resilience; Social Support    Physical Exam: Physical Exam Vitals and nursing note reviewed.    Review of Systems  Unable to perform ROS: Psychiatric disorder   Blood pressure 109/89, pulse (!) 116, temperature 98.5 F (36.9 C), resp. rate 17, height 5\' 2"  (1.575 m), weight 71.4 kg, SpO2 99%. Body mass index is 28.81 kg/m.  Diagnosis: Principal Problem:   Schizoaffective disorder, bipolar type (HCC) Active Problems:   Auditory hallucinations  Clinical Decision Making: Patient with history of schizoaffective disorder currently noncompliant with medication, displaying significant amount of disorganized behavior, leaving the house and wandering/driving on the streets aimlessly, endorsing command type auditory hallucinations telling her to kill herself, lacks insight and refusing treatment.  Patient is currently admitted to geropsych unit on IVC for further stabilization.   Treatment Plan Summary:   Safety and Monitoring:             -- Voluntary admission to inpatient psychiatric unit for safety, stabilization and treatment             -- Daily contact with patient to assess and evaluate symptoms and progress in treatment             -- Patient's case to be discussed in multi-disciplinary team meeting             -- Observation Level: q15 minute checks             -- Vital signs:  q12 hours             -- Precautions: suicide, elopement, and assault   2. Psychiatric Diagnoses and Treatment:             Abilify  10 mg BID  Abilify  maintaina 400  mg IM/ on 4 /25/25 Increase Trileptal  300mg  bID for mood stabilization If patient refuses oral medication will initiate enforced medication consult     -- The risks/benefits/side-effects/alternatives to this medication were discussed in detail with the patient and time was given for questions. The patient consents to medication trial.                -- Metabolic profile and EKG monitoring obtained while on an atypical antipsychotic (BMI: Lipid Panel: HbgA1c: QTc:)              -- Encouraged patient to participate in unit milieu and in scheduled group therapies  3. Medical Issues Being Addressed:  Hospitalist consult is made given her blood sugars more than 550 and her insulin  is adjusted   4. Discharge Planning:              -- Social work and case management to assist with discharge planning and identification of hospital follow-up needs prior to discharge             -- Estimated LOS: 4-5 days             -- Discharge Concerns: Need to establish a safety plan; Medication compliance and effectiveness             -- Discharge Goals: Return home with outpatient referrals follow ups   Physician Treatment Plan for Primary Diagnosis: Schizoaffective disorder, bipolar type (HCC) Long Term Goal(s): Improvement in symptoms so as ready for discharge   Short Term Goals: Ability to identify changes in lifestyle to reduce recurrence of condition will improve, Ability to verbalize feelings will improve, Ability to disclose and discuss suicidal ideas, Ability to demonstrate self-control will improve, and Ability to identify and develop effective coping behaviors will improve   Physician Treatment Plan for Secondary Diagnosis: Principal Problem:   Schizoaffective disorder, bipolar type (HCC) Active Problems:   Auditory hallucinations   Long Term Goal(s): Improvement in symptoms so as ready for discharge   Short Term Goals: Ability to identify changes in lifestyle to reduce  recurrence of condition will improve, Ability to verbalize feelings will improve, Ability to disclose and discuss suicidal ideas, Ability to demonstrate self-control will improve, and Ability to identify and develop effective coping behaviors will improve  Aurelia Blotter, MD 09/19/2023, 10:19 PM

## 2023-09-19 NOTE — Plan of Care (Signed)
  Problem: Health Behavior/Discharge Planning: Goal: Ability to manage health-related needs will improve Outcome: Progressing   Problem: Metabolic: Goal: Ability to maintain appropriate glucose levels will improve Outcome: Progressing   Problem: Nutritional: Goal: Maintenance of adequate nutrition will improve Outcome: Progressing

## 2023-09-19 NOTE — Progress Notes (Signed)
 Patient mild agitated this morning screaming. She required redirection to her room was wondering the hall way. Patient is very anxious her hands shaking.  Prn  Zyprexa  given. Support provided and redirection. Q 15 minutes safety checks ongoing.

## 2023-09-19 NOTE — Progress Notes (Signed)
 Progress Note   Patient: Rita Sullivan ZOX:[id number] DOB: 1956-02-14 DOA: 09/14/2023     5 DOS: the patient was seen and examined on 09/19/2023      Brief hospital course: HPI: Rita Sullivan is a 68 y.o. female with past medical history of IIDM presented with schizoaffective disorder.  Patient has history of insulin  independent diabetes, she confirms insulin  she takes glimepiride, metformin  and Januvia and her sugar has been fairly controlled.  During this hospitalization it was noticed patient blood sugar in the 308 sliding scale and Lantus  10 unit was added.  However patient has been refusing any insulin  injection this morning and also rejected morning dose of glimepiride.  7 2 totaled patient and stressed importance of compliance with diabetic medication regimen to avoid DKA.  Currently patient has no abdominal pain no nauseous vomiting or diarrhea and she is eating her breakfast as usual.  Blood work showed bicarb 14, mild elevation    Assessment and Plan:   Diabetes mellitus type 2 with hyperglycemia -Secondary to noncompliant with diabetic medications A1c 7.2 Lantus  reduced to 25 units twice daily Continue sliding scale insulin  Glucose level much more controlled Continue to monitor glucose closely No overt DKA and no need to transfer out of psych unit Monitor glucose level closely At discharge, patient can be discharged home on metformin  500 mg twice daily, glargine 10 units at night, glipizide  5mg  daily and follow-up with her PCP.     Schizoaffective disorder Continue management according to psychiatry team     Subjective:  Patient seen and examined at bedside this afternoon Denies any acute overnight events Glucose much more controlled on current insulin  regimen  Physical Exam:   Eyes: PERRL, lids and conjunctivae normal ENMT: Mucous membranes are moist. Posterior pharynx clear of any exudate or lesions.Normal dentition.  Neck: normal, supple, no masses, no  thyromegaly Respiratory: clear to auscultation bilaterally, no wheezing, no crackles. Normal respiratory effort. No accessory muscle use.  Cardiovascular: Regular rate and rhythm, no murmurs / rubs / gallops. No extremity edema. 2+ pedal pulses. No carotid bruits.  Abdomen: no tenderness, no masses palpated. No hepatosplenomegaly. Bowel sounds positive.  Musculoskeletal: no clubbing / cyanosis. No joint deformity upper and lower extremities. Good ROM, no contractures. Normal muscle tone.  Skin: no rashes, lesions, ulcers. No induration Neurologic: CN 2-12 grossly intact. Sensation intact, DTR normal.  Muscle strength 5/5 on both sides     Family Communication: None at bedside Primary team communication: Psychiatry team   Thank you very much for involving us  in the care of your patient.     Since glucose level is much more controlled TRH service will sign off at this time.  Please do not hesitate to reach out to us  if you have further questions.  Data Reviewed:    Latest Ref Rng & Units 09/12/2023    8:04 PM 02/26/2022   10:39 AM 10/18/2021    2:40 PM  CBC  WBC 4.0 - 10.5 K/uL 13.8  9.2  9.0   Hemoglobin 12.0 - 15.0 g/dL 81.1  91.4  78.2   Hematocrit 36.0 - 46.0 % 43.0  38.6  43.6   Platelets 150 - 400 K/uL 385  332  367        Latest Ref Rng & Units 09/19/2023    6:11 AM 09/18/2023    7:20 AM 09/17/2023    7:18 AM  BMP  Glucose 70 - 99 mg/dL 956  213  086   BUN 8 -  23 mg/dL 18  19  19    Creatinine 0.44 - 1.00 mg/dL 4.09  8.11  9.14   Sodium 135 - 145 mmol/L 139  137  135   Potassium 3.5 - 5.1 mmol/L 4.1  4.0  3.8   Chloride 98 - 111 mmol/L 111  109  108   CO2 22 - 32 mmol/L 22  19  19    Calcium  8.9 - 10.3 mg/dL 9.1  9.2  9.0     Vitals:   09/17/23 1935 09/18/23 0721 09/18/23 1953 09/19/23 0720  BP: 138/87 126/79 133/83 (!) 119/92  Pulse: (!) 111 92 (!) 114 97  Resp: 16 18 16 18   Temp: 98.6 F (37 C) 98.2 F (36.8 C) 98.4 F (36.9 C) 97.9 F (36.6 C)  TempSrc:       SpO2: 99% 98% 99% 100%  Weight:      Height:         Author: Ezzard Holms, MD 09/19/2023 6:00 PM  For on call review [email or web address].

## 2023-09-19 NOTE — Progress Notes (Signed)
 Patient is an involuntary admission to [facility] for SAD - Bipolar disorder.  Patient can be labile - seemed more relaxed this afternoon vs this morning. Blood glucose has been more stable today with diabetic coordinator adjusting her long-acting medication today.  Patient denies SI, HI, AVH, anxiety and depression. Took medications including her injections (for diabetes).  Will continue to monitor.

## 2023-09-19 NOTE — Progress Notes (Signed)
   09/19/23 2300  Psych Admission Type (Psych Patients Only)  Admission Status Involuntary  Psychosocial Assessment  Patient Complaints Confusion  Eye Contact Brief  Facial Expression Anxious  Affect Anxious  Speech Soft  Interaction Isolative  Motor Activity Slow  Appearance/Hygiene In scrubs  Behavior Characteristics Appropriate to situation  Mood Labile  Thought Process  Coherency WDL  Content WDL  Delusions None reported or observed  Perception WDL  Hallucination None reported or observed  Judgment Impaired  Confusion Mild  Danger to Self  Current suicidal ideation? Denies (Denies)  Agreement Not to Harm Self Yes  Description of Agreement verbal  Danger to Others  Danger to Others None reported or observed

## 2023-09-19 NOTE — Plan of Care (Signed)
   Problem: Education: Goal: Ability to describe self-care measures that may prevent or decrease complications (Diabetes Survival Skills Education) will improve Outcome: Progressing Goal: Individualized Educational Video(s) Outcome: Progressing   Problem: Coping: Goal: Ability to adjust to condition or change in health will improve Outcome: Progressing

## 2023-09-19 NOTE — Progress Notes (Signed)
   09/19/23 0000  Psych Admission Type (Psych Patients Only)  Admission Status Involuntary  Psychosocial Assessment  Patient Complaints Confusion  Eye Contact Brief  Facial Expression Anxious  Affect Anxious  Speech Soft  Interaction Isolative  Motor Activity Slow  Appearance/Hygiene In scrubs  Behavior Characteristics Impulsive;Irritable;Intrusive  Mood Anxious;Irritable  Thought Process  Coherency WDL  Content WDL  Delusions None reported or observed  Perception WDL  Hallucination None reported or observed  Judgment Impaired  Confusion Mild  Danger to Self  Current suicidal ideation? Denies (Denies)  Agreement Not to Harm Self Yes  Description of Agreement verbal  Danger to Others  Danger to Others None reported or observed

## 2023-09-19 NOTE — Group Note (Signed)
 Date:  09/19/2023 Time:  10:43 PM  Group Topic/Focus:  Wrap-Up Group:   The focus of this group is to help patients review their daily goal of treatment and discuss progress on daily workbooks.    Participation Level:  Did Not Attend   Rita Sullivan 09/19/2023, 10:43 PM

## 2023-09-20 DIAGNOSIS — F250 Schizoaffective disorder, bipolar type: Secondary | ICD-10-CM | POA: Diagnosis not present

## 2023-09-20 LAB — GLUCOSE, CAPILLARY
Glucose-Capillary: 78 mg/dL (ref 70–99)
Glucose-Capillary: 102 mg/dL — ABNORMAL HIGH (ref 70–99)
Glucose-Capillary: 107 mg/dL — ABNORMAL HIGH (ref 70–99)
Glucose-Capillary: 149 mg/dL — ABNORMAL HIGH (ref 70–99)

## 2023-09-20 LAB — BASIC METABOLIC PANEL WITH GFR
Anion gap: 10 (ref 5–15)
BUN: 19 mg/dL (ref 8–23)
CO2: 22 mmol/L (ref 22–32)
Calcium: 9.6 mg/dL (ref 8.9–10.3)
Chloride: 111 mmol/L (ref 98–111)
Creatinine, Ser: 0.74 mg/dL (ref 0.44–1.00)
GFR, Estimated: >60 mL/min (ref >60)
Glucose, Bld: 81 mg/dL (ref 70–99)
Potassium: 3.9 mmol/L (ref 3.5–5.1)
Sodium: 143 mmol/L (ref 135–145)

## 2023-09-20 NOTE — [facility] Counselor (Signed)
 CSW contacted [facility] [phone]) to request information about referral to ACTT servioces.

## 2023-09-20 NOTE — Group Note (Signed)
 Recreation Therapy Group Note   Group Topic:Coping Skills  Group Date: 09/20/2023 Start Time: 1500 End Time: 1600 Facilitators: Deatrice Factor, LRT, CTRS Location: Courtyard  Group Description: Mind Map.  Patient was provided a blank template of a diagram with 32 blank boxes in a tiered system, branching from the center (similar to a bubble chart). LRT directed patients to label the middle of the diagram "Coping Skills". LRT and patients then came up with 8 different coping skills as examples. Pt were directed to record their coping skills in the 2nd tier boxes closest to the center.  Patients would then share their coping skills with the group as LRT wrote them out. LRT gave a handout of 99 different coping skills at the end of group.   Goal Area(s) Addressed: Patients will be able to define "coping skills". Patient will identify new coping skills.  Patient will increase communication.   Affect/Mood: Appropriate   Participation Level: Active   Participation Quality: Independent   Behavior: Calm and Cooperative   Speech/Thought Process: Coherent   Insight: Fair   Judgement: Good   Modes of Intervention: Clarification, Education, Worksheet, and Writing   Patient Response to Interventions:  Attentive and Receptive   Education Outcome:  Acknowledges education   Clinical Observations/Individualized Feedback: [place] was active in their participation of session activities and group discussion. Pt identified "hiking and hugging someone" as a coping skill. Pt interacted well with LRT and peers duration of session.    Plan: Continue to engage patient in RT group sessions 2-3x/week.   Deatrice Factor, LRT, CTRS 09/20/2023 4:59 PM

## 2023-09-20 NOTE — Progress Notes (Signed)
 [facility] MD Progress Note  09/20/2023 4:30 PM Rita Sullivan  MRN:  [id number] Patient is a 68 year old female with history of schizoaffective disorder, bipolar type presented with command auditory hallucinations telling her to kill self. Patient currently admitted to geropsych unit for further stabilization   Subjective:  Chart reviewed, case discussed in multidisciplinary meeting, patient seen during rounds.  Patient is noted to be resting in her room.  She continues to display thought blocking but is improving with medication.  She is taking her medications with a lot of prompting.  She continues to display disorganized behavior and at times responding to stimuli.  She denies SI/HI/plan.  Today she is denying auditory and visual hallucinations.  Patient is encouraged to participate in her ADLs.  Patient talked about wanting to see her husband during visitation hours. Sleep: Fair  Appetite:  Fair  Past Psychiatric History: see h&P Family History:  Family History  Problem Relation Age of Onset   Diabetes Father    Diabetes Sister    Social History:  Social History   Substance and Sexual Activity  Alcohol Use Yes   Alcohol/week: 2.0 standard drinks of alcohol   Types: 2 Glasses of wine per week   Comment: Occ     Social History   Substance and Sexual Activity  Drug Use No    Social History   Socioeconomic History   Marital status: Married    Spouse name: Not on file   Number of children: Not on file   Years of education: Not on file   Highest education level: Not on file  Occupational History   Not on file  Tobacco Use   Smoking status: Never   Smokeless tobacco: Never  Vaping Use   Vaping status: Not on file  Substance and Sexual Activity   Alcohol use: Yes    Alcohol/week: 2.0 standard drinks of alcohol    Types: 2 Glasses of wine per week    Comment: Occ   Drug use: No   Sexual activity: Never  Other Topics Concern   Not on file  Social History Narrative   Not  on file   Social Drivers of Health   Financial Resource Strain: Low Risk  (02/23/2023)   Received from [facility]   Overall Financial Resource Strain (CARDIA)    Difficulty of Paying Living Expenses: Not very hard  Food Insecurity: No Food Insecurity (09/14/2023)   Hunger Vital Sign    Worried About Running Out of Food in the Last Year: Never true    Ran Out of Food in the Last Year: Never true  Transportation Needs: No Transportation Needs (09/14/2023)   PRAPARE - Administrator, Civil Service (Medical): No    Lack of Transportation (Non-Medical): No  Physical Activity: Not on file  Stress: Not on file  Social Connections: Socially Isolated (09/14/2023)   Social Connection and Isolation Panel [NHANES]    Frequency of Communication with Friends and Family: Never    Frequency of Social Gatherings with Friends and Family: Never    Attends Religious Services: Never    Database administrator or Organizations: No    Attends Engineer, structural: Never    Marital Status: Married   Past Medical History:  Past Medical History:  Diagnosis Date   Bipolar disorder (HCC)    Chronic kidney disease    Diabetes mellitus without complication (HCC)    Hypercholesteremia    Manic behavior (HCC)  Past Surgical History:  Procedure Laterality Date   COLONOSCOPY WITH PROPOFOL  N/A 03/11/2015   Procedure: COLONOSCOPY WITH PROPOFOL ;  Surgeon: Stephens Eis, MD;  Location: [facility] ENDOSCOPY;  Service: Gastroenterology;  Laterality: N/A;    Current Medications: Current Facility-Administered Medications  Medication Dose Route Frequency Provider Last Rate Last Admin   acetaminophen  (TYLENOL ) tablet 650 mg  650 mg Oral Q6H PRN Onuoha, Chinwendu V, NP       alum & mag hydroxide-simeth (MAALOX/MYLANTA) 200-200-20 MG/5ML suspension 15 mL  15 mL Oral Q6H PRN Onuoha, Chinwendu V, NP       ARIPiprazole  (ABILIFY ) tablet 10 mg  10 mg Oral BID Jalynne Persico, MD   10 mg at  09/20/23 8295   glipiZIDE  (GLUCOTROL ) tablet 5 mg  5 mg Oral QAC breakfast Onuoha, Chinwendu V, NP   5 mg at 09/20/23 0944   insulin  aspart (novoLOG ) injection 0-15 Units  0-15 Units Subcutaneous TID WC Onuoha, Chinwendu V, NP   3 Units at 09/20/23 0802   insulin  glargine-yfgn (SEMGLEE ) injection 25 Units  25 Units Subcutaneous BID Djan, Prince T, MD   25 Units at 09/20/23 6213   linagliptin  (TRADJENTA ) tablet 5 mg  5 mg Oral Daily Antoniette Batty T, MD   5 mg at 09/20/23 0865   magnesium  hydroxide (MILK OF MAGNESIA) suspension 15 mL  15 mL Oral Daily PRN Onuoha, Chinwendu V, NP       metFORMIN  (GLUCOPHAGE ) tablet 1,000 mg  1,000 mg Oral BID WC Antoniette Batty T, MD   1,000 mg at 09/20/23 0944   OLANZapine  (ZYPREXA ) injection 5 mg  5 mg Intramuscular TID PRN Onuoha, Chinwendu V, NP       OLANZapine  zydis (ZYPREXA ) disintegrating tablet 5 mg  5 mg Oral TID PRN Onuoha, Chinwendu V, NP   5 mg at 09/19/23 0141   Oxcarbazepine  (TRILEPTAL ) tablet 300 mg  300 mg Oral BID Ethyn Schetter, MD   300 mg at 09/20/23 0945   traZODone  (DESYREL ) tablet 100 mg  100 mg Oral QHS Shameria Trimarco, MD   100 mg at 09/19/23 2204    Lab Results:  Results for orders placed or performed during the hospital encounter of 09/14/23 (from the past 48 hours)  Glucose, capillary     Status: Abnormal   Collection Time: 09/18/23  8:10 PM  Result Value Ref Range   Glucose-Capillary 313 (H) 70 - 99 mg/dL    Comment: Glucose reference range applies only to samples taken after fasting for at least 8 hours.  Basic metabolic panel     Status: Abnormal   Collection Time: 09/19/23  6:11 AM  Result Value Ref Range   Sodium 139 135 - 145 mmol/L   Potassium 4.1 3.5 - 5.1 mmol/L   Chloride 111 98 - 111 mmol/L   CO2 22 22 - 32 mmol/L   Glucose, Bld 123 (H) 70 - 99 mg/dL    Comment: Glucose reference range applies only to samples taken after fasting for at least 8 hours.   BUN 18 8 - 23 mg/dL   Creatinine, Ser 7.84 0.44 - 1.00 mg/dL    Calcium  9.1 8.9 - 10.3 mg/dL   GFR, Estimated >69 >62 mL/min    Comment: (NOTE) Calculated using the CKD-EPI Creatinine Equation (2021)    Anion gap 6 5 - 15    Comment: Performed at [facility], [address] [place], [place] [address]  Glucose, capillary     Status: Abnormal   Collection Time: 09/19/23  7:40 AM  Result Value Ref Range   Glucose-Capillary 116 (H) 70 - 99 mg/dL    Comment: Glucose reference range applies only to samples taken after fasting for at least 8 hours.  Glucose, capillary     Status: Abnormal   Collection Time: 09/19/23 11:49 AM  Result Value Ref Range   Glucose-Capillary 138 (H) 70 - 99 mg/dL    Comment: Glucose reference range applies only to samples taken after fasting for at least 8 hours.  Glucose, capillary     Status: Abnormal   Collection Time: 09/19/23  4:31 PM  Result Value Ref Range   Glucose-Capillary 108 (H) 70 - 99 mg/dL    Comment: Glucose reference range applies only to samples taken after fasting for at least 8 hours.  Glucose, capillary     Status: Abnormal   Collection Time: 09/19/23  7:50 PM  Result Value Ref Range   Glucose-Capillary 183 (H) 70 - 99 mg/dL    Comment: Glucose reference range applies only to samples taken after fasting for at least 8 hours.  Basic metabolic panel     Status: None   Collection Time: 09/20/23  6:21 AM  Result Value Ref Range   Sodium 143 135 - 145 mmol/L   Potassium 3.9 3.5 - 5.1 mmol/L   Chloride 111 98 - 111 mmol/L   CO2 22 22 - 32 mmol/L   Glucose, Bld 81 70 - 99 mg/dL    Comment: Glucose reference range applies only to samples taken after fasting for at least 8 hours.   BUN 19 8 - 23 mg/dL   Creatinine, Ser 1.61 0.44 - 1.00 mg/dL   Calcium  9.6 8.9 - 10.3 mg/dL   GFR, Estimated >09 >60 mL/min    Comment: (NOTE) Calculated using the CKD-EPI Creatinine Equation (2021)    Anion gap 10 5 - 15    Comment: Performed at [facility], [address] [place], [place] [address]   Glucose, capillary     Status: None   Collection Time: 09/20/23  7:37 AM  Result Value Ref Range   Glucose-Capillary 78 70 - 99 mg/dL    Comment: Glucose reference range applies only to samples taken after fasting for at least 8 hours.  Glucose, capillary     Status: Abnormal   Collection Time: 09/20/23 11:29 AM  Result Value Ref Range   Glucose-Capillary 102 (H) 70 - 99 mg/dL    Comment: Glucose reference range applies only to samples taken after fasting for at least 8 hours.    Blood Alcohol level:  Lab Results  Component Value Date   ETH <10 09/12/2023   ETH <10 02/26/2022    Metabolic Disorder Labs: Lab Results  Component Value Date   HGBA1C 7.2 (H) 09/12/2023   MPG 159.94 09/12/2023   MPG 191.51 03/06/2022   Lab Results  Component Value Date   PROLACTIN 23.3 02/20/2017   PROLACTIN 3.6 (L) 07/31/2016   Lab Results  Component Value Date   CHOL 194 03/06/2022   TRIG 113 03/06/2022   HDL 63 03/06/2022   CHOLHDL 3.1 03/06/2022   VLDL 23 03/06/2022   LDLCALC 108 (H) 03/06/2022   LDLCALC 131 (H) 02/20/2017    Physical Findings: AIMS:  , ,  ,  ,    CIWA:    COWS:      Psychiatric Specialty Exam:  Presentation  General Appearance:  Bizarre; Casual  Eye Contact: Minimal  Speech: Blocked  Speech Volume: Decreased  Mood and Affect  Mood: Irritable  Affect: Labile   Thought Process  Thought Processes: Disorganized  Descriptions of Associations:Loose  Orientation:Partial  Thought Content:Illogical; Paranoid Ideation; Delusions  Hallucinations:No data recorded  Ideas of Reference:Delusions; Paranoia  Suicidal Thoughts:denies  Homicidal Thoughts:denies   Sensorium  Memory: -- (unable to assess)  Judgment: Impaired  Insight: Shallow   Executive Functions  Concentration: Poor  Attention Span: Poor  Recall: Poor  Fund of Knowledge: Poor  Language: Poor   Psychomotor Activity   slow  Musculoskeletal: Strength & Muscle Tone: within normal limits Gait & Station: normal Assets  Assets: Resilience; Social Support    Physical Exam: Physical Exam Vitals and nursing note reviewed.    Review of Systems  Unable to perform ROS: Psychiatric disorder   Blood pressure 112/80, pulse 95, temperature 98.1 F (36.7 C), resp. rate 18, height 5\' 2"  (1.575 m), weight 71.4 kg, SpO2 99%. Body mass index is 28.81 kg/m.  Diagnosis: Principal Problem:   Schizoaffective disorder, bipolar type (HCC) Active Problems:   Auditory hallucinations  Clinical Decision Making: Patient with history of schizoaffective disorder currently noncompliant with medication, displaying significant amount of disorganized behavior, leaving the house and wandering/driving on the streets aimlessly, endorsing command type auditory hallucinations telling her to kill herself, lacks insight and refusing treatment.  Patient is currently admitted to geropsych unit on IVC for further stabilization.   Treatment Plan Summary:   Safety and Monitoring:             -- Involuntary admission to inpatient psychiatric unit for safety, stabilization and treatment             -- Daily contact with patient to assess and evaluate symptoms and progress in treatment             -- Patient's case to be discussed in multi-disciplinary team meeting             -- Observation Level: q15 minute checks             -- Vital signs:  q12 hours             -- Precautions: suicide, elopement, and assault   2. Psychiatric Diagnoses and Treatment:             Abilify  10 mg BID  Abilify  maintaina 400 mg IM/ on 4 /25/25  Trileptal  300mg  bID for mood stabilization If patient refuses oral medication will initiate enforced medication consult     -- The risks/benefits/side-effects/alternatives to this medication were discussed in detail with the patient and time was given for questions. The patient consents to medication trial.                 -- Metabolic profile and EKG monitoring obtained while on an atypical antipsychotic (BMI: Lipid Panel: HbgA1c: QTc:)              -- Encouraged patient to participate in unit milieu and in scheduled group therapies                            3. Medical Issues Being Addressed:  Hospitalist consult is made given her blood sugars more than 550 and her insulin  is adjusted   4. Discharge Planning:              -- Social work and case management to assist with discharge planning and identification of hospital follow-up needs prior to discharge             --  Estimated LOS: 4-5 days             -- Discharge Concerns: Need to establish a safety plan; Medication compliance and effectiveness             -- Discharge Goals: Return home with outpatient referrals follow ups   Physician Treatment Plan for Primary Diagnosis: Schizoaffective disorder, bipolar type (HCC) Long Term Goal(s): Improvement in symptoms so as ready for discharge   Short Term Goals: Ability to identify changes in lifestyle to reduce recurrence of condition will improve, Ability to verbalize feelings will improve, Ability to disclose and discuss suicidal ideas, Ability to demonstrate self-control will improve, and Ability to identify and develop effective coping behaviors will improve   Physician Treatment Plan for Secondary Diagnosis: Principal Problem:   Schizoaffective disorder, bipolar type (HCC) Active Problems:   Auditory hallucinations   Long Term Goal(s): Improvement in symptoms so as ready for discharge   Short Term Goals: Ability to identify changes in lifestyle to reduce recurrence of condition will improve, Ability to verbalize feelings will improve, Ability to disclose and discuss suicidal ideas, Ability to demonstrate self-control will improve, and Ability to identify and develop effective coping behaviors will improve  Aurelia Blotter, MD 09/20/2023, 4:30 PM

## 2023-09-20 NOTE — Group Note (Signed)
 Date:  09/20/2023 Time:  9:41 PM  Group Topic/Focus:  Wrap-Up Group:   The focus of this group is to help patients review their daily goal of treatment and discuss progress on daily workbooks.    Participation Level:  Did Not Attend  Participation Quality:      Affect:      Cognitive:      Insight: None  Engagement in Group:  None  Modes of Intervention:      Additional Comments:    Rolland Cline 09/20/2023, 9:41 PM

## 2023-09-20 NOTE — BH IP Treatment Plan (Signed)
 Interdisciplinary Treatment and Diagnostic Plan Update  09/20/2023 Time of Session: 10:00 AM  Rita Sullivan MRN: [id number]  Principal Diagnosis: Schizoaffective disorder, bipolar type (HCC)  Secondary Diagnoses: Principal Problem:   Schizoaffective disorder, bipolar type (HCC) Active Problems:   Auditory hallucinations   Current Medications:  Current Facility-Administered Medications  Medication Dose Route Frequency Provider Last Rate Last Admin   acetaminophen  (TYLENOL ) tablet 650 mg  650 mg Oral Q6H PRN Onuoha, Chinwendu V, NP       alum & mag hydroxide-simeth (MAALOX/MYLANTA) 200-200-20 MG/5ML suspension 15 mL  15 mL Oral Q6H PRN Onuoha, Chinwendu V, NP       ARIPiprazole  (ABILIFY ) tablet 10 mg  10 mg Oral BID Jadapalle, Sree, MD   10 mg at 09/20/23 4098   glipiZIDE  (GLUCOTROL ) tablet 5 mg  5 mg Oral QAC breakfast Onuoha, Chinwendu V, NP   5 mg at 09/20/23 0944   insulin  aspart (novoLOG ) injection 0-15 Units  0-15 Units Subcutaneous TID WC Onuoha, Chinwendu V, NP   3 Units at 09/20/23 0802   insulin  glargine-yfgn (SEMGLEE ) injection 25 Units  25 Units Subcutaneous BID Djan, Prince T, MD   25 Units at 09/20/23 1191   linagliptin  (TRADJENTA ) tablet 5 mg  5 mg Oral Daily Antoniette Batty T, MD   5 mg at 09/20/23 4782   magnesium  hydroxide (MILK OF MAGNESIA) suspension 15 mL  15 mL Oral Daily PRN Onuoha, Chinwendu V, NP       metFORMIN  (GLUCOPHAGE ) tablet 1,000 mg  1,000 mg Oral BID WC Zhang, Ping T, MD   1,000 mg at 09/20/23 0944   OLANZapine  (ZYPREXA ) injection 5 mg  5 mg Intramuscular TID PRN Onuoha, Chinwendu V, NP       OLANZapine  zydis (ZYPREXA ) disintegrating tablet 5 mg  5 mg Oral TID PRN Onuoha, Chinwendu V, NP   5 mg at 09/19/23 0141   Oxcarbazepine  (TRILEPTAL ) tablet 300 mg  300 mg Oral BID Jadapalle, Sree, MD   300 mg at 09/20/23 0945   traZODone  (DESYREL ) tablet 100 mg  100 mg Oral QHS Jadapalle, Sree, MD   100 mg at 09/19/23 2204   PTA Medications: Medications Prior to  Admission  Medication Sig Dispense Refill Last Dose/Taking   ARIPiprazole  (ABILIFY ) 20 MG tablet Take 1 tablet (20 mg total) by mouth every morning. 30 tablet 1 Unknown   atorvastatin  (LIPITOR) 40 MG tablet Take 1 tablet (40 mg total) by mouth daily. 30 tablet 1 Unknown   carbamazepine  (TEGRETOL ) 200 MG tablet Take 2 tablets (400 mg total) by mouth 3 (three) times daily. 90 tablet 1 Unknown   cholecalciferol  (VITAMIN D3) 25 MCG (1000 UNIT) tablet Take 2 tablets (2,000 Units total) by mouth daily. (Patient not taking: Reported on 09/13/2023) 60 tablet 1 Unknown   folic acid  (FOLVITE ) 1 MG tablet Take 0.5 tablets (0.5 mg total) by mouth daily. (Patient not taking: Reported on 09/13/2023) 30 tablet 1 Unknown   glipiZIDE  (GLUCOTROL ) 5 MG tablet Take 1 tablet (5 mg total) by mouth daily with breakfast. 30 tablet 1 Unknown   insulin  glargine-yfgn (SEMGLEE ) 100 UNIT/ML injection Inject 0.1 mLs (10 Units total) into the skin at bedtime. (Patient not taking: Reported on 09/13/2023) 10 mL 1 Unknown   JANUVIA 50 MG tablet Take 50 mg by mouth daily.   Unknown   losartan  (COZAAR ) 25 MG tablet Take 1 tablet (25 mg total) by mouth daily. 30 tablet 1 Unknown   metFORMIN  (GLUCOPHAGE ) 500 MG tablet Take 1,000 mg by  mouth 2 (two) times daily with a meal.   Unknown   traZODone  (DESYREL ) 100 MG tablet Take 1 tablet (100 mg total) by mouth at bedtime. 30 tablet 1 Unknown    Patient Stressors: Medication change or noncompliance    Patient Strengths: Average or above average intelligence  Communication skills  Supportive family/friends   Treatment Modalities: Medication Management, Group therapy, Case management,  1 to 1 session with clinician, Psychoeducation, Recreational therapy.   Physician Treatment Plan for Primary Diagnosis: Schizoaffective disorder, bipolar type (HCC) Long Term Goal(s): Improvement in symptoms so as ready for discharge   Short Term Goals: Ability to identify changes in lifestyle to reduce  recurrence of condition will improve Ability to verbalize feelings will improve Ability to disclose and discuss suicidal ideas Ability to demonstrate self-control will improve Ability to identify and develop effective coping behaviors will improve  Medication Management: Evaluate patient's response, side effects, and tolerance of medication regimen.  Therapeutic Interventions: 1 to 1 sessions, Unit Group sessions and Medication administration.  Evaluation of Outcomes: Progressing  Physician Treatment Plan for Secondary Diagnosis: Principal Problem:   Schizoaffective disorder, bipolar type (HCC) Active Problems:   Auditory hallucinations  Long Term Goal(s): Improvement in symptoms so as ready for discharge   Short Term Goals: Ability to identify changes in lifestyle to reduce recurrence of condition will improve Ability to verbalize feelings will improve Ability to disclose and discuss suicidal ideas Ability to demonstrate self-control will improve Ability to identify and develop effective coping behaviors will improve     Medication Management: Evaluate patient's response, side effects, and tolerance of medication regimen.  Therapeutic Interventions: 1 to 1 sessions, Unit Group sessions and Medication administration.  Evaluation of Outcomes: Progressing   RN Treatment Plan for Primary Diagnosis: Schizoaffective disorder, bipolar type (HCC) Long Term Goal(s): Knowledge of disease and therapeutic regimen to maintain health will improve  Short Term Goals: Ability to remain free from injury will improve, Ability to verbalize frustration and anger appropriately will improve, Ability to demonstrate self-control, Ability to participate in decision making will improve, Ability to verbalize feelings will improve, Ability to disclose and discuss suicidal ideas, Ability to identify and develop effective coping behaviors will improve, and Compliance with prescribed medications will  improve  Medication Management: RN will administer medications as ordered by provider, will assess and evaluate patient's response and provide education to patient for prescribed medication. RN will report any adverse and/or side effects to prescribing provider.  Therapeutic Interventions: 1 on 1 counseling sessions, Psychoeducation, Medication administration, Evaluate responses to treatment, Monitor vital signs and CBGs as ordered, Perform/monitor CIWA, COWS, AIMS and Fall Risk screenings as ordered, Perform wound care treatments as ordered.  Evaluation of Outcomes: Progressing   LCSW Treatment Plan for Primary Diagnosis: Schizoaffective disorder, bipolar type (HCC) Long Term Goal(s): Safe transition to appropriate next level of care at discharge, Engage patient in therapeutic group addressing interpersonal concerns.  Short Term Goals: Engage patient in aftercare planning with referrals and resources, Increase social support, Increase ability to appropriately verbalize feelings, Increase emotional regulation, Facilitate acceptance of mental health diagnosis and concerns, Facilitate patient progression through stages of change regarding substance use diagnoses and concerns, Identify triggers associated with mental health/substance abuse issues, and Increase skills for wellness and recovery  Therapeutic Interventions: Assess for all discharge needs, 1 to 1 time with Social worker, Explore available resources and support systems, Assess for adequacy in community support network, Educate family and significant other(s) on suicide prevention, Complete Psychosocial Assessment, Interpersonal  group therapy.  Evaluation of Outcomes: Progressing  Progress in Treatment: Attending groups: Yes. and No. Participating in groups: Yes. and No. Taking medication as prescribed: Yes. Toleration medication: Yes. Family/Significant other contact made: Yes, individual(s) contacted:  Ruperto Zolayvar, husband,  [phone]  Patient understands diagnosis: No. Discussing patient identified problems/goals with staff: No. Medical problems stabilized or resolved: Yes. Denies suicidal/homicidal ideation: Yes. Issues/concerns per patient self-inventory: No. Other: None    New problem(s) identified: No, Describe:  None identified Update 09/20/23: No changes at this time    New Short Term/Long Term Goal(s):  elimination of symptoms of psychosis, medication management for mood stabilization; elimination of SI thoughts; development of comprehensive mental wellness plan. Update 09/20/23: No changes at this time    Patient Goals:  "I'm not really sick, I don't really need to be here. I'm not a psychiatric patient in this hospital am I? Update 09/20/23: Pt received LAI and is interacting more appropriately with peers and staff on the unit    Discharge Plan or Barriers: CSW will assist with appropriate discharge planning Update 09/20/23: No changes at this time    Reason for Continuation of Hospitalization: Hallucinations Medication stabilization   Estimated Length of Stay: 1 to 7 days Update 09/20/23: TBD  Last 3 [place] Suicide Severity Risk Score: Flowsheet Row Admission (Current) from 09/14/2023 in [facility] ED from 09/12/2023 in [facility] Admission (Discharged) from 02/26/2022 in [facility] BEHAVIORAL MEDICINE  C-SSRS RISK CATEGORY Moderate Risk No Risk No Risk       Last PHQ 2/9 Scores:    05/02/2015    1:50 PM  Depression screen PHQ 2/9  Decreased Interest 0  Down, Depressed, Hopeless 0  PHQ - 2 Score 0    Scribe for Treatment Team: Sharl Davies 09/20/2023 4:38 PM

## 2023-09-20 NOTE — BHH Counselor (Signed)
 CSW contacted [facility] [phone] and spoke with Larena Plaza. who reports referrals can be sent to:  Fax: [phone] Office:[phone] [email or web address]  Nellie Banas reports that referrals must include a CCA or similar documentation.   CSW faxed referral to [phone] per their request.   Referral included H&P, facesheet, current meds, initial consult note from ED, and last 3 days of progress notes.  Derrill Flirt, MSW, [place] 09/20/2023 3:14 PM

## 2023-09-20 NOTE — Group Note (Signed)
 Date:  09/20/2023 Time:  11:55 AM  Group Topic/Focus:  Goals Group:   The focus of this group is to help patients establish daily goals to achieve during treatment and discuss how the patient can incorporate goal setting into their daily lives to aide in recovery.    Participation Level:  Active  Participation Quality:  Appropriate  Affect:  Appropriate  Cognitive:  Appropriate  Insight: Appropriate  Engagement in Group:  Engaged  Modes of Intervention:  Discussion   Dow Gemma 09/20/2023, 11:55 AM

## 2023-09-21 DIAGNOSIS — F250 Schizoaffective disorder, bipolar type: Secondary | ICD-10-CM | POA: Diagnosis not present

## 2023-09-21 LAB — GLUCOSE, CAPILLARY
Glucose-Capillary: 123 mg/dL — ABNORMAL HIGH (ref 70–99)
Glucose-Capillary: 223 mg/dL — ABNORMAL HIGH (ref 70–99)
Glucose-Capillary: 101 mg/dL — ABNORMAL HIGH (ref 70–99)
Glucose-Capillary: 188 mg/dL — ABNORMAL HIGH (ref 70–99)

## 2023-09-21 LAB — BASIC METABOLIC PANEL WITH GFR
Anion gap: 4 — ABNORMAL LOW (ref 5–15)
BUN: 18 mg/dL (ref 8–23)
CO2: 21 mmol/L — ABNORMAL LOW (ref 22–32)
Calcium: 9.3 mg/dL (ref 8.9–10.3)
Chloride: 111 mmol/L (ref 98–111)
Creatinine, Ser: 0.73 mg/dL (ref 0.44–1.00)
GFR, Estimated: >60 mL/min (ref >60)
Glucose, Bld: 130 mg/dL — ABNORMAL HIGH (ref 70–99)
Potassium: 4 mmol/L (ref 3.5–5.1)
Sodium: 136 mmol/L (ref 135–145)

## 2023-09-21 NOTE — B[facility] Counselor (Signed)
 CSW contacted [facility] to see if referral was received for ACT services.   Leola Raisin at [facility] reports that referral was received and she would like to come see pt tomorrow. Reports she will text CSW in the morning to confirm time.   CSW awaits meeting with Leola Raisin at this time.    macular degeneration

## 2023-09-21 NOTE — Progress Notes (Signed)
   09/21/23 0100  Psych Admission Type (Psych Patients Only)  Admission Status Involuntary  Psychosocial Assessment  Patient Complaints Confusion  Eye Contact Brief  Facial Expression Anxious  Affect Anxious  Speech Soft  Interaction Isolative  Motor Activity Slow  Appearance/Hygiene In scrubs  Behavior Characteristics Appropriate to situation;Cooperative  Mood Labile  Thought Process  Coherency WDL  Content WDL  Delusions None reported or observed  Perception WDL  Hallucination None reported or observed  Judgment Impaired  Confusion Mild  Danger to Self  Current suicidal ideation? Denies (Denies)  Agreement Not to Harm Self Yes  Description of Agreement verbal  Danger to Others  Danger to Others None reported or observed

## 2023-09-21 NOTE — Group Note (Signed)
 Date:  09/21/2023 Time:  8:48 PM  Group Topic/Focus:  Wrap-Up Group:   The focus of this group is to help patients review their daily goal of treatment and discuss progress on daily workbooks.    Participation Level:  Active  Participation Quality:  Appropriate, Attentive, and Sharing  Affect:  Appropriate  Cognitive:  Alert and Appropriate  Insight: Appropriate, Good, and Improving  Engagement in Group:  Engaged and Improving  Modes of Intervention:  Discussion, Rapport Building, Socialization, and Support  Additional Comments:     Merla Sawka 09/21/2023, 8:48 PM

## 2023-09-21 NOTE — Plan of Care (Signed)
   Problem: Education: Goal: Ability to describe self-care measures that may prevent or decrease complications (Diabetes Survival Skills Education) will improve Outcome: Progressing Goal: Individualized Educational Video(s) Outcome: Progressing   Problem: Coping: Goal: Ability to adjust to condition or change in health will improve Outcome: Progressing

## 2023-09-21 NOTE — B[facility] Counselor (Signed)
 CSW contacted pt's husband, Ruperto Zolayvar per request of provider to see if he would be able to visit pt to get an understanding of pt's baseline.   CSW unable to reach, left HIPAA compliant VM requesting return call   Derrill Flirt, MSW, [facility] 09/21/2023 1:56 PM

## 2023-09-21 NOTE — Group Note (Signed)
 Recreation Therapy Group Note   Group Topic:Coping Skills  Group Date: 09/21/2023 Start Time: 1100 End Time: 1140 Facilitators: Deatrice Factor, LRT, CTRS Location:  Dayroom  Group Description: Seated Exercise. LRT discussed the mental and physical benefits of exercise. LRT and group discussed how physical activity can be used as a coping skill. Pt's and LRT followed along to an exercise video on the TV screen that provided a visual representation and audio description of every exercise performed. Pt's encouraged to listen to their bodies and stop at any time if they experience feelings of discomfort or pain. Pts were encouraged to drink water and stay hydrated.   Goal Area(s) Addressed: Patient will learn benefits of physical activity. Patient will identify exercise as a coping skill.  Patient will follow multistep directions. Patient will try a new leisure interest.    Affect/Mood: Appropriate   Participation Level: Active and Engaged   Participation Quality: Independent   Behavior: Appropriate, Calm, and Cooperative   Speech/Thought Process: Coherent   Insight: Good   Judgement: Good   Modes of Intervention: Activity, Education, and Exploration   Patient Response to Interventions:  Attentive, Engaged, Interested , and Receptive   Education Outcome:  Acknowledges education   Clinical Observations/Individualized Feedback: Jahliyah was active in their participation of session activities and group discussion. Pt completed all exercises as prompted. Pt interacted well with LRT and peers duration of session.    Plan: Continue to engage patient in RT group sessions 2-3x/week.   [address], LRT, CTRS 09/21/2023 1:26 PM

## 2023-09-21 NOTE — Progress Notes (Signed)
 [facility] MD Progress Note  09/21/2023 9:39 PM Rita Sullivan  MRN:  [id number] Patient is a 68 year old female with history of schizoaffective disorder, bipolar type presented with command auditory hallucinations telling her to kill self. Patient currently admitted to geropsych unit for further stabilization   Subjective:  Chart reviewed, case discussed in multidisciplinary meeting, patient seen during rounds.  Patient is noted to be resting in her room.  She offers no complaints.  She initially denies auditory hallucinations but later tells the provider that "they are telling me to say no ".  When the provider asked if the voices are bothering her she showed no.  She denies visual hallucinations.  She denies feeling depressed or anxious.  She denies SI/HI/plan.  She is taking her medications and denies having any side effects.    Sleep: Fair  Appetite:  Fair  Past Psychiatric History: see h&P Family History:  Family History  Problem Relation Age of Onset   Diabetes Father    Diabetes Sister    Social History:  Social History   Substance and Sexual Activity  Alcohol Use Yes   Alcohol/week: 2.0 standard drinks of alcohol   Types: 2 Glasses of wine per week   Comment: Occ     Social History   Substance and Sexual Activity  Drug Use No    Social History   Socioeconomic History   Marital status: Married    Spouse name: Not on file   Number of children: Not on file   Years of education: Not on file   Highest education level: Not on file  Occupational History   Not on file  Tobacco Use   Smoking status: Never   Smokeless tobacco: Never  Vaping Use   Vaping status: Not on file  Substance and Sexual Activity   Alcohol use: Yes    Alcohol/week: 2.0 standard drinks of alcohol    Types: 2 Glasses of wine per week    Comment: Occ   Drug use: No   Sexual activity: Never  Other Topics Concern   Not on file  Social History Narrative   Not on file   Social Drivers of Health    Financial Resource Strain: Low Risk  (02/23/2023)   Received from [facility]   Overall Financial Resource Strain (CARDIA)    Difficulty of Paying Living Expenses: Not very hard  Food Insecurity: No Food Insecurity (09/14/2023)   Hunger Vital Sign    Worried About Running Out of Food in the Last Year: Never true    Ran Out of Food in the Last Year: Never true  Transportation Needs: No Transportation Needs (09/14/2023)   PRAPARE - Administrator, Civil Service (Medical): No    Lack of Transportation (Non-Medical): No  Physical Activity: Not on file  Stress: Not on file  Social Connections: Socially Isolated (09/14/2023)   Social Connection and Isolation Panel [NHANES]    Frequency of Communication with Friends and Family: Never    Frequency of Social Gatherings with Friends and Family: Never    Attends Religious Services: Never    Database administrator or Organizations: No    Attends Engineer, structural: Never    Marital Status: Married   Past Medical History:  Past Medical History:  Diagnosis Date   Bipolar disorder (HCC)    Chronic kidney disease    Diabetes mellitus without complication (HCC)    Hypercholesteremia    Manic behavior (HCC)  Past Surgical History:  Procedure Laterality Date   COLONOSCOPY WITH PROPOFOL  N/A 03/11/2015   Procedure: COLONOSCOPY WITH PROPOFOL ;  Surgeon: Stephens Eis, MD;  Location: [facility] ENDOSCOPY;  Service: Gastroenterology;  Laterality: N/A;    Current Medications: Current Facility-Administered Medications  Medication Dose Route Frequency Provider Last Rate Last Admin   acetaminophen  (TYLENOL ) tablet 650 mg  650 mg Oral Q6H PRN Onuoha, Chinwendu V, NP       alum & mag hydroxide-simeth (MAALOX/MYLANTA) 200-200-20 MG/5ML suspension 15 mL  15 mL Oral Q6H PRN Onuoha, Chinwendu V, NP       ARIPiprazole  (ABILIFY ) tablet 10 mg  10 mg Oral BID Sigmund Morera, MD   10 mg at 09/21/23 2123   glipiZIDE  (GLUCOTROL )  tablet 5 mg  5 mg Oral QAC breakfast Onuoha, Chinwendu V, NP   5 mg at 09/21/23 0920   insulin  aspart (novoLOG ) injection 0-15 Units  0-15 Units Subcutaneous TID WC Onuoha, Chinwendu V, NP   5 Units at 09/21/23 1250   insulin  glargine-yfgn (SEMGLEE ) injection 25 Units  25 Units Subcutaneous BID Djan, Prince T, MD   25 Units at 09/21/23 2125   linagliptin  (TRADJENTA ) tablet 5 mg  5 mg Oral Daily Antoniette Batty T, MD   5 mg at 09/21/23 6578   magnesium  hydroxide (MILK OF MAGNESIA) suspension 15 mL  15 mL Oral Daily PRN Onuoha, Chinwendu V, NP       metFORMIN  (GLUCOPHAGE ) tablet 1,000 mg  1,000 mg Oral BID WC Antoniette Batty T, MD   1,000 mg at 09/21/23 1733   OLANZapine  (ZYPREXA ) injection 5 mg  5 mg Intramuscular TID PRN Onuoha, Chinwendu V, NP       OLANZapine  zydis (ZYPREXA ) disintegrating tablet 5 mg  5 mg Oral TID PRN Onuoha, Chinwendu V, NP   5 mg at 09/19/23 0141   Oxcarbazepine  (TRILEPTAL ) tablet 300 mg  300 mg Oral BID Chalsea Darko, MD   300 mg at 09/21/23 2123   traZODone  (DESYREL ) tablet 100 mg  100 mg Oral QHS Bayler Gehrig, MD   100 mg at 09/21/23 2124    Lab Results:  Results for orders placed or performed during the hospital encounter of 09/14/23 (from the past 48 hours)  Basic metabolic panel     Status: None   Collection Time: 09/20/23  6:21 AM  Result Value Ref Range   Sodium 143 135 - 145 mmol/L   Potassium 3.9 3.5 - 5.1 mmol/L   Chloride 111 98 - 111 mmol/L   CO2 22 22 - 32 mmol/L   Glucose, Bld 81 70 - 99 mg/dL    Comment: Glucose reference range applies only to samples taken after fasting for at least 8 hours.   BUN 19 8 - 23 mg/dL   Creatinine, Ser 4.69 0.44 - 1.00 mg/dL   Calcium  9.6 8.9 - 10.3 mg/dL   GFR, Estimated >62 >95 mL/min    Comment: (NOTE) Calculated using the CKD-EPI Creatinine Equation (2021)    Anion gap 10 5 - 15    Comment: Performed at [facility], [address] [place], [place] [address]  Glucose, capillary     Status: None    Collection Time: 09/20/23  7:37 AM  Result Value Ref Range   Glucose-Capillary 78 70 - 99 mg/dL    Comment: Glucose reference range applies only to samples taken after fasting for at least 8 hours.  Glucose, capillary     Status: Abnormal   Collection Time: 09/20/23 11:29 AM  Result Value Ref Range   Glucose-Capillary 102 (H) 70 - 99 mg/dL    Comment: Glucose reference range applies only to samples taken after fasting for at least 8 hours.  Glucose, capillary     Status: Abnormal   Collection Time: 09/20/23  4:45 PM  Result Value Ref Range   Glucose-Capillary 107 (H) 70 - 99 mg/dL    Comment: Glucose reference range applies only to samples taken after fasting for at least 8 hours.  Glucose, capillary     Status: Abnormal   Collection Time: 09/20/23  7:40 PM  Result Value Ref Range   Glucose-Capillary 149 (H) 70 - 99 mg/dL    Comment: Glucose reference range applies only to samples taken after fasting for at least 8 hours.  Basic metabolic panel     Status: Abnormal   Collection Time: 09/21/23  6:41 AM  Result Value Ref Range   Sodium 136 135 - 145 mmol/L   Potassium 4.0 3.5 - 5.1 mmol/L   Chloride 111 98 - 111 mmol/L   CO2 21 (L) 22 - 32 mmol/L   Glucose, Bld 130 (H) 70 - 99 mg/dL    Comment: Glucose reference range applies only to samples taken after fasting for at least 8 hours.   BUN 18 8 - 23 mg/dL   Creatinine, Ser 1.61 0.44 - 1.00 mg/dL   Calcium  9.3 8.9 - 10.3 mg/dL   GFR, Estimated >09 >60 mL/min    Comment: (NOTE) Calculated using the CKD-EPI Creatinine Equation (2021)    Anion gap 4 (L) 5 - 15    Comment: Performed at [facility], [address] [place], [place] [address]  Glucose, capillary     Status: Abnormal   Collection Time: 09/21/23  7:24 AM  Result Value Ref Range   Glucose-Capillary 123 (H) 70 - 99 mg/dL    Comment: Glucose reference range applies only to samples taken after fasting for at least 8 hours.  Glucose, capillary     Status: Abnormal    Collection Time: 09/21/23 11:58 AM  Result Value Ref Range   Glucose-Capillary 223 (H) 70 - 99 mg/dL    Comment: Glucose reference range applies only to samples taken after fasting for at least 8 hours.  Glucose, capillary     Status: Abnormal   Collection Time: 09/21/23  4:09 PM  Result Value Ref Range   Glucose-Capillary 101 (H) 70 - 99 mg/dL    Comment: Glucose reference range applies only to samples taken after fasting for at least 8 hours.  Glucose, capillary     Status: Abnormal   Collection Time: 09/21/23  7:23 PM  Result Value Ref Range   Glucose-Capillary 188 (H) 70 - 99 mg/dL    Comment: Glucose reference range applies only to samples taken after fasting for at least 8 hours.    Blood Alcohol level:  Lab Results  Component Value Date   ETH <10 09/12/2023   ETH <10 02/26/2022    Metabolic Disorder Labs: Lab Results  Component Value Date   HGBA1C 7.2 (H) 09/12/2023   MPG 159.94 09/12/2023   MPG 191.51 03/06/2022   Lab Results  Component Value Date   PROLACTIN 23.3 02/20/2017   PROLACTIN 3.6 (L) 07/31/2016   Lab Results  Component Value Date   CHOL 194 03/06/2022   TRIG 113 03/06/2022   HDL 63 03/06/2022   CHOLHDL 3.1 03/06/2022   VLDL 23 03/06/2022   LDLCALC 108 (H) 03/06/2022   LDLCALC 131 (  H) 02/20/2017    Physical Findings: AIMS:  , ,  ,  ,    CIWA:    COWS:      Psychiatric Specialty Exam:  Presentation  General Appearance:  Bizarre; Casual  Eye Contact: Minimal  Speech: Blocked  Speech Volume: Decreased    Mood and Affect  Mood: Irritable  Affect: Labile   Thought Process  Thought Processes: Disorganized  Descriptions of Associations:Loose  Orientation:Partial  Thought Content:Illogical; Paranoid Ideation; Delusions  Hallucinations:No data recorded  Ideas of Reference:Delusions; Paranoia  Suicidal Thoughts:denies  Homicidal Thoughts:denies   Sensorium  Memory: -- (unable to  assess)  Judgment: Impaired  Insight: Shallow   Executive Functions  Concentration: Poor  Attention Span: Poor  Recall: Poor  Fund of Knowledge: Poor  Language: Poor   Psychomotor Activity  slow  Musculoskeletal: Strength & Muscle Tone: within normal limits Gait & Station: normal Assets  Assets: Resilience; Social Support    Physical Exam: Physical Exam Vitals and nursing note reviewed.    Review of Systems  Unable to perform ROS: Psychiatric disorder   Blood pressure 93/76, pulse (!) 102, temperature 98.1 F (36.7 C), resp. rate 18, height 5\' 2"  (1.575 m), weight 71.4 kg, SpO2 100%. Body mass index is 28.81 kg/m.  Diagnosis: Principal Problem:   Schizoaffective disorder, bipolar type (HCC) Active Problems:   Auditory hallucinations  Clinical Decision Making: Patient with history of schizoaffective disorder currently noncompliant with medication, displaying significant amount of disorganized behavior, leaving the house and wandering/driving on the streets aimlessly, endorsing command type auditory hallucinations telling her to kill herself, lacks insight and refusing treatment.  Patient is currently admitted to geropsych unit on IVC for further stabilization.   Treatment Plan Summary:   Safety and Monitoring:             -- Involuntary admission to inpatient psychiatric unit for safety, stabilization and treatment             -- Daily contact with patient to assess and evaluate symptoms and progress in treatment             -- Patient's case to be discussed in multi-disciplinary team meeting             -- Observation Level: q15 minute checks             -- Vital signs:  q12 hours             -- Precautions: suicide, elopement, and assault   2. Psychiatric Diagnoses and Treatment:             Abilify  10 mg BID  Abilify  maintaina 400 mg IM/ on 4 /25/25  Trileptal  300mg  bID for mood stabilization If patient refuses oral medication will initiate  enforced medication consult     -- The risks/benefits/side-effects/alternatives to this medication were discussed in detail with the patient and time was given for questions. The patient consents to medication trial.                -- Metabolic profile and EKG monitoring obtained while on an atypical antipsychotic (BMI: Lipid Panel: HbgA1c: QTc:)              -- Encouraged patient to participate in unit milieu and in scheduled group therapies                            3. Medical Issues Being Addressed:  Hospitalist consult is  made given her blood sugars more than 550 and her insulin  is adjusted   4. Discharge Planning:              -- Social work and case management to assist with discharge planning and identification of hospital follow-up needs prior to discharge             -- Estimated LOS: 4-5 days             -- Discharge Concerns: Need to establish a safety plan; Medication compliance and effectiveness             -- Discharge Goals: Return home with outpatient referrals follow ups   Physician Treatment Plan for Primary Diagnosis: Schizoaffective disorder, bipolar type (HCC) Long Term Goal(s): Improvement in symptoms so as ready for discharge   Short Term Goals: Ability to identify changes in lifestyle to reduce recurrence of condition will improve, Ability to verbalize feelings will improve, Ability to disclose and discuss suicidal ideas, Ability to demonstrate self-control will improve, and Ability to identify and develop effective coping behaviors will improve   Physician Treatment Plan for Secondary Diagnosis: Principal Problem:   Schizoaffective disorder, bipolar type (HCC) Active Problems:   Auditory hallucinations   Long Term Goal(s): Improvement in symptoms so as ready for discharge   Short Term Goals: Ability to identify changes in lifestyle to reduce recurrence of condition will improve, Ability to verbalize feelings will improve, Ability to disclose and discuss  suicidal ideas, Ability to demonstrate self-control will improve, and Ability to identify and develop effective coping behaviors will improve  Rita Blotter, MD 09/21/2023, 9:39 PM

## 2023-09-21 NOTE — Progress Notes (Signed)
   09/21/23 1342  Psych Admission Type (Psych Patients Only)  Admission Status Involuntary  Psychosocial Assessment  Patient Complaints Worrying  Eye Contact Brief  Facial Expression Anxious;Worried  Affect Anxious  Speech Soft  Interaction Minimal  Motor Activity Slow  Appearance/Hygiene In scrubs  Behavior Characteristics Calm  Mood Sad  Thought Process  Coherency WDL  Content WDL  Delusions None reported or observed  Perception WDL  Hallucination None reported or observed  Judgment Impaired  Confusion Mild  Danger to Self  Current suicidal ideation? Denies  Agreement Not to Harm Self Yes  Description of Agreement verbal  Danger to Others  Danger to Others None reported or observed

## 2023-09-21 NOTE — Group Note (Signed)
 Recreation Therapy Group Note   Group Topic:General Recreation  Group Date: 09/21/2023 Start Time: 1500 End Time: 1600 Facilitators: Deatrice Factor, LRT, CTRS Location: Courtyard  Group Description: Outdoor Recreation. Patients had the option to play corn hole, ring toss, bowling or listening to music while outside in the courtyard getting fresh air and sunlight. LRT and patients discussed things that they enjoy doing in their free time outside of the hospital. LRT encouraged patients to drink water after being active and getting their heart rate up.   Goal Area(s) Addressed: Patient will identify leisure interests.  Patient will practice healthy decision making. Patient will engage in recreation activity.   Affect/Mood: Appropriate   Participation Level: Active   Participation Quality: Independent   Behavior: Appropriate   Speech/Thought Process: Coherent   Insight: Fair   Judgement: Fair    Modes of Intervention: Music, Rapport Building, and Socialization   Patient Response to Interventions:  Receptive   Education Outcome:  Acknowledges education   Clinical Observations/Individualized Feedback: Eila was active in their participation of session activities and group discussion. Pt interacted well with LRT and peers duration of session.    Plan: Continue to engage patient in RT group sessions 2-3x/week.   Deatrice Factor, LRT, CTRS 09/21/2023 4:53 PM

## 2023-09-22 DIAGNOSIS — F250 Schizoaffective disorder, bipolar type: Secondary | ICD-10-CM | POA: Diagnosis not present

## 2023-09-22 LAB — GLUCOSE, CAPILLARY
Glucose-Capillary: 197 mg/dL — ABNORMAL HIGH (ref 70–99)
Glucose-Capillary: 140 mg/dL — ABNORMAL HIGH (ref 70–99)
Glucose-Capillary: 107 mg/dL — ABNORMAL HIGH (ref 70–99)
Glucose-Capillary: 177 mg/dL — ABNORMAL HIGH (ref 70–99)

## 2023-09-22 NOTE — Group Note (Signed)
 Physical/Occupational Therapy Group Note  Group Topic: Functional, Dynamic Balance   Group Date: 09/22/2023 Start Time: 1300 End Time: 1345 Facilitators: Valdene Garret, PT   Group Description: Group discussed impact of balance on safety and independence with functional tasks.  Identified and discussed any self-perceived balance deficits to personalize information.  Discussed and reviewed strategies to address/improve balance deficits: use of assist devices, activity pacing/energy conservation, environment/home safety modifications, focusing attention/minimizing distraction.  Reviewed and participated with standing LE therex designed to target dynamic balance reactions and LE strength/stability; provided handouts with HEP to be utilized outside of group time as appropriate.  Allowed time for questions and further discussion on any balance or mobility concerns/needs.  Therapeutic Goal(s):  Identify and discuss any individual balance deficits and functional implications. Identify and discuss any environmental/home safety modifications that can optimize balance and safety for mobility within the home. Demonstrate understanding and performance of standing therex designed to target dynamic balance deficits.  Individual Participation:  Did not attend  Participation Level:   Participation Quality:   Behavior:   Speech/Thought Process:   Affect/Mood:   Insight:   Judgement:   Modes of Intervention:   Patient Response to Interventions:    Plan: Continue to engage patient in PT/OT groups 1 - 2x/week.   Alayah Knouff H. Bevin Bucks, PT, DPT, NCS 09/22/23, 4:41 PM [phone]

## 2023-09-22 NOTE — Group Note (Signed)
 Date:  09/22/2023 Time:  10:14 AM  Group Topic/Focus:  Self Care:   The focus of this group is to help patients understand the importance of self-care in order to improve or restore emotional, physical, spiritual, interpersonal, and financial health.    Participation Level:  Did Not Attend  Additional Comments:  Did not attend  Rita Sullivan Cleophus Dade 09/22/2023, 10:14 AM

## 2023-09-22 NOTE — Plan of Care (Signed)
 Patient was cooperative with treatment on the shift, she was compliant with medication regime. Up through out most of the night asking for snack and talkative with peers and staff. She remains was pleasant on approach, she denies SI, HI &  AVH.

## 2023-09-22 NOTE — Progress Notes (Signed)
 [facility] MD Progress Note  09/22/2023 4:19 PM Rita Sullivan  MRN:  [id number] Patient is a 68 year old female with history of schizoaffective disorder, bipolar type presented with command auditory hallucinations telling her to kill self. Patient currently admitted to geropsych unit for further stabilization   Subjective:  Chart reviewed, case discussed in multidisciplinary meeting, patient seen during rounds.  Patient is noted to be resting in bed.  She reports that she slept well and have her breakfast.  She did inquire about the possibility of her husband visiting her.  She reports the voices are mellowed away and denies visual hallucinations.  She denies feeling sad or anxious.  She denies suicidal/homicidal ideation/plan.  Per nursing report she is visible on the unit but stays in her room most of the time.  Team has reached out to her husband to visit patient and let the team know if she is at her baseline given her chronicity of symptoms of psychosis for most of her lifetime.   Sleep: Fair  Appetite:  Fair  Past Psychiatric History: see h&P Family History:  Family History  Problem Relation Age of Onset   Diabetes Father    Diabetes Sister    Social History:  Social History   Substance and Sexual Activity  Alcohol Use Yes   Alcohol/week: 2.0 standard drinks of alcohol   Types: 2 Glasses of wine per week   Comment: Occ     Social History   Substance and Sexual Activity  Drug Use No    Social History   Socioeconomic History   Marital status: Married    Spouse name: Not on file   Number of children: Not on file   Years of education: Not on file   Highest education level: Not on file  Occupational History   Not on file  Tobacco Use   Smoking status: Never   Smokeless tobacco: Never  Vaping Use   Vaping status: Not on file  Substance and Sexual Activity   Alcohol use: Yes    Alcohol/week: 2.0 standard drinks of alcohol    Types: 2 Glasses of wine per week     Comment: Occ   Drug use: No   Sexual activity: Never  Other Topics Concern   Not on file  Social History Narrative   Not on file   Social Drivers of Health   Financial Resource Strain: Low Risk  (02/23/2023)   Received from [facility]   Overall Financial Resource Strain (CARDIA)    Difficulty of Paying Living Expenses: Not very hard  Food Insecurity: No Food Insecurity (09/14/2023)   Hunger Vital Sign    Worried About Running Out of Food in the Last Year: Never true    Ran Out of Food in the Last Year: Never true  Transportation Needs: No Transportation Needs (09/14/2023)   PRAPARE - Administrator, Civil Service (Medical): No    Lack of Transportation (Non-Medical): No  Physical Activity: Not on file  Stress: Not on file  Social Connections: Socially Isolated (09/14/2023)   Social Connection and Isolation Panel [NHANES]    Frequency of Communication with Friends and Family: Never    Frequency of Social Gatherings with Friends and Family: Never    Attends Religious Services: Never    Database administrator or Organizations: No    Attends Banker Meetings: Never    Marital Status: Married   Past Medical History:  Past Medical History:  Diagnosis Date  Bipolar disorder (HCC)    Chronic kidney disease    Diabetes mellitus without complication (HCC)    Hypercholesteremia    Manic behavior (HCC)     Past Surgical History:  Procedure Laterality Date   COLONOSCOPY WITH PROPOFOL  N/A 03/11/2015   Procedure: COLONOSCOPY WITH PROPOFOL ;  Surgeon: Stephens Eis, MD;  Location: [facility] ENDOSCOPY;  Service: Gastroenterology;  Laterality: N/A;    Current Medications: Current Facility-Administered Medications  Medication Dose Route Frequency Provider Last Rate Last Admin   acetaminophen  (TYLENOL ) tablet 650 mg  650 mg Oral Q6H PRN Onuoha, Chinwendu V, NP       alum & mag hydroxide-simeth (MAALOX/MYLANTA) 200-200-20 MG/5ML suspension 15 mL  15 mL  Oral Q6H PRN Onuoha, Chinwendu V, NP       ARIPiprazole  (ABILIFY ) tablet 10 mg  10 mg Oral BID Jonel Weldon, MD   10 mg at 09/22/23 0820   glipiZIDE  (GLUCOTROL ) tablet 5 mg  5 mg Oral QAC breakfast Onuoha, Chinwendu V, NP   5 mg at 09/22/23 0818   insulin  aspart (novoLOG ) injection 0-15 Units  0-15 Units Subcutaneous TID WC Onuoha, Chinwendu V, NP   2 Units at 09/22/23 1157   insulin  glargine-yfgn (SEMGLEE ) injection 25 Units  25 Units Subcutaneous BID Djan, Prince T, MD   25 Units at 09/22/23 8185   linagliptin  (TRADJENTA ) tablet 5 mg  5 mg Oral Daily Antoniette Batty T, MD   5 mg at 09/22/23 6314   magnesium  hydroxide (MILK OF MAGNESIA) suspension 15 mL  15 mL Oral Daily PRN Onuoha, Chinwendu V, NP       metFORMIN  (GLUCOPHAGE ) tablet 1,000 mg  1,000 mg Oral BID WC Antoniette Batty T, MD   1,000 mg at 09/22/23 0818   OLANZapine  (ZYPREXA ) injection 5 mg  5 mg Intramuscular TID PRN Onuoha, Chinwendu V, NP       OLANZapine  zydis (ZYPREXA ) disintegrating tablet 5 mg  5 mg Oral TID PRN Onuoha, Chinwendu V, NP   5 mg at 09/19/23 0141   Oxcarbazepine  (TRILEPTAL ) tablet 300 mg  300 mg Oral BID Nefi Musich, MD   300 mg at 09/22/23 9702   traZODone  (DESYREL ) tablet 100 mg  100 mg Oral QHS Sherard Sutch, MD   100 mg at 09/21/23 2124    Lab Results:  Results for orders placed or performed during the hospital encounter of 09/14/23 (from the past 48 hours)  Glucose, capillary     Status: Abnormal   Collection Time: 09/20/23  4:45 PM  Result Value Ref Range   Glucose-Capillary 107 (H) 70 - 99 mg/dL    Comment: Glucose reference range applies only to samples taken after fasting for at least 8 hours.  Glucose, capillary     Status: Abnormal   Collection Time: 09/20/23  7:40 PM  Result Value Ref Range   Glucose-Capillary 149 (H) 70 - 99 mg/dL    Comment: Glucose reference range applies only to samples taken after fasting for at least 8 hours.  Basic metabolic panel     Status: Abnormal   Collection Time:  09/21/23  6:41 AM  Result Value Ref Range   Sodium 136 135 - 145 mmol/L   Potassium 4.0 3.5 - 5.1 mmol/L   Chloride 111 98 - 111 mmol/L   CO2 21 (L) 22 - 32 mmol/L   Glucose, Bld 130 (H) 70 - 99 mg/dL    Comment: Glucose reference range applies only to samples taken after fasting for at least 8 hours.  BUN 18 8 - 23 mg/dL   Creatinine, Ser 1.61 0.44 - 1.00 mg/dL   Calcium  9.3 8.9 - 10.3 mg/dL   GFR, Estimated >09 >60 mL/min    Comment: (NOTE) Calculated using the CKD-EPI Creatinine Equation (2021)    Anion gap 4 (L) 5 - 15    Comment: Performed at [facility], [address] [place], [place] [address]  Glucose, capillary     Status: Abnormal   Collection Time: 09/21/23  7:24 AM  Result Value Ref Range   Glucose-Capillary 123 (H) 70 - 99 mg/dL    Comment: Glucose reference range applies only to samples taken after fasting for at least 8 hours.  Glucose, capillary     Status: Abnormal   Collection Time: 09/21/23 11:58 AM  Result Value Ref Range   Glucose-Capillary 223 (H) 70 - 99 mg/dL    Comment: Glucose reference range applies only to samples taken after fasting for at least 8 hours.  Glucose, capillary     Status: Abnormal   Collection Time: 09/21/23  4:09 PM  Result Value Ref Range   Glucose-Capillary 101 (H) 70 - 99 mg/dL    Comment: Glucose reference range applies only to samples taken after fasting for at least 8 hours.  Glucose, capillary     Status: Abnormal   Collection Time: 09/21/23  7:23 PM  Result Value Ref Range   Glucose-Capillary 188 (H) 70 - 99 mg/dL    Comment: Glucose reference range applies only to samples taken after fasting for at least 8 hours.  Glucose, capillary     Status: Abnormal   Collection Time: 09/22/23  7:45 AM  Result Value Ref Range   Glucose-Capillary 197 (H) 70 - 99 mg/dL    Comment: Glucose reference range applies only to samples taken after fasting for at least 8 hours.  Glucose, capillary     Status: Abnormal   Collection  Time: 09/22/23 11:31 AM  Result Value Ref Range   Glucose-Capillary 140 (H) 70 - 99 mg/dL    Comment: Glucose reference range applies only to samples taken after fasting for at least 8 hours.    Blood Alcohol level:  Lab Results  Component Value Date   ETH <10 09/12/2023   ETH <10 02/26/2022    Metabolic Disorder Labs: Lab Results  Component Value Date   HGBA1C 7.2 (H) 09/12/2023   MPG 159.94 09/12/2023   MPG 191.51 03/06/2022   Lab Results  Component Value Date   PROLACTIN 23.3 02/20/2017   PROLACTIN 3.6 (L) 07/31/2016   Lab Results  Component Value Date   CHOL 194 03/06/2022   TRIG 113 03/06/2022   HDL 63 03/06/2022   CHOLHDL 3.1 03/06/2022   VLDL 23 03/06/2022   LDLCALC 108 (H) 03/06/2022   LDLCALC 131 (H) 02/20/2017    Physical Findings: AIMS:  , ,  ,  ,    CIWA:    COWS:      Psychiatric Specialty Exam:  Presentation  General Appearance:  Bizarre; Casual  Eye Contact: Minimal  Speech: Blocked  Speech Volume: Decreased    Mood and Affect  Mood: Irritable  Affect: Labile   Thought Process  Thought Processes: Disorganized  Descriptions of Associations:Loose  Orientation:Partial  Thought Content:Illogical; Paranoid Ideation; Delusions  Hallucinations:auditory-chronic, improving  Ideas of Reference:Delusions; Paranoia Improving  Suicidal Thoughts:denies  Homicidal Thoughts:denies   Sensorium  Memory: -- (unable to assess)  Judgment: Impaired  Insight: Shallow   Executive Functions  Concentration: Poor  Attention Span: Poor  Recall: Poor  Fund of Knowledge: Poor  Language: Poor   Psychomotor Activity  slow  Musculoskeletal: Strength & Muscle Tone: within normal limits Gait & Station: normal Assets  Assets: Resilience; Social Support    Physical Exam: Physical Exam Vitals and nursing note reviewed.    Review of Systems  Unable to perform ROS: Psychiatric disorder   Blood pressure  105/64, pulse 100, temperature (!) 96.6 F (35.9 C), resp. rate 18, height 5\' 2"  (1.575 m), weight 71.4 kg, SpO2 98%. Body mass index is 28.81 kg/m.  Diagnosis: Principal Problem:   Schizoaffective disorder, bipolar type (HCC) Active Problems:   Auditory hallucinations  Clinical Decision Making: Patient with history of schizoaffective disorder currently noncompliant with medication, displaying significant amount of disorganized behavior, leaving the house and wandering/driving on the streets aimlessly, endorsing command type auditory hallucinations telling her to kill herself, lacks insight and refusing treatment.  Patient is currently admitted to geropsych unit on IVC for further stabilization.   Treatment Plan Summary:   Safety and Monitoring:             -- Involuntary admission to inpatient psychiatric unit for safety, stabilization and treatment             -- Daily contact with patient to assess and evaluate symptoms and progress in treatment             -- Patient's case to be discussed in multi-disciplinary team meeting             -- Observation Level: q15 minute checks             -- Vital signs:  q12 hours             -- Precautions: suicide, elopement, and assault   2. Psychiatric Diagnoses and Treatment:             Abilify  10 mg BID  Abilify  maintaina 400 mg IM/ on 4 /25/25  Trileptal  300mg  bID for mood stabilization If patient refuses oral medication will initiate enforced medication consult     -- The risks/benefits/side-effects/alternatives to this medication were discussed in detail with the patient and time was given for questions. The patient consents to medication trial.                -- Metabolic profile and EKG monitoring obtained while on an atypical antipsychotic (BMI: Lipid Panel: HbgA1c: QTc:)              -- Encouraged patient to participate in unit milieu and in scheduled group therapies                            3. Medical Issues Being Addressed:   Hospitalist consult is made given her blood sugars more than 550 and her insulin  is adjusted   4. Discharge Planning:              -- Social work and case management to assist with discharge planning and identification of hospital follow-up needs prior to discharge             -- Estimated LOS: 4-5 days             -- Discharge Concerns: Need to establish a safety plan; Medication compliance and effectiveness             -- Discharge Goals: Return home with outpatient referrals follow ups   Physician Treatment Plan  for Primary Diagnosis: Schizoaffective disorder, bipolar type (HCC) Long Term Goal(s): Improvement in symptoms so as ready for discharge   Short Term Goals: Ability to identify changes in lifestyle to reduce recurrence of condition will improve, Ability to verbalize feelings will improve, Ability to disclose and discuss suicidal ideas, Ability to demonstrate self-control will improve, and Ability to identify and develop effective coping behaviors will improve   Physician Treatment Plan for Secondary Diagnosis: Principal Problem:   Schizoaffective disorder, bipolar type (HCC) Active Problems:   Auditory hallucinations   Long Term Goal(s): Improvement in symptoms so as ready for discharge   Short Term Goals: Ability to identify changes in lifestyle to reduce recurrence of condition will improve, Ability to verbalize feelings will improve, Ability to disclose and discuss suicidal ideas, Ability to demonstrate self-control will improve, and Ability to identify and develop effective coping behaviors will improve  Rita Blotter, MD 09/22/2023, 4:19 PM

## 2023-09-22 NOTE — Group Note (Signed)
 Date:  09/22/2023 Time:  5:12 PM  Group Topic/Focus:  Activity Group: The focus of the group is to promote activity for the patients and encourage them to go outside in the courtyard and get some fresh air and some exercise.    Participation Level:  Active  Participation Quality:  Appropriate  Affect:  Appropriate  Cognitive:  Appropriate  Insight: Appropriate  Engagement in Group:  Engaged  Modes of Intervention:  Activity  Additional Comments:    Marianna Shirk Yamaira Spinner 09/22/2023, 5:12 PM

## 2023-09-22 NOTE — Progress Notes (Signed)
   09/22/23 1100  Psych Admission Type (Psych Patients Only)  Admission Status Involuntary  Psychosocial Assessment  Patient Complaints None  Eye Contact Fair  Facial Expression Flat  Affect Appropriate to circumstance  Speech Soft  Interaction Minimal  Motor Activity Slow  Appearance/Hygiene In scrubs  Behavior Characteristics Appropriate to situation  Mood Pleasant  Thought Process  Coherency WDL  Content WDL  Delusions None reported or observed  Perception WDL  Hallucination None reported or observed  Judgment Impaired  Confusion Mild  Danger to Self  Current suicidal ideation? Denies  Agreement Not to Harm Self Yes  Description of Agreement verbal

## 2023-09-22 NOTE — Plan of Care (Signed)
   Problem: Education: Goal: Ability to describe self-care measures that may prevent or decrease complications (Diabetes Survival Skills Education) will improve Outcome: Not Progressing Goal: Individualized Educational Video(s) Outcome: Not Progressing

## 2023-09-23 DIAGNOSIS — F250 Schizoaffective disorder, bipolar type: Secondary | ICD-10-CM | POA: Diagnosis not present

## 2023-09-23 LAB — GLUCOSE, CAPILLARY
Glucose-Capillary: 103 mg/dL — ABNORMAL HIGH (ref 70–99)
Glucose-Capillary: 147 mg/dL — ABNORMAL HIGH (ref 70–99)
Glucose-Capillary: 180 mg/dL — ABNORMAL HIGH (ref 70–99)
Glucose-Capillary: 193 mg/dL — ABNORMAL HIGH (ref 70–99)

## 2023-09-23 NOTE — Progress Notes (Signed)
 [facility] MD Progress Note  09/23/2023 8:38 PM Rita Sullivan  MRN:  [id number] Patient is a 68 year old female with history of schizoaffective disorder, bipolar type presented with command auditory hallucinations telling her to kill self. Patient currently admitted to geropsych unit for further stabilization   Subjective:  Chart reviewed, case discussed in multidisciplinary meeting, patient seen during rounds.   Patient is noted to be resting in bed.  She offers no complaints.  She reports that the voices are gone today and even if they come they just talk to each other and does not bother the patient.  She denies visual hallucinations.  She denies feeling depressed or anxious.  She denies SI/HI/plan.  She is willing to transition to outpatient services and provider discussed the plan to discharge her tomorrow.   Sleep: Fair  Appetite:  Fair  Past Psychiatric History: see h&P Family History:  Family History  Problem Relation Age of Onset   Diabetes Father    Diabetes Sister    Social History:  Social History   Substance and Sexual Activity  Alcohol Use Yes   Alcohol/week: 2.0 standard drinks of alcohol   Types: 2 Glasses of wine per week   Comment: Occ     Social History   Substance and Sexual Activity  Drug Use No    Social History   Socioeconomic History   Marital status: Married    Spouse name: Not on file   Number of children: Not on file   Years of education: Not on file   Highest education level: Not on file  Occupational History   Not on file  Tobacco Use   Smoking status: Never   Smokeless tobacco: Never  Vaping Use   Vaping status: Not on file  Substance and Sexual Activity   Alcohol use: Yes    Alcohol/week: 2.0 standard drinks of alcohol    Types: 2 Glasses of wine per week    Comment: Occ   Drug use: No   Sexual activity: Never  Other Topics Concern   Not on file  Social History Narrative   Not on file   Social Drivers of Health   Financial  Resource Strain: Low Risk  (02/23/2023)   Received from [facility]   Overall Financial Resource Strain (CARDIA)    Difficulty of Paying Living Expenses: Not very hard  Food Insecurity: No Food Insecurity (09/14/2023)   Hunger Vital Sign    Worried About Running Out of Food in the Last Year: Never true    Ran Out of Food in the Last Year: Never true  Transportation Needs: No Transportation Needs (09/14/2023)   PRAPARE - Administrator, Civil Service (Medical): No    Lack of Transportation (Non-Medical): No  Physical Activity: Not on file  Stress: Not on file  Social Connections: Socially Isolated (09/14/2023)   Social Connection and Isolation Panel [NHANES]    Frequency of Communication with Friends and Family: Never    Frequency of Social Gatherings with Friends and Family: Never    Attends Religious Services: Never    Database administrator or Organizations: No    Attends Engineer, structural: Never    Marital Status: Married   Past Medical History:  Past Medical History:  Diagnosis Date   Bipolar disorder (HCC)    Chronic kidney disease    Diabetes mellitus without complication (HCC)    Hypercholesteremia    Manic behavior (HCC)     Past  Surgical History:  Procedure Laterality Date   COLONOSCOPY WITH PROPOFOL  N/A 03/11/2015   Procedure: COLONOSCOPY WITH PROPOFOL ;  Surgeon: Stephens Eis, MD;  Location: [facility] ENDOSCOPY;  Service: Gastroenterology;  Laterality: N/A;    Current Medications: Current Facility-Administered Medications  Medication Dose Route Frequency Provider Last Rate Last Admin   acetaminophen  (TYLENOL ) tablet 650 mg  650 mg Oral Q6H PRN Onuoha, Chinwendu V, NP       alum & mag hydroxide-simeth (MAALOX/MYLANTA) 200-200-20 MG/5ML suspension 15 mL  15 mL Oral Q6H PRN Onuoha, Chinwendu V, NP       ARIPiprazole  (ABILIFY ) tablet 10 mg  10 mg Oral BID Elaijah Munoz, MD   10 mg at 09/23/23 1610   glipiZIDE  (GLUCOTROL ) tablet 5 mg  5  mg Oral QAC breakfast Onuoha, Chinwendu V, NP   5 mg at 09/23/23 0824   insulin  aspart (novoLOG ) injection 0-15 Units  0-15 Units Subcutaneous TID WC Onuoha, Chinwendu V, NP   3 Units at 09/23/23 1732   insulin  glargine-yfgn (SEMGLEE ) injection 25 Units  25 Units Subcutaneous BID Djan, Prince T, MD   25 Units at 09/23/23 9604   linagliptin  (TRADJENTA ) tablet 5 mg  5 mg Oral Daily Antoniette Batty T, MD   5 mg at 09/23/23 5409   magnesium  hydroxide (MILK OF MAGNESIA) suspension 15 mL  15 mL Oral Daily PRN Onuoha, Chinwendu V, NP       metFORMIN  (GLUCOPHAGE ) tablet 1,000 mg  1,000 mg Oral BID WC Antoniette Batty T, MD   1,000 mg at 09/23/23 1731   OLANZapine  (ZYPREXA ) injection 5 mg  5 mg Intramuscular TID PRN Onuoha, Chinwendu V, NP       OLANZapine  zydis (ZYPREXA ) disintegrating tablet 5 mg  5 mg Oral TID PRN Onuoha, Chinwendu V, NP   5 mg at 09/19/23 0141   Oxcarbazepine  (TRILEPTAL ) tablet 300 mg  300 mg Oral BID Solyana Nonaka, MD   300 mg at 09/23/23 8119   traZODone  (DESYREL ) tablet 100 mg  100 mg Oral QHS Natayah Warmack, MD   100 mg at 09/22/23 2151    Lab Results:  Results for orders placed or performed during the hospital encounter of 09/14/23 (from the past 48 hours)  Glucose, capillary     Status: Abnormal   Collection Time: 09/22/23  7:45 AM  Result Value Ref Range   Glucose-Capillary 197 (H) 70 - 99 mg/dL    Comment: Glucose reference range applies only to samples taken after fasting for at least 8 hours.  Glucose, capillary     Status: Abnormal   Collection Time: 09/22/23 11:31 AM  Result Value Ref Range   Glucose-Capillary 140 (H) 70 - 99 mg/dL    Comment: Glucose reference range applies only to samples taken after fasting for at least 8 hours.  Glucose, capillary     Status: Abnormal   Collection Time: 09/22/23  4:56 PM  Result Value Ref Range   Glucose-Capillary 107 (H) 70 - 99 mg/dL    Comment: Glucose reference range applies only to samples taken after fasting for at least 8  hours.  Glucose, capillary     Status: Abnormal   Collection Time: 09/22/23  7:22 PM  Result Value Ref Range   Glucose-Capillary 177 (H) 70 - 99 mg/dL    Comment: Glucose reference range applies only to samples taken after fasting for at least 8 hours.  Glucose, capillary     Status: Abnormal   Collection Time: 09/23/23  7:57 AM  Result  Value Ref Range   Glucose-Capillary 103 (H) 70 - 99 mg/dL    Comment: Glucose reference range applies only to samples taken after fasting for at least 8 hours.  Glucose, capillary     Status: Abnormal   Collection Time: 09/23/23 11:55 AM  Result Value Ref Range   Glucose-Capillary 147 (H) 70 - 99 mg/dL    Comment: Glucose reference range applies only to samples taken after fasting for at least 8 hours.  Glucose, capillary     Status: Abnormal   Collection Time: 09/23/23  4:55 PM  Result Value Ref Range   Glucose-Capillary 180 (H) 70 - 99 mg/dL    Comment: Glucose reference range applies only to samples taken after fasting for at least 8 hours.  Glucose, capillary     Status: Abnormal   Collection Time: 09/23/23  7:26 PM  Result Value Ref Range   Glucose-Capillary 193 (H) 70 - 99 mg/dL    Comment: Glucose reference range applies only to samples taken after fasting for at least 8 hours.   Comment 1 Notify RN     Blood Alcohol level:  Lab Results  Component Value Date   ETH <10 09/12/2023   ETH <10 02/26/2022    Metabolic Disorder Labs: Lab Results  Component Value Date   HGBA1C 7.2 (H) 09/12/2023   MPG 159.94 09/12/2023   MPG 191.51 03/06/2022   Lab Results  Component Value Date   PROLACTIN 23.3 02/20/2017   PROLACTIN 3.6 (L) 07/31/2016   Lab Results  Component Value Date   CHOL 194 03/06/2022   TRIG 113 03/06/2022   HDL 63 03/06/2022   CHOLHDL 3.1 03/06/2022   VLDL 23 03/06/2022   LDLCALC 108 (H) 03/06/2022   LDLCALC 131 (H) 02/20/2017    Physical Findings: AIMS:  , ,  ,  ,    CIWA:    COWS:      Psychiatric Specialty  Exam:  Presentation  General Appearance:  Casual, improved Eye Contact: Fair Speech: Normal rate rhythm Speech Volume: Improved   Mood and Affect  Mood: Calm Affect: Bright  Thought Process  Thought Processes: Linear Descriptions of Associations: None reported Orientation: X 3 Thought Content: Improved Hallucinations:auditory-chronic, improving  Ideas of Reference: None  Suicidal Thoughts:denies  Homicidal Thoughts:denies   Sensorium  Memory: Fair Judgment: Impaired  Insight: Shallow   Executive Functions  Concentration: Poor  Attention Span: Improved Recall: Improved Fund of Knowledge: Improved Language: Improved  Psychomotor Activity  slow  Musculoskeletal: Strength & Muscle Tone: within normal limits Gait & Station: normal Assets  Assets: Resilience; Social Support    Physical Exam: Physical Exam Vitals and nursing note reviewed.    Review of Systems  Unable to perform ROS: Psychiatric disorder   Blood pressure 105/78, pulse (!) 108, temperature (!) 97.3 F (36.3 C), resp. rate 20, height 5\' 2"  (1.575 m), weight 71.4 kg, SpO2 100%. Body mass index is 28.81 kg/m.  Diagnosis: Principal Problem:   Schizoaffective disorder, bipolar type (HCC) Active Problems:   Auditory hallucinations  Clinical Decision Making: Patient with history of schizoaffective disorder currently noncompliant with medication, displaying significant amount of disorganized behavior, leaving the house and wandering/driving on the streets aimlessly, endorsing command type auditory hallucinations telling her to kill herself, lacks insight and refusing treatment.  Patient is currently admitted to geropsych unit on IVC for further stabilization.   Treatment Plan Summary:   Safety and Monitoring:             --  Involuntary admission to inpatient psychiatric unit for safety, stabilization and treatment             -- Daily contact with patient to assess and  evaluate symptoms and progress in treatment             -- Patient's case to be discussed in multi-disciplinary team meeting             -- Observation Level: q15 minute checks             -- Vital signs:  q12 hours             -- Precautions: suicide, elopement, and assault   2. Psychiatric Diagnoses and Treatment:             Abilify  10 mg BID  Abilify  maintaina 400 mg IM/ on 4 /25/25  Trileptal  300mg  bID for mood stabilization If patient refuses oral medication will initiate enforced medication consult     -- The risks/benefits/side-effects/alternatives to this medication were discussed in detail with the patient and time was given for questions. The patient consents to medication trial.                -- Metabolic profile and EKG monitoring obtained while on an atypical antipsychotic (BMI: Lipid Panel: HbgA1c: QTc:)              -- Encouraged patient to participate in unit milieu and in scheduled group therapies                            3. Medical Issues Being Addressed:  Hospitalist consult is made given her blood sugars more than 550 and her insulin  is adjusted   4. Discharge Planning:              -- Social work and case management to assist with discharge planning and identification of hospital follow-up needs prior to discharge             -- Estimated LOS: 1-2 days             -- Discharge Concerns: Need to establish a safety plan; Medication compliance and effectiveness             -- Discharge Goals: Return home with outpatient referrals follow ups   Physician Treatment Plan for Primary Diagnosis: Schizoaffective disorder, bipolar type (HCC) Long Term Goal(s): Improvement in symptoms so as ready for discharge   Short Term Goals: Ability to identify changes in lifestyle to reduce recurrence of condition will improve, Ability to verbalize feelings will improve, Ability to disclose and discuss suicidal ideas, Ability to demonstrate self-control will improve, and Ability to  identify and develop effective coping behaviors will improve   Physician Treatment Plan for Secondary Diagnosis: Principal Problem:   Schizoaffective disorder, bipolar type (HCC) Active Problems:   Auditory hallucinations   Long Term Goal(s): Improvement in symptoms so as ready for discharge   Short Term Goals: Ability to identify changes in lifestyle to reduce recurrence of condition will improve, Ability to verbalize feelings will improve, Ability to disclose and discuss suicidal ideas, Ability to demonstrate self-control will improve, and Ability to identify and develop effective coping behaviors will improve  Aurelia Blotter, MD 09/23/2023, 8:38 PM

## 2023-09-23 NOTE — Group Note (Signed)
 Recreation Therapy Group Note   Group Topic:Relaxation  Group Date: 09/23/2023 Start Time: 1100 End Time: 1140 Facilitators: Deatrice Factor, LRT, CTRS Location:  Dayroom  Group Description: PMR (Progressive Muscle Relaxation). LRT educates patients on what PMR is and the benefits that come from it. Patients are asked to sit with their feet flat on the floor while sitting up and all the way back in their chair, if possible. LRT and pts follow a prompt through a speaker that requires you to tense and release different muscles in their body and focus on their breathing. During session, lights are off and soft music is being played. Pts are given a stress ball to use if needed.   Goal Area(s) Addressed:  Patients will be able to describe progressive muscle relaxation.  Patient will practice using relaxation technique. Patient will identify a new coping skill.  Patient will follow multistep directions to reduce anxiety and stress.   Affect/Mood: N/A   Participation Level: Did not attend    Clinical Observations/Individualized Feedback: Patient did not attend group.   Plan: Continue to engage patient in RT group sessions 2-3x/week.   Deatrice Factor, LRT, CTRS 09/23/2023 2:14 PM

## 2023-09-23 NOTE — Progress Notes (Signed)
 Patient is pleasant and cooperative.  Denies SI//HI and AVH.  Denies anxiety and depression.  Denies pain.  Reports she slept well.    Compliant with scheduled medications.  15 min checks in place for safety. Patient is present in the milieu. Appropriate, but minimal interaction with peers and staff.    Plan is for patient to discharge tomorrow.

## 2023-09-23 NOTE — Group Note (Signed)
 Recreation Therapy Group Note   Group Topic:Health and Wellness  Group Date: 09/23/2023 Start Time: 1500 End Time: 1600 Facilitators: Deatrice Factor, LRT, CTRS Location: Courtyard  Group Description: Outdoor Recreation. Patients had the option to play corn hole, ring toss, bowling or listening to music while outside in the courtyard getting fresh air and sunlight. Patients helped water and prune the raised garden beds. LRT and patients discussed things that they enjoy doing in their free time outside of the hospital. LRT encouraged patients to drink water after being active and getting their heart rate up.   Goal Area(s) Addressed: Patient will identify leisure interests.  Patient will practice healthy decision making. Patient will engage in recreation activity.   Affect/Mood: Appropriate   Participation Level: Active   Participation Quality: Independent   Behavior: Appropriate   Speech/Thought Process: Coherent   Insight: Good   Judgement: Good   Modes of Intervention: Activity   Patient Response to Interventions:  Receptive   Education Outcome:  Acknowledges education   Clinical Observations/Individualized Feedback: Paeyton was active in their participation of session activities and group discussion. Pt helped water flowers and plants while outside. Pt interacted well with LRT and peers duration of session.    Plan: Continue to engage patient in RT group sessions 2-3x/week.   Deatrice Factor, LRT, CTRS 09/23/2023 5:10 PM

## 2023-09-23 NOTE — Group Note (Signed)
 Date:  09/23/2023 Time:  5:07 PM  Group Topic/Focus:  Goals Group:   The focus of this group is to help patients establish daily goals to achieve during treatment and discuss how the patient can incorporate goal setting into their daily lives to aide in recovery.    Participation Level:  Active  Participation Quality:  Appropriate  Affect:  Appropriate   Ellan Gunner 09/23/2023, 5:07 PM

## 2023-09-23 NOTE — Progress Notes (Signed)
   09/23/23 0300  Psych Admission Type (Psych Patients Only)  Admission Status Involuntary  Psychosocial Assessment  Patient Complaints None  Eye Contact Fair  Facial Expression Anxious  Affect Appropriate to circumstance  Speech Soft  Interaction Minimal  Motor Activity Slow  Appearance/Hygiene In scrubs  Behavior Characteristics Cooperative;Appropriate to situation  Mood Pleasant  Thought Process  Coherency WDL  Content WDL  Delusions None reported or observed  Perception WDL  Hallucination None reported or observed  Judgment Impaired  Confusion Mild  Danger to Self  Current suicidal ideation? Denies

## 2023-09-23 NOTE — Plan of Care (Signed)
  Problem: Nutritional: Goal: Ability to achieve adequate nutritional intake will improve Outcome: Progressing   Problem: Role Relationship: Goal: Ability to communicate needs accurately will improve Outcome: Progressing   Problem: Self-Care: Goal: Ability to participate in self-care as condition permits will improve Outcome: Progressing

## 2023-09-23 NOTE — Group Note (Signed)
 Date:  09/23/2023 Time:  9:43 PM  Group Topic/Focus:  Goals Group:   The focus of this group is to help patients establish daily goals to achieve during treatment and discuss how the patient can incorporate goal setting into their daily lives to aide in recovery.    Participation Level:  Active  Participation Quality:  Appropriate and Attentive  Affect:  Appropriate  Cognitive:  Alert and Appropriate  Insight: Appropriate and Good  Engagement in Group:  Engaged and Improving  Modes of Intervention:  Discussion, Education, Problem-solving, Rapport Building, and Support  Additional Comments:     Shandrea Lusk 09/23/2023, 9:43 PM

## 2023-09-24 DIAGNOSIS — F250 Schizoaffective disorder, bipolar type: Secondary | ICD-10-CM | POA: Diagnosis not present

## 2023-09-24 LAB — GLUCOSE, CAPILLARY
Glucose-Capillary: 192 mg/dL — ABNORMAL HIGH (ref 70–99)
Glucose-Capillary: 113 mg/dL — ABNORMAL HIGH (ref 70–99)
Glucose-Capillary: 183 mg/dL — ABNORMAL HIGH (ref 70–99)
Glucose-Capillary: 205 mg/dL — ABNORMAL HIGH (ref 70–99)

## 2023-09-24 MED ORDER — BENZTROPINE MESYLATE 1 MG PO TABS: 0.5000 mg | ORAL_TABLET | Freq: Two times a day (BID) | ORAL | Status: DC | PRN

## 2023-09-24 MED ORDER — LOXAPINE SUCCINATE 5 MG PO CAPS
25.0000 mg | ORAL_CAPSULE | Freq: Every day | ORAL | Status: DC
  Administered 2023-09-25 – 2023-09-27 (×3): 25 mg via ORAL
  Filled 2023-09-24 (×3): qty 5

## 2023-09-24 NOTE — Progress Notes (Signed)
   09/24/23 1610  15 Minute Checks  Location Bedroom  Visual Appearance Calm  Behavior Composed  Sleep (Behavioral Health Patients Only)  Calculate sleep? (Click Yes once per 24 hr at 0600 safety check) Yes  Documented sleep last 24 hours 4.75

## 2023-09-24 NOTE — Progress Notes (Signed)
 Patient pleasant with bizarre behavior.   Labile affect.  Irritable. Patient denies SI/HI and AVH.  Denies anxiety and depression.  Denies pain.  Reports she does not remember if she slept well.    RN entered patient room to administer medications.  Patient walked out of her room to nurse's station.  Declined medications initially, but minutes later apologized and took all scheduled medications. Patient has been laying on the floor in her room with pillow and blanket. 15 min checks in place for safety.  Patient present in the milieu sporadically.  Minimal interaction with peers and staff.    Patient discharge postponed.

## 2023-09-24 NOTE — Group Note (Signed)
 Physical/Occupational Therapy Group Note  Group Topic: Functional, Dynamic Balance   Group Date: 09/24/2023 Start Time: 1300 End Time: 1335 Facilitators: Perris Tripathi, Otelia Blew, PT   Group Description: Group discussed impact of balance on safety and independence with functional tasks.  Identified and discussed any self-perceived balance deficits to personalize information.  Discussed and reviewed strategies to address/improve balance deficits: use of assist devices, activity pacing/energy conservation, environment/home safety modifications, focusing attention/minimizing distraction.  Reviewed and participated with standing LE therex designed to target dynamic balance reactions and LE strength/stability; provided handouts with HEP to be utilized outside of group time as appropriate.  Allowed time for questions and further discussion on any balance or mobility concerns/needs.  Therapeutic Goal(s):  Identify and discuss any individual balance deficits and functional implications. Identify and discuss any environmental/home safety modifications that can optimize balance and safety for mobility within the home. Demonstrate understanding and performance of standing therex designed to target dynamic balance deficits.  Individual Participation:  Pt participated actively throughout the session during the activity portions but only minimally during discussion.    Participation Level: Moderate   Participation Quality: Moderate Cues   Behavior: Alert and Appropriate   Speech/Thought Process: Barely audible   Affect/Mood: Flat   Insight: Limited   Judgement: Limited   Modes of Intervention: Activity, Discussion, and Education  Patient Response to Interventions:  Attentive and Engaged   Plan: Continue to engage patient in PT/OT groups 1 - 2x/week.  Lavenia Post PT, DPT 09/24/23, 2:25 PM

## 2023-09-24 NOTE — Progress Notes (Signed)
   09/23/23 2100  Psych Admission Type (Psych Patients Only)  Admission Status Voluntary  Psychosocial Assessment  Patient Complaints None  Eye Contact Fair  Facial Expression Animated  Affect Anxious  Speech Soft  Interaction Minimal  Motor Activity Slow  Appearance/Hygiene In scrubs  Behavior Characteristics Appropriate to situation  Mood Labile  Aggressive Behavior  Effect No apparent injury  Thought Process  Coherency WDL  Content WDL  Delusions None reported or observed  Perception WDL  Hallucination None reported or observed  Judgment Impaired  Confusion Mild  Danger to Self  Current suicidal ideation? Denies  Agreement Not to Harm Self Yes  Description of Agreement Verbal  Danger to Others  Danger to Others None reported or observed

## 2023-09-24 NOTE — Plan of Care (Signed)
  Problem: Nutritional: Goal: Ability to achieve adequate nutritional intake will improve Outcome: Progressing   Problem: Education: Goal: Will be free of psychotic symptoms Outcome: Not Progressing   Problem: Self-Care: Goal: Ability to participate in self-care as condition permits will improve Outcome: Not Progressing

## 2023-09-24 NOTE — Plan of Care (Signed)
  Problem: Education: Goal: Ability to describe self-care measures that may prevent or decrease complications (Diabetes Survival Skills Education) will improve 09/24/2023 0705 by Jasper Messenger, RN Outcome: Progressing 09/24/2023 0540 by Jasper Messenger, RN Outcome: Progressing Goal: Individualized Educational Video(s) 09/24/2023 0705 by Jasper Messenger, RN Outcome: Progressing 09/24/2023 0540 by Jasper Messenger, RN Outcome: Progressing   Problem: Coping: Goal: Ability to adjust to condition or change in health will improve 09/24/2023 0705 by Jasper Messenger, RN Outcome: Progressing 09/24/2023 0540 by Jasper Messenger, RN Outcome: Progressing

## 2023-09-24 NOTE — Group Note (Signed)
 Recreation Therapy Group Note   Group Topic:Leisure Education  Group Date: 09/24/2023 Start Time: 1345 End Time: 1445 Facilitators: Deatrice Factor, LRT, CTRS  Location: Courtyard  Group Description: Music. Patients encouraged to name their favorite song(s) for LRT to play song through speaker for group to hear. Patient educated on the definition of leisure and the importance of having different leisure interests outside of the hospital. Group discussed how leisure activities can often be used as Pharmacologist and that listening to music is one example. Patients also assisted in watering the raised garden beds in the courtyard.   Goal Area(s) Addressed:  Patient will identify a current leisure interest.  Patient will practice making a positive decision. Patient will have the opportunity to try a new leisure activity.  Affect/Mood: Appropriate   Participation Level: Moderate   Participation Quality: Independent   Behavior: Calm   Speech/Thought Process: Coherent   Insight: Fair   Judgement: Fair    Modes of Intervention: Exploration, Music, Rapport Building, and Socialization   Patient Response to Interventions:  Receptive   Education Outcome:  Acknowledges education   Clinical Observations/Individualized Feedback: Rita Sullivan was mostly active in their participation of session activities and group discussion. Pt helped LRT water the flowers outside. Pt shared that she likes "being in nature". Pt interacted well with LRT and peers duration of session.    Plan: Continue to engage patient in RT group sessions 2-3x/week.   Deatrice Factor, LRT, CTRS 09/24/2023 3:27 PM

## 2023-09-24 NOTE — BHH Counselor (Signed)
 CSW assisted pt to use the phone to call her husband to ask for a ride.   Pt able to make phone call without incident.   Derrill Flirt, MSW, [place] 09/28/2023 4:42 PM

## 2023-09-24 NOTE — Progress Notes (Signed)
  [facility] Case Management Discharge Plan :  Will you be returning to the same living situation after discharge:  Yes,  pt will return home  At discharge, do you have transportation home?: Yes,  pt's husband will pick her up  Do you have the ability to pay for your medications: Yes,  AETNA MEDICARE / AETNA MEDICARE HMO/PPO  Release of information consent forms completed and in the chart;  Patient's signature needed at discharge.  Patient to Follow up at:  Follow-up Information     [facility] Follow up on 10/12/2023.   Why: Your appointment is scheduled for 10/12/23 at 10:30 AM with Michaelyn Adu at [facility]. Contact information: [address] [place] [address] [phone]                 Next level of care provider has access to [facility] Link:no  Safety Planning and Suicide Prevention discussed: Yes,  CSW went over SPE brochure with pt      Has patient been referred to the Quitline?: Patient does not use tobacco/nicotine products  Patient has been referred for addiction treatment: No known substance use disorder.  [address], LCSWA 09/24/2023, 10:15 AM

## 2023-09-24 NOTE — Progress Notes (Signed)
 [facility] MD Progress Note  09/24/2023 6:25 PM Rita Sullivan  MRN:  [id number] Patient is a 68 year old female with history of schizoaffective disorder, bipolar type presented with command auditory hallucinations telling her to kill self. Patient currently admitted to geropsych unit for further stabilization   Subjective:  Chart reviewed, case discussed in multidisciplinary meeting, patient seen during rounds.  Patient is noted to be sitting in chair.  She offers no complaints.  She continues to report that the voices are slow currently and not bothering her.  She denies SI/HI/plan.  Nursing expressed her concern that patient is noted to be more paranoid today and refused her medication including insulin .  Nursing has to do a lot of prompting and after 2-3 attempts she took her insulin .  Provider called her husband and spoke at length.  Husband expressed her fear that patient continues to be soundly paranoid and inform the provider that she told the husband that she is going to run away when she is discharged home as she needs to go somewhere.  Husband discussed with previous history of patient being a missing person report multiple times and being found at random places as psychosis takes over and she leaves the house.  Husband has not visited the patient but over the phone she spoke only 2 minutes and nothing specific.  Provider and husband discussed initiation of second antipsychotic to see if it improves the symptoms.  Her discharge today has been canceled and will continue to work on optimizing her symptoms.    Sleep: Fair  Appetite:  Fair  Past Psychiatric History: see h&P Family History:  Family History  Problem Relation Age of Onset   Diabetes Father    Diabetes Sister    Social History:  Social History   Substance and Sexual Activity  Alcohol Use Yes   Alcohol/week: 2.0 standard drinks of alcohol   Types: 2 Glasses of wine per week   Comment: Occ     Social History   Substance  and Sexual Activity  Drug Use No    Social History   Socioeconomic History   Marital status: Married    Spouse name: Not on file   Number of children: Not on file   Years of education: Not on file   Highest education level: Not on file  Occupational History   Not on file  Tobacco Use   Smoking status: Never   Smokeless tobacco: Never  Vaping Use   Vaping status: Not on file  Substance and Sexual Activity   Alcohol use: Yes    Alcohol/week: 2.0 standard drinks of alcohol    Types: 2 Glasses of wine per week    Comment: Occ   Drug use: No   Sexual activity: Never  Other Topics Concern   Not on file  Social History Narrative   Not on file   Social Drivers of Health   Financial Resource Strain: Low Risk  (02/23/2023)   Received from [facility]   Overall Financial Resource Strain (CARDIA)    Difficulty of Paying Living Expenses: Not very hard  Food Insecurity: No Food Insecurity (09/14/2023)   Hunger Vital Sign    Worried About Running Out of Food in the Last Year: Never true    Ran Out of Food in the Last Year: Never true  Transportation Needs: No Transportation Needs (09/14/2023)   PRAPARE - Administrator, Civil Service (Medical): No    Lack of Transportation (Non-Medical): No  Physical Activity: Not on file  Stress: Not on file  Social Connections: Socially Isolated (09/14/2023)   Social Connection and Isolation Panel [NHANES]    Frequency of Communication with Friends and Family: Never    Frequency of Social Gatherings with Friends and Family: Never    Attends Religious Services: Never    Database administrator or Organizations: No    Attends Engineer, structural: Never    Marital Status: Married   Past Medical History:  Past Medical History:  Diagnosis Date   Bipolar disorder (HCC)    Chronic kidney disease    Diabetes mellitus without complication (HCC)    Hypercholesteremia    Manic behavior (HCC)     Past Surgical  History:  Procedure Laterality Date   COLONOSCOPY WITH PROPOFOL  N/A 03/11/2015   Procedure: COLONOSCOPY WITH PROPOFOL ;  Surgeon: Stephens Eis, MD;  Location: [facility] ENDOSCOPY;  Service: Gastroenterology;  Laterality: N/A;    Current Medications: Current Facility-Administered Medications  Medication Dose Route Frequency Provider Last Rate Last Admin   acetaminophen  (TYLENOL ) tablet 650 mg  650 mg Oral Q6H PRN Onuoha, Chinwendu V, NP       alum & mag hydroxide-simeth (MAALOX/MYLANTA) 200-200-20 MG/5ML suspension 15 mL  15 mL Oral Q6H PRN Onuoha, Chinwendu V, NP       ARIPiprazole  (ABILIFY ) tablet 10 mg  10 mg Oral BID Misao Fackrell, MD   10 mg at 09/24/23 0843   glipiZIDE  (GLUCOTROL ) tablet 5 mg  5 mg Oral QAC breakfast Onuoha, Chinwendu V, NP   5 mg at 09/24/23 0843   insulin  aspart (novoLOG ) injection 0-15 Units  0-15 Units Subcutaneous TID WC Onuoha, Chinwendu V, NP   3 Units at 09/24/23 0844   insulin  glargine-yfgn (SEMGLEE ) injection 25 Units  25 Units Subcutaneous BID Djan, Prince T, MD   25 Units at 09/24/23 1610   linagliptin  (TRADJENTA ) tablet 5 mg  5 mg Oral Daily Antoniette Batty T, MD   5 mg at 09/24/23 9604   magnesium  hydroxide (MILK OF MAGNESIA) suspension 15 mL  15 mL Oral Daily PRN Onuoha, Chinwendu V, NP       metFORMIN  (GLUCOPHAGE ) tablet 1,000 mg  1,000 mg Oral BID WC Antoniette Batty T, MD   1,000 mg at 09/24/23 1757   OLANZapine  (ZYPREXA ) injection 5 mg  5 mg Intramuscular TID PRN Onuoha, Chinwendu V, NP       OLANZapine  zydis (ZYPREXA ) disintegrating tablet 5 mg  5 mg Oral TID PRN Onuoha, Chinwendu V, NP   5 mg at 09/19/23 0141   Oxcarbazepine  (TRILEPTAL ) tablet 300 mg  300 mg Oral BID Chonte Ricke, MD   300 mg at 09/24/23 5409   traZODone  (DESYREL ) tablet 100 mg  100 mg Oral QHS Anneka Studer, MD   100 mg at 09/23/23 2100    Lab Results:  Results for orders placed or performed during the hospital encounter of 09/14/23 (from the past 48 hours)  Glucose, capillary     Status:  Abnormal   Collection Time: 09/22/23  7:22 PM  Result Value Ref Range   Glucose-Capillary 177 (H) 70 - 99 mg/dL    Comment: Glucose reference range applies only to samples taken after fasting for at least 8 hours.  Glucose, capillary     Status: Abnormal   Collection Time: 09/23/23  7:57 AM  Result Value Ref Range   Glucose-Capillary 103 (H) 70 - 99 mg/dL    Comment: Glucose reference range applies only to samples taken  after fasting for at least 8 hours.  Glucose, capillary     Status: Abnormal   Collection Time: 09/23/23 11:55 AM  Result Value Ref Range   Glucose-Capillary 147 (H) 70 - 99 mg/dL    Comment: Glucose reference range applies only to samples taken after fasting for at least 8 hours.  Glucose, capillary     Status: Abnormal   Collection Time: 09/23/23  4:55 PM  Result Value Ref Range   Glucose-Capillary 180 (H) 70 - 99 mg/dL    Comment: Glucose reference range applies only to samples taken after fasting for at least 8 hours.  Glucose, capillary     Status: Abnormal   Collection Time: 09/23/23  7:26 PM  Result Value Ref Range   Glucose-Capillary 193 (H) 70 - 99 mg/dL    Comment: Glucose reference range applies only to samples taken after fasting for at least 8 hours.   Comment 1 Notify RN   Glucose, capillary     Status: Abnormal   Collection Time: 09/24/23  7:49 AM  Result Value Ref Range   Glucose-Capillary 192 (H) 70 - 99 mg/dL    Comment: Glucose reference range applies only to samples taken after fasting for at least 8 hours.  Glucose, capillary     Status: Abnormal   Collection Time: 09/24/23 12:01 PM  Result Value Ref Range   Glucose-Capillary 113 (H) 70 - 99 mg/dL    Comment: Glucose reference range applies only to samples taken after fasting for at least 8 hours.  Glucose, capillary     Status: Abnormal   Collection Time: 09/24/23  4:33 PM  Result Value Ref Range   Glucose-Capillary 183 (H) 70 - 99 mg/dL    Comment: Glucose reference range applies only to  samples taken after fasting for at least 8 hours.    Blood Alcohol level:  Lab Results  Component Value Date   ETH <10 09/12/2023   ETH <10 02/26/2022    Metabolic Disorder Labs: Lab Results  Component Value Date   HGBA1C 7.2 (H) 09/12/2023   MPG 159.94 09/12/2023   MPG 191.51 03/06/2022   Lab Results  Component Value Date   PROLACTIN 23.3 02/20/2017   PROLACTIN 3.6 (L) 07/31/2016   Lab Results  Component Value Date   CHOL 194 03/06/2022   TRIG 113 03/06/2022   HDL 63 03/06/2022   CHOLHDL 3.1 03/06/2022   VLDL 23 03/06/2022   LDLCALC 108 (H) 03/06/2022   LDLCALC 131 (H) 02/20/2017    Physical Findings: AIMS:  , ,  ,  ,    CIWA:    COWS:      Psychiatric Specialty Exam:  Presentation  General Appearance:  Casual, improved Eye Contact: Fair Speech: Normal rate rhythm Speech Volume: Improved   Mood and Affect  Mood:fine Affect: flat  Thought Process  Thought Processes:scattered, impoverished Descriptions of Associations: None reported Orientation: X 3 Thought Content: Impoverished, delusions,paranoia Hallucinations:auditory-chronic, improving  Ideas of Reference: None  Suicidal Thoughts:denies  Homicidal Thoughts:denies   Sensorium  Memory: Fair Judgment: Impaired  Insight: Shallow   Executive Functions  Concentration: Poor  Attention Span: Improved Recall: Improved Fund of Knowledge: Improved Language: Improved  Psychomotor Activity  slow  Musculoskeletal: Strength & Muscle Tone: within normal limits Gait & Station: normal Assets  Assets: Resilience; Social Support    Physical Exam: Physical Exam Vitals and nursing note reviewed.    Review of Systems  Unable to perform ROS: Psychiatric disorder   Blood pressure 114/89,  pulse (!) 112, temperature (!) 96.6 F (35.9 C), resp. rate 20, height 5\' 2"  (1.575 m), weight 71.4 kg, SpO2 100%. Body mass index is 28.81 kg/m.  Diagnosis: Principal Problem:    Schizoaffective disorder, bipolar type (HCC) Active Problems:   Auditory hallucinations  Clinical Decision Making: Patient with history of schizoaffective disorder currently noncompliant with medication, displaying significant amount of disorganized behavior, leaving the house and wandering/driving on the streets aimlessly, endorsing command type auditory hallucinations telling her to kill herself, lacks insight and refusing treatment.  Patient is currently admitted to geropsych unit on IVC for further stabilization.   Treatment Plan Summary:   Safety and Monitoring:             -- Involuntary admission to inpatient psychiatric unit for safety, stabilization and treatment             -- Daily contact with patient to assess and evaluate symptoms and progress in treatment             -- Patient's case to be discussed in multi-disciplinary team meeting             -- Observation Level: q15 minute checks             -- Vital signs:  q12 hours             -- Precautions: suicide, elopement, and assault   2. Psychiatric Diagnoses and Treatment:  Will consider second antipsychotic to help with residual psychotic sx that are impairing her function- Loxapine 25 mg daily            Abilify  10 mg BID  Abilify  maintaina 400 mg IM/ on 4 /25/25  Trileptal  300mg  bID for mood stabilization If patient refuses oral medication will initiate enforced medication consult     -- The risks/benefits/side-effects/alternatives to this medication were discussed in detail with the patient and time was given for questions. The patient consents to medication trial.                -- Metabolic profile and EKG monitoring obtained while on an atypical antipsychotic (BMI: Lipid Panel: HbgA1c: QTc:)              -- Encouraged patient to participate in unit milieu and in scheduled group therapies                            3. Medical Issues Being Addressed:  Hospitalist consult is made given her blood sugars more than 550  and her insulin  is adjusted   4. Discharge Planning:              -- Social work and case management to assist with discharge planning and identification of hospital follow-up needs prior to discharge             -- Estimated LOS: 1-2 days             -- Discharge Concerns: Need to establish a safety plan; Medication compliance and effectiveness             -- Discharge Goals: Return home with outpatient referrals follow ups   Physician Treatment Plan for Primary Diagnosis: Schizoaffective disorder, bipolar type (HCC) Long Term Goal(s): Improvement in symptoms so as ready for discharge   Short Term Goals: Ability to identify changes in lifestyle to reduce recurrence of condition will improve, Ability to verbalize feelings will improve, Ability to disclose and discuss suicidal  ideas, Ability to demonstrate self-control will improve, and Ability to identify and develop effective coping behaviors will improve   Physician Treatment Plan for Secondary Diagnosis: Principal Problem:   Schizoaffective disorder, bipolar type (HCC) Active Problems:   Auditory hallucinations   Long Term Goal(s): Improvement in symptoms so as ready for discharge   Short Term Goals: Ability to identify changes in lifestyle to reduce recurrence of condition will improve, Ability to verbalize feelings will improve, Ability to disclose and discuss suicidal ideas, Ability to demonstrate self-control will improve, and Ability to identify and develop effective coping behaviors will improve  Aurelia Blotter, MD 09/24/2023, 6:25 PM

## 2023-09-24 NOTE — Group Note (Signed)
 Date:  09/24/2023 Time:  11:02 AM  Group Topic/Focus:  Outdoor Rec.     Participation Level:  Did Not Attend   Merton Abts 09/24/2023, 11:02 AM

## 2023-09-24 NOTE — Group Note (Signed)
 Date:  09/24/2023 Time:  10:53 PM  Group Topic/Focus:  Wrap-Up Group:   The focus of this group is to help patients review their daily goal of treatment and discuss progress on daily workbooks.    Participation Level:  Did Not Attend  Participation Quality:      Affect:      Cognitive:      Insight: None  Engagement in Group:  None  Modes of Intervention:      Additional Comments:    Rolland Cline 09/24/2023, 10:53 PM

## 2023-09-25 DIAGNOSIS — F250 Schizoaffective disorder, bipolar type: Secondary | ICD-10-CM | POA: Diagnosis not present

## 2023-09-25 LAB — GLUCOSE, CAPILLARY
Glucose-Capillary: 90 mg/dL (ref 70–99)
Glucose-Capillary: 73 mg/dL (ref 70–99)
Glucose-Capillary: 133 mg/dL — ABNORMAL HIGH (ref 70–99)
Glucose-Capillary: 192 mg/dL — ABNORMAL HIGH (ref 70–99)
Glucose-Capillary: 67 mg/dL — ABNORMAL LOW (ref 70–99)
Glucose-Capillary: 73 mg/dL (ref 70–99)
Glucose-Capillary: 159 mg/dL — ABNORMAL HIGH (ref 70–99)

## 2023-09-25 NOTE — Plan of Care (Signed)
  Problem: Health Behavior/Discharge Planning: Goal: Ability to identify and utilize available resources and services will improve Outcome: Progressing Goal: Ability to manage health-related needs will improve Outcome: Progressing

## 2023-09-25 NOTE — Group Note (Signed)
 Date:  09/25/2023 Time:  11:28 PM  Group Topic/Focus:  Goals Group:   The focus of this group is to help patients establish daily goals to achieve during treatment and discuss how the patient can incorporate goal setting into their daily lives to aide in recovery.    Participation Level:  Active  Participation Quality:  Appropriate  Affect:  Appropriate  Cognitive:  Appropriate  Insight: Good  Engagement in Group:  Engaged  Modes of Intervention:  Discussion  Additional Comments:    Lynette Saras 09/25/2023, 11:28 PM

## 2023-09-25 NOTE — Progress Notes (Signed)
   09/25/23 1400  Psych Admission Type (Psych Patients Only)  Admission Status Voluntary  Psychosocial Assessment  Patient Complaints None  Eye Contact Fair  Facial Expression Flat  Affect Appropriate to circumstance  Speech Logical/coherent  Interaction Assertive  Motor Activity Slow  Appearance/Hygiene In scrubs  Behavior Characteristics Cooperative  Mood Preoccupied  Thought Process  Coherency WDL  Content WDL  Delusions None reported or observed  Perception WDL  Hallucination None reported or observed  Judgment Impaired  Confusion Mild  Danger to Self  Current suicidal ideation? Denies  Agreement Not to Harm Self Yes  Description of Agreement verbal

## 2023-09-25 NOTE — Group Note (Signed)
 [facility] LCSW Group Therapy Note   Group Date: 09/25/2023 Start Time: 1320 End Time: 1420   Type of Therapy/Topic:  Group Therapy:  Emotion Regulation  Participation Level:  Active   Mood:   Euthymic  Description of Group:    The purpose of this group is to assist patients in learning to regulate negative emotions and experience positive emotions. Patients will be guided to discuss ways in which they have been vulnerable to their negative emotions. These vulnerabilities will be juxtaposed with experiences of positive emotions or situations, and patients challenged to use positive emotions to combat negative ones. Special emphasis will be placed on coping with negative emotions in conflict situations, and patients will process healthy conflict resolution skills.  Therapeutic Goals: Patient will identify two positive emotions or experiences to reflect on in order to balance out negative emotions:  Patient will label two or more emotions that they find the most difficult to experience:  Patient will be able to demonstrate positive conflict resolution skills through discussion or role plays:   Summary of Patient Progress:   Patient was alert and active in group. Patient was able to describe the way different emotions determine how we feel.     Therapeutic Modalities:   Cognitive Behavioral Therapy Feelings Identification Dialectical Behavioral Therapy   Darris Emery Vlada Uriostegui, LCSW

## 2023-09-25 NOTE — Progress Notes (Signed)
   09/24/23 2200  Psych Admission Type (Psych Patients Only)  Admission Status Voluntary  Psychosocial Assessment  Patient Complaints None  Eye Contact Fair  Facial Expression Flat  Affect Appropriate to circumstance  Speech Soft  Interaction Assertive  Motor Activity Slow  Appearance/Hygiene In scrubs  Behavior Characteristics Appropriate to situation  Mood Pleasant  Thought Process  Coherency WDL  Content WDL  Delusions None reported or observed  Perception WDL  Hallucination None reported or observed  Judgment Impaired  Confusion Mild  Danger to Self  Current suicidal ideation? Denies   Pt is alert and oriented. Upon approach states she's okay. Denies anxiety and depression. Isolates in room. Min interaction with staff and peers. Po med given as scheduled. Denies SI/HI/AVH. No c/o pain/discomfort noted.

## 2023-09-25 NOTE — Plan of Care (Signed)
  Problem: Activity: Goal: Will verbalize the importance of balancing activity with adequate rest periods Outcome: Progressing   Problem: Education: Goal: Will be free of psychotic symptoms Outcome: Progressing Goal: Knowledge of the prescribed therapeutic regimen will improve Outcome: Progressing   Problem: Coping: Goal: Coping ability will improve Outcome: Progressing   Problem: Nutritional: Goal: Ability to achieve adequate nutritional intake will improve Outcome: Progressing   Problem: Self-Care: Goal: Ability to participate in self-care as condition permits will improve Outcome: Progressing

## 2023-09-25 NOTE — Group Note (Signed)
 Date:  09/25/2023 Time:  11:27 AM  Group Topic/Focus:  Building Self Esteem:   The Focus of this group is helping patients become aware of the effects of self-esteem on their lives, the things they and others do that enhance or undermine their self-esteem, seeing the relationship between their level of self-esteem and the choices they make and learning ways to enhance self-esteem. Goals Group:   The focus of this group is to help patients establish daily goals to achieve during treatment and discuss how the patient can incorporate goal setting into their daily lives to aide in recovery. Making Healthy Choices:   The focus of this group is to help patients identify negative/unhealthy choices they were using prior to admission and identify positive/healthier coping strategies to replace them upon discharge. Self Care:   The focus of this group is to help patients understand the importance of self-care in order to improve or restore emotional, physical, spiritual, interpersonal, and financial health.    Participation Level:  Active  Participation Quality:  Appropriate  Affect:  Appropriate  Cognitive:  Appropriate  Insight: Appropriate  Engagement in Group:  Engaged  Modes of Intervention:  Activity and Discussion  Additional Comments:    Keyanni Whittinghill L Anyla Israelson 09/25/2023, 11:27 AM

## 2023-09-25 NOTE — BH IP Treatment Plan (Unsigned)
 Interdisciplinary Treatment and Diagnostic Plan Update  09/25/2023 Time of Session: 10:25am Zayla Buxbaum MRN: [id number]  Principal Diagnosis: Schizoaffective disorder, bipolar type [facility])  Secondary Diagnoses: Principal Problem:   Schizoaffective disorder, bipolar type (HCC) Active Problems:   Auditory hallucinations   Current Medications:  Current Facility-Administered Medications  Medication Dose Route Frequency Provider Last Rate Last Admin   acetaminophen  (TYLENOL ) tablet 650 mg  650 mg Oral Q6H PRN Onuoha, Chinwendu V, NP       alum & mag hydroxide-simeth (MAALOX/MYLANTA) 200-200-20 MG/5ML suspension 15 mL  15 mL Oral Q6H PRN Onuoha, Chinwendu V, NP       ARIPiprazole  (ABILIFY ) tablet 10 mg  10 mg Oral BID Jadapalle, Sree, MD   10 mg at 09/24/23 2117   benztropine  (COGENTIN ) tablet 0.5 mg  0.5 mg Oral BID PRN Jadapalle, Sree, MD       glipiZIDE  (GLUCOTROL ) tablet 5 mg  5 mg Oral QAC breakfast Onuoha, Chinwendu V, NP   5 mg at 09/25/23 0809   insulin  aspart (novoLOG ) injection 0-15 Units  0-15 Units Subcutaneous TID WC Onuoha, Chinwendu V, NP   3 Units at 09/24/23 0844   insulin  glargine-yfgn (SEMGLEE ) injection 25 Units  25 Units Subcutaneous BID Djan, Prince T, MD   25 Units at 09/24/23 2118   linagliptin  (TRADJENTA ) tablet 5 mg  5 mg Oral Daily Antoniette Batty T, MD   5 mg at 09/24/23 4098   loxapine (LOXITANE) capsule 25 mg  25 mg Oral Daily Jadapalle, Sree, MD       magnesium  hydroxide (MILK OF MAGNESIA) suspension 15 mL  15 mL Oral Daily PRN Onuoha, Chinwendu V, NP       metFORMIN  (GLUCOPHAGE ) tablet 1,000 mg  1,000 mg Oral BID WC Zhang, Ping T, MD   1,000 mg at 09/25/23 0809   OLANZapine  (ZYPREXA ) injection 5 mg  5 mg Intramuscular TID PRN Onuoha, Chinwendu V, NP       OLANZapine  zydis (ZYPREXA ) disintegrating tablet 5 mg  5 mg Oral TID PRN Onuoha, Chinwendu V, NP   5 mg at 09/19/23 0141   Oxcarbazepine  (TRILEPTAL ) tablet 300 mg  300 mg Oral BID Jadapalle, Sree, MD   300 mg at  09/24/23 2117   traZODone  (DESYREL ) tablet 100 mg  100 mg Oral QHS Jadapalle, Sree, MD   100 mg at 09/24/23 2117   PTA Medications: Medications Prior to Admission  Medication Sig Dispense Refill Last Dose/Taking   ARIPiprazole  (ABILIFY ) 20 MG tablet Take 1 tablet (20 mg total) by mouth every morning. 30 tablet 1 Unknown   atorvastatin  (LIPITOR) 40 MG tablet Take 1 tablet (40 mg total) by mouth daily. 30 tablet 1 Unknown   carbamazepine  (TEGRETOL ) 200 MG tablet Take 2 tablets (400 mg total) by mouth 3 (three) times daily. 90 tablet 1 Unknown   cholecalciferol  (VITAMIN D3) 25 MCG (1000 UNIT) tablet Take 2 tablets (2,000 Units total) by mouth daily. (Patient not taking: Reported on 09/13/2023) 60 tablet 1 Unknown   folic acid  (FOLVITE ) 1 MG tablet Take 0.5 tablets (0.5 mg total) by mouth daily. (Patient not taking: Reported on 09/13/2023) 30 tablet 1 Unknown   glipiZIDE  (GLUCOTROL ) 5 MG tablet Take 1 tablet (5 mg total) by mouth daily with breakfast. 30 tablet 1 Unknown   insulin  glargine-yfgn (SEMGLEE ) 100 UNIT/ML injection Inject 0.1 mLs (10 Units total) into the skin at bedtime. (Patient not taking: Reported on 09/13/2023) 10 mL 1 Unknown   JANUVIA 50 MG tablet Take 50 mg  by mouth daily.   Unknown   losartan  (COZAAR ) 25 MG tablet Take 1 tablet (25 mg total) by mouth daily. 30 tablet 1 Unknown   metFORMIN  (GLUCOPHAGE ) 500 MG tablet Take 1,000 mg by mouth 2 (two) times daily with a meal.   Unknown   traZODone  (DESYREL ) 100 MG tablet Take 1 tablet (100 mg total) by mouth at bedtime. 30 tablet 1 Unknown    Patient Stressors: Medication change or noncompliance    Patient Strengths: Average or above average intelligence  Communication skills  Supportive family/friends   Treatment Modalities: Medication Management, Group therapy, Case management,  1 to 1 session with clinician, Psychoeducation, Recreational therapy.   Physician Treatment Plan for Primary Diagnosis: Schizoaffective disorder,  bipolar type (HCC) Long Term Goal(s): Improvement in symptoms so as ready for discharge   Short Term Goals: Ability to identify changes in lifestyle to reduce recurrence of condition will improve Ability to verbalize feelings will improve Ability to disclose and discuss suicidal ideas Ability to demonstrate self-control will improve Ability to identify and develop effective coping behaviors will improve  Medication Management: Evaluate patient's response, side effects, and tolerance of medication regimen.  Therapeutic Interventions: 1 to 1 sessions, Unit Group sessions and Medication administration.  Evaluation of Outcomes: Progressing  Physician Treatment Plan for Secondary Diagnosis: Principal Problem:   Schizoaffective disorder, bipolar type (HCC) Active Problems:   Auditory hallucinations  Long Term Goal(s): Improvement in symptoms so as ready for discharge   Short Term Goals: Ability to identify changes in lifestyle to reduce recurrence of condition will improve Ability to verbalize feelings will improve Ability to disclose and discuss suicidal ideas Ability to demonstrate self-control will improve Ability to identify and develop effective coping behaviors will improve     Medication Management: Evaluate patient's response, side effects, and tolerance of medication regimen.  Therapeutic Interventions: 1 to 1 sessions, Unit Group sessions and Medication administration.  Evaluation of Outcomes: Progressing   RN Treatment Plan for Primary Diagnosis: Schizoaffective disorder, bipolar type (HCC) Long Term Goal(s): Knowledge of disease and therapeutic regimen to maintain health will improve  Short Term Goals: Ability to remain free from injury will improve, Ability to verbalize frustration and anger appropriately will improve, Ability to demonstrate self-control, Ability to participate in decision making will improve, Ability to verbalize feelings will improve, Ability to  disclose and discuss suicidal ideas, Ability to identify and develop effective coping behaviors will improve, and Compliance with prescribed medications will improve  Medication Management: RN will administer medications as ordered by provider, will assess and evaluate patient's response and provide education to patient for prescribed medication. RN will report any adverse and/or side effects to prescribing provider.  Therapeutic Interventions: 1 on 1 counseling sessions, Psychoeducation, Medication administration, Evaluate responses to treatment, Monitor vital signs and CBGs as ordered, Perform/monitor CIWA, COWS, AIMS and Fall Risk screenings as ordered, Perform wound care treatments as ordered.  Evaluation of Outcomes: Progressing   LCSW Treatment Plan for Primary Diagnosis: Schizoaffective disorder, bipolar type (HCC) Long Term Goal(s): Safe transition to appropriate next level of care at discharge, Engage patient in therapeutic group addressing interpersonal concerns.  Short Term Goals: Engage patient in aftercare planning with referrals and resources, Increase social support, Increase ability to appropriately verbalize feelings, Increase emotional regulation, Facilitate acceptance of mental health diagnosis and concerns, Facilitate patient progression through stages of change regarding substance use diagnoses and concerns, Identify triggers associated with mental health/substance abuse issues, and Increase skills for wellness and recovery  Therapeutic Interventions:  Assess for all discharge needs, 1 to 1 time with Social worker, Explore available resources and support systems, Assess for adequacy in community support network, Educate family and significant other(s) on suicide prevention, Complete Psychosocial Assessment, Interpersonal group therapy.  Evaluation of Outcomes: Progressing   Progress in Treatment: Attending groups: Yes. Participating in groups: Yes. Taking medication as  prescribed: Yes. Toleration medication: Yes. Family/Significant other contact made: No, will contact:  Pt declined consent Patient understands diagnosis: Yes. Discussing patient identified problems/goals with staff: Yes. Medical problems stabilized or resolved: Yes. Denies suicidal/homicidal ideation: Yes. Issues/concerns per patient self-inventory: Yes. Other:   New problem(s) identified: No, Describe:  None identified Update 09/20/23: No changes at this time. Update: 09/25/2023 no changes at this time.   New Short Term/Long Term Goal(s):  elimination of symptoms of psychosis, medication management for mood stabilization; elimination of SI thoughts; development of comprehensive mental wellness plan. Update 09/20/23: No changes at this time. Update: 09/25/2023 no changes at this time.   Patient Goals:  "I'm not really sick, I don't really need to be here. I'm not a psychiatric patient in this hospital am I? Update 09/20/23: Pt received LAI and is interacting more appropriately with peers and staff on the unit. Update: 09/25/2023 no changes at this time.   Discharge Plan or Barriers: CSW will assist with appropriate discharge planning Update 09/20/23: No changes at this time. Update: 09/25/2023 no changes at this time.   Reason for Continuation of Hospitalization: Hallucinations Medication stabilization   Estimated Length of Stay: 1 to 7 days Update 09/20/23: TBD. Update 09/25/2023: TBD    Last 3 [place] Suicide Severity Risk Score: Flowsheet Row Admission (Current) from 09/14/2023 in [facility] ED from 09/12/2023 in [facility] Emergency Department at [facility] Admission (Discharged) from 02/26/2022 in [facility] BEH[facility]AL MEDICINE  C-SSRS RISK CATEGORY Moderate Risk No Risk No Risk       Last PHQ 2/9 Scores:    05/02/2015    1:50 PM  Depression screen PHQ 2/9  Decreased Interest 0  Down, Depressed, Hopeless 0  PHQ - 2 Score 0    Scribe for Treatment  Team: Friddie Jetty, LCSW 09/25/2023 11:02 AM

## 2023-09-25 NOTE — Progress Notes (Signed)
 [facility] MD Progress Note  09/25/2023 3:50 PM Rita Sullivan  MRN:  [id number] Patient is a 68 year old female with history of schizoaffective disorder, bipolar type presented with command auditory hallucinations telling her to kill self. Patient currently admitted to geropsych unit for further stabilization   Subjective:  Chart reviewed, case discussed in multidisciplinary meeting, patient seen during rounds.  Patient is noted to be sitting in chair.  Patient reports that she was having some weird things and she was confused and there were voices and telepathy happening and she was hearing voices which are weird and she exhibits no insight at this time denies any suicidal and homicidal thoughts.    Sleep: Fair  Appetite:  Fair  Past Psychiatric History: see h&P Family History:  Family History  Problem Relation Age of Onset   Diabetes Father    Diabetes Sister    Social History:  Social History   Substance and Sexual Activity  Alcohol Use Yes   Alcohol/week: 2.0 standard drinks of alcohol   Types: 2 Glasses of wine per week   Comment: Occ     Social History   Substance and Sexual Activity  Drug Use No    Social History   Socioeconomic History   Marital status: Married    Spouse name: Not on file   Number of children: Not on file   Years of education: Not on file   Highest education level: Not on file  Occupational History   Not on file  Tobacco Use   Smoking status: Never   Smokeless tobacco: Never  Vaping Use   Vaping status: Not on file  Substance and Sexual Activity   Alcohol use: Yes    Alcohol/week: 2.0 standard drinks of alcohol    Types: 2 Glasses of wine per week    Comment: Occ   Drug use: No   Sexual activity: Never  Other Topics Concern   Not on file  Social History Narrative   Not on file   Social Drivers of Health   Financial Resource Strain: Low Risk  (02/23/2023)   Received from [facility]   Overall Financial Resource  Strain (CARDIA)    Difficulty of Paying Living Expenses: Not very hard  Food Insecurity: No Food Insecurity (09/14/2023)   Hunger Vital Sign    Worried About Running Out of Food in the Last Year: Never true    Ran Out of Food in the Last Year: Never true  Transportation Needs: No Transportation Needs (09/14/2023)   PRAPARE - Administrator, Civil Service (Medical): No    Lack of Transportation (Non-Medical): No  Physical Activity: Not on file  Stress: Not on file  Social Connections: Socially Isolated (09/14/2023)   Social Connection and Isolation Panel [NHANES]    Frequency of Communication with Friends and Family: Never    Frequency of Social Gatherings with Friends and Family: Never    Attends Religious Services: Never    Database administrator or Organizations: No    Attends Engineer, structural: Never    Marital Status: Married   Past Medical History:  Past Medical History:  Diagnosis Date   Bipolar disorder (HCC)    Chronic kidney disease    Diabetes mellitus without complication (HCC)    Hypercholesteremia    Manic behavior (HCC)     Past Surgical History:  Procedure Laterality Date   COLONOSCOPY WITH PROPOFOL  N/A 03/11/2015   Procedure: COLONOSCOPY WITH PROPOFOL ;  Surgeon: Donavon Fudge  Raphael Cabal, MD;  Location: [facility] ENDOSCOPY;  Service: Gastroenterology;  Laterality: N/A;    Current Medications: Current Facility-Administered Medications  Medication Dose Route Frequency Provider Last Rate Last Admin   acetaminophen  (TYLENOL ) tablet 650 mg  650 mg Oral Q6H PRN Onuoha, Chinwendu V, NP       alum & mag hydroxide-simeth (MAALOX/MYLANTA) 200-200-20 MG/5ML suspension 15 mL  15 mL Oral Q6H PRN Onuoha, Chinwendu V, NP       ARIPiprazole  (ABILIFY ) tablet 10 mg  10 mg Oral BID Jadapalle, Sree, MD   10 mg at 09/25/23 1136   benztropine  (COGENTIN ) tablet 0.5 mg  0.5 mg Oral BID PRN Jadapalle, Sree, MD       glipiZIDE  (GLUCOTROL ) tablet 5 mg  5 mg Oral QAC breakfast Onuoha,  Chinwendu V, NP   5 mg at 09/25/23 0809   insulin  aspart (novoLOG ) injection 0-15 Units  0-15 Units Subcutaneous TID WC Onuoha, Chinwendu V, NP   3 Units at 09/24/23 1914   insulin  glargine-yfgn (SEMGLEE ) injection 25 Units  25 Units Subcutaneous BID Djan, Prince T, MD   25 Units at 09/25/23 1134   linagliptin  (TRADJENTA ) tablet 5 mg  5 mg Oral Daily Antoniette Batty T, MD   5 mg at 09/25/23 1136   loxapine (LOXITANE) capsule 25 mg  25 mg Oral Daily Jadapalle, Sree, MD   25 mg at 09/25/23 1136   magnesium  hydroxide (MILK OF MAGNESIA) suspension 15 mL  15 mL Oral Daily PRN Onuoha, Chinwendu V, NP       metFORMIN  (GLUCOPHAGE ) tablet 1,000 mg  1,000 mg Oral BID WC Zhang, Ping T, MD   1,000 mg at 09/25/23 7829   OLANZapine  (ZYPREXA ) injection 5 mg  5 mg Intramuscular TID PRN Onuoha, Chinwendu V, NP       OLANZapine  zydis (ZYPREXA ) disintegrating tablet 5 mg  5 mg Oral TID PRN Onuoha, Chinwendu V, NP   5 mg at 09/19/23 0141   Oxcarbazepine  (TRILEPTAL ) tablet 300 mg  300 mg Oral BID Jadapalle, Sree, MD   300 mg at 09/25/23 1136   traZODone  (DESYREL ) tablet 100 mg  100 mg Oral QHS Jadapalle, Sree, MD   100 mg at 09/24/23 2117    Lab Results:  Results for orders placed or performed during the hospital encounter of 09/14/23 (from the past 48 hours)  Glucose, capillary     Status: Abnormal   Collection Time: 09/23/23  4:55 PM  Result Value Ref Range   Glucose-Capillary 180 (H) 70 - 99 mg/dL    Comment: Glucose reference range applies only to samples taken after fasting for at least 8 hours.  Glucose, capillary     Status: Abnormal   Collection Time: 09/23/23  7:26 PM  Result Value Ref Range   Glucose-Capillary 193 (H) 70 - 99 mg/dL    Comment: Glucose reference range applies only to samples taken after fasting for at least 8 hours.   Comment 1 Notify RN   Glucose, capillary     Status: Abnormal   Collection Time: 09/24/23  7:49 AM  Result Value Ref Range   Glucose-Capillary 192 (H) 70 - 99 mg/dL     Comment: Glucose reference range applies only to samples taken after fasting for at least 8 hours.  Glucose, capillary     Status: Abnormal   Collection Time: 09/24/23 12:01 PM  Result Value Ref Range   Glucose-Capillary 113 (H) 70 - 99 mg/dL    Comment: Glucose reference range applies only to samples  taken after fasting for at least 8 hours.  Glucose, capillary     Status: Abnormal   Collection Time: 09/24/23  4:33 PM  Result Value Ref Range   Glucose-Capillary 183 (H) 70 - 99 mg/dL    Comment: Glucose reference range applies only to samples taken after fasting for at least 8 hours.  Glucose, capillary     Status: Abnormal   Collection Time: 09/24/23  7:16 PM  Result Value Ref Range   Glucose-Capillary 205 (H) 70 - 99 mg/dL    Comment: Glucose reference range applies only to samples taken after fasting for at least 8 hours.  Glucose, capillary     Status: None   Collection Time: 09/25/23  7:31 AM  Result Value Ref Range   Glucose-Capillary 90 70 - 99 mg/dL    Comment: Glucose reference range applies only to samples taken after fasting for at least 8 hours.  Glucose, capillary     Status: None   Collection Time: 09/25/23 11:26 AM  Result Value Ref Range   Glucose-Capillary 73 70 - 99 mg/dL    Comment: Glucose reference range applies only to samples taken after fasting for at least 8 hours.  Glucose, capillary     Status: Abnormal   Collection Time: 09/25/23 12:48 PM  Result Value Ref Range   Glucose-Capillary 133 (H) 70 - 99 mg/dL    Comment: Glucose reference range applies only to samples taken after fasting for at least 8 hours.    Blood Alcohol level:  Lab Results  Component Value Date   ETH <10 09/12/2023   ETH <10 02/26/2022    Metabolic Disorder Labs: Lab Results  Component Value Date   HGBA1C 7.2 (H) 09/12/2023   MPG 159.94 09/12/2023   MPG 191.51 03/06/2022   Lab Results  Component Value Date   PROLACTIN 23.3 02/20/2017   PROLACTIN 3.6 (L) 07/31/2016   Lab  Results  Component Value Date   CHOL 194 03/06/2022   TRIG 113 03/06/2022   HDL 63 03/06/2022   CHOLHDL 3.1 03/06/2022   VLDL 23 03/06/2022   LDLCALC 108 (H) 03/06/2022   LDLCALC 131 (H) 02/20/2017    Physical Findings: AIMS:  , ,  ,  ,    CIWA:    COWS:      Psychiatric Specialty Exam:  Presentation  General Appearance:  Casual, improved Eye Contact: Fair Speech: Normal rate rhythm Speech Volume: Improved   Mood and Affect  Mood:fine Affect: flat  Thought Process  Thought Processes:scattered, impoverished Descriptions of Associations: None reported Orientation: X 3 Thought Content: Impoverished, delusions,paranoia Hallucinations:auditory-chronic, improving  Ideas of Reference: None  Suicidal Thoughts:denies  Homicidal Thoughts:denies   Sensorium  Memory: Fair Judgment: Impaired  Insight: Shallow   Executive Functions  Concentration: Poor  Attention Span: Improved Recall: Improved Fund of Knowledge: Improved Language: Improved  Psychomotor Activity  slow  Musculoskeletal: Strength & Muscle Tone: within normal limits Gait & Station: normal Assets  Assets: Resilience; Social Support    Physical Exam: Physical Exam Vitals and nursing note reviewed.    Review of Systems  Unable to perform ROS: Psychiatric disorder   Blood pressure 125/77, pulse 96, temperature 97.9 F (36.6 C), resp. rate 18, height 5\' 2"  (1.575 m), weight 71.4 kg, SpO2 98%. Body mass index is 28.81 kg/m.  Diagnosis: Principal Problem:   Schizoaffective disorder, bipolar type (HCC) Active Problems:   Auditory hallucinations  Clinical Decision Making: Patient with history of schizoaffective disorder currently noncompliant with medication, displaying  significant amount of disorganized behavior, leaving the house and wandering/driving on the streets aimlessly, endorsing command type auditory hallucinations telling her to kill herself, lacks insight and  refusing treatment.  Patient is currently admitted to geropsych unit on IVC for further stabilization.   Treatment Plan Summary:   Safety and Monitoring:             -- Involuntary admission to inpatient psychiatric unit for safety, stabilization and treatment             -- Daily contact with patient to assess and evaluate symptoms and progress in treatment             -- Patient's case to be discussed in multi-disciplinary team meeting             -- Observation Level: q15 minute checks             -- Vital signs:  q12 hours             -- Precautions: suicide, elopement, and assault   2. Psychiatric Diagnoses and Treatment:  Will consider second antipsychotic to help with residual psychotic sx that are impairing her function- Loxapine 25 mg daily            Abilify  10 mg BID  Abilify  maintaina 400 mg IM/ on 4 /25/25  Trileptal  300mg  bID for mood stabilization If patient refuses oral medication will initiate enforced medication consult     -- The risks/benefits/side-effects/alternatives to this medication were discussed in detail with the patient and time was given for questions. The patient consents to medication trial.                -- Metabolic profile and EKG monitoring obtained while on an atypical antipsychotic (BMI: Lipid Panel: HbgA1c: QTc:)              -- Encouraged patient to participate in unit milieu and in scheduled group therapies                            3. Medical Issues Being Addressed:  Hospitalist consult is made given her blood sugars more than 550 and her insulin  is adjusted   4. Discharge Planning:              -- Social work and case management to assist with discharge planning and identification of hospital follow-up needs prior to discharge             -- Estimated LOS: 1-2 days             -- Discharge Concerns: Need to establish a safety plan; Medication compliance and effectiveness             -- Discharge Goals: Return home with outpatient referrals  follow ups   Physician Treatment Plan for Primary Diagnosis: Schizoaffective disorder, bipolar type (HCC) Long Term Goal(s): Improvement in symptoms so as ready for discharge   Short Term Goals: Ability to identify changes in lifestyle to reduce recurrence of condition will improve, Ability to verbalize feelings will improve, Ability to disclose and discuss suicidal ideas, Ability to demonstrate self-control will improve, and Ability to identify and develop effective coping behaviors will improve   Physician Treatment Plan for Secondary Diagnosis: Principal Problem:   Schizoaffective disorder, bipolar type (HCC) Active Problems:   Auditory hallucinations   Long Term Goal(s): Improvement in symptoms so as ready for discharge   Short Term Goals: Ability  to identify changes in lifestyle to reduce recurrence of condition will improve, Ability to verbalize feelings will improve, Ability to disclose and discuss suicidal ideas, Ability to demonstrate self-control will improve, and Ability to identify and develop effective coping behaviors will improve  Lindajo Resides, MD 09/25/2023, 3:50 PM

## 2023-09-26 DIAGNOSIS — F250 Schizoaffective disorder, bipolar type: Secondary | ICD-10-CM | POA: Diagnosis not present

## 2023-09-26 LAB — GLUCOSE, CAPILLARY
Glucose-Capillary: 134 mg/dL — ABNORMAL HIGH (ref 70–99)
Glucose-Capillary: 148 mg/dL — ABNORMAL HIGH (ref 70–99)
Glucose-Capillary: 69 mg/dL — ABNORMAL LOW (ref 70–99)
Glucose-Capillary: 80 mg/dL (ref 70–99)
Glucose-Capillary: 227 mg/dL — ABNORMAL HIGH (ref 70–99)

## 2023-09-26 NOTE — Progress Notes (Signed)
   09/26/23 0645  15 Minute Checks  Location Bedroom  Visual Appearance Calm  Behavior Composed  Sleep (Behavioral Health Patients Only)  Calculate sleep? (Click Yes once per 24 hr at 0600 safety check) Yes  Documented sleep last 24 hours 4.5

## 2023-09-26 NOTE — Group Note (Signed)
 Date:  09/26/2023 Time:  9:46 PM  Group Topic/Focus:  Wrap-Up Group:   The focus of this group is to help patients review their daily goal of treatment and discuss progress on daily workbooks.    Participation Level:  Minimal  Participation Quality:  Attentive  Affect:  Appropriate  Cognitive:  Oriented  Insight: Improving  Engagement in Group:  Improving  Modes of Intervention:  Discussion  Additional Comments:    Rolland Cline 09/26/2023, 9:46 PM

## 2023-09-26 NOTE — Progress Notes (Signed)
   09/25/23 2100  Psych Admission Type (Psych Patients Only)  Admission Status Voluntary  Psychosocial Assessment  Patient Complaints None  Eye Contact Fair  Facial Expression Flat  Affect Anxious  Speech Slow  Interaction Minimal  Motor Activity Pacing  Appearance/Hygiene In scrubs  Behavior Characteristics Pacing  Mood Preoccupied  Thought Process  Coherency WDL  Content Preoccupation  Delusions Paranoid  Perception WDL  Hallucination None reported or observed  Judgment Impaired  Confusion Mild  Danger to Self  Current suicidal ideation? Denies   Pt is disorganized and confused. Walking up/down the hall. Limited interaction with peers and staff. Po meds given as scheduled. Denies SI/HI/AVH. No c/o pain/discomfort noted.

## 2023-09-26 NOTE — Progress Notes (Signed)
 Patient: Rita Sullivan MRN: [id number] Date of Note: Sep 26, 2023 Time of Note: 9:14 PM EDT  Reason for Encounter: Daily Progress Note  History of Present Illness Summary: Rita Sullivan is a 68 year old female with a history of Schizoaffective Disorder, Bipolar Type, admitted involuntarily to the [facility] Psych unit due to command auditory hallucinations (CAH) telling her to kill herself, related to medication non-compliance. She also has significant medical comorbidities including poorly controlled Diabetes Mellitus and Chronic Kidney Disease. Admitted for psychiatric stabilization and safety.  Principal Diagnosis: Schizoaffective disorder, bipolar type (HCC).  Subjective: Patient reported she is "doing well today" with "no major concerns." This represents a significant improvement from yesterday when she described confusion, hearing voices, and experiencing "telepathy." Today, she denies any suicidal ideation, homicidal ideation, or current auditory hallucinations. Insight into her illness likely remains limited despite subjective improvement. Fair sleep and appetite reported previously require confirmation for today.  Objective:  Admission Status: Involuntary, [facility] unit. Behavior: Patient was observed to be calm and cooperative throughout the day. No behavioral disturbances or concerns were reported by staff. She was noted sitting calmly in a chair during rounds yesterday. Milieu participation today appears appropriate based on overall report. Mental Status Exam: Appearance: Casual, noted as improved yesterday. Eye Contact: Fair. Speech: Normal rate, improved volume noted yesterday. Mood: Appears stable today based on self-report ("doing well"). Affect: Likely stable, though may have underlying flatness (noted yesterday). Thought Process: Appears more organized compared to yesterday's report of being scattered/confused. Thought Content: Denies SI/HI/AH today. Delusions and paranoia  noted yesterday appear resolved or quiescent at present. Insight remains shallow, judgment impaired. Orientation intact (x3). Cognition: Baseline deficits likely persist, but some functional improvement noted yesterday (attention, recall, language). Concentration likely remains poor. Psychomotor activity may be slow. Medications: Continues complex regimen including Aripiprazole  (Abilify ) 10mg  PO BID, Loxapine 25mg  PO Daily, Oxcarbazepine  (Trileptal ) 300mg  PO BID, Trazodone  100mg  PO QHS, plus Abilify  Maintena 400mg  IM (last 09/17/23). Also receiving multiple medications for Diabetes Mellitus (Glipizide , Metformin , Linagliptin , Insulin  Glargine BID, Insulin  Aspart SS TID WC). PRNs available: Benztropine , Olanzapine , comfort meds. Monitoring: Currently on Q15 minute checks. Standard precautions (Suicide/Elopement/Assault). Vitals Q12hr. Blood glucose monitoring actively managed via sliding scale and Hospitalist recommendations (multiple elevated readings noted in past 48 hrs). Metabolic/EKG monitoring ongoing. Assessment: Rita Sullivan, a 68 y.o. female with Schizoaffective Disorder, Bipolar Type, and complex medical issues (esp. DM), has shown marked improvement in her acute psychiatric symptoms today. She denies the command auditory hallucinations, SI, and confusion reported previously and states she is "doing well." Her behavior is calm and cooperative. The current multi-faceted psychotropic regimen (Abilify  LAI augmented with oral Abilify  and Loxapine, plus Oxcarbazepine ) appears effective in stabilizing her acute psychosis and mood symptoms. Despite this clinical improvement, her insight and judgment remain poor, consistent with her chronic illness. Continued inpatient stay is necessary for further stabilization, ensuring medication adherence, monitoring her complex medical status (particularly DM control), and developing a safe discharge plan. Risk for decompensation remains if medication adherence wanes  post-discharge.  Plan:  Medications: Continue current psychotropic regimen: Aripiprazole  10mg  PO BID, Loxapine 25mg  PO Daily, Oxcarbazepine  300mg  PO BID, Trazodone  100mg  PO QHS. (Abilify  Maintena 400mg  IM ongoing, next dose ~10/15/23). Continue all Diabetes Mellitus medications as prescribed by primary team/hospitalist. Monitor blood glucose closely per protocol and adjust sliding scale insulin  accordingly. Continue availability of PRN Benztropine  for EPS and PRN Olanzapine  for agitation (though none needed today). Monitor for continued efficacy and potential side effects (metabolic syndrome risk high  given DM and antipsychotics, sedation, EPS, orthostasis). Monitoring: Maintain Q15 minute checks. Continue standard precautions. Monitor vital signs Q12hr. Continue fingerstick blood glucose monitoring as ordered. Review metabolic panel/EKG results. Assess mental status daily, confirming ongoing stability (absence of AH/SI/HI, mood stability). Monitor medication acceptance/adherence. Assess sleep/appetite. Therapeutic Interventions: Encourage continued appropriate milieu participation. Provide supportive interactions reinforcing stability. Begin/continue psychoeducation regarding illness management and critical importance of medication adherence (oral and LAI). Medical: Continue co-management with Hospitalist service for Diabetes Mellitus. Ensure communication regarding glucose control and any medication interactions. Psychosocial/Disposition: Continue SW/CM involvement. Assess patient's understanding (despite limited insight) of discharge plan. Evaluate home support systems and need for enhanced services (e.g., ACT team, visiting nurse) to support medication adherence and monitor status post-discharge.

## 2023-09-26 NOTE — Plan of Care (Signed)
  Problem: Education: Goal: Ability to make informed decisions regarding treatment will improve Outcome: Progressing   Problem: Coping: Goal: Coping ability will improve Outcome: Progressing   Problem: Health Behavior/Discharge Planning: Goal: Identification of resources available to assist in meeting health care needs will improve Outcome: Progressing   Problem: Medication: Goal: Compliance with prescribed medication regimen will improve Outcome: Progressing   Problem: Self-Concept: Goal: Ability to disclose and discuss suicidal ideas will improve Outcome: Progressing   

## 2023-09-26 NOTE — Progress Notes (Signed)
 Patient was cooperative with treatment on the shift, she was compliant with medications. She spent most of the shift resting in the bed. No new behavioral issues to report on shift at this time.

## 2023-09-26 NOTE — Plan of Care (Signed)
   Problem: Coping: Goal: Ability to adjust to condition or change in health will improve Outcome: Progressing

## 2023-09-27 DIAGNOSIS — F250 Schizoaffective disorder, bipolar type: Secondary | ICD-10-CM | POA: Diagnosis not present

## 2023-09-27 LAB — GLUCOSE, CAPILLARY
Glucose-Capillary: 152 mg/dL — ABNORMAL HIGH (ref 70–99)
Glucose-Capillary: 133 mg/dL — ABNORMAL HIGH (ref 70–99)
Glucose-Capillary: 104 mg/dL — ABNORMAL HIGH (ref 70–99)
Glucose-Capillary: 134 mg/dL — ABNORMAL HIGH (ref 70–99)

## 2023-09-27 MED ORDER — INSULIN GLARGINE-YFGN 100 UNIT/ML ~~LOC~~ SOLN
23.0000 [IU] | Freq: Two times a day (BID) | SUBCUTANEOUS | Status: DC
  Administered 2023-09-27 – 2023-09-29 (×4): 23 [IU] via SUBCUTANEOUS
  Filled 2023-09-27 (×5): qty 0.23

## 2023-09-27 MED ORDER — LOXAPINE SUCCINATE 5 MG PO CAPS
25.0000 mg | ORAL_CAPSULE | Freq: Every day | ORAL | Status: DC
  Administered 2023-09-28: 25 mg via ORAL
  Filled 2023-09-27: qty 5

## 2023-09-27 NOTE — Group Note (Signed)
 Date:  09/27/2023 Time:  2:13 PM  Group Topic/Focus:  Healthy Communication:   The focus of this group is to discuss communication, barriers to communication, as well as healthy ways to communicate with others.    Participation Level:  Did Not Attend  Participation Quality:    Affect:    Cognitive:    Insight:   Engagement in Group:    Modes of Intervention:    Additional Comments:    Mikhaela Zaugg 09/27/2023, 2:13 PM

## 2023-09-27 NOTE — Plan of Care (Signed)
  Problem: Education: Goal: Ability to make informed decisions regarding treatment will improve Outcome: Progressing   Problem: Coping: Goal: Coping ability will improve Outcome: Progressing   Problem: Health Behavior/Discharge Planning: Goal: Identification of resources available to assist in meeting health care needs will improve Outcome: Progressing   Problem: Medication: Goal: Compliance with prescribed medication regimen will improve Outcome: Progressing   Problem: Self-Concept: Goal: Ability to disclose and discuss suicidal ideas will improve Outcome: Progressing   

## 2023-09-27 NOTE — Inpatient Diabetes Management (Signed)
 Inpatient Diabetes Program Recommendations  AACE/ADA: New Consensus Statement on Inpatient Glycemic Control   Target Ranges:  Prepandial:   less than 140 mg/dL      Peak postprandial:   less than 180 mg/dL (1-2 hours)      Critically ill patients:  140 - 180 mg/dL    Latest Reference Range & Units 09/26/23 07:33 09/26/23 11:02 09/26/23 16:51 09/26/23 17:16 09/26/23 21:53 09/27/23 07:15  Glucose-Capillary 70 - 99 mg/dL 564 (H) 332 (H) 69 (L) 80 227 (H) 152 (H)   Review of Glycemic Control  Diabetes history: DM2 Outpatient Diabetes medications: Glipizide  5 mg daily, Semglee  10 units at bedtime, Januvia 50 mg daily, Metformin  1000 mg BID Current orders for Inpatient glycemic control: Semglee  25 units BID, Novolog  0-15 units TID with meals, Metformin  1000 mg BID, Tradjenta  5 mg daily, Glipizide  5 mg QAM  Inpatient Diabetes Program Recommendations:    DM medication: CBG down to 69 on 09/26/23 and 67 and 73 mg/dl on 01/27/17. Please consider discontinuing Glipizide  and decreasing Semglee  to 23 units BID.  Thanks, Beacher Limerick, RN, MSN, [facility] Diabetes Coordinator Inpatient Diabetes Program [phone] (Team Pager from 8am to 5pm)

## 2023-09-27 NOTE — Plan of Care (Signed)
  Problem: Coping: Goal: Ability to adjust to condition or change in health will improve Outcome: Progressing   Problem: Fluid Volume: Goal: Ability to maintain a balanced intake and output will improve Outcome: Progressing   Problem: Health Behavior/Discharge Planning: Goal: Ability to identify and utilize available resources and services will improve Outcome: Progressing Goal: Ability to manage health-related needs will improve Outcome: Progressing

## 2023-09-27 NOTE — Progress Notes (Signed)
   09/26/23 2200  Psych Admission Type (Psych Patients Only)  Admission Status Voluntary  Psychosocial Assessment  Patient Complaints None  Eye Contact Fair  Facial Expression Flat  Affect Appropriate to circumstance  Speech Slow  Interaction Minimal  Motor Activity Slow  Appearance/Hygiene In scrubs  Behavior Characteristics Cooperative  Mood Preoccupied  Thought Process  Coherency WDL  Content Preoccupation  Delusions None reported or observed  Perception WDL  Hallucination None reported or observed  Judgment Impaired  Confusion Mild  Danger to Self  Current suicidal ideation? Denies

## 2023-09-27 NOTE — Group Note (Signed)
 Recreation Therapy Group Note   Group Topic:Relaxation  Group Date: 09/27/2023 Start Time: 1400 End Time: 1450 Facilitators: Deatrice Factor, LRT, CTRS Location: Courtyard  Group Description: Mindfulness Body Scan. LRT educated on the benefits of mindfulness and how it can apply to everyday life post-discharge. LRT and pt's followed along to an audio script of a "mindfulness body scan" video. Afterwards, LRT encourages patients to help assist with watering the raised garden beds.   Goal Area(s) Addressed: Patient will practice using relaxation technique. Patient will identify a new coping skill.  Patient will follow multistep directions to reduce anxiety and stress.   Affect/Mood: Appropriate   Participation Level: Active and Engaged   Participation Quality: Independent   Behavior: Calm and Cooperative   Speech/Thought Process: Coherent   Insight: Good   Judgement: Good   Modes of Intervention: Education, Exploration, and Support   Patient Response to Interventions:  Attentive, Engaged, and Receptive   Education Outcome:  Acknowledges education   Clinical Observations/Individualized Feedback: Rita Sullivan was active in their participation of session activities and group discussion. Pt helped LRT water flowers. Pt completed all prompts as encouraged. Pt interacted well with LRT and peers duration of session.    Plan: Continue to engage patient in RT group sessions 2-3x/week.   Deatrice Factor, LRT, CTRS 09/27/2023 4:43 PM

## 2023-09-27 NOTE — Progress Notes (Signed)
 Patient calm and cooperative throughout shift. Compliant with most medications. No new behavioral incidents during this Rns shift. Patient denies SI/HI/AH/VH to this RN when assessed.

## 2023-09-27 NOTE — Progress Notes (Signed)
 [facility] MD Progress Note  09/27/2023 2:50 PM Rita Sullivan  MRN:  [id number] Patient is a 68 year old female with history of schizoaffective disorder, bipolar type presented with command auditory hallucinations telling her to kill self. Patient currently admitted to geropsych unit for further stabilization   Subjective:  Chart reviewed, case discussed in multidisciplinary meeting, patient seen during rounds.  Patient is noted to be resting in bed.  She reports having a nice weekend.  Per nursing report patient has displayed significant improvement in paranoia on Sunday since she was started on loxapine as second antipsychotic.  Patient denies any side effects including EPS.  She denies having any auditory/visual hallucinations today.  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  They have been married since 1998 and she has a good relationship.  She reports she always has auditory hallucinations and had to take a lot of psychiatric medications for the voices to go away but there is always residual symptoms as per patient.  She denies suicidal/homicidal ideation/plan.    Sleep: Fair  Appetite:  Fair  Past Psychiatric History: see h&P Family History:  Family History  Problem Relation Age of Onset   Diabetes Father    Diabetes Sister    Social History:  Social History   Substance and Sexual Activity  Alcohol Use Yes   Alcohol/week: 2.0 standard drinks of alcohol   Types: 2 Glasses of wine per week   Comment: Occ     Social History   Substance and Sexual Activity  Drug Use No    Social History   Socioeconomic History   Marital status: Married    Spouse name: Not on file   Number of children: Not on file   Years of education: Not on file   Highest education level: Not on file  Occupational History   Not on file  Tobacco Use   Smoking status: Never   Smokeless tobacco: Never  Vaping Use   Vaping status: Not on file  Substance and Sexual  Activity   Alcohol use: Yes    Alcohol/week: 2.0 standard drinks of alcohol    Types: 2 Glasses of wine per week    Comment: Occ   Drug use: No   Sexual activity: Never  Other Topics Concern   Not on file  Social History Narrative   Not on file   Social Drivers of Health   Financial Resource Strain: Low Risk  (02/23/2023)   Received from [facility]   Overall Financial Resource Strain (CARDIA)    Difficulty of Paying Living Expenses: Not very hard  Food Insecurity: No Food Insecurity (09/14/2023)   Hunger Vital Sign    Worried About Running Out of Food in the Last Year: Never true    Ran Out of Food in the Last Year: Never true  Transportation Needs: No Transportation Needs (09/14/2023)   PRAPARE - Administrator, Civil Service (Medical): No    Lack of Transportation (Non-Medical): No  Physical Activity: Not on file  Stress: Not on file  Social Connections: Socially Isolated (09/14/2023)   Social Connection and Isolation Panel [NHANES]    Frequency of Communication with Friends and Family: Never    Frequency of Social Gatherings with Friends and Family: Never    Attends Religious Services: Never    Database administrator or Organizations: No    Attends Banker Meetings: Never    Marital  Status: Married   Past Medical History:  Past Medical History:  Diagnosis Date   Bipolar disorder (HCC)    Chronic kidney disease    Diabetes mellitus without complication (HCC)    Hypercholesteremia    Manic behavior (HCC)     Past Surgical History:  Procedure Laterality Date   COLONOSCOPY WITH PROPOFOL  N/A 03/11/2015   Procedure: COLONOSCOPY WITH PROPOFOL ;  Surgeon: Stephens Eis, MD;  Location: [facility] ENDOSCOPY;  Service: Gastroenterology;  Laterality: N/A;    Current Medications: Current Facility-Administered Medications  Medication Dose Route Frequency Provider Last Rate Last Admin   acetaminophen  (TYLENOL ) tablet 650 mg  650 mg Oral Q6H PRN  Onuoha, Chinwendu V, NP       alum & mag hydroxide-simeth (MAALOX/MYLANTA) 200-200-20 MG/5ML suspension 15 mL  15 mL Oral Q6H PRN Onuoha, Chinwendu V, NP       ARIPiprazole  (ABILIFY ) tablet 10 mg  10 mg Oral BID Jethro Radke, MD   10 mg at 09/27/23 4098   benztropine  (COGENTIN ) tablet 0.5 mg  0.5 mg Oral BID PRN Kameran Lallier, MD       insulin  aspart (novoLOG ) injection 0-15 Units  0-15 Units Subcutaneous TID WC Onuoha, Chinwendu V, NP   2 Units at 09/26/23 1216   insulin  glargine-yfgn (SEMGLEE ) injection 23 Units  23 Units Subcutaneous BID Demetrios Byron, MD       linagliptin  (TRADJENTA ) tablet 5 mg  5 mg Oral Daily Antoniette Batty T, MD   5 mg at 09/27/23 0821   [START ON 09/28/2023] loxapine (LOXITANE) capsule 25 mg  25 mg Oral QHS Oveta Idris, MD       magnesium  hydroxide (MILK OF MAGNESIA) suspension 15 mL  15 mL Oral Daily PRN Onuoha, Chinwendu V, NP       metFORMIN  (GLUCOPHAGE ) tablet 1,000 mg  1,000 mg Oral BID WC Zhang, Ping T, MD   1,000 mg at 09/27/23 1191   OLANZapine  (ZYPREXA ) injection 5 mg  5 mg Intramuscular TID PRN Onuoha, Chinwendu V, NP       OLANZapine  zydis (ZYPREXA ) disintegrating tablet 5 mg  5 mg Oral TID PRN Onuoha, Chinwendu V, NP   5 mg at 09/19/23 0141   Oxcarbazepine  (TRILEPTAL ) tablet 300 mg  300 mg Oral BID Marjo Grosvenor, MD   300 mg at 09/27/23 4782   traZODone  (DESYREL ) tablet 100 mg  100 mg Oral QHS Leovardo Thoman, MD   100 mg at 09/26/23 2155    Lab Results:  Results for orders placed or performed during the hospital encounter of 09/14/23 (from the past 48 hours)  Glucose, capillary     Status: Abnormal   Collection Time: 09/25/23  4:15 PM  Result Value Ref Range   Glucose-Capillary 192 (H) 70 - 99 mg/dL    Comment: Glucose reference range applies only to samples taken after fasting for at least 8 hours.  Glucose, capillary     Status: Abnormal   Collection Time: 09/25/23  8:02 PM  Result Value Ref Range   Glucose-Capillary 67 (L) 70 - 99 mg/dL     Comment: Glucose reference range applies only to samples taken after fasting for at least 8 hours.  Glucose, capillary     Status: None   Collection Time: 09/25/23  8:09 PM  Result Value Ref Range   Glucose-Capillary 73 70 - 99 mg/dL    Comment: Glucose reference range applies only to samples taken after fasting for at least 8 hours.  Glucose, capillary  Status: Abnormal   Collection Time: 09/25/23  9:29 PM  Result Value Ref Range   Glucose-Capillary 159 (H) 70 - 99 mg/dL    Comment: Glucose reference range applies only to samples taken after fasting for at least 8 hours.  Glucose, capillary     Status: Abnormal   Collection Time: 09/26/23  7:33 AM  Result Value Ref Range   Glucose-Capillary 134 (H) 70 - 99 mg/dL    Comment: Glucose reference range applies only to samples taken after fasting for at least 8 hours.  Glucose, capillary     Status: Abnormal   Collection Time: 09/26/23 11:02 AM  Result Value Ref Range   Glucose-Capillary 148 (H) 70 - 99 mg/dL    Comment: Glucose reference range applies only to samples taken after fasting for at least 8 hours.  Glucose, capillary     Status: Abnormal   Collection Time: 09/26/23  4:51 PM  Result Value Ref Range   Glucose-Capillary 69 (L) 70 - 99 mg/dL    Comment: Glucose reference range applies only to samples taken after fasting for at least 8 hours.  Glucose, capillary     Status: None   Collection Time: 09/26/23  5:16 PM  Result Value Ref Range   Glucose-Capillary 80 70 - 99 mg/dL    Comment: Glucose reference range applies only to samples taken after fasting for at least 8 hours.  Glucose, capillary     Status: Abnormal   Collection Time: 09/26/23  9:53 PM  Result Value Ref Range   Glucose-Capillary 227 (H) 70 - 99 mg/dL    Comment: Glucose reference range applies only to samples taken after fasting for at least 8 hours.  Glucose, capillary     Status: Abnormal   Collection Time: 09/27/23  7:15 AM  Result Value Ref Range    Glucose-Capillary 152 (H) 70 - 99 mg/dL    Comment: Glucose reference range applies only to samples taken after fasting for at least 8 hours.  Glucose, capillary     Status: Abnormal   Collection Time: 09/27/23 11:40 AM  Result Value Ref Range   Glucose-Capillary 133 (H) 70 - 99 mg/dL    Comment: Glucose reference range applies only to samples taken after fasting for at least 8 hours.    Blood Alcohol level:  Lab Results  Component Value Date   ETH <10 09/12/2023   ETH <10 02/26/2022    Metabolic Disorder Labs: Lab Results  Component Value Date   HGBA1C 7.2 (H) 09/12/2023   MPG 159.94 09/12/2023   MPG 191.51 03/06/2022   Lab Results  Component Value Date   PROLACTIN 23.3 02/20/2017   PROLACTIN 3.6 (L) 07/31/2016   Lab Results  Component Value Date   CHOL 194 03/06/2022   TRIG 113 03/06/2022   HDL 63 03/06/2022   CHOLHDL 3.1 03/06/2022   VLDL 23 03/06/2022   LDLCALC 108 (H) 03/06/2022   LDLCALC 131 (H) 02/20/2017    Physical Findings: AIMS:  , ,  ,  ,    CIWA:    COWS:      Psychiatric Specialty Exam:  Presentation  General Appearance:  Casual, improved Eye Contact: Fair Speech: Normal rate rhythm Speech Volume: Improved   Mood and Affect  Mood:fine Affect: flat  Thought Process  Thought Processes:scattered, impoverished Descriptions of Associations: None reported Orientation: X 3 Thought Content: Impoverished, delusions,paranoia Hallucinations:auditory-chronic, improving  Ideas of Reference: None  Suicidal Thoughts:denies  Homicidal Thoughts:denies   Sensorium  Memory: Fair  Judgment: Impaired  Insight: Shallow   Executive Functions  Concentration: Fair  Attention Span: Improved Recall: Improved Fund of Knowledge: Improved Language: Improved  Psychomotor Activity  slow  Musculoskeletal: Strength & Muscle Tone: within normal limits Gait & Station: normal Assets  Assets: Manufacturing systems engineer; Desire for  Improvement; Physical Health    Physical Exam: Physical Exam Vitals and nursing note reviewed.    Review of Systems  Unable to perform ROS: Psychiatric disorder   Blood pressure 116/84, pulse (!) 115, temperature 97.6 F (36.4 C), temperature source Oral, resp. rate 16, height 5\' 2"  (1.575 m), weight 71.4 kg, SpO2 100%. Body mass index is 28.81 kg/m.  Diagnosis: Principal Problem:   Schizoaffective disorder, bipolar type (HCC) Active Problems:   Auditory hallucinations  Clinical Decision Making: Patient with history of schizoaffective disorder currently noncompliant with medication, displaying significant amount of disorganized behavior, leaving the house and wandering/driving on the streets aimlessly, endorsing command type auditory hallucinations telling her to kill herself, lacks insight and refusing treatment.  Patient is currently admitted to geropsych unit on IVC for further stabilization.   Treatment Plan Summary:   Safety and Monitoring:             -- Involuntary admission to inpatient psychiatric unit for safety, stabilization and treatment             -- Daily contact with patient to assess and evaluate symptoms and progress in treatment             -- Patient's case to be discussed in multi-disciplinary team meeting             -- Observation Level: q15 minute checks             -- Vital signs:  q12 hours             -- Precautions: suicide, elopement, and assault   2. Psychiatric Diagnoses and Treatment:  Added second antipsychotic to help with residual psychotic sx that are impairing her function- Loxapine 25 mg adjusted to at bedtime to minimize day time sedation            Abilify  10 mg BID  Abilify  maintaina 400 mg IM/ on 4 /25/25  Trileptal  300mg  bID for mood stabilization If patient refuses oral medication will initiate enforced medication consult     -- The risks/benefits/side-effects/alternatives to this medication were discussed in detail with the  patient and time was given for questions. The patient consents to medication trial.                -- Metabolic profile and EKG monitoring obtained while on an atypical antipsychotic (BMI: Lipid Panel: HbgA1c: QTc:)              -- Encouraged patient to participate in unit milieu and in scheduled group therapies                            3. Medical Issues Being Addressed:  Hospitalist consult is made given her blood sugars more than 550 and her insulin  is adjusted   4. Discharge Planning:              -- Social work and case management to assist with discharge planning and identification of hospital follow-up needs prior to discharge             -- Estimated LOS: 1-2 days             --  Discharge Concerns: Need to establish a safety plan; Medication compliance and effectiveness             -- Discharge Goals: Return home with outpatient referrals follow ups   Physician Treatment Plan for Primary Diagnosis: Schizoaffective disorder, bipolar type (HCC) Long Term Goal(s): Improvement in symptoms so as ready for discharge   Short Term Goals: Ability to identify changes in lifestyle to reduce recurrence of condition will improve, Ability to verbalize feelings will improve, Ability to disclose and discuss suicidal ideas, Ability to demonstrate self-control will improve, and Ability to identify and develop effective coping behaviors will improve   Physician Treatment Plan for Secondary Diagnosis: Principal Problem:   Schizoaffective disorder, bipolar type (HCC) Active Problems:   Auditory hallucinations   Long Term Goal(s): Improvement in symptoms so as ready for discharge   Short Term Goals: Ability to identify changes in lifestyle to reduce recurrence of condition will improve, Ability to verbalize feelings will improve, Ability to disclose and discuss suicidal ideas, Ability to demonstrate self-control will improve, and Ability to identify and develop effective coping behaviors will  improve  Aurelia Blotter, MD 09/27/2023, 2:50 PM

## 2023-09-28 DIAGNOSIS — F250 Schizoaffective disorder, bipolar type: Secondary | ICD-10-CM | POA: Diagnosis not present

## 2023-09-28 LAB — GLUCOSE, CAPILLARY
Glucose-Capillary: 112 mg/dL — ABNORMAL HIGH (ref 70–99)
Glucose-Capillary: 152 mg/dL — ABNORMAL HIGH (ref 70–99)
Glucose-Capillary: 124 mg/dL — ABNORMAL HIGH (ref 70–99)
Glucose-Capillary: 195 mg/dL — ABNORMAL HIGH (ref 70–99)

## 2023-09-28 MED ORDER — OXCARBAZEPINE 300 MG PO TABS
300.0000 mg | ORAL_TABLET | Freq: Two times a day (BID) | ORAL | 0 refills | Status: AC
Start: 2023-09-28 — End: ?

## 2023-09-28 MED ORDER — LINAGLIPTIN 5 MG PO TABS: 5.0000 mg | ORAL_TABLET | Freq: Every day | ORAL | 0 refills | Status: AC

## 2023-09-28 MED ORDER — ARIPIPRAZOLE 10 MG PO TABS: 10.0000 mg | ORAL_TABLET | Freq: Two times a day (BID) | ORAL | 0 refills | Status: AC

## 2023-09-28 MED ORDER — INSULIN ASPART 100 UNIT/ML IJ SOLN
0.0000 [IU] | Freq: Three times a day (TID) | INTRAMUSCULAR | 11 refills | Status: AC
Start: 2023-09-28 — End: ?

## 2023-09-28 MED ORDER — LOXAPINE SUCCINATE 25 MG PO CAPS: 25.0000 mg | ORAL_CAPSULE | Freq: Every day | ORAL | 0 refills | Status: AC

## 2023-09-28 MED ORDER — INSULIN GLARGINE-YFGN 100 UNIT/ML ~~LOC~~ SOLN: 23.0000 [IU] | Freq: Two times a day (BID) | SUBCUTANEOUS | 11 refills | Status: AC

## 2023-09-28 MED ORDER — METFORMIN HCL 1000 MG PO TABS: 1000.0000 mg | ORAL_TABLET | Freq: Two times a day (BID) | ORAL | 0 refills | Status: AC

## 2023-09-28 NOTE — Progress Notes (Signed)
   09/28/23 0000  Psych Admission Type (Psych Patients Only)  Admission Status Voluntary  Psychosocial Assessment  Patient Complaints None  Eye Contact Fair  Facial Expression Flat  Affect Appropriate to circumstance  Speech Slow  Interaction Minimal;Assertive  Motor Activity Slow  Appearance/Hygiene In scrubs  Behavior Characteristics Cooperative  Mood Anxious  Thought Process  Coherency WDL  Content Preoccupation  Delusions None reported or observed  Perception WDL  Hallucination None reported or observed  Judgment Impaired  Confusion Mild  Danger to Self  Current suicidal ideation? Denies  Agreement Not to Harm Self Yes  Description of Agreement verbal  Danger to Others  Danger to Others None reported or observed

## 2023-09-28 NOTE — Progress Notes (Signed)
 Patient denies SI, HI, and AVH this shift. Patient reported severe anxiety and depression. Patient has been guarded, withdrawn and isolative to room this shift.   Assess patient for safety, offer medications as prescribed, engage patient in 1:1 staff talks.   Patient able to contract for safety. Continue to monitor as planned.

## 2023-09-28 NOTE — Progress Notes (Signed)
 [facility] MD Progress Note  09/28/2023 8:07 PM Rodnisha Lagace  MRN:  [id number]  Patient is a 68 year old female with history of schizoaffective disorder, bipolar type presented with command auditory hallucinations telling her to kill self. Patient currently admitted to geropsych unit for further stabilization   Subjective:  Chart reviewed, case discussed in multidisciplinary meeting, patient seen during rounds.  Patient is noted to be resting in her room.  She reports that she is doing well and is looking forward to go home.  Provider discussed the plan to discharge her tomorrow with outpatient follow-up and ACT team through [facility].  She reports having ACT team when she was living in [place]  before.  She denies SI/HI/plan.  She denies hallucinations.  She is tolerating medications with no reported side effects including EPS.   Sleep: Fair  Appetite:  Fair  Past Psychiatric History: see h&P Family History:  Family History  Problem Relation Age of Onset   Diabetes Father    Diabetes Sister    Social History:  Social History   Substance and Sexual Activity  Alcohol Use Yes   Alcohol/week: 2.0 standard drinks of alcohol   Types: 2 Glasses of wine per week   Comment: Occ     Social History   Substance and Sexual Activity  Drug Use No    Social History   Socioeconomic History   Marital status: Married    Spouse name: Not on file   Number of children: Not on file   Years of education: Not on file   Highest education level: Not on file  Occupational History   Not on file  Tobacco Use   Smoking status: Never   Smokeless tobacco: Never  Vaping Use   Vaping status: Not on file  Substance and Sexual Activity   Alcohol use: Yes    Alcohol/week: 2.0 standard drinks of alcohol    Types: 2 Glasses of wine per week    Comment: Occ   Drug use: No   Sexual activity: Never  Other Topics Concern   Not on file  Social History Narrative   Not on file   Social Drivers of  Health   Financial Resource Strain: Low Risk  (02/23/2023)   Received from [facility]   Overall Financial Resource Strain (CARDIA)    Difficulty of Paying Living Expenses: Not very hard  Food Insecurity: No Food Insecurity (09/14/2023)   Hunger Vital Sign    Worried About Running Out of Food in the Last Year: Never true    Ran Out of Food in the Last Year: Never true  Transportation Needs: No Transportation Needs (09/14/2023)   PRAPARE - Administrator, Civil Service (Medical): No    Lack of Transportation (Non-Medical): No  Physical Activity: Not on file  Stress: Not on file  Social Connections: Socially Isolated (09/14/2023)   Social Connection and Isolation Panel [NHANES]    Frequency of Communication with Friends and Family: Never    Frequency of Social Gatherings with Friends and Family: Never    Attends Religious Services: Never    Database administrator or Organizations: No    Attends Engineer, structural: Never    Marital Status: Married   Past Medical History:  Past Medical History:  Diagnosis Date   Bipolar disorder (HCC)    Chronic kidney disease    Diabetes mellitus without complication (HCC)    Hypercholesteremia    Manic behavior (HCC)  Past Surgical History:  Procedure Laterality Date   COLONOSCOPY WITH PROPOFOL  N/A 03/11/2015   Procedure: COLONOSCOPY WITH PROPOFOL ;  Surgeon: Stephens Eis, MD;  Location: [facility] ENDOSCOPY;  Service: Gastroenterology;  Laterality: N/A;    Current Medications: Current Facility-Administered Medications  Medication Dose Route Frequency Provider Last Rate Last Admin   acetaminophen  (TYLENOL ) tablet 650 mg  650 mg Oral Q6H PRN Onuoha, Chinwendu V, NP       alum & mag hydroxide-simeth (MAALOX/MYLANTA) 200-200-20 MG/5ML suspension 15 mL  15 mL Oral Q6H PRN Onuoha, Chinwendu V, NP       ARIPiprazole  (ABILIFY ) tablet 10 mg  10 mg Oral BID Darrie Macmillan, MD   10 mg at 09/28/23 0935   benztropine   (COGENTIN ) tablet 0.5 mg  0.5 mg Oral BID PRN Itzae Mccurdy, MD       insulin  aspart (novoLOG ) injection 0-15 Units  0-15 Units Subcutaneous TID WC Onuoha, Chinwendu V, NP   2 Units at 09/28/23 1730   insulin  glargine-yfgn (SEMGLEE ) injection 23 Units  23 Units Subcutaneous BID Zariana Strub, MD   23 Units at 09/28/23 9147   linagliptin  (TRADJENTA ) tablet 5 mg  5 mg Oral Daily Antoniette Batty T, MD   5 mg at 09/28/23 0935   loxapine (LOXITANE) capsule 25 mg  25 mg Oral QHS Selyna Klahn, MD       magnesium  hydroxide (MILK OF MAGNESIA) suspension 15 mL  15 mL Oral Daily PRN Onuoha, Chinwendu V, NP       metFORMIN  (GLUCOPHAGE ) tablet 1,000 mg  1,000 mg Oral BID WC Zhang, Ping T, MD   1,000 mg at 09/28/23 1730   OLANZapine  (ZYPREXA ) injection 5 mg  5 mg Intramuscular TID PRN Onuoha, Chinwendu V, NP       OLANZapine  zydis (ZYPREXA ) disintegrating tablet 5 mg  5 mg Oral TID PRN Onuoha, Chinwendu V, NP   5 mg at 09/19/23 0141   Oxcarbazepine  (TRILEPTAL ) tablet 300 mg  300 mg Oral BID Shayanna Thatch, MD   300 mg at 09/28/23 0935   traZODone  (DESYREL ) tablet 100 mg  100 mg Oral QHS Tannah Dreyfuss, MD   100 mg at 09/27/23 2130    Lab Results:  Results for orders placed or performed during the hospital encounter of 09/14/23 (from the past 48 hours)  Glucose, capillary     Status: Abnormal   Collection Time: 09/26/23  9:53 PM  Result Value Ref Range   Glucose-Capillary 227 (H) 70 - 99 mg/dL    Comment: Glucose reference range applies only to samples taken after fasting for at least 8 hours.  Glucose, capillary     Status: Abnormal   Collection Time: 09/27/23  7:15 AM  Result Value Ref Range   Glucose-Capillary 152 (H) 70 - 99 mg/dL    Comment: Glucose reference range applies only to samples taken after fasting for at least 8 hours.  Glucose, capillary     Status: Abnormal   Collection Time: 09/27/23 11:40 AM  Result Value Ref Range   Glucose-Capillary 133 (H) 70 - 99 mg/dL    Comment: Glucose  reference range applies only to samples taken after fasting for at least 8 hours.  Glucose, capillary     Status: Abnormal   Collection Time: 09/27/23  4:38 PM  Result Value Ref Range   Glucose-Capillary 104 (H) 70 - 99 mg/dL    Comment: Glucose reference range applies only to samples taken after fasting for at least 8 hours.  Glucose, capillary  Status: Abnormal   Collection Time: 09/27/23  7:19 PM  Result Value Ref Range   Glucose-Capillary 134 (H) 70 - 99 mg/dL    Comment: Glucose reference range applies only to samples taken after fasting for at least 8 hours.  Glucose, capillary     Status: Abnormal   Collection Time: 09/28/23  7:45 AM  Result Value Ref Range   Glucose-Capillary 112 (H) 70 - 99 mg/dL    Comment: Glucose reference range applies only to samples taken after fasting for at least 8 hours.  Glucose, capillary     Status: Abnormal   Collection Time: 09/28/23 11:37 AM  Result Value Ref Range   Glucose-Capillary 152 (H) 70 - 99 mg/dL    Comment: Glucose reference range applies only to samples taken after fasting for at least 8 hours.  Glucose, capillary     Status: Abnormal   Collection Time: 09/28/23  4:33 PM  Result Value Ref Range   Glucose-Capillary 124 (H) 70 - 99 mg/dL    Comment: Glucose reference range applies only to samples taken after fasting for at least 8 hours.    Blood Alcohol level:  Lab Results  Component Value Date   ETH <10 09/12/2023   ETH <10 02/26/2022    Metabolic Disorder Labs: Lab Results  Component Value Date   HGBA1C 7.2 (H) 09/12/2023   MPG 159.94 09/12/2023   MPG 191.51 03/06/2022   Lab Results  Component Value Date   PROLACTIN 23.3 02/20/2017   PROLACTIN 3.6 (L) 07/31/2016   Lab Results  Component Value Date   CHOL 194 03/06/2022   TRIG 113 03/06/2022   HDL 63 03/06/2022   CHOLHDL 3.1 03/06/2022   VLDL 23 03/06/2022   LDLCALC 108 (H) 03/06/2022   LDLCALC 131 (H) 02/20/2017    Physical Findings: AIMS:  , ,  ,  ,     CIWA:    COWS:      Psychiatric Specialty Exam:  Presentation  General Appearance:  Casual, improved Eye Contact: Fair Speech: Normal rate rhythm Speech Volume: Improved   Mood and Affect  Mood:fine Affect: flat  Thought Process  Thought Processes:linear improved Descriptions of Associations: None reported Orientation: X 3 Thought Content: Linear Hallucinations:denies  Ideas of Reference: None  Suicidal Thoughts:denies  Homicidal Thoughts:denies   Sensorium  Memory: Fair Judgment: Fair  Insight: Fair   Art therapist  Concentration: Fair  Attention Span: Improved Recall: Improved Fund of Knowledge: Improved Language: Improved  Psychomotor Activity  slow  Musculoskeletal: Strength & Muscle Tone: within normal limits Gait & Station: normal Assets  Assets: Manufacturing systems engineer; Desire for Improvement; Physical Health    Physical Exam: Physical Exam Vitals and nursing note reviewed.    Review of Systems  Unable to perform ROS: Psychiatric disorder   Blood pressure 130/86, pulse (!) 101, temperature 98.4 F (36.9 C), resp. rate 18, height 5\' 2"  (1.575 m), weight 71.4 kg, SpO2 99%. Body mass index is 28.81 kg/m.  Diagnosis: Principal Problem:   Schizoaffective disorder, bipolar type (HCC) Active Problems:   Auditory hallucinations  Clinical Decision Making: Patient with history of schizoaffective disorder currently noncompliant with medication, displaying significant amount of disorganized behavior, leaving the house and wandering/driving on the streets aimlessly, endorsing command type auditory hallucinations telling her to kill herself, lacks insight and refusing treatment.  Patient admitted to geropsych unit on IVC for further stabilization.  Since admission patient showed gradual improvement in her mood and psychosis with Abilify .  Loxapine is added a  second antipsychotic which help patient's hallucinations to disappear.   Patient tolerated medications with no reported side effects.   Treatment Plan Summary:   Safety and Monitoring:             -- Involuntary admission to inpatient psychiatric unit for safety, stabilization and treatment             -- Daily contact with patient to assess and evaluate symptoms and progress in treatment             -- Patient's case to be discussed in multi-disciplinary team meeting             -- Observation Level: q15 minute checks             -- Vital signs:  q12 hours             -- Precautions: suicide, elopement, and assault   2. Psychiatric Diagnoses and Treatment:  Added second antipsychotic to help with residual psychotic sx that are impairing her function- Loxapine 25 mg adjusted to at bedtime to minimize day time sedation            Abilify  10 mg BID  Abilify  maintaina 400 mg IM/ on 4 /25/25  Trileptal  300mg  bID for mood stabilization      -- The risks/benefits/side-effects/alternatives to this medication were discussed in detail with the patient and time was given for questions. The patient consents to medication trial.                -- Metabolic profile and EKG monitoring obtained while on an atypical antipsychotic (BMI: Lipid Panel: HbgA1c: QTc:)              -- Encouraged patient to participate in unit milieu and in scheduled group therapies                            3. Medical Issues Being Addressed:  Hospitalist consult is made given her blood sugars more than 550 and her insulin  is adjusted   4. Discharge Planning:              -- Social work and case management to assist with discharge planning and identification of hospital follow-up needs prior to discharge             -- Estimated LOS: 1-2 days             -- Discharge Concerns: Need to establish a safety plan; Medication compliance and effectiveness             -- Discharge Goals: Return home with outpatient referrals follow ups   Physician Treatment Plan for Primary Diagnosis: Schizoaffective  disorder, bipolar type (HCC) Long Term Goal(s): Improvement in symptoms so as ready for discharge   Short Term Goals: Ability to identify changes in lifestyle to reduce recurrence of condition will improve, Ability to verbalize feelings will improve, Ability to disclose and discuss suicidal ideas, Ability to demonstrate self-control will improve, and Ability to identify and develop effective coping behaviors will improve   Physician Treatment Plan for Secondary Diagnosis: Principal Problem:   Schizoaffective disorder, bipolar type (HCC) Active Problems:   Auditory hallucinations   Long Term Goal(s): Improvement in symptoms so as ready for discharge   Short Term Goals: Ability to identify changes in lifestyle to reduce recurrence of condition will improve, Ability to verbalize feelings will improve, Ability to disclose and discuss suicidal ideas, Ability to  demonstrate self-control will improve, and Ability to identify and develop effective coping behaviors will improve  Aurelia Blotter, MD 09/28/2023, 8:07 PM

## 2023-09-28 NOTE — BHH Suicide Risk Assessment (Signed)
 [facility] Discharge Suicide Risk Assessment   Principal Problem: Schizoaffective disorder, bipolar type [facility]) Discharge Diagnoses: Principal Problem:   Schizoaffective disorder, bipolar type (HCC) Active Problems:   Auditory hallucinations   Total Time spent with patient: 30 minutes  Musculoskeletal: Strength & Muscle Tone: within normal limits Gait & Station: normal Patient leans: N/A  Psychiatric Specialty Exam  Presentation  General Appearance:  Appropriate for Environment; Casual  Eye Contact: Fair  Speech: Clear and Coherent  Speech Volume: Normal  Handedness: Right   Mood and Affect  Mood: Euthymic  Duration of Depression Symptoms: No data recorded Affect: Flat   Thought Process  Thought Processes: Coherent  Descriptions of Associations:Intact  Orientation:Full (Time, Place and Person)  Thought Content:Logical  History of Schizophrenia/Schizoaffective disorder:No data recorded Duration of Psychotic Symptoms:No data recorded Hallucinations:Hallucinations: None  Ideas of Reference:None  Suicidal Thoughts:Suicidal Thoughts: No  Homicidal Thoughts:Homicidal Thoughts: No   Sensorium  Memory: Immediate Fair; Recent Fair; Remote Fair  Judgment: Fair  Insight: Fair   Art therapist  Concentration: Fair  Attention Span: Fair  Recall: [employer] of Knowledge: Fair  Language: Fair   Psychomotor Activity  Psychomotor Activity: Psychomotor Activity: Normal   Assets  Assets: Communication Skills; Desire for Improvement; Physical Health   Sleep  Sleep: Sleep: Fair   Physical Exam: Physical Exam ROS Blood pressure 131/80, pulse (!) 112, temperature 97.7 F (36.5 C), resp. rate 18, height 5\' 2"  (1.575 m), weight 71.4 kg, SpO2 100%. Body mass index is 28.81 kg/m.  Mental Status Per Nursing Assessment::   On Admission:  Suicidal ideation indicated by others  Demographic Factors:  Caucasian  Loss  Factors: Decrease in vocational status  Historical Factors: Impulsivity  Risk Reduction Factors:   Sense of responsibility to family, Living with another person, especially a relative, Positive social support, Positive therapeutic relationship, and Positive coping skills or problem solving skills  Continued Clinical Symptoms:  Bipolar Disorder:   Mixed State  Cognitive Features That Contribute To Risk:  None    Suicide Risk:  Minimal: No identifiable suicidal ideation.  Patients presenting with no risk factors but with morbid ruminations; may be classified as minimal risk based on the severity of the depressive symptoms   Follow-up Information     [facility] Follow up on 10/12/2023.   Why: Your appointment is scheduled for 10/12/23 at 10:30 AM with Michaelyn Adu at [place]. Contact information: [address] [place] [address] [phone]                 Plan Of Care/Follow-up recommendations:  Activity:  As tolerated  Aurelia Blotter, MD 09/28/2023, 2:26 PM

## 2023-09-28 NOTE — Group Note (Signed)
 Date:  09/28/2023 Time:  10:50 AM  Group Topic/Focus:  Goals/meditation Group:   The focus of this group is to help patients establish daily goals to achieve during treatment and discuss how the patient can incorporate goal setting into their daily lives to aide in recovery.    Participation Level:  Active  Participation Quality:  Appropriate  Affect:  Appropriate  Cognitive:  Appropriate  Insight: Appropriate  Engagement in Group:  Engaged  Modes of Intervention:  Activity and Discussion  Additional Comments:    Linnell Richardson 09/28/2023, 10:50 AM

## 2023-09-28 NOTE — Group Note (Signed)
 Recreation Therapy Group Note   Group Topic:Health and Wellness  Group Date: 09/28/2023 Start Time: 1400 End Time: 1450 Facilitators: Deatrice Factor, LRT, CTRS Location:  Dayroom  Activity Description/Intervention: Therapeutic Drumming. Patients with peers and staff were given the opportunity to engage in a leader facilitated HealthRHYTHMS Group Empowerment Drumming Circle with staff from the [employer], in partnership with [employer]. Teaching laboratory technician and trained [employer], Kathlyne Parchment leading with LRT observing and documenting intervention and pt response. This evidenced-based practice targets 7 areas of health and wellbeing in the human experience including: stress-reduction, exercise, self-expression, camaraderie/support, nurturing, spirituality, and music-making (leisure).    Goal Area(s) Addresses:  Patient will engage in pro-social way in music group.  Patient will follow directions of drum leader on the first prompt. Patient will demonstrate no behavioral issues during group.  Patient will identify if a reduction in stress level occurs as a result of participation in therapeutic drum circle.     Education: Leisure exposure, Pharmacologist, Musical expression, Discharge Planning   Affect/Mood: Appropriate   Participation Level: Active   Participation Quality: Independent   Behavior: Appropriate   Speech/Thought Process: Coherent   Insight: Good   Judgement: Good   Modes of Intervention: Music   Patient Response to Interventions:  Receptive   Education Outcome:  Acknowledges education   Clinical Observations/Individualized Feedback: Audrey was active in their participation of session activities and group discussion. Pt interacted well with LRT and peers duration of session.    Plan: Continue to engage patient in RT group sessions 2-3x/week.   Deatrice Factor, LRT, CTRS 09/28/2023 4:38 PM

## 2023-09-28 NOTE — Plan of Care (Signed)
   Problem: Fluid Volume: Goal: Ability to maintain a balanced intake and output will improve Outcome: Progressing   Problem: Health Behavior/Discharge Planning: Goal: Ability to identify and utilize available resources and services will improve Outcome: Progressing Goal: Ability to manage health-related needs will improve Outcome: Progressing

## 2023-09-28 NOTE — Plan of Care (Signed)
  Problem: Education: Goal: Ability to make informed decisions regarding treatment will improve Outcome: Adequate for Discharge   Problem: Coping: Goal: Coping ability will improve Outcome: Adequate for Discharge

## 2023-09-29 DIAGNOSIS — F250 Schizoaffective disorder, bipolar type: Secondary | ICD-10-CM | POA: Diagnosis not present

## 2023-09-29 LAB — GLUCOSE, CAPILLARY: Glucose-Capillary: 137 mg/dL — ABNORMAL HIGH (ref 70–99)

## 2023-09-29 NOTE — Group Note (Signed)
 Date:  09/29/2023 Time:  11:11 AM  Group Topic/Focus:  Healthy Communication:   The focus of this group is to discuss communication, barriers to communication, as well as healthy ways to communicate with others.    Participation Level:  Did Not Attend   Marianna Shirk Idara Woodside 09/29/2023, 11:11 AM

## 2023-09-29 NOTE — BHH Counselor (Signed)
 CSW gave pt blank IM due to IM not being signed at admission because of pt's altered mental status.   Derrill Flirt, MSW, [place] 09/29/2023 9:40 AM

## 2023-09-29 NOTE — Plan of Care (Signed)
  Problem: Coping: Goal: Ability to adjust to condition or change in health will improve Outcome: Progressing   Problem: Coping: Goal: Coping ability will improve Outcome: Progressing   Problem: Activity: Goal: Will verbalize the importance of balancing activity with adequate rest periods Outcome: Progressing   Problem: Self-Care: Goal: Ability to participate in self-care as condition permits will improve Outcome: Progressing   Problem: Self-Concept: Goal: Will verbalize positive feelings about self Outcome: Progressing

## 2023-09-29 NOTE — BHH Suicide Risk Assessment (Signed)
 [facility] Discharge Suicide Risk Assessment   Principal Problem: Schizoaffective disorder, bipolar type [facility]) Discharge Diagnoses: Principal Problem:   Schizoaffective disorder, bipolar type (HCC) Active Problems:   Auditory hallucinations   Total Time spent with patient: 30 minutes  Musculoskeletal: Strength & Muscle Tone: within normal limits Gait & Station: normal Patient leans: N/A  Psychiatric Specialty Exam  Presentation  General Appearance:  Appropriate for Environment; Casual  Eye Contact: Fair  Speech: Clear and Coherent  Speech Volume: Normal  Handedness: Right   Mood and Affect  Mood: Euthymic  Duration of Depression Symptoms: No data recorded Affect: Appropriate   Thought Process  Thought Processes: Coherent  Descriptions of Associations:Intact  Orientation:Full (Time, Place and Person)  Thought Content:Logical  History of Schizophrenia/Schizoaffective disorder:No data recorded Duration of Psychotic Symptoms:No data recorded Hallucinations:Hallucinations: None  Ideas of Reference:None  Suicidal Thoughts:Suicidal Thoughts: No  Homicidal Thoughts:Homicidal Thoughts: No   Sensorium  Memory: Immediate Fair; Recent Fair; Remote Poor  Judgment: Fair  Insight: Fair   Chartered certified accountant: Fair  Attention Span: Fair  Recall: [employer] of Knowledge: Fair  Language: Fair   Psychomotor Activity  Psychomotor Activity: Psychomotor Activity: Normal   Assets  Assets: Communication Skills; Desire for Improvement; Resilience; Social Support   Sleep  Sleep: Sleep: Fair   Physical Exam: Physical Exam ROS Blood pressure 97/80, pulse (!) 113, temperature (!) 97 F (36.1 C), resp. rate 18, height 5\' 2"  (1.575 m), weight 71.4 kg, SpO2 99%. Body mass index is 28.81 kg/m.  Mental Status Per Nursing Assessment::   On Admission:  Suicidal ideation indicated by others  Demographic Factors:   Caucasian  Loss Factors: Decrease in vocational status  Historical Factors: NA  Risk Reduction Factors:   Sense of responsibility to family, Religious beliefs about death, Living with another person, especially a relative, Positive social support, Positive therapeutic relationship, and Positive coping skills or problem solving skills  Continued Clinical Symptoms:  Schizophrenia:   Paranoid or undifferentiated type  Cognitive Features That Contribute To Risk:  None    Suicide Risk:  Minimal: No identifiable suicidal ideation.  Patients presenting with no risk factors but with morbid ruminations; may be classified as minimal risk based on the severity of the depressive symptoms   Follow-up Information     [facility] [place] [facility] Follow up on 10/12/2023.   Why: Your appointment is scheduled for 10/12/23 at 10:30 AM with Michaelyn Adu at [place]. Contact information: [address] [place] [address] [phone]                 Plan Of Care/Follow-up recommendations:  Activity:  As tolerated  Zamere Pasternak, MD 09/29/2023, 11:04 AM

## 2023-09-29 NOTE — Discharge Summary (Signed)
 Physician Discharge Summary Note  Patient:  Rita Sullivan is an 68 y.o., female MRN:  [id number] DOB:  07-21-1955 Patient phone:  [phone] (home)  Patient address:   [address] [place] [address],    Date of Admission:  09/14/2023 Date of Discharge: 09/29/23  Reason for Admission:  Patient is a 68 year old female with history of schizoaffective disorder, bipolar type presented with command auditory hallucinations telling her to kill self. Patient currently admitted to geropsych unit for further stabilization   Principal Problem: Schizoaffective disorder, bipolar type [facility]) Discharge Diagnoses: Principal Problem:   Schizoaffective disorder, bipolar type (HCC) Active Problems:   Auditory hallucinations   Past Psychiatric History: see h&p  Family Psychiatric  History: see h&p Social History:  Social History   Substance and Sexual Activity  Alcohol Use Yes   Alcohol/week: 2.0 standard drinks of alcohol   Types: 2 Glasses of wine per week   Comment: Occ     Social History   Substance and Sexual Activity  Drug Use No    Social History   Socioeconomic History   Marital status: Married    Spouse name: Not on file   Number of children: Not on file   Years of education: Not on file   Highest education level: Not on file  Occupational History   Not on file  Tobacco Use   Smoking status: Never   Smokeless tobacco: Never  Vaping Use   Vaping status: Not on file  Substance and Sexual Activity   Alcohol use: Yes    Alcohol/week: 2.0 standard drinks of alcohol    Types: 2 Glasses of wine per week    Comment: Occ   Drug use: No   Sexual activity: Never  Other Topics Concern   Not on file  Social History Narrative   Not on file   Social Drivers of Health   Financial Resource Strain: Low Risk  (02/23/2023)   Received from [facility]   Overall Financial Resource Strain (CARDIA)    Difficulty of Paying Living Expenses: Not very hard   Food Insecurity: No Food Insecurity (09/14/2023)   Hunger Vital Sign    Worried About Running Out of Food in the Last Year: Never true    Ran Out of Food in the Last Year: Never true  Transportation Needs: No Transportation Needs (09/14/2023)   PRAPARE - Administrator, Civil Service (Medical): No    Lack of Transportation (Non-Medical): No  Physical Activity: Not on file  Stress: Not on file  Social Connections: Socially Isolated (09/14/2023)   Social Connection and Isolation Panel [NHANES]    Frequency of Communication with Friends and Family: Never    Frequency of Social Gatherings with Friends and Family: Never    Attends Religious Services: Never    Database administrator or Organizations: No    Attends Engineer, structural: Never    Marital Status: Married   Past Medical History:  Past Medical History:  Diagnosis Date   Bipolar disorder (HCC)    Chronic kidney disease    Diabetes mellitus without complication (HCC)    Hypercholesteremia    Manic behavior (HCC)     Past Surgical History:  Procedure Laterality Date   COLONOSCOPY WITH PROPOFOL  N/A 03/11/2015   Procedure: COLONOSCOPY WITH PROPOFOL ;  Surgeon: Stephens Eis, MD;  Location: [facility] ENDOSCOPY;  Service: Gastroenterology;  Laterality: N/A;   Family History:  Family History  Problem Relation Age of Onset  Diabetes Father    Diabetes Sister     Hospital Course:  Patient is a 68 year old female with history of schizoaffective disorder, bipolar type presented with command auditory hallucinations telling her to kill self. Patient currently admitted to geropsych unit for further stabilization.  On admission patient was started on Abilify  5 mg twice daily titrated slowly to 10 mg twice daily.  Patient tolerated the medication fairly well and agreed for Abilify  maintainer.  100 mg LAI.  Patient tolerated the LAI fairly well with no reported EPS.  As patient continued to display residual psychotic symptoms,  disorganized behavior loxapine was added to the regimen.  Patient tolerated doxepin 25 mg with no reported side effects.  Patient psychosis improved and is able to engage in linear conversations.  She started engaging in self-care.  She consistently maintained safe behaviors on the unit and denied SI/HI.  Multiple discussions were held with patient's family including her husband about transition of care, ACT team referral was made.  On the day of discharge patient denies SI/HI/plan and hallucinations.  She remained future oriented and is willing to participate in outpatient mental health services.  Physical Findings: AIMS:  , ,  ,  ,    CIWA:    COWS:        Psychiatric Specialty Exam:  Presentation  General Appearance:  Appropriate for Environment; Casual  Eye Contact: Fair  Speech: Clear and Coherent  Speech Volume: Normal    Mood and Affect  Mood: Euthymic  Affect: Appropriate   Thought Process  Thought Processes: Coherent  Descriptions of Associations:Intact  Orientation:Full (Time, Place and Person)  Thought Content:Logical  Hallucinations:Hallucinations: None  Ideas of Reference:None  Suicidal Thoughts:Suicidal Thoughts: No  Homicidal Thoughts:Homicidal Thoughts: No   Sensorium  Memory: Immediate Fair; Recent Fair; Remote Poor  Judgment: Fair  Insight: Fair   Chartered certified accountant: Fair  Attention Span: Fair  Recall: [employer] of Knowledge: Fair  Language: Fair   Psychomotor Activity  Psychomotor Activity: Psychomotor Activity: Normal  Musculoskeletal: Strength & Muscle Tone: within normal limits Gait & Station: normal Assets  Assets: Manufacturing systems engineer; Desire for Improvement; Resilience; Social Support   Sleep  Sleep: Sleep: Fair    Physical Exam: Physical Exam ROS Blood pressure 97/80, pulse (!) 113, temperature (!) 97 F (36.1 C), resp. rate 18, height 5\' 2"  (1.575 m), weight 71.4 kg,  SpO2 99%. Body mass index is 28.81 kg/m.   Social History   Tobacco Use  Smoking Status Never  Smokeless Tobacco Never   Tobacco Cessation:  N/A, patient does not currently use tobacco products   Blood Alcohol level:  Lab Results  Component Value Date   ETH <10 09/12/2023   ETH <10 02/26/2022    Metabolic Disorder Labs:  Lab Results  Component Value Date   HGBA1C 7.2 (H) 09/12/2023   MPG 159.94 09/12/2023   MPG 191.51 03/06/2022   Lab Results  Component Value Date   PROLACTIN 23.3 02/20/2017   PROLACTIN 3.6 (L) 07/31/2016   Lab Results  Component Value Date   CHOL 194 03/06/2022   TRIG 113 03/06/2022   HDL 63 03/06/2022   CHOLHDL 3.1 03/06/2022   VLDL 23 03/06/2022   LDLCALC 108 (H) 03/06/2022   LDLCALC 131 (H) 02/20/2017    See Psychiatric Specialty Exam and Suicide Risk Assessment completed by Attending Physician prior to discharge.  Discharge destination:  Home  Is patient on multiple antipsychotic therapies at discharge:  No   Has Patient  had three or more failed trials of antipsychotic monotherapy by history:  No  Recommended Plan for Multiple Antipsychotic Therapies: NA  Discharge Instructions     Diet - low sodium heart healthy   Complete by: As directed    Increase activity slowly   Complete by: As directed       Allergies as of 09/29/2023       Reactions   Beef-derived Drug Products Other (See Comments)   Does not eat beef  Update: Pt states that she does eat beef   Divalproex Sodium Other (See Comments)   "bad for my liver"   Pork-derived Products Other (See Comments)   Does not eat pork Patient states that she does eat pork   Penicillins Rash        Medication List     STOP taking these medications    carbamazepine  200 MG tablet Commonly known as: TEGRETOL    glipiZIDE  5 MG tablet Commonly known as: GLUCOTROL    Januvia 50 MG tablet Generic drug: sitaGLIPtin       TAKE these medications      Indication   ARIPiprazole  10 MG tablet Commonly known as: ABILIFY  Take 1 tablet (10 mg total) by mouth 2 (two) times daily. What changed:  medication strength how much to take when to take this    atorvastatin  40 MG tablet Commonly known as: LIPITOR Take 1 tablet (40 mg total) by mouth daily.  Indication: High Amount of Fats in the Blood   cholecalciferol  25 MCG (1000 UNIT) tablet Commonly known as: VITAMIN D3 Take 2 tablets (2,000 Units total) by mouth daily.  Indication: Vitamin D  Deficiency   folic acid  1 MG tablet Commonly known as: FOLVITE  Take 0.5 tablets (0.5 mg total) by mouth daily.  Indication: Anemia From Inadequate Folic Acid    insulin  aspart 100 UNIT/ML injection Commonly known as: novoLOG  Inject 0-15 Units into the skin 3 (three) times daily with meals.    insulin  glargine-yfgn 100 UNIT/ML injection Commonly known as: SEMGLEE  Inject 0.23 mLs (23 Units total) into the skin 2 (two) times daily. What changed:  how much to take when to take this  Indication: Type 2 Diabetes   linagliptin  5 MG Tabs tablet Commonly known as: TRADJENTA  Take 1 tablet (5 mg total) by mouth daily.    losartan  25 MG tablet Commonly known as: COZAAR  Take 1 tablet (25 mg total) by mouth daily.  Indication: High Blood Pressure   loxapine 25 MG capsule Commonly known as: LOXITANE Take 1 capsule (25 mg total) by mouth at bedtime.    metFORMIN  1000 MG tablet Commonly known as: GLUCOPHAGE  Take 1 tablet (1,000 mg total) by mouth 2 (two) times daily with a meal. What changed: medication strength    Oxcarbazepine  300 MG tablet Commonly known as: TRILEPTAL  Take 1 tablet (300 mg total) by mouth 2 (two) times daily.    traZODone  100 MG tablet Commonly known as: DESYREL  Take 1 tablet (100 mg total) by mouth at bedtime.  Indication: Trouble Sleeping        Follow-up Information     [facility] Follow up on 10/12/2023.   Why: Your appointment is  scheduled for 10/12/23 at 10:30 AM with Michaelyn Adu at [place]. Contact information: [address] [place] [address] [phone]                 Follow-up recommendations:  Activity:  As tolerated    Signed: Toree Edling, MD  09/29/2023, 11:32 AM

## 2023-09-29 NOTE — Progress Notes (Signed)
  [facility] Adult Case Management Discharge Plan :  Will you be returning to the same living situation after discharge:  Yes,  pt will return home  At discharge, do you have transportation home?: Yes,  pt's husband will pick her up  Do you have the ability to pay for your medications: Yes,  [employer] MEDICARE / AETNA MEDICARE HMO/PPO  Release of information consent forms completed and in the chart;  Patient's signature needed at discharge.  Patient to Follow up at:  Follow-up Information     [facility] Follow up on 10/12/2023.   Why: Your appointment is scheduled for 10/12/23 at 10:30 AM with Michaelyn Adu at [place]. Contact information: [address] [place] [address] [phone]                 Next level of care provider has access to [facility] Link:no  Safety Planning and Suicide Prevention discussed: Yes,  CSW went over SPE brochure with pt at discharge      Has patient been referred to the Quitline?: Patient does not use tobacco/nicotine products  Patient has been referred for addiction treatment: No known substance use disorder.  [address], LCSWA 09/29/2023, 9:38 AM

## 2023-09-29 NOTE — Care Management Important Message (Signed)
 Important Message  Patient Details  Name: Rita Sullivan MRN: [id number] Date of Birth: 09/16/1955   Important Message Given:  Yes - Medicare IM     Claudio Culver, LCSWA 09/29/2023, 9:43 AM

## 2023-09-29 NOTE — Plan of Care (Addendum)
 Patient is scheduled for discharge today. Patient states her husband will be coming to pick her up.  Denies SI, HI, AVH, anxiety and depression.  Will continue to monitor.  1215pm - patient escorted to the exit with paperwork.  Patient had no belongings so was given donated tshirt and jeans for discharge. Husband in lobby to drive patient home.

## 2023-09-29 NOTE — Group Note (Signed)
 Date:  09/29/2023 Time:  1:35 AM  Group Topic/Focus:  Self Care:   The focus of this group is to help patients understand the importance of self-care in order to improve or restore emotional, physical, spiritual, interpersonal, and financial health.    Participation Level:  Did Not Attend  Participation Quality:   Did Not Attend  Affect:   Did Not Attend  Cognitive:   Did Not Attend  Insight: None  Engagement in Group:  None  Modes of Intervention:  Education  Additional Comments:    Sherlie Distance 09/29/2023, 1:35 AM

## 2024-03-02 ENCOUNTER — Other Ambulatory Visit: Payer: Self-pay | Admitting: Internal Medicine

## 2024-03-02 DIAGNOSIS — Z1231 Encounter for screening mammogram for malignant neoplasm of breast: Secondary | ICD-10-CM

## 2024-04-04 ENCOUNTER — Ambulatory Visit
Admission: RE | Admit: 2024-04-04 | Discharge: 2024-04-04 | Disposition: A | Discharge status: Home / Self Care | Source: Ambulatory Visit | Attending: Internal Medicine | Admitting: Internal Medicine

## 2024-04-04 DIAGNOSIS — Z1231 Encounter for screening mammogram for malignant neoplasm of breast: Secondary | ICD-10-CM | POA: Diagnosis present

## 2024-04-05 ENCOUNTER — Inpatient Hospital Stay
Admission: RE | Admit: 2024-04-05 | Discharge: 2024-04-05 | Disposition: A | Discharge status: Home / Self Care | Payer: Self-pay | Source: Ambulatory Visit | Attending: Internal Medicine | Admitting: Internal Medicine

## 2024-04-05 ENCOUNTER — Other Ambulatory Visit: Payer: Self-pay | Admitting: *Deleted

## 2024-04-05 DIAGNOSIS — Z1231 Encounter for screening mammogram for malignant neoplasm of breast: Secondary | ICD-10-CM
